# Patient Record
Sex: Male | Born: 1970 | Race: White | Hispanic: No | Marital: Single | Smoking: Current every day smoker
Health system: Southern US, Community
[De-identification: ages and names within clinical notes are randomized; demographics above are authoritative.]

## PROBLEM LIST (undated history)

## (undated) DIAGNOSIS — J45909 Unspecified asthma, uncomplicated: Secondary | ICD-10-CM

---

## 1998-03-03 ENCOUNTER — Emergency Department (HOSPITAL_COMMUNITY): Admission: EM | Admit: 1998-03-03 | Discharge: 1998-03-03 | Payer: Self-pay | Admitting: Emergency Medicine

## 2021-05-21 ENCOUNTER — Inpatient Hospital Stay (HOSPITAL_COMMUNITY)
Admission: EM | Admit: 2021-05-21 | Discharge: 2021-05-27 | DRG: 683 | Disposition: A | Discharge status: Home / Self Care | Payer: BLUE CROSS/BLUE SHIELD | Attending: Internal Medicine | Admitting: Internal Medicine

## 2021-05-21 ENCOUNTER — Encounter (HOSPITAL_COMMUNITY): Payer: Self-pay | Admitting: Emergency Medicine

## 2021-05-21 ENCOUNTER — Emergency Department (HOSPITAL_COMMUNITY): Payer: BLUE CROSS/BLUE SHIELD

## 2021-05-21 ENCOUNTER — Other Ambulatory Visit: Payer: Self-pay

## 2021-05-21 DIAGNOSIS — K5732 Diverticulitis of large intestine without perforation or abscess without bleeding: Secondary | ICD-10-CM | POA: Diagnosis present

## 2021-05-21 DIAGNOSIS — D696 Thrombocytopenia, unspecified: Secondary | ICD-10-CM | POA: Diagnosis not present

## 2021-05-21 DIAGNOSIS — Z720 Tobacco use: Secondary | ICD-10-CM | POA: Diagnosis present

## 2021-05-21 DIAGNOSIS — Z8619 Personal history of other infectious and parasitic diseases: Secondary | ICD-10-CM | POA: Diagnosis not present

## 2021-05-21 DIAGNOSIS — M899 Disorder of bone, unspecified: Secondary | ICD-10-CM | POA: Diagnosis present

## 2021-05-21 DIAGNOSIS — E860 Dehydration: Secondary | ICD-10-CM | POA: Diagnosis present

## 2021-05-21 DIAGNOSIS — R809 Proteinuria, unspecified: Secondary | ICD-10-CM | POA: Diagnosis present

## 2021-05-21 DIAGNOSIS — R1013 Epigastric pain: Secondary | ICD-10-CM

## 2021-05-21 DIAGNOSIS — K5900 Constipation, unspecified: Secondary | ICD-10-CM | POA: Diagnosis present

## 2021-05-21 DIAGNOSIS — D6959 Other secondary thrombocytopenia: Secondary | ICD-10-CM | POA: Diagnosis present

## 2021-05-21 DIAGNOSIS — D649 Anemia, unspecified: Secondary | ICD-10-CM | POA: Diagnosis present

## 2021-05-21 DIAGNOSIS — N179 Acute kidney failure, unspecified: Principal | ICD-10-CM | POA: Diagnosis present

## 2021-05-21 DIAGNOSIS — J45909 Unspecified asthma, uncomplicated: Secondary | ICD-10-CM | POA: Diagnosis present

## 2021-05-21 DIAGNOSIS — F17210 Nicotine dependence, cigarettes, uncomplicated: Secondary | ICD-10-CM | POA: Diagnosis present

## 2021-05-21 DIAGNOSIS — Z9181 History of falling: Secondary | ICD-10-CM

## 2021-05-21 DIAGNOSIS — N19 Unspecified kidney failure: Secondary | ICD-10-CM | POA: Diagnosis not present

## 2021-05-21 DIAGNOSIS — K5792 Diverticulitis of intestine, part unspecified, without perforation or abscess without bleeding: Secondary | ICD-10-CM | POA: Diagnosis present

## 2021-05-21 DIAGNOSIS — Z20822 Contact with and (suspected) exposure to covid-19: Secondary | ICD-10-CM | POA: Diagnosis present

## 2021-05-21 DIAGNOSIS — R823 Hemoglobinuria: Secondary | ICD-10-CM | POA: Diagnosis present

## 2021-05-21 DIAGNOSIS — E8809 Other disorders of plasma-protein metabolism, not elsewhere classified: Secondary | ICD-10-CM | POA: Diagnosis present

## 2021-05-21 DIAGNOSIS — D631 Anemia in chronic kidney disease: Secondary | ICD-10-CM | POA: Diagnosis not present

## 2021-05-21 DIAGNOSIS — C9000 Multiple myeloma not having achieved remission: Secondary | ICD-10-CM | POA: Diagnosis present

## 2021-05-21 DIAGNOSIS — M549 Dorsalgia, unspecified: Secondary | ICD-10-CM | POA: Diagnosis present

## 2021-05-21 DIAGNOSIS — Z7189 Other specified counseling: Secondary | ICD-10-CM

## 2021-05-21 DIAGNOSIS — M8950 Osteolysis, unspecified site: Secondary | ICD-10-CM | POA: Diagnosis not present

## 2021-05-21 HISTORY — DX: Unspecified asthma, uncomplicated: J45.909

## 2021-05-21 LAB — CBC
HCT: 36.1 % — ABNORMAL LOW (ref 39.0–52.0)
Hemoglobin: 12.6 g/dL — ABNORMAL LOW (ref 13.0–17.0)
MCH: 32.4 pg (ref 26.0–34.0)
MCHC: 34.9 g/dL (ref 30.0–36.0)
MCV: 92.8 fL (ref 80.0–100.0)
Platelets: UNDETERMINED 10*3/uL (ref 150–400)
RBC: 3.89 MIL/uL — ABNORMAL LOW (ref 4.22–5.81)
RDW: 15 % (ref 11.5–15.5)
WBC: 12.9 10*3/uL — ABNORMAL HIGH (ref 4.0–10.5)
nRBC: 0 % (ref 0.0–0.2)

## 2021-05-21 LAB — PHOSPHORUS: Phosphorus: 5.3 mg/dL — ABNORMAL HIGH (ref 2.5–4.6)

## 2021-05-21 LAB — CK: Total CK: 118 U/L (ref 49–397)

## 2021-05-21 LAB — DIFFERENTIAL
Abs Immature Granulocytes: 0.1 10*3/uL — ABNORMAL HIGH (ref 0.00–0.07)
Basophils Absolute: 0.1 10*3/uL (ref 0.0–0.1)
Basophils Relative: 1 %
Eosinophils Absolute: 0.6 10*3/uL — ABNORMAL HIGH (ref 0.0–0.5)
Eosinophils Relative: 5 %
Lymphocytes Relative: 38 %
Lymphs Abs: 4.9 10*3/uL — ABNORMAL HIGH (ref 0.7–4.0)
Monocytes Absolute: 0.4 10*3/uL (ref 0.1–1.0)
Monocytes Relative: 3 %
Neutro Abs: 6.7 10*3/uL (ref 1.7–7.7)
Neutrophils Relative %: 52 %
Promyelocytes Relative: 1 %
nRBC: 0 /100 WBC

## 2021-05-21 LAB — URINALYSIS, ROUTINE W REFLEX MICROSCOPIC
Bacteria, UA: NONE SEEN
Bilirubin Urine: NEGATIVE
Glucose, UA: NEGATIVE mg/dL
Ketones, ur: NEGATIVE mg/dL
Leukocytes,Ua: NEGATIVE
Nitrite: NEGATIVE
Protein, ur: 30 mg/dL — AB
Specific Gravity, Urine: 1.017 (ref 1.005–1.030)
pH: 6 (ref 5.0–8.0)

## 2021-05-21 LAB — HEPATIC FUNCTION PANEL
ALT: 20 U/L (ref 0–44)
AST: 16 U/L (ref 15–41)
Albumin: 2.9 g/dL — ABNORMAL LOW (ref 3.5–5.0)
Alkaline Phosphatase: 68 U/L (ref 38–126)
Bilirubin, Direct: <0.1 mg/dL (ref 0.0–0.2)
Total Bilirubin: 0.4 mg/dL (ref 0.3–1.2)
Total Protein: 5.8 g/dL — ABNORMAL LOW (ref 6.5–8.1)

## 2021-05-21 LAB — BASIC METABOLIC PANEL
Anion gap: 11 (ref 5–15)
BUN: 48 mg/dL — ABNORMAL HIGH (ref 6–20)
CO2: 24 mmol/L (ref 22–32)
Calcium: 11.9 mg/dL — ABNORMAL HIGH (ref 8.9–10.3)
Chloride: 100 mmol/L (ref 98–111)
Creatinine, Ser: 4.14 mg/dL — ABNORMAL HIGH (ref 0.61–1.24)
GFR, Estimated: 17 mL/min — ABNORMAL LOW (ref >60)
Glucose, Bld: 119 mg/dL — ABNORMAL HIGH (ref 70–99)
Potassium: 3.8 mmol/L (ref 3.5–5.1)
Sodium: 135 mmol/L (ref 135–145)

## 2021-05-21 LAB — SODIUM, URINE, RANDOM: Sodium, Ur: 65 mmol/L

## 2021-05-21 LAB — CREATININE, URINE, RANDOM: Creatinine, Urine: 71.91 mg/dL

## 2021-05-21 LAB — MAGNESIUM: Magnesium: 2.9 mg/dL — ABNORMAL HIGH (ref 1.7–2.4)

## 2021-05-21 LAB — TROPONIN I (HIGH SENSITIVITY)
Troponin I (High Sensitivity): 7 ng/L (ref <18)
Troponin I (High Sensitivity): 7 ng/L (ref <18)

## 2021-05-21 LAB — RESP PANEL BY RT-PCR (FLU A&B, COVID) ARPGX2
Influenza A by PCR: NEGATIVE
Influenza B by PCR: NEGATIVE
SARS Coronavirus 2 by RT PCR: NEGATIVE

## 2021-05-21 LAB — TSH: TSH: 1.642 u[IU]/mL (ref 0.350–4.500)

## 2021-05-21 LAB — OSMOLALITY, URINE: Osmolality, Ur: 361 mOsm/kg (ref 300–900)

## 2021-05-21 LAB — OSMOLALITY: Osmolality: 298 mOsm/kg — ABNORMAL HIGH (ref 275–295)

## 2021-05-21 LAB — LIPASE, BLOOD: Lipase: 24 U/L (ref 11–51)

## 2021-05-21 MED ORDER — SODIUM CHLORIDE 0.9 % IV BOLUS
1000.0000 mL | Freq: Once | INTRAVENOUS | Status: AC
  Administered 2021-05-21: 1000 mL via INTRAVENOUS

## 2021-05-21 MED ORDER — ALUM & MAG HYDROXIDE-SIMETH 200-200-20 MG/5ML PO SUSP
30.0000 mL | Freq: Once | ORAL | Status: AC
Start: 2021-05-21 — End: 2021-05-21
  Administered 2021-05-21: 30 mL via ORAL
  Filled 2021-05-21: qty 30

## 2021-05-21 MED ORDER — CALCITONIN (SALMON) 200 UNIT/ML IJ SOLN
4.0000 [IU]/kg | Freq: Two times a day (BID) | INTRAMUSCULAR | Status: DC
  Administered 2021-05-22 – 2021-05-23 (×3): 308 [IU] via SUBCUTANEOUS
  Filled 2021-05-21 (×3): qty 1.54

## 2021-05-21 MED ORDER — NICOTINE 14 MG/24HR TD PT24
14.0000 mg | MEDICATED_PATCH | Freq: Every day | TRANSDERMAL | Status: DC
  Administered 2021-05-22 – 2021-05-27 (×6): 14 mg via TRANSDERMAL
  Filled 2021-05-21 (×6): qty 1

## 2021-05-21 MED ORDER — SODIUM CHLORIDE 0.9 % IV SOLN
1.0000 g | Freq: Two times a day (BID) | INTRAVENOUS | Status: DC
  Administered 2021-05-22: 1 g via INTRAVENOUS
  Filled 2021-05-21: qty 10

## 2021-05-21 MED ORDER — MORPHINE SULFATE (PF) 4 MG/ML IV SOLN
4.0000 mg | Freq: Once | INTRAVENOUS | Status: AC
  Administered 2021-05-21: 4 mg via INTRAVENOUS
  Filled 2021-05-21: qty 1

## 2021-05-21 MED ORDER — LIDOCAINE VISCOUS HCL 2 % MT SOLN
15.0000 mL | Freq: Once | OROMUCOSAL | Status: AC
  Administered 2021-05-21: 15 mL via ORAL
  Filled 2021-05-21: qty 15

## 2021-05-21 MED ORDER — SODIUM CHLORIDE 0.9 % IV SOLN: INTRAVENOUS | Status: DC | PRN

## 2021-05-21 MED ORDER — METRONIDAZOLE 500 MG/100ML IV SOLN
500.0000 mg | Freq: Two times a day (BID) | INTRAVENOUS | Status: AC
Start: 2021-05-22 — End: 2021-05-26
  Administered 2021-05-22 – 2021-05-26 (×11): 500 mg via INTRAVENOUS
  Filled 2021-05-21 (×11): qty 100

## 2021-05-21 MED ORDER — FAMOTIDINE IN NACL 20-0.9 MG/50ML-% IV SOLN
20.0000 mg | Freq: Once | INTRAVENOUS | Status: AC
  Administered 2021-05-21: 20 mg via INTRAVENOUS
  Filled 2021-05-21: qty 50

## 2021-05-21 NOTE — Assessment & Plan Note (Addendum)
Rehydrate, start calcitonin for tonight ?Would benefit from further work up including for MM ?Ordered PTH PTHrp ?SPEP. UPEP ?

## 2021-05-21 NOTE — Assessment & Plan Note (Signed)
-   Spoke about importance of quitting spent 5 minutes discussing options for treatment, prior attempts at quitting, and dangers of smoking ?  ? - order nicotine patch  ? - nursing tobacco cessation protocol ? ?

## 2021-05-21 NOTE — Assessment & Plan Note (Signed)
Mild unclear if even symptomatic. ?Cover with antibiotics for tonight and continue to follow ?

## 2021-05-21 NOTE — Assessment & Plan Note (Signed)
Rehydrate with IV fluids follow electrolytes ?

## 2021-05-21 NOTE — ED Triage Notes (Signed)
Pt with multiple complaints.  Reports lower back pain, nausea, vomiting, heartburn, frequent urination with decreased amount of urine since he fell in kitchen on 2/9.  States he was looking under his truck 2 days later on 2/11 and felt something pop in R side of chest and hasn't felt right since.  States he has asthma and hasn't had to use inhalers as much since feeling the "pop".   ?

## 2021-05-21 NOTE — H&P (Signed)
Jordan King YIA:[id number] DOB: [birth year] DOA: 05/21/2021     PCP: Merryl Hacker, No   Outpatient Specialists:  NONE    Patient arrived to ER on 05/21/21 at 1818 Referred by Attending Drenda Freeze, MD   Patient coming from:    home Lives  With SO    Chief Complaint:   Chief Complaint  Patient presents with   Back Pain   Chest Pain   Urinary Frequency    HPI: Jordan King is a 51 y.o. male with medical history significant of  tobacco abuse, asthma    Presented with   multiple complaints 1 m ago he fell in a the kitchen he has been having back pain, foamy urine, blood in his urine  He does not take any regula meds No etoh  Continues to smoke Reports poor appetite and nausea Repeated heart burn has been using TUms on occasion Once tried to eat a up to 8 TUMS did not help with hear burn No blood in stool he is unsure if he lost any wight Has been constipated Has been confused    Initial COVID TEST  NEGATIVE   Lab Results  Component Value Date   [place] NEGATIVE 05/21/2021        While in ER:   Noted to have elevated Ca and AKI   Ordered  CT spine - No fracture Heterogeneous bone marrow with a 6 mm lucency involving the anterior aspect of L3 superior endplate. Findings are nonspecific and may be related to osteopenia. MRI could be considered for further evaluation.  CXR - Inferior endplate deformity in the lower thoracic spine which appears chronic in nature. Correlate to point tenderness.  CTabd/pelvis -  Equivocal faint stranding about a diverticulum involving the descending colon, which may represent mild diverticulitis  Suggestion of mild hepatic steatosis. questionable lucent lesion within the right posterior eleventh rib.  Following Medications were ordered in ER: Medications  morphine (PF) 4 MG/ML injection 4 mg (4 mg Intravenous Given 05/21/21 1952)  famotidine (PEPCID) IVPB 20 mg premix (0 mg Intravenous Stopped 05/21/21 2118)  sodium  chloride 0.9 % bolus 1,000 mL (1,000 mLs Intravenous New Bag/Given 05/21/21 1952)  alum & mag hydroxide-simeth (MAALOX/MYLANTA) 200-200-20 MG/5ML suspension 30 mL (30 mLs Oral Given 05/21/21 1953)    And  lidocaine (XYLOCAINE) 2 % viscous mouth solution 15 mL (15 mLs Oral Given 05/21/21 1953)    _______________________________________________________ ER Provider Called:  Nephrology   Dr.Sanford They Recommend admit to medicine   Will see in AM    ED Triage Vitals  Enc Vitals Group     BP 05/21/21 1827 117/69     Pulse Rate 05/21/21 1827 93     Resp 05/21/21 1827 18     Temp 05/21/21 1827 98.7 F (37.1 C)     Temp Source 05/21/21 1827 Oral     SpO2 05/21/21 1827 99 %     Weight 05/21/21 1855 170 lb (77.1 kg)     Height 05/21/21 1855 '5\' 8"'$  (1.727 m)     Head Circumference --      Peak Flow --      Pain Score 05/21/21 1834 9     Pain Loc --      Pain Edu? --      Excl. in [place]? --   TMAX(24)@     _________________________________________ Significant initial  Findings: Abnormal Labs Reviewed  BASIC METABOLIC PANEL - Abnormal; Notable for the following components:  Result Value   Glucose, Bld 119 (*)    BUN 48 (*)    Creatinine, Ser 4.14 (*)    Calcium 11.9 (*)    GFR, Estimated 17 (*)    All other components within normal limits  CBC - Abnormal; Notable for the following components:   WBC 12.9 (*)    RBC 3.89 (*)    Hemoglobin 12.6 (*)    HCT 36.1 (*)    All other components within normal limits  URINALYSIS, ROUTINE W REFLEX MICROSCOPIC - Abnormal; Notable for the following components:   Hgb urine dipstick MODERATE (*)    Protein, ur 30 (*)    All other components within normal limits  HEPATIC FUNCTION PANEL - Abnormal; Notable for the following components:   Total Protein 5.8 (*)    Albumin 2.9 (*)    All other components within normal limits     _________________________ Troponin 7 ECG: Ordered Personally reviewed by me showing: HR : 86 Rhythm:  NSR    no  evidence of ischemic changes QTC 425    The recent clinical data is shown below. Vitals:   05/21/21 1827 05/21/21 1855 05/21/21 1930 05/21/21 2000  BP: 117/69  115/69 116/70  Pulse: 93  81 81  Resp: '18  15 18  '$ Temp: 98.7 F (37.1 C)     TempSrc: Oral     SpO2: 99%  97% 99%  Weight:  77.1 kg    Height:  '5\' 8"'$  (1.727 m)      WBC     Component Value Date/Time   WBC 12.9 (H) 05/21/2021 1836       UA  no evidence of UTI   elevated HG   Urine analysis:    Component Value Date/Time   COLORURINE YELLOW 05/21/2021 1850   APPEARANCEUR CLEAR 05/21/2021 1850   LABSPEC 1.017 05/21/2021 1850   PHURINE 6.0 05/21/2021 1850   GLUCOSEU NEGATIVE 05/21/2021 1850   HGBUR MODERATE (A) 05/21/2021 1850   BILIRUBINUR NEGATIVE 05/21/2021 [place] NEGATIVE 05/21/2021 1850   PROTEINUR 30 (A) 05/21/2021 1850   NITRITE NEGATIVE 05/21/2021 1850   LEUKOCYTESUR NEGATIVE 05/21/2021 1850    No results found for this or any previous visit.   _______________________________________________ Hospitalist was called for admission for aki  The following Work up has been ordered so far:  Orders Placed This Encounter  Procedures   DG Chest 2 View   CT L-SPINE NO CHARGE   CT ABDOMEN PELVIS WO CONTRAST   Basic metabolic panel   CBC   Urinalysis, Routine w reflex microscopic   Hepatic function panel   CK   Lipase, blood   TSH   Document Height and Actual Weight   Consult for [facility] Admission   ED EKG   EKG 12-Lead     OTHER Significant initial  Findings:  labs showing:    Recent Labs  Lab 05/21/21 1836  NA 135  K 3.8  CO2 24  GLUCOSE 119*  BUN 48*  CREATININE 4.14*  CALCIUM 11.9*    Cr  Up from baseline see below Lab Results  Component Value Date   CREATININE 4.14 (H) 05/21/2021    Recent Labs  Lab 05/21/21 1920  AST 16  ALT 20  ALKPHOS 68  BILITOT 0.4  PROT 5.8*  ALBUMIN 2.9*   Lab Results  Component Value Date   CALCIUM 11.9 (H)  05/21/2021       Plt: Lab Results  Component Value Date  PLT PLATELET CLUMPS NOTED ON SMEAR, UNABLE TO ESTIMATE 05/21/2021        Recent Labs  Lab 05/21/21 1836  WBC 12.9*  HGB 12.6*  HCT 36.1*  MCV 92.8  PLT PLATELET CLUMPS NOTED ON SMEAR, UNABLE TO ESTIMATE    HG/HCT  stable,      Component Value Date/Time   HGB 12.6 (L) 05/21/2021 1836   HCT 36.1 (L) 05/21/2021 1836   MCV 92.8 05/21/2021 1836     Recent Labs  Lab 05/21/21 1920  LIPASE 24   No results for input(s): AMMONIA in the last 168 hours.    Cardiac Panel (last 3 results) Recent Labs    05/21/21 1920  CKTOTAL 118          Cultures: No results found for: SDES, SPECREQUEST, CULT, REPTSTATUS   Radiological Exams on Admission: CT ABDOMEN PELVIS WO CONTRAST  Result Date: 05/21/2021 CLINICAL DATA:  Flank and abdominal pain. Renal failure. Patient reports low back pain with nausea, vomiting, in frequent urination since fall in the kitchen on February 9th. EXAM: CT ABDOMEN AND PELVIS WITHOUT CONTRAST TECHNIQUE: Multidetector CT imaging of the abdomen and pelvis was performed following the standard protocol without IV contrast. RADIATION DOSE REDUCTION: This exam was performed according to the departmental dose-optimization program which includes automated exposure control, adjustment of the mA and/or kV according to patient size and/or use of iterative reconstruction technique. COMPARISON:  None. FINDINGS: Lower chest: Clear lung bases without focal airspace disease or pleural effusion. The heart is normal in size. Hepatobiliary: Suggestion of mild hepatic steatosis. There is no focal liver abnormality on this unenhanced exam. Decompressed gallbladder without calcified gallstone. No biliary dilatation. Pancreas: No ductal dilatation or inflammation. Spleen: Upper normal in size spanning 11.7 x 6.7 x 12.4 cm (volume = 510 cm^3). No focal splenic abnormality or perisplenic fluid. Adrenals/Urinary Tract: Normal adrenal  glands. No hydronephrosis or renal calculi. No perinephric edema. No evidence of focal renal lesion. Decompressed ureters. No ureteral stone. Unremarkable urinary bladder. Stomach/Bowel: Decompressed stomach. Occasional fluid-filled small bowel without obstruction, inflammation or abnormal distention. Normal appendix. Diverticulosis from the distal transverse colon distally. There is equivocal faint stranding about a diverticulum involving the descending colon, series 3, image 46. No other diverticulitis or acute colonic inflammation Vascular/Lymphatic: Moderate aortic atherosclerosis, advanced for age. No portal venous or mesenteric gas. No bulky abdominopelvic adenopathy. Reproductive: Prostate is unremarkable. Other: No free air or free fluid. There is a small fat containing umbilical hernia. Musculoskeletal: Lumbar spine assessed on lumbar spine reformats, reported separately. Heterogeneous marrow of the bony pelvis. Questionable lucent lesion within the right posterior lower eleventh rib, series 3, images 21 and 22 no fracture. Included musculature is unremarkable. IMPRESSION: 1. No renal stones or obstructive uropathy. 2. Equivocal faint stranding about a diverticulum involving the descending colon, which may represent mild diverticulitis. Multiple additional colonic diverticula from the splenic flexure distally. 3. Suggestion of mild hepatic steatosis. 4. Small fat containing umbilical hernia. 5. There is a questionable lucent lesion within the right posterior eleventh rib. This is nonspecific. Consider elective bone scan for further assessment Aortic Atherosclerosis (ICD10-I70.0). Electronically Signed   By: [name] Rake M.D.   On: 05/21/2021 20:34   DG Chest 2 View  Result Date: 05/21/2021 CLINICAL DATA:  Chest pain, history of prior rib fracture, initial encounter EXAM: CHEST - 2 VIEW COMPARISON:  None. FINDINGS: Cardiac shadow is within normal limits. Aortic calcifications are noted. The lungs are  well aerated without focal infiltrate, effusion  or pneumothorax. No discrete rib fracture is noted. Mild inferior endplate deformity is noted in the lower thoracic spine but appears chronic in nature. Correlate to point tenderness. No other focal abnormality is noted. IMPRESSION: No discrete rib fracture is noted.  No pneumothorax is seen. Inferior endplate deformity in the lower thoracic spine which appears chronic in nature. Correlate to point tenderness. No other focal abnormality is noted. Electronically Signed   By: Inez Catalina M.D.   On: 05/21/2021 19:27   CT L-SPINE NO CHARGE  Result Date: 05/21/2021 CLINICAL DATA:  Low back pain. EXAM: CT LUMBAR SPINE WITHOUT CONTRAST TECHNIQUE: Multidetector CT imaging of the lumbar spine was performed without intravenous contrast administration. Multiplanar CT image reconstructions were also generated. RADIATION DOSE REDUCTION: This exam was performed according to the departmental dose-optimization program which includes automated exposure control, adjustment of the mA and/or kV according to patient size and/or use of iterative reconstruction technique. COMPARISON:  None. FINDINGS: Segmentation: 5 lumbar type vertebrae. Alignment: Normal. Vertebrae: No fracture. There is 6 mm lucency involving the anterior aspect of L3 superior endplate. Background bone marrow is heterogeneous. Paraspinal and other soft tissues: Assessed on concurrent abdominopelvic CT, reported separately. Disc levels: Minor disc bulge at L4-L5 and L5-S1. No spinal canal or neural foraminal narrowing. IMPRESSION: 1. No fracture or subluxation of the lumbar spine. 2. Heterogeneous bone marrow with a 6 mm lucency involving the anterior aspect of L3 superior endplate. Findings are nonspecific and may be related to osteopenia. MRI could be considered for further evaluation. 3. Minor disc bulge at L4-L5 and L5-S1. No spinal canal or neural foraminal narrowing. Electronically Signed   By: [name] Rake  M.D.   On: 05/21/2021 20:39   _______________________________________________________________________________________________________ Latest   Blood pressure 116/70, pulse 81, temperature 98.7 F (37.1 C), temperature source Oral, resp. rate 18, height '5\' 8"'$  (1.727 m), weight 77.1 kg, SpO2 99 %.   Vitals  labs and radiology finding personally reviewed  Review of Systems:    Pertinent positives include:  fatigue, back pain.   Constitutional:  No weight loss, night sweats, Fevers, chills,  weight loss  HEENT:  No headaches, Difficulty swallowing,Tooth/dental problems,Sore throat,  No sneezing, itching, ear ache, nasal congestion, post nasal drip,  Cardio-vascular:  No chest pain, Orthopnea, PND, anasarca, dizziness, palpitations.no Bilateral lower extremity swelling  GI:  No heartburn, indigestion, abdominal pain, nausea, vomiting, diarrhea, change in bowel habits, loss of appetite, melena, blood in stool, hematemesis Resp:  no shortness of breath at rest. No dyspnea on exertion, No excess mucus, no productive cough, No non-productive cough, No coughing up of blood.No change in color of mucus.No wheezing. Skin:  no rash or lesions. No jaundice GU:  no dysuria, change in color of urine, no urgency or frequency. No straining to urinate.  No flank pain.  Musculoskeletal:  No joint pain or no joint swelling. No decreased range of motion. No Psych:  No change in mood or affect. No depression or anxiety. No memory loss.  Neuro: no localizing neurological complaints, no tingling, no weakness, no double vision, no gait abnormality, no slurred speech, no confusion  All systems reviewed and apart from [place] all are negative _______________________________________________________________________________________________ Past Medical History:   Past Medical History:  Diagnosis Date   Asthma      History reviewed. No pertinent surgical history.  Social History:  Ambulatory    independently       reports that he has been smoking cigarettes. He has never used smokeless tobacco.  He reports that he does not currently use alcohol. He reports that he does not currently use drugs.     Family History: no kidney disease,  prostate cancer bladder cancer, lung cancer, Parkinson disease ______________________________________________________________________________________________ Allergies: Not on File   Prior to Admission medications   Not on File    ___________________________________________________________________________________________________ Physical Exam: Vitals with BMI 05/21/2021 05/21/2021 05/21/2021  Height - - '5\' 8"'$   Weight - - 170 lbs  BMI - - 09.23  Systolic 300 762 -  Diastolic 70 69 -  Pulse 81 81 -     1. General:  in No  Acute distress   Chronically ill   -appearing 2. Psychological: Alert and  Oriented 3. Head/ENT:   Dry Mucous Membranes                          Head Non traumatic, neck supple                         Poor Dentition 4. SKIN:  decreased Skin turgor,  Skin clean Dry and intact no rash 5. Heart: Regular rate and rhythm no  Murmur, no Rub or gallop 6. Lungs:  Clear to auscultation bilaterally, no wheezes or crackles   7. Abdomen: Soft,  non-tender, Non distended   obese  bowel sounds present 8. Lower extremities: no clubbing, cyanosis, no  edema 9. Neurologically Grossly intact, moving all 4 extremities equally   10. MSK: Normal range of motion    Chart has been reviewed  ______________________________________________________________________________________________  Assessment/Plan 51 y.o. male with medical history significant of  tobacco abuse, asthma   Admitted for AKI  Present on Admission:  AKI (acute kidney injury) ([place])  Hypercalcemia  Lytic bone lesions on xray  Diverticulitis  Dehydration  Tobacco abuse     Hypercalcemia Rehydrate, start calcitonin for tonight Would benefit from further work up  including for MM Ordered PTH PTHrp SPEP. UPEP  AKI (acute kidney injury) ([place]) Order urine electrolytes rehydrate appreciate nephrology consult given lytic lesions and possibility order SPEP UPEP if positive would benefit from hematology consult and follow-up Given protein in urine and high pool albuminemia worrisome for nephrotic syndrome  Lytic bone lesions on xray Will need further work-up such as bone scan this can be done in the future and will need further follow-up Pain control May need further imaging  MRI unable to get with contrast at this time due to AKI No weakness of bowel incontinence  Diverticulitis Mild unclear if even symptomatic. Cover with antibiotics for tonight and continue to follow  Dehydration Rehydrate with IV fluids follow electrolytes  Tobacco abuse  - Spoke about importance of quitting spent 5 minutes discussing options for treatment, prior attempts at quitting, and dangers of smoking    - order nicotine patch   - nursing tobacco cessation protocol    Other plan as per orders.  DVT prophylaxis:  SCD     Code Status:    Code Status: Not on file FULL CODE  as per patient  I had personally discussed CODE STATUS with patient      Family Communication:   Family   at  Bedside  plan of care was discussed on the phone with  mother  Disposition Plan:       To home once workup is complete and patient is stable   Following barriers for discharge:  Electrolytes corrected                                                     Will need consultants to evaluate patient prior to discharge                        Would benefit from PT/OT eval prior to DC  Ordered                     Consults called: nephrology is aware, emailed oncology   Admission status:  ED Disposition     ED Disposition  [place]: [facility] [[id number]]  Level of Care: Telemetry Medical [[id number]]  May  admit patient to [facility] or [facility] if equivalent level of care is available:: No  Covid Evaluation: Asymptomatic Screening Protocol (No Symptoms)  Diagnosis: AKI (acute kidney injury) [facility]) [[id number]]  Admitting Physician: Toy Baker [[id number]]  Attending Physician: Toy Baker [[id number]]  Estimated length of stay: past midnight tomorrow  Certification:: I certify this patient will need inpatient services for at least 2 midnights          inpatient     I Expect 2 midnight stay secondary to severity of patient's current illness need for inpatient interventions justified by the following:    Severe lab/radiological/exam abnormalities including:    AKI, hypecalcemia    That are currently affecting medical management.   I expect  patient to be hospitalized for 2 midnights requiring inpatient medical care.  Patient is at high risk for adverse outcome (such as loss of life or disability) if not treated.  Indication for inpatient stay as follows:    Need for operative/procedural  intervention     Need for IV antibiotics, IV fluids, IV rate controling medications, IV antihypertensives, IV pain medications, IV anticoagulation, need for biPAP    Level of care     tele  For   24H     No results found for: [id number]   Precautions: admitted as   Covid Negative    Aldena Worm 05/21/2021, 10:41 PM    [facility]     after 2 AM please page floor coverage PA If 7AM-7PM, please contact the day team taking care of the patient using [email or web address]   Patient was evaluated in the context of the global COVID-19 pandemic, which necessitated consideration that the patient might be at risk for infection with the SARS-CoV-2 virus that causes COVID-19. Institutional protocols and algorithms that pertain to the evaluation of patients at risk for COVID-19 are in a state of rapid change based on information released by regulatory bodies including the CDC and federal and  state organizations. These policies and algorithms were followed during the patient's care.

## 2021-05-21 NOTE — Assessment & Plan Note (Addendum)
Order urine electrolytes rehydrate appreciate nephrology consult given lytic lesions and possibility order SPEP UPEP if positive would benefit from hematology consult and follow-up ?Given protein in urine and high pool albuminemia worrisome for nephrotic syndrome ?

## 2021-05-21 NOTE — Assessment & Plan Note (Addendum)
Will need further work-up such as bone scan this can be done in the future and will need further follow-up ?Pain control ?May need further imaging  ?MRI unable to get with contrast at this time due to AKI ?No weakness of bowel incontinence ?

## 2021-05-21 NOTE — Subjective & Objective (Signed)
1 m ago he fell in a the kitchen he has been having back pain, foamy urine, blood in his urine ?

## 2021-05-21 NOTE — ED Provider Notes (Signed)
?[facility] ?Provider Note ? ? ?CSN: [id number] ?Arrival date & time: 05/21/21  1818 ? ?  ? ?History ? ?Chief Complaint  ?Patient presents with  ? Back Pain  ? Chest Pain  ? Urinary Frequency  ? ? ?Jordan King is a 51 y.o. male here presenting with chest pain and back pain and urinary frequency multiple complaints.  Patient states that it started about a month ago.  He states that he fell in the kitchen on February 9.  He then had some right-sided chest pain.  Patient also had felt a pop in his back as well.  He states that his back has been persistently hurting him.  He also noticed blood in his urine and his urine is more foamy or than usual.  He states he that he also has poor appetite and every time he eats he feels nauseated and he has been throwing up a lot.  He also has severe heartburn and acid taste in his mouth.  Patient has been taking Tylenol and Motrin for pain and has not been feeling well. ? ?The history is provided by the patient.  ? ?  ? ?Home Medications ?Prior to Admission medications   ?Not on File  ?   ? ?Allergies    ?Patient has no allergy information on record.   ? ?Review of Systems   ?Review of Systems  ?Cardiovascular:  Positive for chest pain.  ?Gastrointestinal:  Positive for abdominal pain, nausea and vomiting.  ?Genitourinary:  Positive for frequency.  ?Musculoskeletal:  Positive for back pain.  ?All other systems reviewed and are negative. ? ?Physical Exam ?Updated Vital Signs ?BP 117/69 (BP Location: Right Arm)   Pulse 93   Temp 98.7 ?F (37.1 ?C) (Oral)   Resp 18   Ht 5' 8"  (1.727 m)   Wt 77.1 kg   SpO2 99%   BMI 25.85 kg/m?  ?Physical Exam ?Vitals and nursing note reviewed.  ?Constitutional:   ?   Comments: Chronically ill appearing  ?HENT:  ?   Head: Normocephalic.  ?Eyes:  ?   Pupils: Pupils are equal, round, and reactive to light.  ?Cardiovascular:  ?   Rate and Rhythm: Normal rate and regular rhythm.  ?   Heart sounds: Normal heart  sounds.  ?Pulmonary:  ?   Effort: Pulmonary effort is normal.  ?   Breath sounds: Normal breath sounds.  ?   Comments: Reproducible right chest wall tenderness ?Abdominal:  ?   General: Bowel sounds are normal.  ?   Palpations: Abdomen is soft.  ?   Comments: Mild epigastric tenderness and patient also has lower lumbar tenderness  ?Musculoskeletal:  ?   Cervical back: Normal range of motion and neck supple.  ?Skin: ?   General: Skin is warm.  ?   Capillary Refill: Capillary refill takes less than 2 seconds.  ?Neurological:  ?   General: No focal deficit present.  ?   Mental Status: He is oriented to person, place, and time.  ?Psychiatric:     ?   Mood and Affect: Mood normal.     ?   Behavior: Behavior normal.  ? ? ?ED Results / Procedures / Treatments   ?Labs ?(all labs ordered are listed, but only abnormal results are displayed) ?Labs Reviewed  ?BASIC METABOLIC PANEL - Abnormal; Notable for the following components:  ?    Result Value  ? Glucose, Bld 119 (*)   ? BUN 48 (*)   ?  Creatinine, Ser 4.14 (*)   ? Calcium 11.9 (*)   ? GFR, Estimated 17 (*)   ? All other components within normal limits  ?CBC - Abnormal; Notable for the following components:  ? WBC 12.9 (*)   ? RBC 3.89 (*)   ? Hemoglobin 12.6 (*)   ? HCT 36.1 (*)   ? All other components within normal limits  ?URINALYSIS, ROUTINE W REFLEX MICROSCOPIC - Abnormal; Notable for the following components:  ? Hgb urine dipstick MODERATE (*)   ? Protein, ur 30 (*)   ? All other components within normal limits  ?HEPATIC FUNCTION PANEL  ?CK  ?LIPASE, BLOOD  ?TSH  ?TROPONIN I (HIGH SENSITIVITY)  ? ? ?EKG ?EKG Interpretation ? ?Date/Time:  Saturday May 21 2021 18:53:15 EST ?Ventricular Rate:  86 ?PR Interval:  139 ?QRS Duration: 89 ?QT Interval:  355 ?QTC Calculation: 425 ?R Axis:   74 ?Text Interpretation: Sinus rhythm No previous ECGs available Confirmed by Wandra Arthurs [phone]) on 05/21/2021 7:08:15 PM ? ?Radiology ?DG Chest 2 View ? ?Result Date: 05/21/2021 ?CLINICAL  DATA:  Chest pain, history of prior rib fracture, initial encounter EXAM: CHEST - 2 VIEW COMPARISON:  None. FINDINGS: Cardiac shadow is within normal limits. Aortic calcifications are noted. The lungs are well aerated without focal infiltrate, effusion or pneumothorax. No discrete rib fracture is noted. Mild inferior endplate deformity is noted in the lower thoracic spine but appears chronic in nature. Correlate to point tenderness. No other focal abnormality is noted. IMPRESSION: No discrete rib fracture is noted.  No pneumothorax is seen. Inferior endplate deformity in the lower thoracic spine which appears chronic in nature. Correlate to point tenderness. No other focal abnormality is noted. Electronically Signed   By: Inez Catalina M.D.   On: 05/21/2021 19:27   ? ?Procedures ?Procedures  ? ? ?CRITICAL CARE ?Performed by: Wandra Arthurs ? ? ?Total critical care time: 30 minutes ? ?Critical care time was exclusive of separately billable procedures and treating other patients. ? ?Critical care was necessary to treat or prevent imminent or life-threatening deterioration. ? ?Critical care was time spent personally by me on the following activities: development of treatment plan with patient and/or surrogate as well as nursing, discussions with consultants, evaluation of patient's response to treatment, examination of patient, obtaining history from patient or surrogate, ordering and performing treatments and interventions, ordering and review of laboratory studies, ordering and review of radiographic studies, pulse oximetry and re-evaluation of patient's condition. ? ? ?Medications Ordered in ED ?Medications  ?morphine (PF) 4 MG/ML injection 4 mg (has no administration in time range)  ?famotidine (PEPCID) IVPB 20 mg premix (has no administration in time range)  ?sodium chloride 0.9 % bolus 1,000 mL (has no administration in time range)  ?alum & mag hydroxide-simeth (MAALOX/MYLANTA) 200-200-20 MG/5ML suspension 30 mL (has  no administration in time range)  ?  And  ?lidocaine (XYLOCAINE) 2 % viscous mouth solution 15 mL (has no administration in time range)  ? ? ?ED Course/ Medical Decision Making/ A&P ?  ?                        ?Medical Decision Making ?THINH CUCCARO is a 51 y.o. male here presenting with nausea vomiting and heartburn and decreased urination for the last month or so.  Patient did have a fall about a month ago.  Consider hypothyroidism versus electrolyte abnormalities versus intra-abdominal mass versus renal failure.  Plan to  get CBC and CMP and CT abdomen pelvis and CT lumbar spine. We will hydrate patient and reassess ? ?9:18 PM ?Labs showed renal failure. Calcium elevated at 11.9. CT showed possible lytic lesions. Concerned for possible multiple myeloma. Called Dr. Joelyn Oms from nephrology to see patient. Hospitalist to admit for renal failure, workup for possible myeloma.  ? ?Problems Addressed: ?Bone lesion: acute illness or injury ?Hypercalcemia: acute illness or injury ?Renal failure, unspecified chronicity: undiagnosed new problem with uncertain prognosis ? ?Amount and/or Complexity of Data Reviewed ?Labs: ordered. Decision-making details documented in ED Course. ?Radiology: ordered and independent interpretation performed. Decision-making details documented in ED Course. ?ECG/medicine tests: ordered and independent interpretation performed. Decision-making details documented in ED Course. ? ?Risk ?OTC drugs. ?Prescription drug management. ?Decision regarding hospitalization. ? ?Final Clinical Impression(s) / ED Diagnoses ?Final diagnoses:  ?None  ? ? ?Rx / DC Orders ?ED Discharge Orders   ? ? None  ? ?  ? ? ?  ?Drenda Freeze, MD ?05/21/21 2129 ? ?

## 2021-05-22 DIAGNOSIS — D696 Thrombocytopenia, unspecified: Secondary | ICD-10-CM

## 2021-05-22 DIAGNOSIS — D649 Anemia, unspecified: Secondary | ICD-10-CM

## 2021-05-22 DIAGNOSIS — N179 Acute kidney failure, unspecified: Secondary | ICD-10-CM | POA: Diagnosis not present

## 2021-05-22 DIAGNOSIS — M899 Disorder of bone, unspecified: Secondary | ICD-10-CM | POA: Diagnosis not present

## 2021-05-22 DIAGNOSIS — K5792 Diverticulitis of intestine, part unspecified, without perforation or abscess without bleeding: Secondary | ICD-10-CM | POA: Diagnosis not present

## 2021-05-22 LAB — COMPREHENSIVE METABOLIC PANEL
ALT: 18 U/L (ref 0–44)
AST: 14 U/L — ABNORMAL LOW (ref 15–41)
Albumin: 2.6 g/dL — ABNORMAL LOW (ref 3.5–5.0)
Alkaline Phosphatase: 59 U/L (ref 38–126)
Anion gap: 10 (ref 5–15)
BUN: 48 mg/dL — ABNORMAL HIGH (ref 6–20)
CO2: 21 mmol/L — ABNORMAL LOW (ref 22–32)
Calcium: 11.5 mg/dL — ABNORMAL HIGH (ref 8.9–10.3)
Chloride: 104 mmol/L (ref 98–111)
Creatinine, Ser: 3.92 mg/dL — ABNORMAL HIGH (ref 0.61–1.24)
GFR, Estimated: 18 mL/min — ABNORMAL LOW (ref >60)
Glucose, Bld: 100 mg/dL — ABNORMAL HIGH (ref 70–99)
Potassium: 4.1 mmol/L (ref 3.5–5.1)
Sodium: 135 mmol/L (ref 135–145)
Total Bilirubin: 0.4 mg/dL (ref 0.3–1.2)
Total Protein: 5.3 g/dL — ABNORMAL LOW (ref 6.5–8.1)

## 2021-05-22 LAB — PREALBUMIN: Prealbumin: 18.4 mg/dL (ref 18–38)

## 2021-05-22 LAB — CBC WITH DIFFERENTIAL/PLATELET
Abs Immature Granulocytes: 0.22 10*3/uL — ABNORMAL HIGH (ref 0.00–0.07)
Basophils Absolute: 0.1 10*3/uL (ref 0.0–0.1)
Basophils Relative: 1 %
Eosinophils Absolute: 0.3 10*3/uL (ref 0.0–0.5)
Eosinophils Relative: 3 %
HCT: 31.5 % — ABNORMAL LOW (ref 39.0–52.0)
Hemoglobin: 11.3 g/dL — ABNORMAL LOW (ref 13.0–17.0)
Immature Granulocytes: 2 %
Lymphocytes Relative: 38 %
Lymphs Abs: 4 10*3/uL (ref 0.7–4.0)
MCH: 33.2 pg (ref 26.0–34.0)
MCHC: 35.9 g/dL (ref 30.0–36.0)
MCV: 92.6 fL (ref 80.0–100.0)
Monocytes Absolute: 1.8 10*3/uL — ABNORMAL HIGH (ref 0.1–1.0)
Monocytes Relative: 17 %
Neutro Abs: 4 10*3/uL (ref 1.7–7.7)
Neutrophils Relative %: 39 %
Platelets: 29 10*3/uL — CL (ref 150–400)
RBC: 3.4 MIL/uL — ABNORMAL LOW (ref 4.22–5.81)
RDW: 15.4 % (ref 11.5–15.5)
WBC: 10.4 10*3/uL (ref 4.0–10.5)
nRBC: 0 % (ref 0.0–0.2)

## 2021-05-22 LAB — CALCIUM
Calcium: 11.5 mg/dL — ABNORMAL HIGH (ref 8.9–10.3)
Calcium: 11.2 mg/dL — ABNORMAL HIGH (ref 8.9–10.3)
Calcium: 10.2 mg/dL (ref 8.9–10.3)

## 2021-05-22 LAB — PROTIME-INR
INR: 1.1 (ref 0.8–1.2)
Prothrombin Time: 14.4 seconds (ref 11.4–15.2)

## 2021-05-22 LAB — HIV ANTIBODY (ROUTINE TESTING W REFLEX): HIV Screen 4th Generation wRfx: NONREACTIVE

## 2021-05-22 LAB — MAGNESIUM: Magnesium: 2.9 mg/dL — ABNORMAL HIGH (ref 1.7–2.4)

## 2021-05-22 LAB — PHOSPHORUS: Phosphorus: 5.7 mg/dL — ABNORMAL HIGH (ref 2.5–4.6)

## 2021-05-22 MED ORDER — SODIUM CHLORIDE 0.9 % IV SOLN
2.0000 g | INTRAVENOUS | Status: AC
  Administered 2021-05-22 – 2021-05-26 (×5): 2 g via INTRAVENOUS
  Filled 2021-05-22 (×5): qty 20

## 2021-05-22 MED ORDER — DEXAMETHASONE 6 MG PO TABS
40.0000 mg | ORAL_TABLET | Freq: Once | ORAL | Status: AC
  Administered 2021-05-22: 40 mg via ORAL
  Filled 2021-05-22: qty 1

## 2021-05-22 MED ORDER — SODIUM CHLORIDE 0.9 % IV SOLN: INTRAVENOUS | Status: AC

## 2021-05-22 MED ORDER — HYDROCODONE-ACETAMINOPHEN 5-325 MG PO TABS
1.0000 | ORAL_TABLET | ORAL | Status: DC | PRN
  Administered 2021-05-22 – 2021-05-24 (×3): 2 via ORAL
  Administered 2021-05-25: 1 via ORAL
  Administered 2021-05-25: 2 via ORAL
  Administered 2021-05-26: 1 via ORAL
  Administered 2021-05-26: 2 via ORAL
  Filled 2021-05-22 (×7): qty 2

## 2021-05-22 MED ORDER — ONDANSETRON HCL 4 MG/2ML IJ SOLN
4.0000 mg | Freq: Four times a day (QID) | INTRAMUSCULAR | Status: DC | PRN
  Administered 2021-05-22 (×3): 4 mg via INTRAVENOUS
  Filled 2021-05-22 (×3): qty 2

## 2021-05-22 MED ORDER — ACETAMINOPHEN 325 MG PO TABS: 650.0000 mg | ORAL_TABLET | Freq: Four times a day (QID) | ORAL | Status: DC | PRN

## 2021-05-22 MED ORDER — SODIUM CHLORIDE 0.9 % IV SOLN
60.0000 mg | Freq: Once | INTRAVENOUS | Status: AC
  Administered 2021-05-22: 60 mg via INTRAVENOUS
  Filled 2021-05-22: qty 20
  Filled 2021-05-22: qty 6.67

## 2021-05-22 MED ORDER — ACETAMINOPHEN 650 MG RE SUPP: 650.0000 mg | Freq: Four times a day (QID) | RECTAL | Status: DC | PRN

## 2021-05-22 MED ORDER — SODIUM CHLORIDE 0.9 % IV SOLN
12.5000 mg | Freq: Once | INTRAVENOUS | Status: AC
  Administered 2021-05-22: 12.5 mg via INTRAVENOUS
  Filled 2021-05-22: qty 12.5

## 2021-05-22 MED ORDER — FAMOTIDINE 20 MG PO TABS
20.0000 mg | ORAL_TABLET | Freq: Every day | ORAL | Status: DC
  Administered 2021-05-22 – 2021-05-24 (×3): 20 mg via ORAL
  Filled 2021-05-22 (×3): qty 1

## 2021-05-22 NOTE — Progress Notes (Signed)
Lab does not have anymore 24 hour urine container/kits. RN will inform day shift to see if they can obtain one.  ? ?Jordan King ? ?

## 2021-05-22 NOTE — Consult Note (Signed)
Jordan King ?Admit Date: 05/21/2021 ?05/22/2021 ?Rexene Agent ?Requesting Physician:  Darl Householder MD ? ?Reason for Consult:  Renal Failure, Hypercalcemia ?HPI:  ?57M presented to the ED yesterday with back pain and fatigue.  He had a fall 2 to 3 weeks ago onto his back with persistent pain thereafter.  He struggled with some dyspepsia additionally and for a few days, a few weeks ago, took numerous Tums pills but not really any since.  He also took some ibuprofen during that time once again not much since.  He presented with persistent drowsiness and malaise. ? ?In the ED he was found to have renal failure with a creatinine of 4.1, and hypercalcemia with a value of 11.9 and an albumin of 2.9; anemia and thrombocytopenia; and CT of the abdomen and pelvis with a questionable lucent lesion in the right posterior 11th rib. ? ?He was admitted, placed on normal saline, given calcitonin.  In addition to nephrology, oncology has been consulted.  They have already seen the patient and plan to add bisphosphonate, complete myeloma work-up, and arrange bone marrow biopsy. ? ?No previous renal function testing is available.  He is not aware of known kidney disease.  He had 1 day of cloudy urine but no tea colored urine or consistently foamy urine.  Urine analysis at presentation with trace proteinuria and moderate hemoglobinuria but microscopy does not indicate number of erythrocytes per high-powered field.  SPEP, serum free light chains, PTH, PTH RP are pending. ? ?No obvious / clear uremic Sx at this time ? ?PMH Incudes: ?Asthma ?Tobacco user ? ? ?Creatinine, Ser (mg/dL)  ?Date Value  ?05/22/2021 3.92 (H)  ?05/21/2021 4.14 (H)  ?] ?I/Os: ?I/O last 3 completed shifts: ?In: 200 [IV [id number]:606] ?Out: 120 [Urine:120]  ? ?ROS ?NSAIDS: No significant use ?IV Contrast no identified exposure ?TMP/SMX no exposure ?Hypotension not present ?Balance of 12 systems is negative w/ exceptions as above ? ?PMH  ?Past Medical History:  ?Diagnosis  Date  ? Asthma   ? ?[place] History reviewed. No pertinent surgical history. ?[place] History reviewed. No pertinent family history. ?[place]  reports that he has been smoking cigarettes. He has never used smokeless tobacco. He reports that he does not currently use alcohol. He reports that he does not currently use drugs. ?Allergies No Known Allergies ?Home medications ?Prior to Admission medications   ?Medication Sig Start Date End Date Taking? Authorizing Provider  ?acetaminophen (TYLENOL) 500 MG tablet Take 1,000 mg by mouth every 6 (six) hours as needed for moderate pain or headache.   Yes [provider]  ?albuterol (VENTOLIN HFA) 108 (90 Base) MCG/ACT inhaler Inhale 2 puffs into the lungs every 6 (six) hours as needed for wheezing or shortness of breath.   Yes [provider]  ?bismuth subsalicylate (PEPTO BISMOL) 262 MG/15ML suspension Take 30 mLs by mouth every 6 (six) hours as needed for diarrhea or loose stools or indigestion.   Yes [provider]  ?calcium carbonate (TUMS EX) 750 MG chewable tablet Chew 2 tablets by mouth every 4 (four) hours as needed for heartburn.   Yes [provider]  ?cyclobenzaprine (FLEXERIL) 10 MG tablet Take 10 mg by mouth 3 (three) times daily as needed for muscle spasms.   Yes [provider]  ?ibuprofen (ADVIL) 200 MG tablet Take 400 mg by mouth every 6 (six) hours as needed for moderate pain or headache.   Yes [provider]  ? ? ?Current Medications ?Scheduled Meds: ? calcitonin  4  Units/kg Subcutaneous BID  ? dexamethasone  40 mg Oral Once  ? famotidine  20 mg Oral Daily  ? nicotine  14 mg Transdermal Daily  ? ?Continuous Infusions: ? sodium chloride 100 mL/hr at 05/22/21 8887  ? cefTRIAXone (ROCEPHIN)  IV    ? metronidazole Stopped (05/22/21 0945)  ? ?PRN Meds:.acetaminophen **OR** acetaminophen, HYDROcodone-acetaminophen, ondansetron (ZOFRAN) IV ? ?CBC ?Recent Labs  ?Lab 05/21/21 ?1836 05/22/21 ?0215  ?WBC 12.9* 10.4  ?NEUTROABS  6.7 4.0  ?HGB 12.6* 11.3*  ?HCT 36.1* 31.5*  ?MCV 92.8 92.6  ?PLT PLATELET CLUMPS NOTED ON SMEAR, UNABLE TO ESTIMATE 29*  ? ?Basic Metabolic Panel ?Recent Labs  ?Lab 05/21/21 ?1836 05/21/21 ?1923 05/22/21 ?0215 05/22/21 ?1015  ?NA 135  --  135  --   ?K 3.8  --  4.1  --   ?CL 100  --  104  --   ?CO2 24  --  21*  --   ?GLUCOSE 119*  --  100*  --   ?BUN 48*  --  48*  --   ?CREATININE 4.14*  --  3.92*  --   ?CALCIUM 11.9*  --  11.5*  11.5* 11.2*  ?PHOS  --  5.3* 5.7*  --   ? ? ?Physical Exam   ?Blood pressure 124/72, pulse 89, temperature (!) 96.4 ?F (35.8 ?C), temperature source Axillary, resp. rate 16, height 5' 8"  (1.727 m), weight 77.1 kg, SpO2 98 %. ?GEN: NAD, conversant ?ENT: NCAT ?EYES: EOMI ?CV: Regular ?PULM: Faint bibasilar crackles, normal work of breathing ?ABD: Soft, nontender ?SKIN: No rashes or lesions ?EXT: No edema ? ?Assessment ?65M renal failure of unclear chronicity, moderate hypercalcemia, anemia/thrombocytopenia, and strong suspicion of myeloma. ? ?Renal failure, unclear chronicity ?CT Abd at admission w/o acute issues ?UA +Hb and protein ?Suspect related to myeloma ?Hypercalcemia, mild hyperphosphatemia, receiving hydration and calcitonin ?Anemia, thrombocytopenia; concern for myeloma, oncology following ?Hypoalbuminemia/PCM ? ?Plan ?I suspect renal involvement is connected to his monoclonal process, though this is yet to be proven ?F/u SPEP, SFLC, PTH, PTHrP ?I would not immediately pursue renal biopsy, would start with diagnostic tests arranged by oncology ?Agree with calcitonin, bisphosphonate ?Continue hydration ?No indication for dialysis ?Check UPC ?We will follow along closely ?Daily weights, Daily Renal Panel, Strict I/Os, Avoid nephrotoxins (NSAIDs, judicious IV Contrast)  ? ? ?Rexene Agent  ?05/22/2021, 11:27 AM ? ? ?  ?

## 2021-05-22 NOTE — Consult Note (Signed)
New Hematology/Oncology Consult   Requesting ZG:YFVCBSWH [place]        Reason for Consult: Hypercalcemia, renal failure, anemia/thrombocytopenia  HPI: Mr. Nelles reports a 3-week history of low back pain since falling in his kitchen at home.  He also reports heartburn, somnolence, intermittent night sweats, and malaise.  He reports he was evaluated by physicians in [facility] and no diagnosis was made. He presented to the emergency room yesterday.  Laboratories were remarkable for renal failure and hypercalcemia.  He reports he has been sleeping most of the day for several weeks.  He has fallen asleep at work.  He has urinary frequency. A CT of the abdomen and pelvis revealed equivocal stranding surrounding a diverticulum at the descending colon, questionable lucent lesion in the right posterior 11th rib.  A 6 mm lucency involve the anterior aspect of L3.       Past Medical History:  Diagnosis Date   Asthma   :  Past surgical history: Lymph node resection for "cat scratch fever "-remote past Inguinal hernia repairs as a child   Current Facility-Administered Medications:    0.9 %  sodium chloride infusion, , Intravenous, Continuous, Horris Latino, Adline Peals, MD, Last Rate: 100 mL/hr at 05/22/21 0903, New Bag at 05/22/21 0903   acetaminophen (TYLENOL) tablet 650 mg, 650 mg, Oral, Q6H PRN **OR** acetaminophen (TYLENOL) suppository 650 mg, 650 mg, Rectal, Q6H PRN, Doutova, Anastassia, MD   calcitonin (MIACALCIN) injection 308 Units, 4 Units/kg, Subcutaneous, BID, Doutova, Anastassia, MD, 308 Units at 05/22/21 0500   cefTRIAXone (ROCEPHIN) 2 g in sodium chloride 0.9 % 100 mL IVPB, 2 g, Intravenous, Q24H, Pham, Minh Q, RPH-CPP   famotidine (PEPCID) tablet 20 mg, 20 mg, Oral, Daily, Alma Friendly, MD, 20 mg at 05/22/21 6759   HYDROcodone-acetaminophen (NORCO/VICODIN) 5-325 MG per tablet 1-2 tablet, 1-2 tablet, Oral, Q4H PRN, Roel Cluck, Anastassia, MD, 2 tablet at 05/22/21  0100   metroNIDAZOLE (FLAGYL) IVPB 500 mg, 500 mg, Intravenous, Q12H, Doutova, Anastassia, MD, Stopped at 05/22/21 0945   nicotine (NICODERM CQ - dosed in mg/24 hours) patch 14 mg, 14 mg, Transdermal, Daily, Doutova, Anastassia, MD, 14 mg at 05/22/21 0839   ondansetron (ZOFRAN) injection 4 mg, 4 mg, Intravenous, Q6H PRN, Alma Friendly, MD, 4 mg at 05/22/21 0901:   calcitonin  4 Units/kg Subcutaneous BID   famotidine  20 mg Oral Daily   nicotine  14 mg Transdermal Daily  :  No Known Allergies:    SOCIAL HISTORY: He lives with his girlfriend and new to [place].  He works in an Actor.  He smokes cigarettes.  No alcohol use.    Review of Systems:  Positives include: Malaise, "heartburn", somnolence, low back pain, urinary frequency, night sweats  A complete ROS was otherwise negative.   Physical Exam:  Blood pressure 124/72, pulse 89, temperature (!) 96.4 F (35.8 C), temperature source Axillary, resp. rate 16, height 5' 8"  (1.727 m), weight 170 lb (77.1 kg), SpO2 98 %.  HEENT: No thrush or ulcers Lungs: Clear bilaterally Cardiac: Regular rate and rhythm Abdomen: No hepatosplenomegaly, nontender  Vascular: No leg edema Lymph nodes: No cervical, supraclavicular, axillary, or inguinal nodes Neurologic: Alert and oriented, the motor exam appears intact in the upper and lower extremities bilaterally Skin: No rash Musculoskeletal: Tender at the sacrum and iliac, no mass  LABS:   Recent Labs    05/21/21 1836 05/22/21 0215  WBC 12.9* 10.4  HGB 12.6* 11.3*  HCT 36.1* 31.5*  PLT PLATELET CLUMPS NOTED ON SMEAR, UNABLE TO ESTIMATE 29*   Blood smear: The platelets appear decreased in number, I saw no platelet clumps.  Few ovalocytes, teardrops, and burr cells.  The majority the white cells are mature neutrophils and lymphocytes.  No blasts or other young forms are seen.  Few plasmacytoid lymphocytes.  Recent Labs    05/21/21 1836 05/22/21 0215  NA  135 135  K 3.8 4.1  CL 100 104  CO2 24 21*  GLUCOSE 119* 100*  BUN 48* 48*  CREATININE 4.14* 3.92*  CALCIUM 11.9* 11.5*   11.5*      RADIOLOGY:  CT ABDOMEN PELVIS WO CONTRAST  Result Date: 05/21/2021 CLINICAL DATA:  Flank and abdominal pain. Renal failure. Patient reports low back pain with nausea, vomiting, in frequent urination since fall in the kitchen on February 9th. EXAM: CT ABDOMEN AND PELVIS WITHOUT CONTRAST TECHNIQUE: Multidetector CT imaging of the abdomen and pelvis was performed following the standard protocol without IV contrast. RADIATION DOSE REDUCTION: This exam was performed according to the departmental dose-optimization program which includes automated exposure control, adjustment of the mA and/or kV according to patient size and/or use of iterative reconstruction technique. COMPARISON:  None. FINDINGS: Lower chest: Clear lung bases without focal airspace disease or pleural effusion. The heart is normal in size. Hepatobiliary: Suggestion of mild hepatic steatosis. There is no focal liver abnormality on this unenhanced exam. Decompressed gallbladder without calcified gallstone. No biliary dilatation. Pancreas: No ductal dilatation or inflammation. Spleen: Upper normal in size spanning 11.7 x 6.7 x 12.4 cm (volume = 510 cm^3). No focal splenic abnormality or perisplenic fluid. Adrenals/Urinary Tract: Normal adrenal glands. No hydronephrosis or renal calculi. No perinephric edema. No evidence of focal renal lesion. Decompressed ureters. No ureteral stone. Unremarkable urinary bladder. Stomach/Bowel: Decompressed stomach. Occasional fluid-filled small bowel without obstruction, inflammation or abnormal distention. Normal appendix. Diverticulosis from the distal transverse colon distally. There is equivocal faint stranding about a diverticulum involving the descending colon, series 3, image 46. No other diverticulitis or acute colonic inflammation Vascular/Lymphatic: Moderate aortic  atherosclerosis, advanced for age. No portal venous or mesenteric gas. No bulky abdominopelvic adenopathy. Reproductive: Prostate is unremarkable. Other: No free air or free fluid. There is a small fat containing umbilical hernia. Musculoskeletal: Lumbar spine assessed on lumbar spine reformats, reported separately. Heterogeneous marrow of the bony pelvis. Questionable lucent lesion within the right posterior lower eleventh rib, series 3, images 21 and 22 no fracture. Included musculature is unremarkable. IMPRESSION: 1. No renal stones or obstructive uropathy. 2. Equivocal faint stranding about a diverticulum involving the descending colon, which may represent mild diverticulitis. Multiple additional colonic diverticula from the splenic flexure distally. 3. Suggestion of mild hepatic steatosis. 4. Small fat containing umbilical hernia. 5. There is a questionable lucent lesion within the right posterior eleventh rib. This is nonspecific. Consider elective bone scan for further assessment Aortic Atherosclerosis (ICD10-I70.0). Electronically Signed   By: [name] Rake M.D.   On: 05/21/2021 20:34   DG Chest 2 View  Result Date: 05/21/2021 CLINICAL DATA:  Chest pain, history of prior rib fracture, initial encounter EXAM: CHEST - 2 VIEW COMPARISON:  None. FINDINGS: Cardiac shadow is within normal limits. Aortic calcifications are noted. The lungs are well aerated without focal infiltrate, effusion or pneumothorax. No discrete rib fracture is noted. Mild inferior endplate deformity is noted in the lower thoracic spine but appears chronic in nature. Correlate to point tenderness. No other focal abnormality is noted. IMPRESSION: No discrete rib  fracture is noted.  No pneumothorax is seen. Inferior endplate deformity in the lower thoracic spine which appears chronic in nature. Correlate to point tenderness. No other focal abnormality is noted. Electronically Signed   By: Inez Catalina M.D.   On: 05/21/2021 19:27   CT  L-SPINE NO CHARGE  Result Date: 05/21/2021 CLINICAL DATA:  Low back pain. EXAM: CT LUMBAR SPINE WITHOUT CONTRAST TECHNIQUE: Multidetector CT imaging of the lumbar spine was performed without intravenous contrast administration. Multiplanar CT image reconstructions were also generated. RADIATION DOSE REDUCTION: This exam was performed according to the departmental dose-optimization program which includes automated exposure control, adjustment of the mA and/or kV according to patient size and/or use of iterative reconstruction technique. COMPARISON:  None. FINDINGS: Segmentation: 5 lumbar type vertebrae. Alignment: Normal. Vertebrae: No fracture. There is 6 mm lucency involving the anterior aspect of L3 superior endplate. Background bone marrow is heterogeneous. Paraspinal and other soft tissues: Assessed on concurrent abdominopelvic CT, reported separately. Disc levels: Minor disc bulge at L4-L5 and L5-S1. No spinal canal or neural foraminal narrowing. IMPRESSION: 1. No fracture or subluxation of the lumbar spine. 2. Heterogeneous bone marrow with a 6 mm lucency involving the anterior aspect of L3 superior endplate. Findings are nonspecific and may be related to osteopenia. MRI could be considered for further evaluation. 3. Minor disc bulge at L4-L5 and L5-S1. No spinal canal or neural foraminal narrowing. Electronically Signed   By: [name] Rake M.D.   On: 05/21/2021 20:39    Assessment and Plan:   Renal failure Hypercalcemia Probable lytic bone lesion Anemia Thrombocytopenia  Mr. Guyton presents with back pain, hypercalcemia, renal failure, and anemia/thrombocytopenia.  The clinical presentation is most consistent with a diagnosis of multiple myeloma, though the total serum protein level and urine protein on the dipstick are not significantly elevated.  The differential diagnosis includes another malignancy causing hypercalcemia.  I recommended proceeding with an expedited diagnostic evaluation to  include a bone marrow biopsy.  I will treat him empirically with Decadron today.  I recommend biphosphonate therapy for the hypercalcemia.  I discussed the differential diagnosis and evaluation plan with Mr. Whirley and his mother.  Recommendations: Diagnostic bone marrow biopsy Check myeloma panel to include a serum immunofixation and serum free light chains Nephrology consult for management of renal failure Biphosphonate therapy to treat the hypercalcemia Pulse dose of Decadron today Oncology will follow him in the hospital and outpatient follow-up will be scheduled at the Cancer center here or closer to home   Betsy Coder, MD 05/22/2021, 10:32 AM

## 2021-05-22 NOTE — Progress Notes (Signed)
New Admission Note: ? ?Arrival Method: By bed from ED around 2320 ?Mental Orientation: Alert and oriented ?Telemetry: Box 1, CCMD notified ?Assessment: Completed ?Skin: Completed, refer to flowsheets ?IV: L AC S.L. ?Pain: 6/10 lower back pain ?Tubes: None ?Safety Measures: Safety Fall Prevention Plan was given, discussed and signed. ?Admission: Completed ?[facility] Orientation: Patient has been orientated to the room, unit and the staff. ?Family: Mom at bedside ? ?Orders have been reviewed and implemented. Will continue to monitor the patient. Call light has been placed within reach and bed alarm has been activated.  ? ?Fara Olden, RN  ?Phone Number: [phone]   ?

## 2021-05-22 NOTE — Progress Notes (Signed)
Critical platelets of 29. RN messaged MD on call.  ? ?Jordan King [name]  ?

## 2021-05-22 NOTE — Consult Note (Signed)
Chief Complaint: Patient was seen in consultation today for thrombocytopenia, AKI  Referring Physician(s): Dr. Betsy Coder  Supervising Physician: Juliet Rude  Patient Status: [facility] - In-pt  History of Present Illness: Jordan King is a 51 y.o. male with no significant past medical history who presents with back pain, hypercalcemia, renal failure, and anemia/thrombocytopenia. He has been seen by Heme/Onc due to concern for multiple myeloma. A diagnostic bone marrow biopsy has been requested.   Patient assessed at bedside. He is aware of goals of bone marrow biopsy and is agreeable.   Past Medical History:  Diagnosis Date   Asthma     History reviewed. No pertinent surgical history.  Allergies: Patient has no known allergies.  Medications: Prior to Admission medications   Medication Sig Start Date End Date Taking? Authorizing Provider  acetaminophen (TYLENOL) 500 MG tablet Take 1,000 mg by mouth every 6 (six) hours as needed for moderate pain or headache.   Yes [provider]  albuterol (VENTOLIN HFA) 108 (90 Base) MCG/ACT inhaler Inhale 2 puffs into the lungs every 6 (six) hours as needed for wheezing or shortness of breath.   Yes [provider]  bismuth subsalicylate (PEPTO BISMOL) 262 MG/15ML suspension Take 30 mLs by mouth every 6 (six) hours as needed for diarrhea or loose stools or indigestion.   Yes [provider]  calcium carbonate (TUMS EX) 750 MG chewable tablet Chew 2 tablets by mouth every 4 (four) hours as needed for heartburn.   Yes [provider]  cyclobenzaprine (FLEXERIL) 10 MG tablet Take 10 mg by mouth 3 (three) times daily as needed for muscle spasms.   Yes [provider]  ibuprofen (ADVIL) 200 MG tablet Take 400 mg by mouth every 6 (six) hours as needed for moderate pain or headache.   Yes [provider]     History reviewed. No pertinent family history.  Social History    Socioeconomic History   Marital status: Single    Spouse name: Not on file   Number of children: Not on file   Years of education: Not on file   Highest education level: Not on file  Occupational History   Not on file  Tobacco Use   Smoking status: Every Day    Types: Cigarettes   Smokeless tobacco: Never  Substance and Sexual Activity   Alcohol use: Not Currently   Drug use: Not Currently   Sexual activity: Not on file  Other Topics Concern   Not on file  Social History Narrative   Not on file   Social Determinants of Health   Financial Resource Strain: Not on file  Food Insecurity: Not on file  Transportation Needs: Not on file  Physical Activity: Not on file  Stress: Not on file  Social Connections: Not on file     Review of Systems: A 12 point ROS discussed and pertinent positives are indicated in the HPI above.  All other systems are negative.  Review of Systems  Constitutional:  Positive for fatigue. Negative for fever.  Respiratory:  Negative for cough and shortness of breath.   Gastrointestinal:  Negative for abdominal pain.  Musculoskeletal:  Positive for back pain.  Psychiatric/Behavioral:  Negative for behavioral problems and confusion.    Vital Signs: BP 124/72    Pulse 89    Temp (!) 96.4 F (35.8 C) (Axillary)    Resp 16    Ht 5' 8"  (1.727 m)    Wt 170 lb (77.1  kg)    SpO2 98%    BMI 25.85 kg/m   Physical Exam Vitals and nursing note reviewed.  Constitutional:      General: He is not in acute distress.    Appearance: He is well-developed. He is not ill-appearing.  Cardiovascular:     Rate and Rhythm: Normal rate and regular rhythm.  Pulmonary:     Effort: Pulmonary effort is normal.  Skin:    General: Skin is warm and dry.  Neurological:     General: No focal deficit present.     Mental Status: He is alert and oriented to person, place, and time.  Psychiatric:        Mood and Affect: Mood normal.        Behavior: Behavior normal.      MD Evaluation Airway: WNL Heart: WNL Abdomen: WNL Chest/ Lungs: WNL ASA  Classification: 3 Mallampati/Airway Score: One   Imaging: CT ABDOMEN PELVIS WO CONTRAST  Result Date: 05/21/2021 CLINICAL DATA:  Flank and abdominal pain. Renal failure. Patient reports low back pain with nausea, vomiting, in frequent urination since fall in the kitchen on February 9th. EXAM: CT ABDOMEN AND PELVIS WITHOUT CONTRAST TECHNIQUE: Multidetector CT imaging of the abdomen and pelvis was performed following the standard protocol without IV contrast. RADIATION DOSE REDUCTION: This exam was performed according to the departmental dose-optimization program which includes automated exposure control, adjustment of the mA and/or kV according to patient size and/or use of iterative reconstruction technique. COMPARISON:  None. FINDINGS: Lower chest: Clear lung bases without focal airspace disease or pleural effusion. The heart is normal in size. Hepatobiliary: Suggestion of mild hepatic steatosis. There is no focal liver abnormality on this unenhanced exam. Decompressed gallbladder without calcified gallstone. No biliary dilatation. Pancreas: No ductal dilatation or inflammation. Spleen: Upper normal in size spanning 11.7 x 6.7 x 12.4 cm (volume = 510 cm^3). No focal splenic abnormality or perisplenic fluid. Adrenals/Urinary Tract: Normal adrenal glands. No hydronephrosis or renal calculi. No perinephric edema. No evidence of focal renal lesion. Decompressed ureters. No ureteral stone. Unremarkable urinary bladder. Stomach/Bowel: Decompressed stomach. Occasional fluid-filled small bowel without obstruction, inflammation or abnormal distention. Normal appendix. Diverticulosis from the distal transverse colon distally. There is equivocal faint stranding about a diverticulum involving the descending colon, series 3, image 46. No other diverticulitis or acute colonic inflammation Vascular/Lymphatic: Moderate aortic  atherosclerosis, advanced for age. No portal venous or mesenteric gas. No bulky abdominopelvic adenopathy. Reproductive: Prostate is unremarkable. Other: No free air or free fluid. There is a small fat containing umbilical hernia. Musculoskeletal: Lumbar spine assessed on lumbar spine reformats, reported separately. Heterogeneous marrow of the bony pelvis. Questionable lucent lesion within the right posterior lower eleventh rib, series 3, images 21 and 22 no fracture. Included musculature is unremarkable. IMPRESSION: 1. No renal stones or obstructive uropathy. 2. Equivocal faint stranding about a diverticulum involving the descending colon, which may represent mild diverticulitis. Multiple additional colonic diverticula from the splenic flexure distally. 3. Suggestion of mild hepatic steatosis. 4. Small fat containing umbilical hernia. 5. There is a questionable lucent lesion within the right posterior eleventh rib. This is nonspecific. Consider elective bone scan for further assessment Aortic Atherosclerosis (ICD10-I70.0). Electronically Signed   By: [name] Rake M.D.   On: 05/21/2021 20:34   DG Chest 2 View  Result Date: 05/21/2021 CLINICAL DATA:  Chest pain, history of prior rib fracture, initial encounter EXAM: CHEST - 2 VIEW COMPARISON:  None. FINDINGS: Cardiac shadow is within normal limits. Aortic  calcifications are noted. The lungs are well aerated without focal infiltrate, effusion or pneumothorax. No discrete rib fracture is noted. Mild inferior endplate deformity is noted in the lower thoracic spine but appears chronic in nature. Correlate to point tenderness. No other focal abnormality is noted. IMPRESSION: No discrete rib fracture is noted.  No pneumothorax is seen. Inferior endplate deformity in the lower thoracic spine which appears chronic in nature. Correlate to point tenderness. No other focal abnormality is noted. Electronically Signed   By: Inez Catalina M.D.   On: 05/21/2021 19:27   CT  L-SPINE NO CHARGE  Addendum Date: 05/22/2021   ADDENDUM REPORT: 05/22/2021 14:45 ADDENDUM: Upon further review, in addition to the L3 lucent lesion mentioned in the initial exam, bone marrow of the lumbar spine appears heterogeneous with suggestion of multiple small lucent lesions. Consulting provider has suspicion for multiple myeloma, and this may represent diffuse myelomatous involvement. Electronically Signed   By: [name] Rake M.D.   On: 05/22/2021 14:45   Result Date: 05/22/2021 CLINICAL DATA:  Low back pain. EXAM: CT LUMBAR SPINE WITHOUT CONTRAST TECHNIQUE: Multidetector CT imaging of the lumbar spine was performed without intravenous contrast administration. Multiplanar CT image reconstructions were also generated. RADIATION DOSE REDUCTION: This exam was performed according to the departmental dose-optimization program which includes automated exposure control, adjustment of the mA and/or kV according to patient size and/or use of iterative reconstruction technique. COMPARISON:  None. FINDINGS: Segmentation: 5 lumbar type vertebrae. Alignment: Normal. Vertebrae: No fracture. There is 6 mm lucency involving the anterior aspect of L3 superior endplate. Background bone marrow is heterogeneous. Paraspinal and other soft tissues: Assessed on concurrent abdominopelvic CT, reported separately. Disc levels: Minor disc bulge at L4-L5 and L5-S1. No spinal canal or neural foraminal narrowing. IMPRESSION: 1. No fracture or subluxation of the lumbar spine. 2. Heterogeneous bone marrow with a 6 mm lucency involving the anterior aspect of L3 superior endplate. Findings are nonspecific and may be related to osteopenia. MRI could be considered for further evaluation. 3. Minor disc bulge at L4-L5 and L5-S1. No spinal canal or neural foraminal narrowing. Electronically Signed: By: [name] Rake M.D. On: 05/21/2021 20:39    Labs:  CBC: Recent Labs    05/21/21 1836 05/22/21 0215  WBC 12.9* 10.4  HGB 12.6*  11.3*  HCT 36.1* 31.5*  PLT PLATELET CLUMPS NOTED ON SMEAR, UNABLE TO ESTIMATE 29*    COAGS: Recent Labs    05/22/21 0215  INR 1.1    BMP: Recent Labs    05/21/21 1836 05/22/21 0215 05/22/21 1015  NA 135 135  --   K 3.8 4.1  --   CL 100 104  --   CO2 24 21*  --   GLUCOSE 119* 100*  --   BUN 48* 48*  --   CALCIUM 11.9* 11.5*   11.5* 11.2*  CREATININE 4.14* 3.92*  --   GFRNONAA 17* 18*  --     LIVER FUNCTION TESTS: Recent Labs    05/21/21 1920 05/22/21 0215  BILITOT 0.4 0.4  AST 16 14*  ALT 20 18  ALKPHOS 68 59  PROT 5.8* 5.3*  ALBUMIN 2.9* 2.6*    TUMOR MARKERS: No results for input(s): AFPTM, CEA, CA199, CHROMGRNA in the last 8760 hours.  Assessment and Plan: Thrombocytopenia, renal failure Patient with new onset back pain, acute kidney injury. Work-up concerning for possible multiple myeloma.  Seen by Heme/Onc who recommends bone marrow biopsy.  Patient to be NPO p MN for possible  bone marrow biopsy tomorrow pending cytology and CT availability.  Patient aware that procedure is subject to scheduling limitations.   Risks and benefits of biopsy was discussed with the patient and/or patient's family including, but not limited to bleeding, infection, damage to adjacent structures or low yield requiring additional tests.  All of the questions were answered and there is agreement to proceed.  Consent signed and in chart.  Thank you for this interesting consult.  I greatly enjoyed meeting Damone L Davisjr and look forward to participating in their care.  A copy of this report was sent to the requesting provider on this date.  Electronically Signed: Docia Barrier, PA 05/22/2021, 2:50 PM   I spent a total of 40 Minutes    in face to face in clinical consultation, greater than 50% of which was counseling/coordinating care for thrombocytopenia.

## 2021-05-22 NOTE — Progress Notes (Signed)
PHARMACY NOTE:  ANTIMICROBIAL RENAL DOSAGE ADJUSTMENT ? ?Current antimicrobial regimen includes a mismatch between antimicrobial dosage and estimated renal function.  As per policy approved by the Pharmacy & Therapeutics and Medical Executive Committees, the antimicrobial dosage will be adjusted accordingly. ? ?Current antimicrobial dosage:  Ceftriaxone 1g IV q12 ? ?Indication: Diverticulitis ? ?Renal Function: ? ?   ?Antimicrobial dosage has been changed to:  Ceftriaxone 2g IV q24 ? ?Additional comments: ? ?Onnie Boer, PharmD, BCIDP, AAHIVP, CPP ?Infectious Disease Pharmacist ?05/22/2021 10:32 AM ? ? ? ? ?  ?

## 2021-05-22 NOTE — Progress Notes (Addendum)
PROGRESS NOTE  Jordan King EVO:[id number] DOB: May 12, 1970 DOA: 05/21/2021 PCP: Pcp, No  HPI/Recap of past 6 hours: 51 year old male with a past medical history of asthma, tobacco abuse, presents to the ED with multiple complaints including 3-week history of pain around right sided upper chest, back, since falling in his kitchen at home.  Patient also reports recently for the past few days, persistent nausea and vomiting unable to keep any food down, epigastric tenderness/heartburn of which she has been consuming a lot of Tums, intermittent night sweats, malaise, generalized weakness.  Patient reports taking some ibuprofen due to his pain.  In the ED, [facility], labs showed creatinine of 4.1, BUN 48, WBC 12.9, hemoglobin 12.6, thrombocytopenia, calcium 11.9.  CT spine showed no fracture, but 6 mm lucency involving the anterior aspect of the L3, may be related to osteopenia, CT abdomen/pelvis showed faint stranding about a diverticulum involving the descending colon which may represent mild diverticulitis with questionable lucent lesion within the right posterior 11th rib.  Due to the above finding, urology and oncology were both consulted. Patient admitted for further management.     Today, patient continues to complain of nausea and vomiting, with some epigastric tenderness, denies any chest pain, shortness of breath, abdominal pain, fever/chills.    Assessment/Plan: Principal Problem:   AKI (acute kidney injury) ([place]) Active Problems:   Hypercalcemia   Lytic bone lesions on xray   Diverticulitis   Dehydration   Tobacco abuse    Likely acute renal failure, but unclear chronicity Possibly related to ??myeloma given other findings Cr of 4.1, BUN 48 on admission UA with trace proteinuria, moderate hemoglobinuria Nephrology consulted, agree with possible myeloma, awaiting SPEP, serum free light chains, PTH which are pending, no further work-up for now from a renal standpoint Daily  BMP  Hypercalcemia Mild hyperphosphatemia 11.9 on admission Continue IV fluids, calcitonin and bisphosphonate Frequent calcium levels  Anemia, thrombocytopenia Concern for myeloma Oncology on board, strong suspicion of MM, but will work-up further, recommend bone marrow biopsy Myeloma panel pending Received pulse dose of Decadron once on 05/22/2021  Lytic bone lesions on x-ray Concern for myeloma Pain management  ??Possible diverticulitis Noted on CT abdomen/pelvis Continue with IV antibiotics, Flagyl, ceftriaxone for now pending further evaluation  Possible GERD Start famotidine  Tobacco abuse Advised to quit  History of asthma Stable Inhalers as needed     Estimated body mass index is 25.85 kg/m as calculated from the following:   Height as of this encounter: 5' 8"  (1.727 m).   Weight as of this encounter: 77.1 kg.     Code Status: Full  Family Communication: Discussed with mother at bedside  Disposition Plan: Status is: Inpatient Remains inpatient appropriate because: Level of care     Consultants: Oncology Nephrology  Procedures: None  Antimicrobials: Ceftriaxone  DVT prophylaxis: SCDs given significant thrombocytopenia   Objective: Vitals:   05/21/21 2230 05/21/21 2320 05/22/21 0524 05/22/21 0903  BP: 103/62 106/68 105/66 124/72  Pulse: 79 82 60 89  Resp: 19 18 18 16   Temp:  97.9 F (36.6 C) 97.7 F (36.5 C) (!) 96.4 F (35.8 C)  TempSrc:  Oral Oral Axillary  SpO2: 96% 98% 97% 98%  Weight:      Height:        Intake/Output Summary (Last 24 hours) at 05/22/2021 1329 Last data filed at 05/22/2021 1215 Gross per 24 hour  Intake 737.41 ml  Output 521 ml  Net 216.41 ml   [employer]  05/21/21 1855  Weight: 77.1 kg    Exam: General: NAD  Cardiovascular: S1, S2 present Respiratory: CTAB Abdomen: Soft, nontender, nondistended, bowel sounds present Musculoskeletal: No bilateral pedal edema noted Skin: Normal Psychiatry:  Normal mood     Data Reviewed: CBC: Recent Labs  Lab 05/21/21 1836 05/22/21 0215  WBC 12.9* 10.4  NEUTROABS 6.7 4.0  HGB 12.6* 11.3*  HCT 36.1* 31.5*  MCV 92.8 92.6  PLT PLATELET CLUMPS NOTED ON SMEAR, UNABLE TO ESTIMATE 29*   Basic Metabolic Panel: Recent Labs  Lab 05/21/21 1836 05/21/21 1923 05/22/21 0215 05/22/21 1015  NA 135  --  135  --   K 3.8  --  4.1  --   CL 100  --  104  --   CO2 24  --  21*  --   GLUCOSE 119*  --  100*  --   BUN 48*  --  48*  --   CREATININE 4.14*  --  3.92*  --   CALCIUM 11.9*  --  11.5*   11.5* 11.2*  MG  --  2.9* 2.9*  --   PHOS  --  5.3* 5.7*  --    GFR: Estimated Creatinine Clearance: 22.1 mL/min (A) (by C-G formula based on SCr of 3.92 mg/dL (H)). Liver Function Tests: Recent Labs  Lab 05/21/21 1920 05/22/21 0215  AST 16 14*  ALT 20 18  ALKPHOS 68 59  BILITOT 0.4 0.4  PROT 5.8* 5.3*  ALBUMIN 2.9* 2.6*   Recent Labs  Lab 05/21/21 1920  LIPASE 24   No results for input(s): AMMONIA in the last 168 hours. Coagulation Profile: Recent Labs  Lab 05/22/21 0215  INR 1.1   Cardiac Enzymes: Recent Labs  Lab 05/21/21 1920  CKTOTAL 118   BNP (last 3 results) No results for input(s): PROBNP in the last 8760 hours. HbA1C: No results for input(s): HGBA1C in the last 72 hours. CBG: No results for input(s): GLUCAP in the last 168 hours. Lipid Profile: No results for input(s): CHOL, HDL, LDLCALC, TRIG, CHOLHDL, LDLDIRECT in the last 72 hours. Thyroid Function Tests: Recent Labs    05/21/21 1923  TSH 1.642   Anemia Panel: No results for input(s): VITAMINB12, FOLATE, FERRITIN, TIBC, IRON, RETICCTPCT in the last 72 hours. Urine analysis:    Component Value Date/Time   COLORURINE YELLOW 05/21/2021 1850   APPEARANCEUR CLEAR 05/21/2021 1850   LABSPEC 1.017 05/21/2021 1850   PHURINE 6.0 05/21/2021 1850   GLUCOSEU NEGATIVE 05/21/2021 1850   HGBUR MODERATE (A) 05/21/2021 1850   BILIRUBINUR NEGATIVE 05/21/2021 1850    KETONESUR NEGATIVE 05/21/2021 1850   PROTEINUR 30 (A) 05/21/2021 1850   NITRITE NEGATIVE 05/21/2021 1850   LEUKOCYTESUR NEGATIVE 05/21/2021 1850   Sepsis Labs: @LABRCNTIP (procalcitonin:4,lacticidven:4)  ) Recent Results (from the past 240 hour(s))  Resp Panel by RT-PCR (Flu A&B, Covid) Nasopharyngeal Swab     Status: None   Collection Time: 05/21/21  9:23 PM   Specimen: Nasopharyngeal Swab; Nasopharyngeal(NP) swabs in vial transport medium  Result Value Ref Range Status   SARS Coronavirus 2 by RT PCR NEGATIVE NEGATIVE Final    Comment: (NOTE) SARS-CoV-2 target nucleic acids are NOT DETECTED.  The SARS-CoV-2 RNA is generally detectable in upper respiratory specimens during the acute phase of infection. The lowest concentration of SARS-CoV-2 viral copies this assay can detect is 138 copies/mL. A negative result does not preclude SARS-Cov-2 infection and should not be used as the sole basis for treatment or other patient management decisions.  A negative result may occur with  improper specimen collection/handling, submission of specimen other than nasopharyngeal swab, presence of viral mutation(s) within the areas targeted by this assay, and inadequate number of viral copies(<138 copies/mL). A negative result must be combined with clinical observations, patient history, and epidemiological information. The expected result is Negative.  Fact Sheet for Patients:  EntrepreneurPulse.com.au  Fact Sheet for Healthcare Providers:  IncredibleEmployment.be  This test is no t yet approved or cleared by the [place] FDA and  has been authorized for detection and/or diagnosis of SARS-CoV-2 by FDA under an Emergency Use Authorization (EUA). This EUA will remain  in effect (meaning this test can be used) for the duration of the COVID-19 declaration under Section 564(b)(1) of the Act, 21 U.S.C.section 360bbb-3(b)(1), unless the authorization is  terminated  or revoked sooner.       Influenza A by PCR NEGATIVE NEGATIVE Final   Influenza B by PCR NEGATIVE NEGATIVE Final    Comment: (NOTE) The Xpert Xpress SARS-CoV-2/FLU/RSV plus assay is intended as an aid in the diagnosis of influenza from Nasopharyngeal swab specimens and should not be used as a sole basis for treatment. Nasal washings and aspirates are unacceptable for Xpert Xpress SARS-CoV-2/FLU/RSV testing.  Fact Sheet for Patients: EntrepreneurPulse.com.au  Fact Sheet for Healthcare Providers: IncredibleEmployment.be  This test is not yet approved or cleared by the [place] FDA and has been authorized for detection and/or diagnosis of SARS-CoV-2 by FDA under an Emergency Use Authorization (EUA). This EUA will remain in effect (meaning this test can be used) for the duration of the COVID-19 declaration under Section 564(b)(1) of the Act, 21 U.S.C. section 360bbb-3(b)(1), unless the authorization is terminated or revoked.  Performed at [facility], [place] [address], [place], [place] [address]       Studies: CT ABDOMEN PELVIS WO CONTRAST  Result Date: 05/21/2021 CLINICAL DATA:  Flank and abdominal pain. Renal failure. Patient reports low back pain with nausea, vomiting, in frequent urination since fall in the kitchen on February 9th. EXAM: CT ABDOMEN AND PELVIS WITHOUT CONTRAST TECHNIQUE: Multidetector CT imaging of the abdomen and pelvis was performed following the standard protocol without IV contrast. RADIATION DOSE REDUCTION: This exam was performed according to the departmental dose-optimization program which includes automated exposure control, adjustment of the mA and/or kV according to patient size and/or use of iterative reconstruction technique. COMPARISON:  None. FINDINGS: Lower chest: Clear lung bases without focal airspace disease or pleural effusion. The heart is normal in size. Hepatobiliary: Suggestion of mild  hepatic steatosis. There is no focal liver abnormality on this unenhanced exam. Decompressed gallbladder without calcified gallstone. No biliary dilatation. Pancreas: No ductal dilatation or inflammation. Spleen: Upper normal in size spanning 11.7 x 6.7 x 12.4 cm (volume = 510 cm^3). No focal splenic abnormality or perisplenic fluid. Adrenals/Urinary Tract: Normal adrenal glands. No hydronephrosis or renal calculi. No perinephric edema. No evidence of focal renal lesion. Decompressed ureters. No ureteral stone. Unremarkable urinary bladder. Stomach/Bowel: Decompressed stomach. Occasional fluid-filled small bowel without obstruction, inflammation or abnormal distention. Normal appendix. Diverticulosis from the distal transverse colon distally. There is equivocal faint stranding about a diverticulum involving the descending colon, series 3, image 46. No other diverticulitis or acute colonic inflammation Vascular/Lymphatic: Moderate aortic atherosclerosis, advanced for age. No portal venous or mesenteric gas. No bulky abdominopelvic adenopathy. Reproductive: Prostate is unremarkable. Other: No free air or free fluid. There is a small fat containing umbilical hernia. Musculoskeletal: Lumbar spine assessed on lumbar spine  reformats, reported separately. Heterogeneous marrow of the bony pelvis. Questionable lucent lesion within the right posterior lower eleventh rib, series 3, images 21 and 22 no fracture. Included musculature is unremarkable. IMPRESSION: 1. No renal stones or obstructive uropathy. 2. Equivocal faint stranding about a diverticulum involving the descending colon, which may represent mild diverticulitis. Multiple additional colonic diverticula from the splenic flexure distally. 3. Suggestion of mild hepatic steatosis. 4. Small fat containing umbilical hernia. 5. There is a questionable lucent lesion within the right posterior eleventh rib. This is nonspecific. Consider elective bone scan for further  assessment Aortic Atherosclerosis (ICD10-I70.0). Electronically Signed   By: [name] Rake M.D.   On: 05/21/2021 20:34   DG Chest 2 View  Result Date: 05/21/2021 CLINICAL DATA:  Chest pain, history of prior rib fracture, initial encounter EXAM: CHEST - 2 VIEW COMPARISON:  None. FINDINGS: Cardiac shadow is within normal limits. Aortic calcifications are noted. The lungs are well aerated without focal infiltrate, effusion or pneumothorax. No discrete rib fracture is noted. Mild inferior endplate deformity is noted in the lower thoracic spine but appears chronic in nature. Correlate to point tenderness. No other focal abnormality is noted. IMPRESSION: No discrete rib fracture is noted.  No pneumothorax is seen. Inferior endplate deformity in the lower thoracic spine which appears chronic in nature. Correlate to point tenderness. No other focal abnormality is noted. Electronically Signed   By: Inez Catalina M.D.   On: 05/21/2021 19:27   CT L-SPINE NO CHARGE  Result Date: 05/21/2021 CLINICAL DATA:  Low back pain. EXAM: CT LUMBAR SPINE WITHOUT CONTRAST TECHNIQUE: Multidetector CT imaging of the lumbar spine was performed without intravenous contrast administration. Multiplanar CT image reconstructions were also generated. RADIATION DOSE REDUCTION: This exam was performed according to the departmental dose-optimization program which includes automated exposure control, adjustment of the mA and/or kV according to patient size and/or use of iterative reconstruction technique. COMPARISON:  None. FINDINGS: Segmentation: 5 lumbar type vertebrae. Alignment: Normal. Vertebrae: No fracture. There is 6 mm lucency involving the anterior aspect of L3 superior endplate. Background bone marrow is heterogeneous. Paraspinal and other soft tissues: Assessed on concurrent abdominopelvic CT, reported separately. Disc levels: Minor disc bulge at L4-L5 and L5-S1. No spinal canal or neural foraminal narrowing. IMPRESSION: 1. No  fracture or subluxation of the lumbar spine. 2. Heterogeneous bone marrow with a 6 mm lucency involving the anterior aspect of L3 superior endplate. Findings are nonspecific and may be related to osteopenia. MRI could be considered for further evaluation. 3. Minor disc bulge at L4-L5 and L5-S1. No spinal canal or neural foraminal narrowing. Electronically Signed   By: [name] Rake M.D.   On: 05/21/2021 20:39    Scheduled Meds:  calcitonin  4 Units/kg Subcutaneous BID   famotidine  20 mg Oral Daily   nicotine  14 mg Transdermal Daily    Continuous Infusions:  sodium chloride 100 mL/hr at 05/22/21 1146   cefTRIAXone (ROCEPHIN)  IV     metronidazole Stopped (05/22/21 0945)   pamidronate       LOS: 1 day     Jordan Friendly, MD [facility]  If 7PM-7AM, please contact night-coverage [email or web address] 05/22/2021, 1:29 PM

## 2021-05-22 NOTE — Progress Notes (Signed)
Pt states that he does not want to be a DNR and has never even considered it. Pt stated "I am only 51 years old, why would I say that I want to be a DNR?" RN messaged MD on call to have code status changed back to a full code per patient's request.  ? ?Jordan King ? ?

## 2021-05-22 NOTE — Progress Notes (Signed)
Lab found a 24 hour urine container.  ? ?Foster Simpson Khalis Hittle ? ?

## 2021-05-23 ENCOUNTER — Inpatient Hospital Stay (HOSPITAL_COMMUNITY): Payer: BLUE CROSS/BLUE SHIELD

## 2021-05-23 DIAGNOSIS — M8950 Osteolysis, unspecified site: Secondary | ICD-10-CM

## 2021-05-23 DIAGNOSIS — Z7189 Other specified counseling: Secondary | ICD-10-CM | POA: Insufficient documentation

## 2021-05-23 DIAGNOSIS — K5792 Diverticulitis of intestine, part unspecified, without perforation or abscess without bleeding: Secondary | ICD-10-CM | POA: Diagnosis not present

## 2021-05-23 DIAGNOSIS — N179 Acute kidney failure, unspecified: Secondary | ICD-10-CM | POA: Diagnosis not present

## 2021-05-23 DIAGNOSIS — M899 Disorder of bone, unspecified: Secondary | ICD-10-CM | POA: Diagnosis not present

## 2021-05-23 DIAGNOSIS — C9000 Multiple myeloma not having achieved remission: Secondary | ICD-10-CM | POA: Insufficient documentation

## 2021-05-23 LAB — CBC WITH DIFFERENTIAL/PLATELET
Abs Immature Granulocytes: 0.28 10*3/uL — ABNORMAL HIGH (ref 0.00–0.07)
Basophils Absolute: 0.1 10*3/uL (ref 0.0–0.1)
Basophils Relative: 1 %
Eosinophils Absolute: 0.1 10*3/uL (ref 0.0–0.5)
Eosinophils Relative: 1 %
HCT: 35.3 % — ABNORMAL LOW (ref 39.0–52.0)
Hemoglobin: 12.2 g/dL — ABNORMAL LOW (ref 13.0–17.0)
Immature Granulocytes: 3 %
Lymphocytes Relative: 22 %
Lymphs Abs: 2.3 10*3/uL (ref 0.7–4.0)
MCH: 32.3 pg (ref 26.0–34.0)
MCHC: 34.6 g/dL (ref 30.0–36.0)
MCV: 93.4 fL (ref 80.0–100.0)
Monocytes Absolute: 0.8 10*3/uL (ref 0.1–1.0)
Monocytes Relative: 7 %
Neutro Abs: 7.1 10*3/uL (ref 1.7–7.7)
Neutrophils Relative %: 66 %
Platelets: 31 10*3/uL — ABNORMAL LOW (ref 150–400)
RBC: 3.78 MIL/uL — ABNORMAL LOW (ref 4.22–5.81)
RDW: 15.1 % (ref 11.5–15.5)
Smear Review: NORMAL
WBC: 10.6 10*3/uL — ABNORMAL HIGH (ref 4.0–10.5)
nRBC: 0 % (ref 0.0–0.2)

## 2021-05-23 LAB — COMPREHENSIVE METABOLIC PANEL
ALT: 20 U/L (ref 0–44)
AST: 15 U/L (ref 15–41)
Albumin: 2.8 g/dL — ABNORMAL LOW (ref 3.5–5.0)
Alkaline Phosphatase: 60 U/L (ref 38–126)
Anion gap: 13 (ref 5–15)
BUN: 52 mg/dL — ABNORMAL HIGH (ref 6–20)
CO2: 17 mmol/L — ABNORMAL LOW (ref 22–32)
Calcium: 9.3 mg/dL (ref 8.9–10.3)
Chloride: 107 mmol/L (ref 98–111)
Creatinine, Ser: 3.94 mg/dL — ABNORMAL HIGH (ref 0.61–1.24)
GFR, Estimated: 18 mL/min — ABNORMAL LOW (ref >60)
Glucose, Bld: 155 mg/dL — ABNORMAL HIGH (ref 70–99)
Potassium: 4.1 mmol/L (ref 3.5–5.1)
Sodium: 137 mmol/L (ref 135–145)
Total Bilirubin: 0.5 mg/dL (ref 0.3–1.2)
Total Protein: 5.5 g/dL — ABNORMAL LOW (ref 6.5–8.1)

## 2021-05-23 LAB — PROTEIN / CREATININE RATIO, URINE
Creatinine, Urine: 63.24 mg/dL
Protein Creatinine Ratio: 9.39 mg/mg{Cre} — ABNORMAL HIGH (ref 0.00–0.15)
Total Protein, Urine: 594 mg/dL

## 2021-05-23 LAB — CALCIUM: Calcium: 8.8 mg/dL — ABNORMAL LOW (ref 8.9–10.3)

## 2021-05-23 LAB — KAPPA/LAMBDA LIGHT CHAINS
Kappa free light chain: 11.3 mg/L (ref 3.3–19.4)
Kappa, lambda light chain ratio: 0 — ABNORMAL LOW (ref 0.26–1.65)

## 2021-05-23 LAB — PARATHYROID HORMONE, INTACT (NO CA): PTH: 8 pg/mL — ABNORMAL LOW (ref 15–65)

## 2021-05-23 MED ORDER — MIDAZOLAM HCL 2 MG/2ML IJ SOLN
INTRAMUSCULAR | Status: DC | PRN
  Administered 2021-05-23: 1 mg via INTRAVENOUS
  Administered 2021-05-23: .5 mg via INTRAVENOUS

## 2021-05-23 MED ORDER — MIDAZOLAM HCL 2 MG/2ML IJ SOLN
INTRAMUSCULAR | Status: AC
  Filled 2021-05-23: qty 4

## 2021-05-23 MED ORDER — DEXAMETHASONE 6 MG PO TABS: 40.0000 mg | ORAL_TABLET | Freq: Once | ORAL | Status: DC

## 2021-05-23 MED ORDER — BORTEZOMIB CHEMO SQ INJECTION 3.5 MG (2.5MG/ML)
1.3000 mg/m2 | Freq: Once | INTRAMUSCULAR | Status: AC
  Administered 2021-05-23: 2.5 mg via SUBCUTANEOUS
  Filled 2021-05-23: qty 1

## 2021-05-23 MED ORDER — SODIUM CHLORIDE 0.9 % IV SOLN
12.5000 mg | Freq: Three times a day (TID) | INTRAVENOUS | Status: DC | PRN
  Filled 2021-05-23: qty 0.5

## 2021-05-23 MED ORDER — FENTANYL CITRATE (PF) 100 MCG/2ML IJ SOLN
INTRAMUSCULAR | Status: AC
  Filled 2021-05-23: qty 4

## 2021-05-23 MED ORDER — FENTANYL CITRATE (PF) 100 MCG/2ML IJ SOLN
INTRAMUSCULAR | Status: DC | PRN
  Administered 2021-05-23: 25 ug via INTRAVENOUS
  Administered 2021-05-23: 50 ug via INTRAVENOUS

## 2021-05-23 MED ORDER — LIDOCAINE HCL 1 % IJ SOLN
INTRAMUSCULAR | Status: AC
  Filled 2021-05-23: qty 10

## 2021-05-23 MED ORDER — PROCHLORPERAZINE MALEATE 10 MG PO TABS
10.0000 mg | ORAL_TABLET | Freq: Once | ORAL | Status: AC
  Administered 2021-05-23: 10 mg via ORAL
  Filled 2021-05-23: qty 1

## 2021-05-23 NOTE — Progress Notes (Signed)
Ok per MD to treat with velcade today although incr Scr out of parameters and low PLTC.  MD will start acyclovir later this week.   ?

## 2021-05-23 NOTE — Progress Notes (Signed)
?   05/23/21 0423  ?Provider Notification  ?Provider Name/Title Dr. Myna Hidalgo  ?Date Provider Notified 05/23/21  ?Time Provider Notified 0422  ?Notification Type Page  ?Notification Reason Change in status  ?Provider response No new orders ?(Continue to monitor patient)  ?Date of Provider Response 05/23/21  ?Time of Provider Response 0425  ? ?After administrating [place] Calcitonin, it was noted that patient's palms and fingertips of both hands were deep red and warm to touch.  Also, noted that patient's feet were red on the bottom of both feet and warm to the touch.  Per patient, this was new.  Patient stated that he had no pain or numbness in his extremities.  I consulted with pharmacist for possible reactions to medications.  Per pharmacist, calcitonin can cause flushing of the hands and feet.  VS stable and patient is in no distress.  Dr. Myna Hidalgo made aware and no new orders at this time.  Will continue to monitor patient.  Earleen Reaper RN ?

## 2021-05-23 NOTE — Procedures (Signed)
?  Procedure: CT bone marrow biopsy L iliac ?EBL:   minimal ?Complications:  none immediate ? ?See full dictation in [facility]. ? ?D. Arne Cleveland MD ?Main # [phone] ?Pager  [phone] ?Mobile [phone] ?  ? ?

## 2021-05-23 NOTE — Progress Notes (Signed)
PROGRESS NOTE  Jordan King WYO:[id number] DOB: 1970/10/08 DOA: 05/21/2021 PCP: Ayesha Mohair, MD  HPI/Recap of past 33 hours: 51 year old male with a past medical history of asthma, tobacco abuse, presents to the ED with multiple complaints including 3-week history of pain around right sided upper chest, back, since falling in his kitchen at home.  Patient also reports recently for the past few days, persistent nausea and vomiting unable to keep any food down, epigastric tenderness/heartburn of which she has been consuming a lot of Tums, intermittent night sweats, malaise, generalized weakness.  Patient reports taking some ibuprofen due to his pain.  In the ED, [facility], labs showed creatinine of 4.1, BUN 48, WBC 12.9, hemoglobin 12.6, thrombocytopenia, calcium 11.9.  CT spine showed no fracture, but 6 mm lucency involving the anterior aspect of the L3, may be related to osteopenia, CT abdomen/pelvis showed faint stranding about a diverticulum involving the descending colon which may represent mild diverticulitis with questionable lucent lesion within the right posterior 11th rib.  Due to the above finding, urology and oncology were both consulted. Patient admitted for further management.     Patient continues to complain of nausea, and abdominal pain, noted erythema on palms likely 2/2 calcitonin as per oncology    Assessment/Plan: Principal Problem:   AKI (acute kidney injury) ([facility]) Active Problems:   Hypercalcemia   Lytic bone lesions on xray   Diverticulitis   Dehydration   Tobacco abuse   Multiple myeloma (HCC)   Goals of care, counseling/discussion    Likely acute renal failure, but unclear chronicity Related to Multiple Myeloma Cr of 4.1, BUN 48 on admission UA with trace proteinuria, moderate hemoglobinuria Nephrology consulted, awaiting SPEP, serum free light chains, PTH which are pending, no further work-up for now from a renal standpoint Daily  CMP  Hypercalcemia Mild hyperphosphatemia Improving 11.9 on admission Continue IV fluids, s/p 1 dose of bisphosphonate and calcitonin on 05/22/20, currently stopped as Ca has improved Daily CMP  Anemia, thrombocytopenia Bone biopsy noted for Multiple myeloma Oncology on board, plan to start Velcade/decadron for treatment of MM Myeloma panel pending Daily CBC  Lytic bone lesions on x-ray Concern for myeloma Pain management  Leukocytosis Likely reactive from steroids  ??Possible diverticulitis Noted on CT abdomen/pelvis Continue with IV antibiotics, Flagyl, ceftriaxone for now  Possible GERD Continue famotidine  Tobacco abuse Advised to quit  History of asthma Stable Inhalers as needed     Estimated body mass index is 25.85 kg/m as calculated from the following:   Height as of this encounter: _0  (1.727 m).   Weight as of this encounter: 77.1 kg.     Code Status: Full  Family Communication: Discussed with mother at bedside  Disposition Plan: Status is: Inpatient Remains inpatient appropriate because: Level of care     Consultants: Oncology Nephrology  Procedures: Bone marrow biopsy on 05/23/2021  Antimicrobials: Ceftriaxone Flagyl  DVT prophylaxis: SCDs given significant thrombocytopenia   Objective: Vitals:   05/23/21 0945 05/23/21 0951 05/23/21 1108 05/23/21 1218  BP: 124/80 99/64 132/63 120/64  Pulse: 66 65 (!) 57 61  Resp: _1 Temp:   97.7 F (36.5 C) 97.7 F (36.5 C)  TempSrc:   Oral Oral  SpO2: 96% 95% 94% 93%  Weight:      Height:        Intake/Output Summary (Last 24 hours) at 05/23/2021 1524 Last data filed at 05/23/2021 1330 Gross per 24 hour  Intake 2885.96 ml  Output 2550 ml  Net 335.96 ml   Filed Weights   05/21/21 1855  Weight: 77.1 kg    Exam: General: NAD, noted erythematous palms Cardiovascular: S1, S2 present Respiratory: CTAB Abdomen: Soft, minimally tender, nondistended, bowel sounds  present Musculoskeletal: No bilateral pedal edema noted Skin: As noted above Psychiatry: Normal mood     Data Reviewed: CBC: Recent Labs  Lab 05/21/21 1836 05/22/21 0215 05/23/21 0206  WBC 12.9* 10.4 10.6*  NEUTROABS 6.7 4.0 7.1  HGB 12.6* 11.3* 12.2*  HCT 36.1* 31.5* 35.3*  MCV 92.8 92.6 93.4  PLT PLATELET CLUMPS NOTED ON SMEAR, UNABLE TO ESTIMATE 29* 31*   Basic Metabolic Panel: Recent Labs  Lab 05/21/21 1836 05/21/21 1923 05/22/21 0215 05/22/21 1015 05/22/21 1741 05/23/21 0206 05/23/21 1012  NA 135  --  135  --   --  137  --   K 3.8  --  4.1  --   --  4.1  --   CL 100  --  104  --   --  107  --   CO2 24  --  21*  --   --  17*  --   GLUCOSE 119*  --  100*  --   --  155*  --   BUN 48*  --  48*  --   --  52*  --   CREATININE 4.14*  --  3.92*  --   --  3.94*  --   CALCIUM 11.9*  --  11.5*   11.5* 11.2* 10.2 9.3 8.8*  MG  --  2.9* 2.9*  --   --   --   --   PHOS  --  5.3* 5.7*  --   --   --   --    GFR: Estimated Creatinine Clearance: 21.9 mL/min (A) (by C-G formula based on SCr of 3.94 mg/dL (H)). Liver Function Tests: Recent Labs  Lab 05/21/21 1920 05/22/21 0215 05/23/21 0206  AST 16 14* 15  ALT _0 ALKPHOS 68 59 60  BILITOT 0.4 0.4 0.5  PROT 5.8* 5.3* 5.5*  ALBUMIN 2.9* 2.6* 2.8*   Recent Labs  Lab 05/21/21 1920  LIPASE 24   No results for input(s): AMMONIA in the last 168 hours. Coagulation Profile: Recent Labs  Lab 05/22/21 0215  INR 1.1   Cardiac Enzymes: Recent Labs  Lab 05/21/21 1920  CKTOTAL 118   BNP (last 3 results) No results for input(s): PROBNP in the last 8760 hours. HbA1C: No results for input(s): HGBA1C in the last 72 hours. CBG: No results for input(s): GLUCAP in the last 168 hours. Lipid Profile: No results for input(s): CHOL, HDL, LDLCALC, TRIG, CHOLHDL, LDLDIRECT in the last 72 hours. Thyroid Function Tests: Recent Labs    05/21/21 1923  TSH 1.642   Anemia Panel: No results for input(s): VITAMINB12,  FOLATE, FERRITIN, TIBC, IRON, RETICCTPCT in the last 72 hours. Urine analysis:    Component Value Date/Time   COLORURINE YELLOW 05/21/2021 1850   APPEARANCEUR CLEAR 05/21/2021 1850   LABSPEC 1.017 05/21/2021 1850   PHURINE 6.0 05/21/2021 1850   GLUCOSEU NEGATIVE 05/21/2021 1850   HGBUR MODERATE (A) 05/21/2021 1850   BILIRUBINUR NEGATIVE 05/21/2021 1850   KETONESUR NEGATIVE 05/21/2021 1850   PROTEINUR 30 (A) 05/21/2021 1850   NITRITE NEGATIVE 05/21/2021 1850   LEUKOCYTESUR NEGATIVE 05/21/2021 1850   Sepsis Labs: _1 (procalcitonin:4,lacticidven:4)  ) Recent Results (from the past 240 hour(s))  Resp Panel by RT-PCR (Flu A&B, Covid) Nasopharyngeal  Swab     Status: None   Collection Time: 05/21/21  9:23 PM   Specimen: Nasopharyngeal Swab; Nasopharyngeal(NP) swabs in vial transport medium  Result Value Ref Range Status   SARS Coronavirus 2 by RT PCR NEGATIVE NEGATIVE Final    Comment: (NOTE) SARS-CoV-2 target nucleic acids are NOT DETECTED.  The SARS-CoV-2 RNA is generally detectable in upper respiratory specimens during the acute phase of infection. The lowest concentration of SARS-CoV-2 viral copies this assay can detect is 138 copies/mL. A negative result does not preclude SARS-Cov-2 infection and should not be used as the sole basis for treatment or other patient management decisions. A negative result may occur with  improper specimen collection/handling, submission of specimen other than nasopharyngeal swab, presence of viral mutation(s) within the areas targeted by this assay, and inadequate number of viral copies(<138 copies/mL). A negative result must be combined with clinical observations, patient history, and epidemiological information. The expected result is Negative.  Fact Sheet for Patients:  EntrepreneurPulse.com.au  Fact Sheet for Healthcare Providers:  IncredibleEmployment.be  This test is no t yet approved or  cleared by the [place] FDA and  has been authorized for detection and/or diagnosis of SARS-CoV-2 by FDA under an Emergency Use Authorization (EUA). This EUA will remain  in effect (meaning this test can be used) for the duration of the COVID-19 declaration under Section 564(b)(1) of the Act, 21 U.S.C.section 360bbb-3(b)(1), unless the authorization is terminated  or revoked sooner.       Influenza A by PCR NEGATIVE NEGATIVE Final   Influenza B by PCR NEGATIVE NEGATIVE Final    Comment: (NOTE) The Xpert Xpress SARS-CoV-2/FLU/RSV plus assay is intended as an aid in the diagnosis of influenza from Nasopharyngeal swab specimens and should not be used as a sole basis for treatment. Nasal washings and aspirates are unacceptable for Xpert Xpress SARS-CoV-2/FLU/RSV testing.  Fact Sheet for Patients: EntrepreneurPulse.com.au  Fact Sheet for Healthcare Providers: IncredibleEmployment.be  This test is not yet approved or cleared by the [place] FDA and has been authorized for detection and/or diagnosis of SARS-CoV-2 by FDA under an Emergency Use Authorization (EUA). This EUA will remain in effect (meaning this test can be used) for the duration of the COVID-19 declaration under Section 564(b)(1) of the Act, 21 U.S.C. section 360bbb-3(b)(1), unless the authorization is terminated or revoked.  Performed at [facility], [place] [address], [place], [place] [address]       Studies: No results found.  Scheduled Meds:  bortezomib SQ  1.3 mg/m2 (Treatment Plan Recorded) Subcutaneous Once   famotidine  20 mg Oral Daily   lidocaine       nicotine  14 mg Transdermal Daily    Continuous Infusions:  sodium chloride 100 mL/hr at 05/23/21 0347   cefTRIAXone (ROCEPHIN)  IV 2 g (05/23/21 1329)   metronidazole 500 mg (05/23/21 1027)   promethazine (PHENERGAN) injection (IM or IVPB)       LOS: 2 days     Alma Friendly, MD [facility]  If 7PM-7AM, please contact night-coverage [email or web address] 05/23/2021, 3:24 PM

## 2021-05-23 NOTE — Progress Notes (Signed)
[facility] ROUNDING NOTE   Subjective:   Interval History: 51 year old gentleman admitted with hypercalcemia and renal insufficiency.  Was admitted for IV saline and work-up for multiple myeloma.  Proceeding with bone marrow biopsy.  Blood pressure 116/62 pulse 68 temperature 97.5 O2 sats 93% room air.  Urine output 1.8 L 05/22/2021.  Sodium 137 potassium 4.1 chloride 107 CO2 17 BUN 52 creatinine 3.94 glucose 155 albumin 2.8 AST 15 ALT 20 hemoglobin 12.2.  Calcium improved to 9.3    Objective:  Vital signs in last 24 hours:  Temp:  [96.4 F (35.8 C)-98 F (36.7 C)] 97.5 F (36.4 C) (03/06 0411) Pulse Rate:  [63-89] 68 (03/06 0411) Resp:  [16-18] 17 (03/06 0411) BP: (116-124)/(62-72) 116/62 (03/06 0411) SpO2:  [93 %-98 %] 93 % (03/06 0411)  Weight change:  Filed Weights   05/21/21 1855  Weight: 77.1 kg    Intake/Output: I/O last 3 completed shifts: In: 2031.6 [P.O.:834; I.V.:638.5; IV Piggyback:559.2] Out: 4098 [Urine:1070; Emesis/NG output:200; Stool:1]   Intake/Output this shift:  Total I/O In: 1396.9 [P.O.:840; I.V.:456.9; IV Piggyback:100] Out: 1100 [Urine:1100]  CVS- RRR no murmurs rubs or gallops RS- CTA fine basal crackles ABD- BS present soft non-distended EXT- no edema   Basic Metabolic Panel: Recent Labs  Lab 05/21/21 1836 05/21/21 1923 05/22/21 0215 05/22/21 1015 05/22/21 1741 05/23/21 0206  NA 135  --  135  --   --  137  K 3.8  --  4.1  --   --  4.1  CL 100  --  104  --   --  107  CO2 24  --  21*  --   --  17*  GLUCOSE 119*  --  100*  --   --  155*  BUN 48*  --  48*  --   --  52*  CREATININE 4.14*  --  3.92*  --   --  3.94*  CALCIUM 11.9*  --  11.5*   11.5* 11.2* 10.2 9.3  MG  --  2.9* 2.9*  --   --   --   PHOS  --  5.3* 5.7*  --   --   --     Liver Function Tests: Recent Labs  Lab 05/21/21 1920 05/22/21 0215 05/23/21 0206  AST 16 14* 15  ALT _0 ALKPHOS 68 59 60  BILITOT 0.4 0.4 0.5  PROT 5.8* 5.3* 5.5*   ALBUMIN 2.9* 2.6* 2.8*   Recent Labs  Lab 05/21/21 1920  LIPASE 24   No results for input(s): AMMONIA in the last 168 hours.  CBC: Recent Labs  Lab 05/21/21 1836 05/22/21 0215 05/23/21 0206  WBC 12.9* 10.4 10.6*  NEUTROABS 6.7 4.0 7.1  HGB 12.6* 11.3* 12.2*  HCT 36.1* 31.5* 35.3*  MCV 92.8 92.6 93.4  PLT PLATELET CLUMPS NOTED ON SMEAR, UNABLE TO ESTIMATE 29* 31*    Cardiac Enzymes: Recent Labs  Lab 05/21/21 1920  CKTOTAL 118    BNP: Invalid input(s): POCBNP  CBG: No results for input(s): GLUCAP in the last 168 hours.  Microbiology: Results for orders placed or performed during the hospital encounter of 05/21/21  Resp Panel by RT-PCR (Flu A&B, Covid) Nasopharyngeal Swab     Status: None   Collection Time: 05/21/21  9:23 PM   Specimen: Nasopharyngeal Swab; Nasopharyngeal(NP) swabs in vial transport medium  Result Value Ref Range Status   SARS Coronavirus 2 by RT PCR NEGATIVE NEGATIVE Final    Comment: (NOTE) SARS-CoV-2 target  nucleic acids are NOT DETECTED.  The SARS-CoV-2 RNA is generally detectable in upper respiratory specimens during the acute phase of infection. The lowest concentration of SARS-CoV-2 viral copies this assay can detect is 138 copies/mL. A negative result does not preclude SARS-Cov-2 infection and should not be used as the sole basis for treatment or other patient management decisions. A negative result may occur with  improper specimen collection/handling, submission of specimen other than nasopharyngeal swab, presence of viral mutation(s) within the areas targeted by this assay, and inadequate number of viral copies(<138 copies/mL). A negative result must be combined with clinical observations, patient history, and epidemiological information. The expected result is Negative.  Fact Sheet for Patients:  EntrepreneurPulse.com.au  Fact Sheet for Healthcare Providers:  IncredibleEmployment.be  This  test is no t yet approved or cleared by the [place] FDA and  has been authorized for detection and/or diagnosis of SARS-CoV-2 by FDA under an Emergency Use Authorization (EUA). This EUA will remain  in effect (meaning this test can be used) for the duration of the COVID-19 declaration under Section 564(b)(1) of the Act, 21 U.S.C.section 360bbb-3(b)(1), unless the authorization is terminated  or revoked sooner.       Influenza A by PCR NEGATIVE NEGATIVE Final   Influenza B by PCR NEGATIVE NEGATIVE Final    Comment: (NOTE) The Xpert Xpress SARS-CoV-2/FLU/RSV plus assay is intended as an aid in the diagnosis of influenza from Nasopharyngeal swab specimens and should not be used as a sole basis for treatment. Nasal washings and aspirates are unacceptable for Xpert Xpress SARS-CoV-2/FLU/RSV testing.  Fact Sheet for Patients: EntrepreneurPulse.com.au  Fact Sheet for Healthcare Providers: IncredibleEmployment.be  This test is not yet approved or cleared by the [place] FDA and has been authorized for detection and/or diagnosis of SARS-CoV-2 by FDA under an Emergency Use Authorization (EUA). This EUA will remain in effect (meaning this test can be used) for the duration of the COVID-19 declaration under Section 564(b)(1) of the Act, 21 U.S.C. section 360bbb-3(b)(1), unless the authorization is terminated or revoked.  Performed at [facility], [place] [address], [place], [place] [address]     Coagulation Studies: Recent Labs    05/22/21 0215  LABPROT 14.4  INR 1.1    Urinalysis: Recent Labs    05/21/21 1850  COLORURINE YELLOW  LABSPEC 1.017  PHURINE 6.0  GLUCOSEU NEGATIVE  HGBUR MODERATE*  BILIRUBINUR NEGATIVE  KETONESUR NEGATIVE  PROTEINUR 30*  NITRITE NEGATIVE  LEUKOCYTESUR NEGATIVE      Imaging: CT ABDOMEN PELVIS WO CONTRAST  Result Date: 05/21/2021 CLINICAL DATA:  Flank and abdominal pain. Renal failure.  Patient reports low back pain with nausea, vomiting, in frequent urination since fall in the kitchen on February 9th. EXAM: CT ABDOMEN AND PELVIS WITHOUT CONTRAST TECHNIQUE: Multidetector CT imaging of the abdomen and pelvis was performed following the standard protocol without IV contrast. RADIATION DOSE REDUCTION: This exam was performed according to the departmental dose-optimization program which includes automated exposure control, adjustment of the mA and/or kV according to patient size and/or use of iterative reconstruction technique. COMPARISON:  None. FINDINGS: Lower chest: Clear lung bases without focal airspace disease or pleural effusion. The heart is normal in size. Hepatobiliary: Suggestion of mild hepatic steatosis. There is no focal liver abnormality on this unenhanced exam. Decompressed gallbladder without calcified gallstone. No biliary dilatation. Pancreas: No ductal dilatation or inflammation. Spleen: Upper normal in size spanning 11.7 x 6.7 x 12.4 cm (volume = 510 cm^3). No focal splenic  abnormality or perisplenic fluid. Adrenals/Urinary Tract: Normal adrenal glands. No hydronephrosis or renal calculi. No perinephric edema. No evidence of focal renal lesion. Decompressed ureters. No ureteral stone. Unremarkable urinary bladder. Stomach/Bowel: Decompressed stomach. Occasional fluid-filled small bowel without obstruction, inflammation or abnormal distention. Normal appendix. Diverticulosis from the distal transverse colon distally. There is equivocal faint stranding about a diverticulum involving the descending colon, series 3, image 46. No other diverticulitis or acute colonic inflammation Vascular/Lymphatic: Moderate aortic atherosclerosis, advanced for age. No portal venous or mesenteric gas. No bulky abdominopelvic adenopathy. Reproductive: Prostate is unremarkable. Other: No free air or free fluid. There is a small fat containing umbilical hernia. Musculoskeletal: Lumbar spine assessed on  lumbar spine reformats, reported separately. Heterogeneous marrow of the bony pelvis. Questionable lucent lesion within the right posterior lower eleventh rib, series 3, images 21 and 22 no fracture. Included musculature is unremarkable. IMPRESSION: 1. No renal stones or obstructive uropathy. 2. Equivocal faint stranding about a diverticulum involving the descending colon, which may represent mild diverticulitis. Multiple additional colonic diverticula from the splenic flexure distally. 3. Suggestion of mild hepatic steatosis. 4. Small fat containing umbilical hernia. 5. There is a questionable lucent lesion within the right posterior eleventh rib. This is nonspecific. Consider elective bone scan for further assessment Aortic Atherosclerosis (ICD10-I70.0). Electronically Signed   By: [name] Rake M.D.   On: 05/21/2021 20:34   DG Chest 2 View  Result Date: 05/21/2021 CLINICAL DATA:  Chest pain, history of prior rib fracture, initial encounter EXAM: CHEST - 2 VIEW COMPARISON:  None. FINDINGS: Cardiac shadow is within normal limits. Aortic calcifications are noted. The lungs are well aerated without focal infiltrate, effusion or pneumothorax. No discrete rib fracture is noted. Mild inferior endplate deformity is noted in the lower thoracic spine but appears chronic in nature. Correlate to point tenderness. No other focal abnormality is noted. IMPRESSION: No discrete rib fracture is noted.  No pneumothorax is seen. Inferior endplate deformity in the lower thoracic spine which appears chronic in nature. Correlate to point tenderness. No other focal abnormality is noted. Electronically Signed   By: Inez Catalina M.D.   On: 05/21/2021 19:27   CT L-SPINE NO CHARGE  Addendum Date: 05/22/2021   ADDENDUM REPORT: 05/22/2021 14:45 ADDENDUM: Upon further review, in addition to the L3 lucent lesion mentioned in the initial exam, bone marrow of the lumbar spine appears heterogeneous with suggestion of multiple small lucent  lesions. Consulting provider has suspicion for multiple myeloma, and this may represent diffuse myelomatous involvement. Electronically Signed   By: [name] Rake M.D.   On: 05/22/2021 14:45   Result Date: 05/22/2021 CLINICAL DATA:  Low back pain. EXAM: CT LUMBAR SPINE WITHOUT CONTRAST TECHNIQUE: Multidetector CT imaging of the lumbar spine was performed without intravenous contrast administration. Multiplanar CT image reconstructions were also generated. RADIATION DOSE REDUCTION: This exam was performed according to the departmental dose-optimization program which includes automated exposure control, adjustment of the mA and/or kV according to patient size and/or use of iterative reconstruction technique. COMPARISON:  None. FINDINGS: Segmentation: 5 lumbar type vertebrae. Alignment: Normal. Vertebrae: No fracture. There is 6 mm lucency involving the anterior aspect of L3 superior endplate. Background bone marrow is heterogeneous. Paraspinal and other soft tissues: Assessed on concurrent abdominopelvic CT, reported separately. Disc levels: Minor disc bulge at L4-L5 and L5-S1. No spinal canal or neural foraminal narrowing. IMPRESSION: 1. No fracture or subluxation of the lumbar spine. 2. Heterogeneous bone marrow with a 6 mm lucency involving the anterior aspect  of L3 superior endplate. Findings are nonspecific and may be related to osteopenia. MRI could be considered for further evaluation. 3. Minor disc bulge at L4-L5 and L5-S1. No spinal canal or neural foraminal narrowing. Electronically Signed: By: [name] Rake M.D. On: 05/21/2021 20:39     Medications:    sodium chloride 100 mL/hr at 05/23/21 0347   cefTRIAXone (ROCEPHIN)  IV Stopped (05/22/21 1507)   metronidazole 500 mg (05/22/21 2236)    calcitonin  4 Units/kg Subcutaneous BID   famotidine  20 mg Oral Daily   nicotine  14 mg Transdermal Daily   acetaminophen **OR** acetaminophen, HYDROcodone-acetaminophen, ondansetron (ZOFRAN)  IV  Assessment/ Plan:  Renal insufficiency with unclear chronicity.  Suspected to be secondary to multiple myeloma.  Bone marrow biopsy per oncology.  Appreciate assistance from Dr. Benay Spice. Hypercalcemia.  Calcitonin and IV hydration.  Improvement noted. Continue to avoid nephrotoxins.  No indication acute dialysis at this point.    LOS: [place] _0 _1 :51 AM

## 2021-05-23 NOTE — Progress Notes (Signed)
Patient received chemo treatment today for the first time. He is being seen by Dr Benay Spice. Patient received velcade. Patient tolerated treatment well with no issues. ? ?

## 2021-05-23 NOTE — Progress Notes (Signed)
IP PROGRESS NOTE  Subjective:   [name] continues to have nausea.  He had erythema of the hands last night.  He was treated with Decadron and medronate yesterday.  Objective: Vital signs in last 24 hours: Blood pressure 132/63, pulse (!) 57, temperature 97.7 F (36.5 C), temperature source Oral, resp. rate 16, height 5' 8"  (1.727 m), weight 170 lb (77.1 kg), SpO2 94 %.  Intake/Output from previous day: 03/05 0701 - 03/06 0700 In: 3621.3 [P.O.:1674; I.V.:1488.1; IV Piggyback:459.2] Out: 2451 [Urine:2250; Emesis/NG output:200; Stool:1]  Physical Exam:  HEENT: No thrush Cardiac: Regular rate and rhythm Abdomen: Soft, no hepatosplenomegaly, nontender Extremities: No leg edema Skin: Mild erythema of the palms  Lab Results: Recent Labs    05/22/21 0215 05/23/21 0206  WBC 10.4 10.6*  HGB 11.3* 12.2*  HCT 31.5* 35.3*  PLT 29* 31*    BMET Recent Labs    05/22/21 0215 05/22/21 1015 05/23/21 0206 05/23/21 1012  NA 135  --  137  --   K 4.1  --  4.1  --   CL 104  --  107  --   CO2 21*  --  17*  --   GLUCOSE 100*  --  155*  --   BUN 48*  --  52*  --   CREATININE 3.92*  --  3.94*  --   CALCIUM 11.5*   11.5*   < > 9.3 8.8*   < > = values in this interval not displayed.    No results found for: CEA1, CEA, [id number], CA125  Studies/Results: CT ABDOMEN PELVIS WO CONTRAST  Result Date: 05/21/2021 CLINICAL DATA:  Flank and abdominal pain. Renal failure. Patient reports low back pain with nausea, vomiting, in frequent urination since fall in the kitchen on February 9th. EXAM: CT ABDOMEN AND PELVIS WITHOUT CONTRAST TECHNIQUE: Multidetector CT imaging of the abdomen and pelvis was performed following the standard protocol without IV contrast. RADIATION DOSE REDUCTION: This exam was performed according to the departmental dose-optimization program which includes automated exposure control, adjustment of the mA and/or kV according to patient size and/or use of iterative reconstruction  technique. COMPARISON:  None. FINDINGS: Lower chest: Clear lung bases without focal airspace disease or pleural effusion. The heart is normal in size. Hepatobiliary: Suggestion of mild hepatic steatosis. There is no focal liver abnormality on this unenhanced exam. Decompressed gallbladder without calcified gallstone. No biliary dilatation. Pancreas: No ductal dilatation or inflammation. Spleen: Upper normal in size spanning 11.7 x 6.7 x 12.4 cm (volume = 510 cm^3). No focal splenic abnormality or perisplenic fluid. Adrenals/Urinary Tract: Normal adrenal glands. No hydronephrosis or renal calculi. No perinephric edema. No evidence of focal renal lesion. Decompressed ureters. No ureteral stone. Unremarkable urinary bladder. Stomach/Bowel: Decompressed stomach. Occasional fluid-filled small bowel without obstruction, inflammation or abnormal distention. Normal appendix. Diverticulosis from the distal transverse colon distally. There is equivocal faint stranding about a diverticulum involving the descending colon, series 3, image 46. No other diverticulitis or acute colonic inflammation Vascular/Lymphatic: Moderate aortic atherosclerosis, advanced for age. No portal venous or mesenteric gas. No bulky abdominopelvic adenopathy. Reproductive: Prostate is unremarkable. Other: No free air or free fluid. There is a small fat containing umbilical hernia. Musculoskeletal: Lumbar spine assessed on lumbar spine reformats, reported separately. Heterogeneous marrow of the bony pelvis. Questionable lucent lesion within the right posterior lower eleventh rib, series 3, images 21 and 22 no fracture. Included musculature is unremarkable. IMPRESSION: 1. No renal stones or obstructive uropathy. 2. Equivocal faint stranding about  a diverticulum involving the descending colon, which may represent mild diverticulitis. Multiple additional colonic diverticula from the splenic flexure distally. 3. Suggestion of mild hepatic steatosis. 4.  Small fat containing umbilical hernia. 5. There is a questionable lucent lesion within the right posterior eleventh rib. This is nonspecific. Consider elective bone scan for further assessment Aortic Atherosclerosis (ICD10-I70.0). Electronically Signed   By: [name] Rake M.D.   On: 05/21/2021 20:34   DG Chest 2 View  Result Date: 05/21/2021 CLINICAL DATA:  Chest pain, history of prior rib fracture, initial encounter EXAM: CHEST - 2 VIEW COMPARISON:  None. FINDINGS: Cardiac shadow is within normal limits. Aortic calcifications are noted. The lungs are well aerated without focal infiltrate, effusion or pneumothorax. No discrete rib fracture is noted. Mild inferior endplate deformity is noted in the lower thoracic spine but appears chronic in nature. Correlate to point tenderness. No other focal abnormality is noted. IMPRESSION: No discrete rib fracture is noted.  No pneumothorax is seen. Inferior endplate deformity in the lower thoracic spine which appears chronic in nature. Correlate to point tenderness. No other focal abnormality is noted. Electronically Signed   By: Inez Catalina M.D.   On: 05/21/2021 19:27   CT L-SPINE NO CHARGE  Addendum Date: 05/22/2021   ADDENDUM REPORT: 05/22/2021 14:45 ADDENDUM: Upon further review, in addition to the L3 lucent lesion mentioned in the initial exam, bone marrow of the lumbar spine appears heterogeneous with suggestion of multiple small lucent lesions. Consulting provider has suspicion for multiple myeloma, and this may represent diffuse myelomatous involvement. Electronically Signed   By: [name] Rake M.D.   On: 05/22/2021 14:45   Result Date: 05/22/2021 CLINICAL DATA:  Low back pain. EXAM: CT LUMBAR SPINE WITHOUT CONTRAST TECHNIQUE: Multidetector CT imaging of the lumbar spine was performed without intravenous contrast administration. Multiplanar CT image reconstructions were also generated. RADIATION DOSE REDUCTION: This exam was performed according to the  departmental dose-optimization program which includes automated exposure control, adjustment of the mA and/or kV according to patient size and/or use of iterative reconstruction technique. COMPARISON:  None. FINDINGS: Segmentation: 5 lumbar type vertebrae. Alignment: Normal. Vertebrae: No fracture. There is 6 mm lucency involving the anterior aspect of L3 superior endplate. Background bone marrow is heterogeneous. Paraspinal and other soft tissues: Assessed on concurrent abdominopelvic CT, reported separately. Disc levels: Minor disc bulge at L4-L5 and L5-S1. No spinal canal or neural foraminal narrowing. IMPRESSION: 1. No fracture or subluxation of the lumbar spine. 2. Heterogeneous bone marrow with a 6 mm lucency involving the anterior aspect of L3 superior endplate. Findings are nonspecific and may be related to osteopenia. MRI could be considered for further evaluation. 3. Minor disc bulge at L4-L5 and L5-S1. No spinal canal or neural foraminal narrowing. Electronically Signed: By: [name] Rake M.D. On: 05/21/2021 20:39    Medications: I have reviewed the patient's current medications.  Assessment/Plan: Renal failure Hypercalcemia-status post treat with calcitonin, pamidronate 05/22/2021 Lytic bone lesions Anemia Thrombocytopenia  [name] appears unchanged.  The calcium is better.  We will discontinue calcitonin.  He is scheduled for a diagnostic bone marrow biopsy today.  The plan is to initiate Velcade/Decadron therapy if the bone marrow is consistent with multiple myeloma.  The serum protein electrophoresis, serum immunofixation, and serum light chain results are pending.  He completed 1 dose of Decadron yesterday.  I reviewed potential toxicities associated with Velcade including the chance of nausea/vomiting, diarrhea, hematologic toxicity, and neuropathy.  He agrees to proceed.  The hand  erythema is likely related to flushing from calcitonin.  Recommendations: Bone marrow biopsy  today Discontinue calcitonin Follow-up bone marrow aspirate today, initiate Velcade/Decadron if aspirate is consistent with myeloma Follow-up results of myeloma panel Continue intravenous hydration  LOS: 2 days   Betsy Coder, MD   05/23/2021, 11:42 AM

## 2021-05-23 NOTE — TOC Initial Note (Signed)
Transition of Care (TOC) - Initial/Assessment Note  ? ? ?Patient Details  ?Name: Jordan King ?MRN: [id number] ?Date of Birth: 02-Sep-1970 ? ?Transition of Care (TOC) CM/SW Contact:    ?Tom-Johnson, Renea Ee, RN ?Phone Number: ?05/23/2021, 11:29 AM ? ?Clinical Narrative:                 ? ?CM spoke with patient and mother at bedside about needs for post hospital transition. Admitted for AKI. From home with a a room mate. Does not have any children. Currently employed at [employer]. Independent with care and drive self prior to admission. Does not have DME's at home. PCP is [place], Anette Guarneri, MD and uses [facility] in [place]. No recommendations noted at this time and denies ay needs. CM advised patient to call if any needs arises. Mother to transport at discharge. CM will continue to follow with needs. ?  ?Barriers to Discharge: Continued Medical Work up ? ? ?Patient Goals and CMS Choice ?Patient states their goals for this hospitalization and ongoing recovery are:: To return home ?CMS Medicare.gov Compare Post Acute Care list provided to:: Patient ?Choice offered to / list presented to : NA ? ?Expected Discharge Plan and Services ?  ?  ?Discharge Planning Services: CM Consult ?Post Acute Care Choice: NA ?Living arrangements for the past 2 months: [place] ?                ?  ?  ?  ?  ?  ?  ?  ?  ?  ?  ? ?Prior Living Arrangements/Services ?Living arrangements for the past 2 months: [place] ?Lives with:: Roommate ?Patient language and need for interpreter reviewed:: Yes ?Do you feel safe going back to the place where you live?: Yes      ?Need for Family Participation in Patient Care: Yes (Comment) ?Care giver support system in place?: Yes (comment) ?  ?Criminal Activity/Legal Involvement Pertinent to Current Situation/Hospitalization: No - Comment as needed ? ?Activities of Daily Living ?Home Assistive Devices/Equipment: None ?ADL Screening (condition at time of  admission) ?Patient's cognitive ability adequate to safely complete daily activities?: Yes ?Is the patient deaf or have difficulty hearing?: No ?Does the patient have difficulty seeing, even when wearing glasses/contacts?: No ?Does the patient have difficulty concentrating, remembering, or making decisions?: No ?Patient able to express need for assistance with ADLs?: Yes ?Does the patient have difficulty dressing or bathing?: No ?Independently performs ADLs?: Yes (appropriate for developmental age) ?Does the patient have difficulty walking or climbing stairs?: No ?Weakness of Legs: None ?Weakness of Arms/Hands: None ? ?Permission Sought/Granted ?Permission sought to share information with : Case Manager, Family Supports ?Permission granted to share information with : Yes, Verbal Permission Granted ?   ?   ?   ?   ? ?Emotional Assessment ?Appearance:: Appears stated age ?Attitude/Demeanor/Rapport: Engaged, Gracious ?Affect (typically observed): Accepting, Appropriate, Calm, Hopeful ?Orientation: : Oriented to Self, Oriented to Place, Oriented to  Time, Oriented to Situation ?Alcohol / Substance Use: Not Applicable ?Psych Involvement: No (comment) ? ?Admission diagnosis:  Hypercalcemia [E83.52] ?Bone lesion [M89.9] ?AKI (acute kidney injury) ([place]) [N17.9] ?Renal failure, unspecified chronicity [N19] ?Patient Active Problem List  ? Diagnosis Date Noted  ? AKI (acute kidney injury) ([place]) 05/21/2021  ? Hypercalcemia 05/21/2021  ? Lytic bone lesions on xray 05/21/2021  ? Diverticulitis 05/21/2021  ? Dehydration 05/21/2021  ? Tobacco abuse 05/21/2021  ? ?PCP:  Pcp, No ?Pharmacy:  No Pharmacies Listed ? ? ? ?  Social Determinants of Health (SDOH) Interventions ?  ? ?Readmission Risk Interventions ?No flowsheet data found. ? ? ?

## 2021-05-24 ENCOUNTER — Encounter: Payer: Self-pay | Admitting: Oncology

## 2021-05-24 ENCOUNTER — Other Ambulatory Visit: Payer: Self-pay | Admitting: *Deleted

## 2021-05-24 ENCOUNTER — Inpatient Hospital Stay (HOSPITAL_COMMUNITY): Payer: BLUE CROSS/BLUE SHIELD

## 2021-05-24 DIAGNOSIS — M899 Disorder of bone, unspecified: Secondary | ICD-10-CM | POA: Diagnosis not present

## 2021-05-24 DIAGNOSIS — K5792 Diverticulitis of intestine, part unspecified, without perforation or abscess without bleeding: Secondary | ICD-10-CM | POA: Diagnosis not present

## 2021-05-24 DIAGNOSIS — N179 Acute kidney failure, unspecified: Secondary | ICD-10-CM | POA: Diagnosis not present

## 2021-05-24 DIAGNOSIS — C9000 Multiple myeloma not having achieved remission: Secondary | ICD-10-CM

## 2021-05-24 LAB — CBC WITH DIFFERENTIAL/PLATELET
Abs Immature Granulocytes: 0.04 10*3/uL (ref 0.00–0.07)
Basophils Absolute: 0 10*3/uL (ref 0.0–0.1)
Basophils Relative: 0 %
Eosinophils Absolute: 0 10*3/uL (ref 0.0–0.5)
Eosinophils Relative: 0 %
HCT: 32.1 % — ABNORMAL LOW (ref 39.0–52.0)
Hemoglobin: 11.2 g/dL — ABNORMAL LOW (ref 13.0–17.0)
Immature Granulocytes: 1 %
Lymphocytes Relative: 5 %
Lymphs Abs: 0.3 10*3/uL — ABNORMAL LOW (ref 0.7–4.0)
MCH: 32.1 pg (ref 26.0–34.0)
MCHC: 34.9 g/dL (ref 30.0–36.0)
MCV: 92 fL (ref 80.0–100.0)
Monocytes Absolute: 0.2 10*3/uL (ref 0.1–1.0)
Monocytes Relative: 3 %
Neutro Abs: 4.3 10*3/uL (ref 1.7–7.7)
Neutrophils Relative %: 91 %
Platelets: 22 10*3/uL — CL (ref 150–400)
RBC: 3.49 MIL/uL — ABNORMAL LOW (ref 4.22–5.81)
RDW: 15 % (ref 11.5–15.5)
WBC: 4.8 10*3/uL (ref 4.0–10.5)
nRBC: 0 % (ref 0.0–0.2)

## 2021-05-24 LAB — COMPREHENSIVE METABOLIC PANEL
ALT: 18 U/L (ref 0–44)
AST: 18 U/L (ref 15–41)
Albumin: 2.6 g/dL — ABNORMAL LOW (ref 3.5–5.0)
Alkaline Phosphatase: 48 U/L (ref 38–126)
Anion gap: 11 (ref 5–15)
BUN: 52 mg/dL — ABNORMAL HIGH (ref 6–20)
CO2: 17 mmol/L — ABNORMAL LOW (ref 22–32)
Calcium: 7.6 mg/dL — ABNORMAL LOW (ref 8.9–10.3)
Chloride: 108 mmol/L (ref 98–111)
Creatinine, Ser: 3 mg/dL — ABNORMAL HIGH (ref 0.61–1.24)
GFR, Estimated: 25 mL/min — ABNORMAL LOW (ref >60)
Glucose, Bld: 96 mg/dL (ref 70–99)
Potassium: 3.5 mmol/L (ref 3.5–5.1)
Sodium: 136 mmol/L (ref 135–145)
Total Bilirubin: 0.4 mg/dL (ref 0.3–1.2)
Total Protein: 4.8 g/dL — ABNORMAL LOW (ref 6.5–8.1)

## 2021-05-24 LAB — FERRITIN: Ferritin: 583 ng/mL — ABNORMAL HIGH (ref 24–336)

## 2021-05-24 LAB — UPEP/UIFE/LIGHT CHAINS/TP, 24-HR UR
% BETA, Urine: 8.5 %
ALPHA 1 URINE: 3 %
Albumin, U: 3.7 %
Alpha 2, Urine: 3.3 %
Free Kappa Lt Chains,Ur: 42.76 mg/L (ref 1.17–86.46)
Free Kappa/Lambda Ratio: 0 — ABNORMAL LOW (ref 1.83–14.26)
GAMMA GLOBULIN URINE: 81.5 %
M-SPIKE %, Urine: 79.3 % — ABNORMAL HIGH
M-Spike, Mg/24 Hr: 19908 mg/24 hr — ABNORMAL HIGH
Total Protein, Urine-Ur/day: 25105 mg/24 hr — ABNORMAL HIGH (ref 30–150)
Total Protein, Urine: 1091.5 mg/dL
Total Volume: 2300

## 2021-05-24 MED ORDER — NICOTINE 14 MG/24HR TD PT24
14.0000 mg | MEDICATED_PATCH | Freq: Once | TRANSDERMAL | Status: DC
  Administered 2021-05-24: 14 mg via TRANSDERMAL
  Filled 2021-05-24: qty 1

## 2021-05-24 MED ORDER — ALUM & MAG HYDROXIDE-SIMETH 200-200-20 MG/5ML PO SUSP
30.0000 mL | Freq: Four times a day (QID) | ORAL | Status: DC | PRN
  Administered 2021-05-24 – 2021-05-26 (×3): 30 mL via ORAL
  Filled 2021-05-24 (×3): qty 30

## 2021-05-24 MED ORDER — POLYETHYLENE GLYCOL 3350 17 G PO PACK: 17.0000 g | PACK | Freq: Every day | ORAL | Status: DC | PRN

## 2021-05-24 MED ORDER — POLYETHYLENE GLYCOL 3350 17 G PO PACK
17.0000 g | PACK | Freq: Every day | ORAL | Status: DC
  Filled 2021-05-24: qty 1

## 2021-05-24 MED ORDER — PANTOPRAZOLE SODIUM 40 MG PO TBEC
40.0000 mg | DELAYED_RELEASE_TABLET | Freq: Every day | ORAL | Status: DC
  Administered 2021-05-24 – 2021-05-27 (×4): 40 mg via ORAL
  Filled 2021-05-24 (×4): qty 1

## 2021-05-24 NOTE — Progress Notes (Addendum)
IP PROGRESS NOTE ? ?Subjective:  ? ?[name] reported some subxiphoid discomfort earlier today.  He has been started on Protonix.  He also has as needed Maalox available to him.  He had a bone survey performed earlier today-results pending.  He reported that he was uncomfortable sitting on the table for the x-rays.  He reported a bowel movement while in x-ray earlier today. ? ?Objective: ?Vital signs in last 24 hours: ?Blood pressure 115/66, pulse 86, temperature 98.5 ?F (36.9 ?C), temperature source Oral, resp. rate 18, height 5' 8"  (1.727 m), weight 77.1 kg, SpO2 97 %. ? ?Intake/Output from previous day: ?03/06 0701 - 03/07 0700 ?In: 2635.6 [P.O.:700; I.V.:1635.6; IV Piggyback:300] ?Out: 2230 [Urine:2230] ? ?Physical Exam: ? ?HEENT: No thrush ?Cardiac: Regular rate and rhythm ?Abdomen: Soft, no hepatosplenomegaly, nontender ?Extremities: No leg edema ?Skin: Mild erythema of the palms ? ?Lab Results: ?Recent Labs  ?  05/23/21 ?0206 05/24/21 ?3428  ?WBC 10.6* 4.8  ?HGB 12.2* 11.2*  ?HCT 35.3* 32.1*  ?PLT 31* 22*  ? ? ?BMET ?Recent Labs  ?  05/23/21 ?0206 05/23/21 ?1012 05/24/21 ?0434  ?NA 137  --  136  ?K 4.1  --  3.5  ?CL 107  --  108  ?CO2 17*  --  17*  ?GLUCOSE 155*  --  96  ?BUN 52*  --  52*  ?CREATININE 3.94*  --  3.00*  ?CALCIUM 9.3 8.8* 7.6*  ? ? ?No results found for: CEA1, CEA, [id number], CA125 ? ?Studies/Results: ?CT BONE MARROW BIOPSY & ASPIRATION ? ?Result Date: 05/23/2021 ?CLINICAL DATA:  Multiple myeloma evaluation EXAM: CT GUIDED DEEP ILIAC BONE ASPIRATION AND CORE BIOPSY TECHNIQUE: Patient was placed prone on the CT gantry and limited axial scans through the pelvis were obtained. This exam was performed according to the departmental dose-optimization program which includes automated exposure control, adjustment of the mA and/or kV according to patient size and/or use of iterative reconstruction technique. Appropriate skin entry site was identified. Skin site was marked, prepped with chlorhexidine, draped  in usual sterile fashion, and infiltrated locally with 1% lidocaine. Intravenous Fentanyl 60mg and Versed 1.528mwere administered as conscious sedation during continuous monitoring of the patient's level of consciousness and physiological / cardiorespiratory status by the radiology RN, with a total moderate sedation time of 10 minutes. Under CT fluoroscopic guidance an 11-gauge Cook trocar bone needle was advanced into the left iliac bone just lateral to the sacroiliac joint. Once needle tip position was confirmed, core and aspiration samples were obtained, submitted to pathology for approval. Patient tolerated procedure well. COMPLICATIONS: COMPLICATIONS none IMPRESSION: 1. Technically successful CT guided left iliac bone core and aspiration biopsy. Electronically Signed   By: D Lucrezia Europe.D.   On: 05/23/2021 16:13   ? ?Medications: I have reviewed the patient's current medications. ? ?Assessment/Plan: ?Multiple myeloma ?-05/22/2021-kappa free light chain 11.3, lambda free light chain 17,028 ?Renal failure ?Hypercalcemia-status post treat with calcitonin, pamidronate 05/22/2021 ?Lytic bone lesions ?Anemia ?Thrombocytopenia ? ?[name] unchanged.  Preliminary bone marrow biopsy result consistent with multiple myeloma.  His lambda free light chain was elevated.  UPEP, SPEP, immunofixation pending.  Today is day 2 cycle 1 of Velcade/dexamethasone.  He tolerated his treatment well overall.  Renal function improving.  Calcium has normalized.  We will plan for a day 4 cycle 1 of Velcade/dexamethasone on Thursday.  Bone survey has been ordered. ? ?He is having some reflux symptoms.  We will begin Protonix 40 mg daily.  He already has  Maalox available as needed.  He reported some constipation earlier today and MiraLAX was initially ordered.  However, he asked to cut back on this.  I have changed the MiraLAX to daily as needed. ? ?Recommendations: ?1.  Follow-up on SPEP and UPEP. ?2.  Follow-up on bone survey  results. ?3.  Plan for day 4 cycle 1 Velcade/dexamethasone on Thursday. ?4.  Give Protonix 40 mg daily. ?5.  MiraLAX daily as needed. ?6.  Decision on discontinuing antibiotics for "diverticulitis "per the medical service ? ?Jordan Bussing, NP  ? ?Jordan King was interviewed and examined.  I discussed the bone marrow aspirate with Dr. Melina Copa yesterday.  She confirmed the bone marrow appears to be involved with multiple myeloma.  He completed a first treatment with Velcade yesterday.  He tolerated the Velcade well. ?He complains of subxiphoid sharp discomfort.  This predated hospital admission.  The subxiphoid discomfort is likely related to reflux or gastroesophagitis.  He will begin pantoprazole. ? ?Jordan King says he plans to stay in [place] with his mother for now.  We will plan to continue outpatient [facility] at the [facility].  Hopefully he will be stable for discharge later this week. ? ?He has persistent severe thrombocytopenia, likely related to multiple myeloma involving the bone marrow.  We will follow the platelet count closely and transfuse platelets as needed. ? ?I was present for greater than 50% of today's visit.  I performed medical decision making. ? ?05/24/2021, 8:36 AM ?  ?

## 2021-05-24 NOTE — Progress Notes (Addendum)
PROGRESS NOTE  Jordan King KPQ:[id number] DOB: 09/15/1970 DOA: 05/21/2021 PCP: Ayesha Mohair, MD  HPI/Recap of past 95 hours: 51 year old male with a past medical history of asthma, tobacco abuse, presents to the ED with multiple complaints including 3-week history of pain around right sided upper chest, back, since falling in his kitchen at home.  Patient also reports recently for the past few days, persistent nausea and vomiting unable to keep any food down, epigastric tenderness/heartburn of which she has been consuming a lot of Tums, intermittent night sweats, malaise, generalized weakness.  Patient reports taking some ibuprofen due to his pain.  In the ED, [facility], labs showed creatinine of 4.1, BUN 48, WBC 12.9, hemoglobin 12.6, thrombocytopenia, calcium 11.9.  CT spine showed no fracture, but 6 mm lucency involving the anterior aspect of the L3, may be related to osteopenia, CT abdomen/pelvis showed faint stranding about a diverticulum involving the descending colon which may represent mild diverticulitis with questionable lucent lesion within the right posterior 11th rib.  Due to the above finding, urology and oncology were both consulted. Patient admitted for further management.     Today, denies any new complaints, continues to c/o epigastric pain. Will order RUQ USS.     Assessment/Plan: Principal Problem:   AKI (acute kidney injury) ([place]) Active Problems:   Hypercalcemia   Lytic bone lesions on xray   Diverticulitis   Dehydration   Tobacco abuse   Multiple myeloma (HCC)   Goals of care, counseling/discussion    Likely acute renal failure, but unclear chronicity Related to Multiple Myeloma Cr of 4.1, BUN 48 on admission UA with trace proteinuria, moderate hemoglobinuria Nephrology consulted, no further work-up for now from a renal standpoint Daily CMP  Hypercalcemia Mild hyperphosphatemia Resolved 11.9 on admission Continue IV fluids, s/p 1 dose of  bisphosphonate and calcitonin on 05/22/20, currently stopped as Ca has improved Daily CMP  Anemia, thrombocytopenia Bone biopsy noted for Multiple myeloma Oncology on board, started Velcade/decadron for treatment of MM on 05/23/21 Myeloma panel pending Daily CBC  Lytic bone lesions on x-ray Concern for myeloma Bone survey pending Pain management  ??Possible diverticulitis Noted on CT abdomen/pelvis Continue with IV antibiotics, Flagyl, ceftriaxone for a total of 5 days  Possible GERD/epigastric pain Continue PPI  Tobacco abuse Advised to quit  History of asthma Stable Inhalers as needed     Estimated body mass index is 25.85 kg/m as calculated from the following:   Height as of this encounter: 5' 8"  (1.727 m).   Weight as of this encounter: 77.1 kg.     Code Status: Full  Family Communication: Discussed with mother and girlfriend at bedside  Disposition Plan: Status is: Inpatient Remains inpatient appropriate because: Level of care     Consultants: Oncology Nephrology  Procedures: Bone marrow biopsy on 05/23/2021  Antimicrobials: Ceftriaxone Flagyl  DVT prophylaxis: SCDs given significant thrombocytopenia   Objective: Vitals:   05/24/21 0505 05/24/21 0952 05/24/21 1205 05/24/21 1636  BP: 115/66 138/66 122/74 116/73  Pulse: 86 94 93 78  Resp: 18 17 17 18   Temp: 98.5 F (36.9 C) 98.2 F (36.8 C)  98.4 F (36.9 C)  TempSrc: Oral Oral    SpO2: 97% 99% 97% 95%  Weight:      Height:        Intake/Output Summary (Last 24 hours) at 05/24/2021 1640 Last data filed at 05/24/2021 1400 Gross per 24 hour  Intake 2665.93 ml  Output 1730 ml  Net 935.93 ml  Filed Weights   05/21/21 1855  Weight: 77.1 kg    Exam: General: NAD Cardiovascular: S1, S2 present Respiratory: CTAB Abdomen: Soft, minimally tender, nondistended, bowel sounds present Musculoskeletal: No bilateral pedal edema noted Skin: As noted above Psychiatry: Normal mood     Data  Reviewed: CBC: Recent Labs  Lab 05/21/21 1836 05/22/21 0215 05/23/21 0206 05/24/21 0434  WBC 12.9* 10.4 10.6* 4.8  NEUTROABS 6.7 4.0 7.1 4.3  HGB 12.6* 11.3* 12.2* 11.2*  HCT 36.1* 31.5* 35.3* 32.1*  MCV 92.8 92.6 93.4 92.0  PLT PLATELET CLUMPS NOTED ON SMEAR, UNABLE TO ESTIMATE 29* 31* 22*   Basic Metabolic Panel: Recent Labs  Lab 05/21/21 1836 05/21/21 1923 05/22/21 0215 05/22/21 1015 05/22/21 1741 05/23/21 0206 05/23/21 1012 05/24/21 0434  NA 135  --  135  --   --  137  --  136  K 3.8  --  4.1  --   --  4.1  --  3.5  CL 100  --  104  --   --  107  --  108  CO2 24  --  21*  --   --  17*  --  17*  GLUCOSE 119*  --  100*  --   --  155*  --  96  BUN 48*  --  48*  --   --  52*  --  52*  CREATININE 4.14*  --  3.92*  --   --  3.94*  --  3.00*  CALCIUM 11.9*  --  11.5*   11.5* 11.2* 10.2 9.3 8.8* 7.6*  MG  --  2.9* 2.9*  --   --   --   --   --   PHOS  --  5.3* 5.7*  --   --   --   --   --    GFR: Estimated Creatinine Clearance: 28.8 mL/min (A) (by C-G formula based on SCr of 3 mg/dL (H)). Liver Function Tests: Recent Labs  Lab 05/21/21 1920 05/22/21 0215 05/23/21 0206 05/24/21 0434  AST 16 14* 15 18  ALT 20 18 20 18   ALKPHOS 68 59 60 48  BILITOT 0.4 0.4 0.5 0.4  PROT 5.8* 5.3* 5.5* 4.8*  ALBUMIN 2.9* 2.6* 2.8* 2.6*   Recent Labs  Lab 05/21/21 1920  LIPASE 24   No results for input(s): AMMONIA in the last 168 hours. Coagulation Profile: Recent Labs  Lab 05/22/21 0215  INR 1.1   Cardiac Enzymes: Recent Labs  Lab 05/21/21 1920  CKTOTAL 118   BNP (last 3 results) No results for input(s): PROBNP in the last 8760 hours. HbA1C: No results for input(s): HGBA1C in the last 72 hours. CBG: No results for input(s): GLUCAP in the last 168 hours. Lipid Profile: No results for input(s): CHOL, HDL, LDLCALC, TRIG, CHOLHDL, LDLDIRECT in the last 72 hours. Thyroid Function Tests: Recent Labs    05/21/21 1923  TSH 1.642   Anemia Panel: Recent Labs     05/24/21 1412  FERRITIN 583*   Urine analysis:    Component Value Date/Time   COLORURINE YELLOW 05/21/2021 1850   APPEARANCEUR CLEAR 05/21/2021 1850   LABSPEC 1.017 05/21/2021 1850   PHURINE 6.0 05/21/2021 1850   GLUCOSEU NEGATIVE 05/21/2021 1850   HGBUR MODERATE (A) 05/21/2021 1850   BILIRUBINUR NEGATIVE 05/21/2021 1850   KETONESUR NEGATIVE 05/21/2021 1850   PROTEINUR 30 (A) 05/21/2021 1850   NITRITE NEGATIVE 05/21/2021 1850   LEUKOCYTESUR NEGATIVE 05/21/2021 1850   Sepsis Labs: @LABRCNTIP (procalcitonin:4,lacticidven:4)  )  Recent Results (from the past 240 hour(s))  Resp Panel by RT-PCR (Flu A&B, Covid) Nasopharyngeal Swab     Status: None   Collection Time: 05/21/21  9:23 PM   Specimen: Nasopharyngeal Swab; Nasopharyngeal(NP) swabs in vial transport medium  Result Value Ref Range Status   SARS Coronavirus 2 by RT PCR NEGATIVE NEGATIVE Final    Comment: (NOTE) SARS-CoV-2 target nucleic acids are NOT DETECTED.  The SARS-CoV-2 RNA is generally detectable in upper respiratory specimens during the acute phase of infection. The lowest concentration of SARS-CoV-2 viral copies this assay can detect is 138 copies/mL. A negative result does not preclude SARS-Cov-2 infection and should not be used as the sole basis for treatment or other patient management decisions. A negative result may occur with  improper specimen collection/handling, submission of specimen other than nasopharyngeal swab, presence of viral mutation(s) within the areas targeted by this assay, and inadequate number of viral copies(<138 copies/mL). A negative result must be combined with clinical observations, patient history, and epidemiological information. The expected result is Negative.  Fact Sheet for Patients:  EntrepreneurPulse.com.au  Fact Sheet for Healthcare Providers:  IncredibleEmployment.be  This test is no t yet approved or cleared by the [place] FDA  and  has been authorized for detection and/or diagnosis of SARS-CoV-2 by FDA under an Emergency Use Authorization (EUA). This EUA will remain  in effect (meaning this test can be used) for the duration of the COVID-19 declaration under Section 564(b)(1) of the Act, 21 U.S.C.section 360bbb-3(b)(1), unless the authorization is terminated  or revoked sooner.       Influenza A by PCR NEGATIVE NEGATIVE Final   Influenza B by PCR NEGATIVE NEGATIVE Final    Comment: (NOTE) The Xpert Xpress SARS-CoV-2/FLU/RSV plus assay is intended as an aid in the diagnosis of influenza from Nasopharyngeal swab specimens and should not be used as a sole basis for treatment. Nasal washings and aspirates are unacceptable for Xpert Xpress SARS-CoV-2/FLU/RSV testing.  Fact Sheet for Patients: EntrepreneurPulse.com.au  Fact Sheet for Healthcare Providers: IncredibleEmployment.be  This test is not yet approved or cleared by the [place] FDA and has been authorized for detection and/or diagnosis of SARS-CoV-2 by FDA under an Emergency Use Authorization (EUA). This EUA will remain in effect (meaning this test can be used) for the duration of the COVID-19 declaration under Section 564(b)(1) of the Act, 21 U.S.C. section 360bbb-3(b)(1), unless the authorization is terminated or revoked.  Performed at [facility], [place] [address], [place], [place] [address]       Studies: DG Bone Survey Met  Result Date: 05/24/2021 CLINICAL DATA:  Multiple myeloma EXAM: METASTATIC BONE SURVEY COMPARISON:  CTs from May 21, 2021 FINDINGS: No discrete lucent lesions in the calvarium. Scattered tiny lucencies throughout the spine, pelvis and proximal femurs, better seen on prior CTs likely reflect disease involvement. Suspected lucent lesion in the midshaft of the right humerus, may also reflect involvement. No focal airspace consolidation. No pleural effusion or pneumothorax.  Nonobstructive bowel gas pattern. Aortic atherosclerosis. IMPRESSION: Scattered tiny lucencies throughout the spine, pelvis and proximal femurs, better seen on prior CTs, most likely reflecting disease involvement. Suspected lucent lesion in the midshaft of the right humerus, also may reflect myelomatous disease involvement. Electronically Signed   By: Dahlia Bailiff M.D.   On: 05/24/2021 12:29    Scheduled Meds:  famotidine  20 mg Oral Daily   nicotine  14 mg Transdermal Daily   nicotine  14 mg Transdermal Once  pantoprazole  40 mg Oral Daily    Continuous Infusions:  sodium chloride 100 mL/hr at 05/23/21 1829   cefTRIAXone (ROCEPHIN)  IV 2 g (05/24/21 1352)   metronidazole 500 mg (05/24/21 0936)   promethazine (PHENERGAN) injection (IM or IVPB)       LOS: 3 days     Alma Friendly, MD [facility]  If 7PM-7AM, please contact night-coverage [email or web address] 05/24/2021, 4:40 PM

## 2021-05-24 NOTE — Progress Notes (Signed)
[facility] ROUNDING NOTE   Subjective:   Interval History: 51 year old gentleman admitted with hypercalcemia and renal insufficiency.  Was admitted for IV saline and work-up for multiple myeloma.  Underwent r bone marrow biopsy 05/23/2020.  Appreciate assistance of Dr. Benay Spice.  Patient has received Decadron and medronate.  Blood pressure 115/76 pulse 86 temperature 98.5 O2 sats 97% urine output 2 L 05/23/2021  Sodium 136 potassium 3.5 chloride 108 CO2 17 BUN 52 creatinine 3.0 glucose 96 calcium 7.6 albumin 2.6 hemoglobin 11.2    Objective:  Vital signs in last 24 hours:  Temp:  [97.7 F (36.5 C)-98.5 F (36.9 C)] 98.5 F (36.9 C) (03/07 0505) Pulse Rate:  [57-86] 86 (03/07 0505) Resp:  [10-18] 18 (03/07 0505) BP: (99-134)/(57-80) 115/66 (03/07 0505) SpO2:  [93 %-98 %] 97 % (03/07 0505)  Weight change:  Filed Weights   05/21/21 1855  Weight: 77.1 kg    Intake/Output: I/O last 3 completed shifts: In: 4425.2 [P.O.:1540; I.V.:2485.2; IV Piggyback:400] Out: 4401 [Urine:3530]   Intake/Output this shift:  No intake/output data recorded.  CVS- RRR no murmurs rubs or gallops RS- CTA fine basal crackles ABD- BS present soft non-distended EXT- no edema   Basic Metabolic Panel: Recent Labs  Lab 05/21/21 1836 05/21/21 1923 05/22/21 0215 05/22/21 1015 05/23/21 0206 05/23/21 1012 05/24/21 0434  NA 135  --  135  --  137  --  136  K 3.8  --  4.1  --  4.1  --  3.5  CL 100  --  104  --  107  --  108  CO2 24  --  21*  --  17*  --  17*  GLUCOSE 119*  --  100*  --  155*  --  96  BUN 48*  --  48*  --  52*  --  52*  CREATININE 4.14*  --  3.92*  --  3.94*  --  3.00*  CALCIUM 11.9*  --  11.5*   11.5*   < > 9.3 8.8* 7.6*  MG  --  2.9* 2.9*  --   --   --   --   PHOS  --  5.3* 5.7*  --   --   --   --    < > = values in this interval not displayed.     Liver Function Tests: Recent Labs  Lab 05/21/21 1920 05/22/21 0215 05/23/21 0206 05/24/21 0434  AST 16 14*  15 18  ALT 20 18 20 18   ALKPHOS 68 59 60 48  BILITOT 0.4 0.4 0.5 0.4  PROT 5.8* 5.3* 5.5* 4.8*  ALBUMIN 2.9* 2.6* 2.8* 2.6*    Recent Labs  Lab 05/21/21 1920  LIPASE 24    No results for input(s): AMMONIA in the last 168 hours.  CBC: Recent Labs  Lab 05/21/21 1836 05/22/21 0215 05/23/21 0206 05/24/21 0434  WBC 12.9* 10.4 10.6* 4.8  NEUTROABS 6.7 4.0 7.1 4.3  HGB 12.6* 11.3* 12.2* 11.2*  HCT 36.1* 31.5* 35.3* 32.1*  MCV 92.8 92.6 93.4 92.0  PLT PLATELET CLUMPS NOTED ON SMEAR, UNABLE TO ESTIMATE 29* 31* 22*     Cardiac Enzymes: Recent Labs  Lab 05/21/21 1920  CKTOTAL 118     BNP: Invalid input(s): POCBNP  CBG: No results for input(s): GLUCAP in the last 168 hours.  Microbiology: Results for orders placed or performed during the hospital encounter of 05/21/21  Resp Panel by RT-PCR (Flu A&B, Covid) Nasopharyngeal Swab  Status: None   Collection Time: 05/21/21  9:23 PM   Specimen: Nasopharyngeal Swab; Nasopharyngeal(NP) swabs in vial transport medium  Result Value Ref Range Status   SARS Coronavirus 2 by RT PCR NEGATIVE NEGATIVE Final    Comment: (NOTE) SARS-CoV-2 target nucleic acids are NOT DETECTED.  The SARS-CoV-2 RNA is generally detectable in upper respiratory specimens during the acute phase of infection. The lowest concentration of SARS-CoV-2 viral copies this assay can detect is 138 copies/mL. A negative result does not preclude SARS-Cov-2 infection and should not be used as the sole basis for treatment or other patient management decisions. A negative result may occur with  improper specimen collection/handling, submission of specimen other than nasopharyngeal swab, presence of viral mutation(s) within the areas targeted by this assay, and inadequate number of viral copies(<138 copies/mL). A negative result must be combined with clinical observations, patient history, and epidemiological information. The expected result is Negative.  Fact  Sheet for Patients:  EntrepreneurPulse.com.au  Fact Sheet for Healthcare Providers:  IncredibleEmployment.be  This test is no t yet approved or cleared by the [place] FDA and  has been authorized for detection and/or diagnosis of SARS-CoV-2 by FDA under an Emergency Use Authorization (EUA). This EUA will remain  in effect (meaning this test can be used) for the duration of the COVID-19 declaration under Section 564(b)(1) of the Act, 21 U.S.C.section 360bbb-3(b)(1), unless the authorization is terminated  or revoked sooner.       Influenza A by PCR NEGATIVE NEGATIVE Final   Influenza B by PCR NEGATIVE NEGATIVE Final    Comment: (NOTE) The Xpert Xpress SARS-CoV-2/FLU/RSV plus assay is intended as an aid in the diagnosis of influenza from Nasopharyngeal swab specimens and should not be used as a sole basis for treatment. Nasal washings and aspirates are unacceptable for Xpert Xpress SARS-CoV-2/FLU/RSV testing.  Fact Sheet for Patients: EntrepreneurPulse.com.au  Fact Sheet for Healthcare Providers: IncredibleEmployment.be  This test is not yet approved or cleared by the [place] FDA and has been authorized for detection and/or diagnosis of SARS-CoV-2 by FDA under an Emergency Use Authorization (EUA). This EUA will remain in effect (meaning this test can be used) for the duration of the COVID-19 declaration under Section 564(b)(1) of the Act, 21 U.S.C. section 360bbb-3(b)(1), unless the authorization is terminated or revoked.  Performed at [facility], [place] [address] [place], [place] [address]     Coagulation Studies: Recent Labs    05/22/21 0215  LABPROT 14.4  INR 1.1     Urinalysis: Recent Labs    05/21/21 1850  COLORURINE YELLOW  LABSPEC 1.017  PHURINE 6.0  GLUCOSEU NEGATIVE  HGBUR MODERATE*  BILIRUBINUR NEGATIVE  KETONESUR NEGATIVE  PROTEINUR 30*  NITRITE NEGATIVE   LEUKOCYTESUR NEGATIVE       Imaging: CT BONE MARROW BIOPSY & ASPIRATION  Result Date: 05/23/2021 CLINICAL DATA:  Multiple myeloma evaluation EXAM: CT GUIDED DEEP ILIAC BONE ASPIRATION AND CORE BIOPSY TECHNIQUE: Patient was placed prone on the CT gantry and limited axial scans through the pelvis were obtained. This exam was performed according to the departmental dose-optimization program which includes automated exposure control, adjustment of the mA and/or kV according to patient size and/or use of iterative reconstruction technique. Appropriate skin entry site was identified. Skin site was marked, prepped with chlorhexidine, draped in usual sterile fashion, and infiltrated locally with 1% lidocaine. Intravenous Fentanyl 75mg and Versed 1.542mwere administered as conscious sedation during continuous monitoring of the patient's level of consciousness and  physiological / cardiorespiratory status by the radiology RN, with a total moderate sedation time of 10 minutes. Under CT fluoroscopic guidance an 11-gauge Cook trocar bone needle was advanced into the left iliac bone just lateral to the sacroiliac joint. Once needle tip position was confirmed, core and aspiration samples were obtained, submitted to pathology for approval. Patient tolerated procedure well. COMPLICATIONS: COMPLICATIONS none IMPRESSION: 1. Technically successful CT guided left iliac bone core and aspiration biopsy. Electronically Signed   By: Lucrezia Europe M.D.   On: 05/23/2021 16:13     Medications:    sodium chloride 100 mL/hr at 05/23/21 1829   cefTRIAXone (ROCEPHIN)  IV 2 g (05/23/21 1329)   metronidazole 500 mg (05/23/21 2152)   promethazine (PHENERGAN) injection (IM or IVPB)      famotidine  20 mg Oral Daily   nicotine  14 mg Transdermal Daily   acetaminophen **OR** acetaminophen, alum & mag hydroxide-simeth, fentaNYL, HYDROcodone-acetaminophen, midazolam, ondansetron (ZOFRAN) IV, promethazine (PHENERGAN) injection (IM or  IVPB)  Assessment/ Plan:  Renal insufficiency with unclear chronicity.  Suspected to be secondary to multiple myeloma.  Bone marrow biopsy per oncology.  Appreciate assistance from Dr. Benay Spice.  Creatinine continues to improve. Hypercalcemia.  Calcitonin and IV hydration.  Improvement noted.  We will continue to follow  Continue to avoid nephrotoxins.  No indication acute dialysis at this point.    LOS: [place] @TODAY @7 :11 AM

## 2021-05-24 NOTE — Progress Notes (Signed)
Per Dr. Benay Spice: Needs lab/OV/Velcade on 3/13 and lab/Velcade on 3/16. Scheduling message sent and orders for CBC/BMP placed per MD order. ?

## 2021-05-25 ENCOUNTER — Inpatient Hospital Stay (HOSPITAL_COMMUNITY): Payer: BLUE CROSS/BLUE SHIELD

## 2021-05-25 DIAGNOSIS — R1013 Epigastric pain: Secondary | ICD-10-CM

## 2021-05-25 DIAGNOSIS — N179 Acute kidney failure, unspecified: Secondary | ICD-10-CM | POA: Diagnosis not present

## 2021-05-25 DIAGNOSIS — D631 Anemia in chronic kidney disease: Secondary | ICD-10-CM | POA: Diagnosis not present

## 2021-05-25 DIAGNOSIS — C9000 Multiple myeloma not having achieved remission: Secondary | ICD-10-CM

## 2021-05-25 DIAGNOSIS — D696 Thrombocytopenia, unspecified: Secondary | ICD-10-CM | POA: Diagnosis not present

## 2021-05-25 LAB — CBC WITH DIFFERENTIAL/PLATELET
Abs Immature Granulocytes: 0.03 10*3/uL (ref 0.00–0.07)
Basophils Absolute: 0 10*3/uL (ref 0.0–0.1)
Basophils Relative: 0 %
Eosinophils Absolute: 0 10*3/uL (ref 0.0–0.5)
Eosinophils Relative: 0 %
HCT: 25.8 % — ABNORMAL LOW (ref 39.0–52.0)
Hemoglobin: 9.1 g/dL — ABNORMAL LOW (ref 13.0–17.0)
Immature Granulocytes: 1 %
Lymphocytes Relative: 22 %
Lymphs Abs: 0.7 10*3/uL (ref 0.7–4.0)
MCH: 32.4 pg (ref 26.0–34.0)
MCHC: 35.3 g/dL (ref 30.0–36.0)
MCV: 91.8 fL (ref 80.0–100.0)
Monocytes Absolute: 0.3 10*3/uL (ref 0.1–1.0)
Monocytes Relative: 8 %
Neutro Abs: 2.2 10*3/uL (ref 1.7–7.7)
Neutrophils Relative %: 69 %
Platelets: 12 10*3/uL — CL (ref 150–400)
RBC: 2.81 MIL/uL — ABNORMAL LOW (ref 4.22–5.81)
RDW: 15.4 % (ref 11.5–15.5)
WBC: 3.2 10*3/uL — ABNORMAL LOW (ref 4.0–10.5)
nRBC: 0 % (ref 0.0–0.2)

## 2021-05-25 LAB — COMPREHENSIVE METABOLIC PANEL
ALT: 16 U/L (ref 0–44)
AST: 16 U/L (ref 15–41)
Albumin: 2.2 g/dL — ABNORMAL LOW (ref 3.5–5.0)
Alkaline Phosphatase: 38 U/L (ref 38–126)
Anion gap: 9 (ref 5–15)
BUN: 29 mg/dL — ABNORMAL HIGH (ref 6–20)
CO2: 17 mmol/L — ABNORMAL LOW (ref 22–32)
Calcium: 6.6 mg/dL — ABNORMAL LOW (ref 8.9–10.3)
Chloride: 109 mmol/L (ref 98–111)
Creatinine, Ser: 2.56 mg/dL — ABNORMAL HIGH (ref 0.61–1.24)
GFR, Estimated: 30 mL/min — ABNORMAL LOW (ref >60)
Glucose, Bld: 97 mg/dL (ref 70–99)
Potassium: 3.3 mmol/L — ABNORMAL LOW (ref 3.5–5.1)
Sodium: 135 mmol/L (ref 135–145)
Total Bilirubin: 0.4 mg/dL (ref 0.3–1.2)
Total Protein: 4.3 g/dL — ABNORMAL LOW (ref 6.5–8.1)

## 2021-05-25 LAB — SURGICAL PATHOLOGY

## 2021-05-25 MED ORDER — POTASSIUM CHLORIDE CRYS ER 20 MEQ PO TBCR
40.0000 meq | EXTENDED_RELEASE_TABLET | Freq: Once | ORAL | Status: AC
  Administered 2021-05-25: 40 meq via ORAL
  Filled 2021-05-25: qty 2

## 2021-05-25 MED ORDER — CALCIUM CARBONATE 1250 (500 CA) MG PO TABS
1250.0000 mg | ORAL_TABLET | Freq: Three times a day (TID) | ORAL | Status: AC
Start: 2021-05-25 — End: 2021-05-25
  Administered 2021-05-25 (×3): 1250 mg via ORAL
  Filled 2021-05-25 (×3): qty 1

## 2021-05-25 NOTE — Assessment & Plan Note (Addendum)
-  protonix added with improvement ?-resolved ?

## 2021-05-25 NOTE — Assessment & Plan Note (Addendum)
Likely acute renal failure, but unclear chronicity ?Related to Multiple Myeloma ?Cr of 4.1, BUN 48 on admission ?-improving daily ?UA with trace proteinuria, moderate hemoglobinuria ?Nephrology consulted, no further work-up for now from a renal standpoint ?

## 2021-05-25 NOTE — Progress Notes (Signed)
?  Progress Note ? ? ?PatientJUWAUN King AXK:[id number] DOB: Aug 05, 1970 DOA: 05/21/2021     4 ?DOS: the patient was seen and examined on 05/25/2021 ?  ?Brief hospital course: ?51 year old male with a past medical history of asthma, tobacco abuse, presents to the ED with multiple complaints including 3-week history of pain around right sided upper chest, back, since falling in his kitchen at home.  Found to have AKI and new diagnosis of multiple myeloma.   ? ?Assessment and Plan: ?* AKI (acute kidney injury) ([place]) ?Likely acute renal failure, but unclear chronicity ?Related to Multiple Myeloma ?Cr of 4.1, BUN 48 on admission ?UA with trace proteinuria, moderate hemoglobinuria ?Nephrology consulted, no further work-up for now from a renal standpoint ? ?Multiple myeloma ([place]) ?Oncology on board, started Velcade/decadron for treatment of MM on 05/23/21 ?-plan for another treatment on 3/9 ? ?Thrombocytopenia ([place]) ? persistent severe thrombocytopenia, likely related to multiple myeloma involving the bone marrow.   ?-follow the platelet count closely and transfuse platelets as needed. ? ?Diverticulitis ?Noted on CT abdomen/pelvis ?Continue with IV antibiotics, Flagyl, ceftriaxone for a total of 5 days ? ?Hypercalcemia ?Per nephrology: Calcitonin and IV hydration.  Improvement noted.  We will continue to follow.  Noted some hypocalcemia slight overcorrection.  Will add calcium carbonate 2 tablets between meals x24 hours ? ?Epigastric pain ?-protonix added with improvement ?-RUQ done with no acute issues ? ?Tobacco abuse ?-encourage cessation ? ? ? ? ?Ambulate patient ? ? ?Subjective: no sign of bleeding ? ? ?Physical Exam: ?Vitals:  ? 05/24/21 1636 05/24/21 2118 05/25/21 0503 05/25/21 7867  ?BP: 116/73 110/63 101/66 96/64  ?Pulse: 78 74 72 77  ?Resp: $Remov'18 18 19 17  'QRKzwR$ ?Temp: 98.4 ?F (36.9 ?C) 98.6 ?F (37 ?C) 97.9 ?F (36.6 ?C) 98.7 ?F (37.1 ?C)  ?TempSrc:  Oral Oral Oral  ?SpO2: 95% 99% 97% 96%  ?Weight:      ?Height:       ? ? ?General: Appearance:     ?Overweight male in no acute distress  ?   ?Lungs:      respirations unlabored  ?Heart:    Normal heart rate. ?  ?MS:   All extremities are intact.  ?  ?Neurologic:   Awake, alert, oriented x 3. No apparent focal neurological           defect.   ?  ?Data Reviewed: ?Plts: 12-- trending down along with Hgb ? ? ?Family Communication: at bedside ? ?Disposition: ?Status is: Inpatient ?Remains inpatient appropriate because: needs improvement in labs ? Planned Discharge Destination: Home- Friday? ? ? ? ?Time spent: 45 minutes ? ?Author: ?Geradine Girt, DO ?05/25/2021 9:47 AM ? ?For on call review [email or web address].  ?

## 2021-05-25 NOTE — Hospital Course (Signed)
51 year old male with a past medical history of asthma, tobacco abuse, presents to the ED with multiple complaints including 3-week history of pain around right sided upper chest, back, since falling in his kitchen at home.  Found to have AKI and new diagnosis of multiple myeloma.   ?

## 2021-05-25 NOTE — Progress Notes (Signed)
?   05/25/21 0700  ?Notify: Provider  ?Provider Name/Title Chen,MD  ?Date Provider Notified 05/25/21  ?Time Provider Notified 0630  ?Notification Type Page ?(secure chat)  ?Notification Reason Critical result ?(Platelet=12)  ?Provider response No new orders  ?Date of Provider Response 05/25/21  ?Time of Provider Response [phone]  ? ? ?

## 2021-05-25 NOTE — Progress Notes (Signed)
IV saline locked for patient to ambulate on unit with family member. OK for saline lock while ambulating per Dr. Eliseo Squires. Patient instructed to remain on unit while ambulating, verbalized understanding. ?

## 2021-05-25 NOTE — Assessment & Plan Note (Signed)
encourage cessation 

## 2021-05-25 NOTE — Progress Notes (Signed)
IP PROGRESS NOTE ? ?Subjective:  ? ?Jordan King reports improvement in nausea and the subxiphoid discomfort.  He had a bowel movement.  No bleeding. ?Objective: ?Vital signs in last 24 hours: ?Blood pressure 96/64, pulse 77, temperature 98.7 ?F (37.1 ?C), temperature source Oral, resp. rate 17, height 5' 8"  (1.727 m), weight 170 lb (77.1 kg), SpO2 96 %. ? ?Intake/Output from previous day: ?03/07 0701 - 03/08 0700 ?In: 2634.5 [P.O.:700; I.V.:1634.7; IV Piggyback:299.8] ?Out: 780 [Urine:780] ? ?Physical Exam: ? ?HEENT: No thrush or bleeding ?Cardiac: Regular rate and rhythm ?Abdomen: Soft, no hepatosplenomegaly, nontender ?Extremities: No leg edema ?Skin: No ecchymoses or petechiae, left iliac bone marrow site without bleeding ? ?Lab Results: ?Recent Labs  ?  05/24/21 ?0102 05/25/21 ?7253  ?WBC 4.8 3.2*  ?HGB 11.2* 9.1*  ?HCT 32.1* 25.8*  ?PLT 22* 12*  ? ? ?BMET ?Recent Labs  ?  05/24/21 ?0434 05/25/21 ?6644  ?NA 136 135  ?K 3.5 3.3*  ?CL 108 109  ?CO2 17* 17*  ?GLUCOSE 96 97  ?BUN 52* 29*  ?CREATININE 3.00* 2.56*  ?CALCIUM 7.6* 6.6*  ? ? ?No results found for: CEA1, CEA, [id number], CA125 ? ?Studies/Results: ?DG Bone Survey Met ? ?Result Date: 05/24/2021 ?CLINICAL DATA:  Multiple myeloma EXAM: METASTATIC BONE SURVEY COMPARISON:  CTs from May 21, 2021 FINDINGS: No discrete lucent lesions in the calvarium. Scattered tiny lucencies throughout the spine, pelvis and proximal femurs, better seen on prior CTs likely reflect disease involvement. Suspected lucent lesion in the midshaft of the right humerus, may also reflect involvement. No focal airspace consolidation. No pleural effusion or pneumothorax. Nonobstructive bowel gas pattern. Aortic atherosclerosis. IMPRESSION: Scattered tiny lucencies throughout the spine, pelvis and proximal femurs, better seen on prior CTs, most likely reflecting disease involvement. Suspected lucent lesion in the midshaft of the right humerus, also may reflect myelomatous disease involvement.  Electronically Signed   By: Dahlia Bailiff M.D.   On: 05/24/2021 12:29  ? ?US Abdomen Limited RUQ (LIVER/GB) ? ?Result Date: 05/25/2021 ?CLINICAL DATA:  Epigastric pain. EXAM: ULTRASOUND ABDOMEN LIMITED RIGHT UPPER QUADRANT COMPARISON:  None. FINDINGS: Gallbladder: A mild amount of heterogeneous, echogenic sludge is seen within the gallbladder lumen. No gallstones are identified. The gallbladder wall is diffusely echogenic and measures 3.2 mm in thickness. No sonographic Murphy sign noted by sonographer. Common bile duct: Diameter: 3.6 mm Liver: No focal lesion identified. Mild, diffusely increased echogenicity of the liver parenchyma is noted. Portal vein is patent on color Doppler imaging with normal direction of blood flow towards the liver. Other: None. IMPRESSION: 1. Mild amount of gallbladder sludge with findings suggestive of calcification within the gallbladder wall. 2. Mild hepatic steatosis without focal liver lesions. Electronically Signed   By: Virgina Norfolk M.D.   On: 05/25/2021 01:50   ? ?Medications: I have reviewed the patient's current medications. ? ?Assessment/Plan: ?Multiple myeloma ?-05/22/2021-kappa free light chain 11.3, lambda free light chain 17,028 ?-25 g proteinuria, 20 g lambda light chains ?-Bone marrow biopsy 05/23/2021-hypercellular marrow involved by plasma cell myeloma, 80-90% plasma cells on the bone marrow biopsy, diminished iron stains ?-Cycle 1 Velcade/Decadron 05/23/2021 (Decadron given 05/22/2021) ?Renal failure secondary to #1-improved ?Hypercalcemia-status post treat with calcitonin, pamidronate 05/22/2021-resolved ?Lytic bone lesions ?Anemia ?Thrombocytopenia ? ?Jordan King appears unchanged.  The bone marrow biopsy confirmed a diagnosis of multiple myeloma.  I discussed the diagnosis and treatment plan with Jordan King again today.  He will complete another treatment with Velcade/Decadron tomorrow. ?He has severe thrombocytopenia secondary to multiple myeloma  involving the bone marrow.   We will transfuse platelets for bleeding or a count of less than 10,000.  Hopefully the platelet count will improve over the next few weeks. ? ? ?Recommendations: ?1.  Day4 Velcade/Decadron 05/26/2021 ?2.  Daily CBC, transfuse platelets for a count of less than 10,000 or bleeding ?3.  Outpatient follow-up at the Cancer center ? ?Betsy Coder, MD  ?05/25/2021, 11:18 AM ?  ?

## 2021-05-25 NOTE — Assessment & Plan Note (Addendum)
persistent severe thrombocytopenia, likely related to multiple myeloma involving the bone marrow.   ?-trending up ?

## 2021-05-25 NOTE — Assessment & Plan Note (Addendum)
Noted on CT abdomen/pelvis ?Treated with:  Flagyl, ceftriaxone for a total of 5 days ?

## 2021-05-25 NOTE — Plan of Care (Signed)
  Problem: Activity: Goal: Risk for activity intolerance will decrease Outcome: Progressing   

## 2021-05-25 NOTE — Assessment & Plan Note (Addendum)
Oncology on board, started Velcade/decadron for treatment of MM on 05/23/21 ?- another treatment on 3/9 ? ?

## 2021-05-25 NOTE — Assessment & Plan Note (Addendum)
Now hypocalcemia ? ?Per nephrology: Calcitonin and IV hydration.  Noted some hypocalcemia slight overcorrection.   ?-repleted IV and PO ?- to be seen Monday for labs ?

## 2021-05-25 NOTE — Progress Notes (Signed)
[facility] ROUNDING NOTE   Subjective:   Interval History: 51 year old gentleman admitted with hypercalcemia and renal insufficiency.  Was admitted for IV saline and work-up for multiple myeloma.  Underwent r bone marrow biopsy 05/23/2020.  Appreciate assistance of Dr. Benay Spice.  Patient has received Decadron and medronate.  Blood pressure 101/66 pulse 72 temperature 97.9  Sodium 135 potassium 3.3 chloride 109 CO2 17 BUN 29 creatinine 2.5 glucose 97 calcium 6.6 albumin 2.2.    Objective:  Vital signs in last 24 hours:  Temp:  [97.9 F (36.6 C)-98.6 F (37 C)] 97.9 F (36.6 C) (03/08 0503) Pulse Rate:  [72-94] 72 (03/08 0503) Resp:  [17-19] 19 (03/08 0503) BP: (101-138)/(63-74) 101/66 (03/08 0503) SpO2:  [95 %-99 %] 97 % (03/08 0503)  Weight change:  Filed Weights   05/21/21 1855  Weight: 77.1 kg    Intake/Output: I/O last 3 completed shifts: In: 5681 [P.O.:1060; I.V.:2676.2; IV Piggyback:399.8] Out: 1710 [Urine:1710]   Intake/Output this shift:  No intake/output data recorded.  CVS- RRR no murmurs rubs or gallops RS- CTA fine basal crackles ABD- BS present soft non-distended EXT- no edema   Basic Metabolic Panel: Recent Labs  Lab 05/21/21 1836 05/21/21 1923 05/22/21 0215 05/22/21 1015 05/23/21 0206 05/23/21 1012 05/24/21 0434 05/25/21 0344  NA 135  --  135  --  137  --  136 135  K 3.8  --  4.1  --  4.1  --  3.5 3.3*  CL 100  --  104  --  107  --  108 109  CO2 24  --  21*  --  17*  --  17* 17*  GLUCOSE 119*  --  100*  --  155*  --  96 97  BUN 48*  --  48*  --  52*  --  52* 29*  CREATININE 4.14*  --  3.92*  --  3.94*  --  3.00* 2.56*  CALCIUM 11.9*  --  11.5*   11.5*   < > 9.3 8.8* 7.6* 6.6*  MG  --  2.9* 2.9*  --   --   --   --   --   PHOS  --  5.3* 5.7*  --   --   --   --   --    < > = values in this interval not displayed.     Liver Function Tests: Recent Labs  Lab 05/21/21 1920 05/22/21 0215 05/23/21 0206 05/24/21 0434  05/25/21 0344  AST 16 14* 15 18 16   ALT 20 18 20 18 16   ALKPHOS 68 59 60 48 38  BILITOT 0.4 0.4 0.5 0.4 0.4  PROT 5.8* 5.3* 5.5* 4.8* 4.3*  ALBUMIN 2.9* 2.6* 2.8* 2.6* 2.2*    Recent Labs  Lab 05/21/21 1920  LIPASE 24    No results for input(s): AMMONIA in the last 168 hours.  CBC: Recent Labs  Lab 05/21/21 1836 05/22/21 0215 05/23/21 0206 05/24/21 0434 05/25/21 0344  WBC 12.9* 10.4 10.6* 4.8 3.2*  NEUTROABS 6.7 4.0 7.1 4.3 2.2  HGB 12.6* 11.3* 12.2* 11.2* 9.1*  HCT 36.1* 31.5* 35.3* 32.1* 25.8*  MCV 92.8 92.6 93.4 92.0 91.8  PLT PLATELET CLUMPS NOTED ON SMEAR, UNABLE TO ESTIMATE 29* 31* 22* 12*     Cardiac Enzymes: Recent Labs  Lab 05/21/21 1920  CKTOTAL 118     BNP: Invalid input(s): POCBNP  CBG: No results for input(s): GLUCAP in the last 168 hours.  Microbiology: Results for orders placed  or performed during the hospital encounter of 05/21/21  Resp Panel by RT-PCR (Flu A&B, Covid) Nasopharyngeal Swab     Status: None   Collection Time: 05/21/21  9:23 PM   Specimen: Nasopharyngeal Swab; Nasopharyngeal(NP) swabs in vial transport medium  Result Value Ref Range Status   SARS Coronavirus 2 by RT PCR NEGATIVE NEGATIVE Final    Comment: (NOTE) SARS-CoV-2 target nucleic acids are NOT DETECTED.  The SARS-CoV-2 RNA is generally detectable in upper respiratory specimens during the acute phase of infection. The lowest concentration of SARS-CoV-2 viral copies this assay can detect is 138 copies/mL. A negative result does not preclude SARS-Cov-2 infection and should not be used as the sole basis for treatment or other patient management decisions. A negative result may occur with  improper specimen collection/handling, submission of specimen other than nasopharyngeal swab, presence of viral mutation(s) within the areas targeted by this assay, and inadequate number of viral copies(<138 copies/mL). A negative result must be combined with clinical  observations, patient history, and epidemiological information. The expected result is Negative.  Fact Sheet for Patients:  EntrepreneurPulse.com.au  Fact Sheet for Healthcare Providers:  IncredibleEmployment.be  This test is no t yet approved or cleared by the [place] FDA and  has been authorized for detection and/or diagnosis of SARS-CoV-2 by FDA under an Emergency Use Authorization (EUA). This EUA will remain  in effect (meaning this test can be used) for the duration of the COVID-19 declaration under Section 564(b)(1) of the Act, 21 U.S.C.section 360bbb-3(b)(1), unless the authorization is terminated  or revoked sooner.       Influenza A by PCR NEGATIVE NEGATIVE Final   Influenza B by PCR NEGATIVE NEGATIVE Final    Comment: (NOTE) The Xpert Xpress SARS-CoV-2/FLU/RSV plus assay is intended as an aid in the diagnosis of influenza from Nasopharyngeal swab specimens and should not be used as a sole basis for treatment. Nasal washings and aspirates are unacceptable for Xpert Xpress SARS-CoV-2/FLU/RSV testing.  Fact Sheet for Patients: EntrepreneurPulse.com.au  Fact Sheet for Healthcare Providers: IncredibleEmployment.be  This test is not yet approved or cleared by the [place] FDA and has been authorized for detection and/or diagnosis of SARS-CoV-2 by FDA under an Emergency Use Authorization (EUA). This EUA will remain in effect (meaning this test can be used) for the duration of the COVID-19 declaration under Section 564(b)(1) of the Act, 21 U.S.C. section 360bbb-3(b)(1), unless the authorization is terminated or revoked.  Performed at [facility], [place] [address] [place], [place] [address]     Coagulation Studies: No results for input(s): LABPROT, INR in the last 72 hours.   Urinalysis: No results for input(s): COLORURINE, LABSPEC, PHURINE, GLUCOSEU, HGBUR, BILIRUBINUR,  KETONESUR, PROTEINUR, UROBILINOGEN, NITRITE, LEUKOCYTESUR in the last 72 hours.  Invalid input(s): APPERANCEUR     Imaging: DG Bone Survey Met  Result Date: 05/24/2021 CLINICAL DATA:  Multiple myeloma EXAM: METASTATIC BONE SURVEY COMPARISON:  CTs from May 21, 2021 FINDINGS: No discrete lucent lesions in the calvarium. Scattered tiny lucencies throughout the spine, pelvis and proximal femurs, better seen on prior CTs likely reflect disease involvement. Suspected lucent lesion in the midshaft of the right humerus, may also reflect involvement. No focal airspace consolidation. No pleural effusion or pneumothorax. Nonobstructive bowel gas pattern. Aortic atherosclerosis. IMPRESSION: Scattered tiny lucencies throughout the spine, pelvis and proximal femurs, better seen on prior CTs, most likely reflecting disease involvement. Suspected lucent lesion in the midshaft of the right humerus, also may reflect myelomatous disease involvement.  Electronically Signed   By: Dahlia Bailiff M.D.   On: 05/24/2021 12:29   CT BONE MARROW BIOPSY & ASPIRATION  Result Date: 05/23/2021 CLINICAL DATA:  Multiple myeloma evaluation EXAM: CT GUIDED DEEP ILIAC BONE ASPIRATION AND CORE BIOPSY TECHNIQUE: Patient was placed prone on the CT gantry and limited axial scans through the pelvis were obtained. This exam was performed according to the departmental dose-optimization program which includes automated exposure control, adjustment of the mA and/or kV according to patient size and/or use of iterative reconstruction technique. Appropriate skin entry site was identified. Skin site was marked, prepped with chlorhexidine, draped in usual sterile fashion, and infiltrated locally with 1% lidocaine. Intravenous Fentanyl 89mg and Versed 1.515mwere administered as conscious sedation during continuous monitoring of the patient's level of consciousness and physiological / cardiorespiratory status by the radiology RN, with a total moderate  sedation time of 10 minutes. Under CT fluoroscopic guidance an 11-gauge Cook trocar bone needle was advanced into the left iliac bone just lateral to the sacroiliac joint. Once needle tip position was confirmed, core and aspiration samples were obtained, submitted to pathology for approval. Patient tolerated procedure well. COMPLICATIONS: COMPLICATIONS none IMPRESSION: 1. Technically successful CT guided left iliac bone core and aspiration biopsy. Electronically Signed   By: D Lucrezia Europe.D.   On: 05/23/2021 16:13   USKoreabdomen Limited RUQ (LIVER/GB)  Result Date: 05/25/2021 CLINICAL DATA:  Epigastric pain. EXAM: ULTRASOUND ABDOMEN LIMITED RIGHT UPPER QUADRANT COMPARISON:  None. FINDINGS: Gallbladder: A mild amount of heterogeneous, echogenic sludge is seen within the gallbladder lumen. No gallstones are identified. The gallbladder wall is diffusely echogenic and measures 3.2 mm in thickness. No sonographic Murphy sign noted by sonographer. Common bile duct: Diameter: 3.6 mm Liver: No focal lesion identified. Mild, diffusely increased echogenicity of the liver parenchyma is noted. Portal vein is patent on color Doppler imaging with normal direction of blood flow towards the liver. Other: None. IMPRESSION: 1. Mild amount of gallbladder sludge with findings suggestive of calcification within the gallbladder wall. 2. Mild hepatic steatosis without focal liver lesions. Electronically Signed   By: ThVirgina Norfolk.D.   On: 05/25/2021 01:50     Medications:    sodium chloride 100 mL/hr at 05/25/21 0411   cefTRIAXone (ROCEPHIN)  IV Stopped (05/24/21 2153)   metronidazole Stopped (05/24/21 2330)   promethazine (PHENERGAN) injection (IM or IVPB)      nicotine  14 mg Transdermal Daily   nicotine  14 mg Transdermal Once   pantoprazole  40 mg Oral Daily   acetaminophen **OR** acetaminophen, alum & mag hydroxide-simeth, fentaNYL, HYDROcodone-acetaminophen, midazolam, ondansetron (ZOFRAN) IV, polyethylene  glycol, promethazine (PHENERGAN) injection (IM or IVPB)  Assessment/ Plan:  Renal insufficiency with unclear chronicity.  Suspected to be secondary to multiple myeloma.  Bone marrow biopsy per oncology.  Appreciate assistance from Dr. ShBenay Spice Creatinine continues to improve. Hypercalcemia.  Calcitonin and IV hydration.  Improvement noted.  We will continue to follow.  Noted some hypocalcemia slight overcorrection.  Will add calcium carbonate 2 tablets between meals x24 hours  Continue to avoid nephrotoxins.  No indication acute dialysis at this point.    LOS: 4 Parchmen@TODAY @7 :36 AM

## 2021-05-26 DIAGNOSIS — D696 Thrombocytopenia, unspecified: Secondary | ICD-10-CM | POA: Diagnosis not present

## 2021-05-26 DIAGNOSIS — C9000 Multiple myeloma not having achieved remission: Secondary | ICD-10-CM | POA: Diagnosis not present

## 2021-05-26 DIAGNOSIS — D631 Anemia in chronic kidney disease: Secondary | ICD-10-CM | POA: Diagnosis not present

## 2021-05-26 DIAGNOSIS — N179 Acute kidney failure, unspecified: Secondary | ICD-10-CM | POA: Diagnosis not present

## 2021-05-26 LAB — COMPREHENSIVE METABOLIC PANEL
ALT: 15 U/L (ref 0–44)
AST: 14 U/L — ABNORMAL LOW (ref 15–41)
Albumin: 2.1 g/dL — ABNORMAL LOW (ref 3.5–5.0)
Alkaline Phosphatase: 41 U/L (ref 38–126)
Anion gap: 9 (ref 5–15)
BUN: 18 mg/dL (ref 6–20)
CO2: 18 mmol/L — ABNORMAL LOW (ref 22–32)
Calcium: 6.2 mg/dL — CL (ref 8.9–10.3)
Chloride: 109 mmol/L (ref 98–111)
Creatinine, Ser: 2.36 mg/dL — ABNORMAL HIGH (ref 0.61–1.24)
GFR, Estimated: 33 mL/min — ABNORMAL LOW (ref >60)
Glucose, Bld: 87 mg/dL (ref 70–99)
Potassium: 3.4 mmol/L — ABNORMAL LOW (ref 3.5–5.1)
Sodium: 136 mmol/L (ref 135–145)
Total Bilirubin: 0.4 mg/dL (ref 0.3–1.2)
Total Protein: 4.3 g/dL — ABNORMAL LOW (ref 6.5–8.1)

## 2021-05-26 LAB — CBC WITH DIFFERENTIAL/PLATELET
Abs Immature Granulocytes: 0.05 10*3/uL (ref 0.00–0.07)
Basophils Absolute: 0 10*3/uL (ref 0.0–0.1)
Basophils Relative: 0 %
Eosinophils Absolute: 0.1 10*3/uL (ref 0.0–0.5)
Eosinophils Relative: 1 %
HCT: 25.2 % — ABNORMAL LOW (ref 39.0–52.0)
Hemoglobin: 9 g/dL — ABNORMAL LOW (ref 13.0–17.0)
Immature Granulocytes: 1 %
Lymphocytes Relative: 16 %
Lymphs Abs: 0.9 10*3/uL (ref 0.7–4.0)
MCH: 32.5 pg (ref 26.0–34.0)
MCHC: 35.7 g/dL (ref 30.0–36.0)
MCV: 91 fL (ref 80.0–100.0)
Monocytes Absolute: 0.6 10*3/uL (ref 0.1–1.0)
Monocytes Relative: 11 %
Neutro Abs: 3.9 10*3/uL (ref 1.7–7.7)
Neutrophils Relative %: 71 %
Platelets: 11 10*3/uL — CL (ref 150–400)
RBC: 2.77 MIL/uL — ABNORMAL LOW (ref 4.22–5.81)
RDW: 15.6 % — ABNORMAL HIGH (ref 11.5–15.5)
WBC: 5.6 10*3/uL (ref 4.0–10.5)
nRBC: 0 % (ref 0.0–0.2)

## 2021-05-26 LAB — IMMUNOFIXATION ELECTROPHORESIS
IgA: 35 mg/dL — ABNORMAL LOW (ref 90–386)
IgG (Immunoglobin G), Serum: 309 mg/dL — ABNORMAL LOW (ref 603–1613)
IgM (Immunoglobulin M), Srm: 9 mg/dL — ABNORMAL LOW (ref 20–172)
Total Protein ELP: 5.2 g/dL — ABNORMAL LOW (ref 6.0–8.5)

## 2021-05-26 LAB — RENAL FUNCTION PANEL
Albumin: 2.1 g/dL — ABNORMAL LOW (ref 3.5–5.0)
Anion gap: 8 (ref 5–15)
BUN: 18 mg/dL (ref 6–20)
CO2: 18 mmol/L — ABNORMAL LOW (ref 22–32)
Calcium: 6.2 mg/dL — CL (ref 8.9–10.3)
Chloride: 110 mmol/L (ref 98–111)
Creatinine, Ser: 2.31 mg/dL — ABNORMAL HIGH (ref 0.61–1.24)
GFR, Estimated: 34 mL/min — ABNORMAL LOW (ref >60)
Glucose, Bld: 86 mg/dL (ref 70–99)
Phosphorus: 1.3 mg/dL — ABNORMAL LOW (ref 2.5–4.6)
Potassium: 3.5 mmol/L (ref 3.5–5.1)
Sodium: 136 mmol/L (ref 135–145)

## 2021-05-26 LAB — SAVE SMEAR(SSMR), FOR PROVIDER SLIDE REVIEW

## 2021-05-26 MED ORDER — FLUTICASONE PROPIONATE 50 MCG/ACT NA SUSP: 2.0000 | Freq: Every day | NASAL | Status: DC

## 2021-05-26 MED ORDER — CALCIUM GLUCONATE-NACL 1-0.675 GM/50ML-% IV SOLN
1.0000 g | Freq: Once | INTRAVENOUS | Status: AC
Start: 2021-05-26 — End: 2021-05-26
  Administered 2021-05-26: 1000 mg via INTRAVENOUS
  Filled 2021-05-26: qty 50

## 2021-05-26 MED ORDER — PROCHLORPERAZINE MALEATE 10 MG PO TABS
10.0000 mg | ORAL_TABLET | Freq: Once | ORAL | Status: AC
  Administered 2021-05-26: 10 mg via ORAL
  Filled 2021-05-26: qty 1

## 2021-05-26 MED ORDER — BORTEZOMIB CHEMO SQ INJECTION 3.5 MG (2.5MG/ML)
1.0000 mg/m2 | Freq: Once | INTRAMUSCULAR | Status: AC
  Administered 2021-05-26: 2 mg via SUBCUTANEOUS
  Filled 2021-05-26: qty 0.8

## 2021-05-26 MED ORDER — SODIUM PHOSPHATES 45 MMOLE/15ML IV SOLN
30.0000 mmol | Freq: Once | INTRAVENOUS | Status: AC
  Administered 2021-05-26: 30 mmol via INTRAVENOUS
  Filled 2021-05-26: qty 10

## 2021-05-26 MED ORDER — CALCIUM CARBONATE 1250 (500 CA) MG PO TABS
1250.0000 mg | ORAL_TABLET | Freq: Four times a day (QID) | ORAL | Status: AC
  Administered 2021-05-26 (×4): 1250 mg via ORAL
  Filled 2021-05-26 (×4): qty 1

## 2021-05-26 MED ORDER — DEXAMETHASONE 6 MG PO TABS
40.0000 mg | ORAL_TABLET | Freq: Once | ORAL | Status: AC
  Administered 2021-05-26: 40 mg via ORAL
  Filled 2021-05-26: qty 1

## 2021-05-26 MED ORDER — RISAQUAD PO CAPS
1.0000 | ORAL_CAPSULE | Freq: Every day | ORAL | Status: DC
  Administered 2021-05-26 – 2021-05-27 (×2): 1 via ORAL
  Filled 2021-05-26 (×2): qty 1

## 2021-05-26 MED ORDER — LORATADINE 10 MG PO TABS
10.0000 mg | ORAL_TABLET | Freq: Every day | ORAL | Status: DC
  Administered 2021-05-27: 10 mg via ORAL
  Filled 2021-05-26: qty 1

## 2021-05-26 NOTE — Progress Notes (Signed)
Patient received velcade injection today. He tolerated it well with no issues.  ?

## 2021-05-26 NOTE — Progress Notes (Signed)
Ok to treat with Velcade today with plts = 11,000 per Dr Benay Spice ?

## 2021-05-26 NOTE — Progress Notes (Signed)
[facility] ROUNDING NOTE   Subjective:   Interval History: 51 year old gentleman admitted with hypercalcemia and renal insufficiency.  Was admitted for IV saline and work-up for multiple myeloma.  Underwent r bone marrow biopsy 05/23/2020.  Appreciate assistance of Dr. Benay Spice.  Patient has received Decadron and medronate.  Blood pressure 106/65 pulse 78 temperature 98.9 O2 sats 97% room air  Sodium 136 potassium 3.5 chloride 110 CO2 18 BUN 18 creatinine 2.36 glucose 18 calcium 6.2 albumin 2.1 phosphorus 1.3.  Hemoglobin 9.1    Objective:  Vital signs in last 24 hours:  Temp:  [98.7 F (37.1 C)-100.1 F (37.8 C)] 98.9 F (37.2 C) (03/09 0539) Pulse Rate:  [77-87] 78 (03/09 0539) Resp:  [17-18] 17 (03/09 0539) BP: (96-157)/(64-141) 106/65 (03/09 0539) SpO2:  [96 %-100 %] 97 % (03/09 0539)  Weight change:  Filed Weights   05/21/21 1855  Weight: 77.1 kg    Intake/Output: I/O last 3 completed shifts: In: 2872.8 [P.O.:580; I.V.:1742.2; Other:75; IV Piggyback:475.6] Out: 380 [Urine:380]   Intake/Output this shift:  No intake/output data recorded.  CVS- RRR no murmurs rubs or gallops RS- CTA fine basal crackles ABD- BS present soft non-distended EXT- no edema   Basic Metabolic Panel: Recent Labs  Lab 05/21/21 1923 05/22/21 0215 05/22/21 1015 05/23/21 0206 05/23/21 1012 05/24/21 0434 05/25/21 0344 05/26/21 0608  NA  --  135  --  137  --  136 135 136   136  K  --  4.1  --  4.1  --  3.5 3.3* 3.4*   3.5  CL  --  104  --  107  --  108 109 109   110  CO2  --  21*  --  17*  --  17* 17* 18*   18*  GLUCOSE  --  100*  --  155*  --  96 97 87   86  BUN  --  48*  --  52*  --  52* 29* 18   18  CREATININE  --  3.92*  --  3.94*  --  3.00* 2.56* 2.36*   2.31*  CALCIUM  --  11.5*   11.5*   < > 9.3   < > 7.6* 6.6* 6.2*   6.2*  MG 2.9* 2.9*  --   --   --   --   --   --   PHOS 5.3* 5.7*  --   --   --   --   --  1.3*   < > = values in this interval not displayed.      Liver Function Tests: Recent Labs  Lab 05/22/21 0215 05/23/21 0206 05/24/21 0434 05/25/21 0344 05/26/21 0608  AST 14* 15 18 16  14*  ALT 18 20 18 16 15   ALKPHOS 59 60 48 38 41  BILITOT 0.4 0.5 0.4 0.4 0.4  PROT 5.3* 5.5* 4.8* 4.3* 4.3*  ALBUMIN 2.6* 2.8* 2.6* 2.2* 2.1*   2.1*    Recent Labs  Lab 05/21/21 1920  LIPASE 24    No results for input(s): AMMONIA in the last 168 hours.  CBC: Recent Labs  Lab 05/21/21 1836 05/22/21 0215 05/23/21 0206 05/24/21 0434 05/25/21 0344  WBC 12.9* 10.4 10.6* 4.8 3.2*  NEUTROABS 6.7 4.0 7.1 4.3 2.2  HGB 12.6* 11.3* 12.2* 11.2* 9.1*  HCT 36.1* 31.5* 35.3* 32.1* 25.8*  MCV 92.8 92.6 93.4 92.0 91.8  PLT PLATELET CLUMPS NOTED ON SMEAR, UNABLE TO ESTIMATE 29* 31* 22* 12*  Cardiac Enzymes: Recent Labs  Lab 05/21/21 1920  CKTOTAL 118     BNP: Invalid input(s): POCBNP  CBG: No results for input(s): GLUCAP in the last 168 hours.  Microbiology: Results for orders placed or performed during the hospital encounter of 05/21/21  Resp Panel by RT-PCR (Flu A&B, Covid) Nasopharyngeal Swab     Status: None   Collection Time: 05/21/21  9:23 PM   Specimen: Nasopharyngeal Swab; Nasopharyngeal(NP) swabs in vial transport medium  Result Value Ref Range Status   SARS Coronavirus 2 by RT PCR NEGATIVE NEGATIVE Final    Comment: (NOTE) SARS-CoV-2 target nucleic acids are NOT DETECTED.  The SARS-CoV-2 RNA is generally detectable in upper respiratory specimens during the acute phase of infection. The lowest concentration of SARS-CoV-2 viral copies this assay can detect is 138 copies/mL. A negative result does not preclude SARS-Cov-2 infection and should not be used as the sole basis for treatment or other patient management decisions. A negative result may occur with  improper specimen collection/handling, submission of specimen other than nasopharyngeal swab, presence of viral mutation(s) within the areas targeted by this assay,  and inadequate number of viral copies(<138 copies/mL). A negative result must be combined with clinical observations, patient history, and epidemiological information. The expected result is Negative.  Fact Sheet for Patients:  EntrepreneurPulse.com.au  Fact Sheet for Healthcare Providers:  IncredibleEmployment.be  This test is no t yet approved or cleared by the [place] FDA and  has been authorized for detection and/or diagnosis of SARS-CoV-2 by FDA under an Emergency Use Authorization (EUA). This EUA will remain  in effect (meaning this test can be used) for the duration of the COVID-19 declaration under Section 564(b)(1) of the Act, 21 U.S.C.section 360bbb-3(b)(1), unless the authorization is terminated  or revoked sooner.       Influenza A by PCR NEGATIVE NEGATIVE Final   Influenza B by PCR NEGATIVE NEGATIVE Final    Comment: (NOTE) The Xpert Xpress SARS-CoV-2/FLU/RSV plus assay is intended as an aid in the diagnosis of influenza from Nasopharyngeal swab specimens and should not be used as a sole basis for treatment. Nasal washings and aspirates are unacceptable for Xpert Xpress SARS-CoV-2/FLU/RSV testing.  Fact Sheet for Patients: EntrepreneurPulse.com.au  Fact Sheet for Healthcare Providers: IncredibleEmployment.be  This test is not yet approved or cleared by the [place] FDA and has been authorized for detection and/or diagnosis of SARS-CoV-2 by FDA under an Emergency Use Authorization (EUA). This EUA will remain in effect (meaning this test can be used) for the duration of the COVID-19 declaration under Section 564(b)(1) of the Act, 21 U.S.C. section 360bbb-3(b)(1), unless the authorization is terminated or revoked.  Performed at [facility], [place] [address], [place], [place] [address]     Coagulation Studies: No results for input(s): LABPROT, INR in the last 72  hours.   Urinalysis: No results for input(s): COLORURINE, LABSPEC, PHURINE, GLUCOSEU, HGBUR, BILIRUBINUR, KETONESUR, PROTEINUR, UROBILINOGEN, NITRITE, LEUKOCYTESUR in the last 72 hours.  Invalid input(s): APPERANCEUR     Imaging: DG Bone Survey Met  Result Date: 05/24/2021 CLINICAL DATA:  Multiple myeloma EXAM: METASTATIC BONE SURVEY COMPARISON:  CTs from May 21, 2021 FINDINGS: No discrete lucent lesions in the calvarium. Scattered tiny lucencies throughout the spine, pelvis and proximal femurs, better seen on prior CTs likely reflect disease involvement. Suspected lucent lesion in the midshaft of the right humerus, may also reflect involvement. No focal airspace consolidation. No pleural effusion or pneumothorax. Nonobstructive bowel gas pattern. Aortic atherosclerosis.  IMPRESSION: Scattered tiny lucencies throughout the spine, pelvis and proximal femurs, better seen on prior CTs, most likely reflecting disease involvement. Suspected lucent lesion in the midshaft of the right humerus, also may reflect myelomatous disease involvement. Electronically Signed   By: Dahlia Bailiff M.D.   On: 05/24/2021 12:29   US Abdomen Limited RUQ (LIVER/GB)  Result Date: 05/25/2021 CLINICAL DATA:  Epigastric pain. EXAM: ULTRASOUND ABDOMEN LIMITED RIGHT UPPER QUADRANT COMPARISON:  None. FINDINGS: Gallbladder: A mild amount of heterogeneous, echogenic sludge is seen within the gallbladder lumen. No gallstones are identified. The gallbladder wall is diffusely echogenic and measures 3.2 mm in thickness. No sonographic Murphy sign noted by sonographer. Common bile duct: Diameter: 3.6 mm Liver: No focal lesion identified. Mild, diffusely increased echogenicity of the liver parenchyma is noted. Portal vein is patent on color Doppler imaging with normal direction of blood flow towards the liver. Other: None. IMPRESSION: 1. Mild amount of gallbladder sludge with findings suggestive of calcification within the gallbladder wall.  2. Mild hepatic steatosis without focal liver lesions. Electronically Signed   By: Virgina Norfolk M.D.   On: 05/25/2021 01:50     Medications:    cefTRIAXone (ROCEPHIN)  IV Stopped (05/25/21 1455)   metronidazole 500 mg (05/25/21 2104)   promethazine (PHENERGAN) injection (IM or IVPB)      nicotine  14 mg Transdermal Daily   pantoprazole  40 mg Oral Daily   acetaminophen **OR** acetaminophen, alum & mag hydroxide-simeth, HYDROcodone-acetaminophen, ondansetron (ZOFRAN) IV, polyethylene glycol, promethazine (PHENERGAN) injection (IM or IVPB)  Assessment/ Plan:  Renal insufficiency with unclear chronicity.  Suspected to be secondary to multiple myeloma.  Bone marrow biopsy per oncology.  Appreciate assistance from Dr. Benay Spice.  Creatinine continues to improve. Hypercalcemia.  Calcitonin and IV hydration.  Improvement noted.  We will continue to follow.  Noted some hypocalcemia slight overcorrection.  We will continue calcium carbonate increased to 4 times daily Hypophosphatemia will replete with sodium phosphate  Continue to avoid nephrotoxins.  No indication acute dialysis at this point.    LOS: [place] @TODAY @7 :40 AM

## 2021-05-26 NOTE — Progress Notes (Signed)
Ok to treat with Velcade today with elevated Scr. ? ?Acquanetta Belling, [place], BCPS, BCOP ?05/26/2021 ?10:56 AM ? ?

## 2021-05-26 NOTE — Progress Notes (Addendum)
IP PROGRESS NOTE ? ?Subjective:  ? ?[name] reports that he is having some abdominal cramping and loose stools intermittently.  He had some difficulty with this last night.  He took Maalox.  He thinks his symptoms are related to not eating yogurt on a regular basis.  No bleeding reported.  For day 4 cycle 1 Velcade dexamethasone ? ?Objective: ?Vital signs in last 24 hours: ?Blood pressure 106/65, pulse 78, temperature 98.9 ?F (37.2 ?C), temperature source Oral, resp. rate 17, height 5' 8"  (1.727 m), weight 77.1 kg, SpO2 97 %. ? ?Intake/Output from previous day: ?03/08 0701 - 03/09 0700 ?In: 1182.7 [P.O.:340; I.V.:467.8; IV Piggyback:299.9] ?Out: 0  ? ?Physical Exam: ? ?HEENT: No thrush or bleeding ?Cardiac: Regular rate and rhythm ?Abdomen: Soft, no hepatosplenomegaly, nontender ?Extremities: No leg edema ?Skin: No ecchymoses or petechiae, left iliac bone marrow site without bleeding ? ?Lab Results: ?Recent Labs  ?  05/25/21 ?0109 05/26/21 ?3235  ?WBC 3.2* 5.6  ?HGB 9.1* 9.0*  ?HCT 25.8* 25.2*  ?PLT 12* 11*  ? ? ?BMET ?Recent Labs  ?  05/25/21 ?5732 05/26/21 ?2025  ?NA 135 136  136  ?K 3.3* 3.4*  3.5  ?CL 109 109  110  ?CO2 17* 18*  18*  ?GLUCOSE 97 87  86  ?BUN 29* 18  18  ?CREATININE 2.56* 2.36*  2.31*  ?CALCIUM 6.6* 6.2*  6.2*  ? ? ?No results found for: CEA1, CEA, [id number], CA125 ? ?Studies/Results: ?DG Bone Survey Met ? ?Result Date: 05/24/2021 ?CLINICAL DATA:  Multiple myeloma EXAM: METASTATIC BONE SURVEY COMPARISON:  CTs from May 21, 2021 FINDINGS: No discrete lucent lesions in the calvarium. Scattered tiny lucencies throughout the spine, pelvis and proximal femurs, better seen on prior CTs likely reflect disease involvement. Suspected lucent lesion in the midshaft of the right humerus, may also reflect involvement. No focal airspace consolidation. No pleural effusion or pneumothorax. Nonobstructive bowel gas pattern. Aortic atherosclerosis. IMPRESSION: Scattered tiny lucencies throughout the spine,  pelvis and proximal femurs, better seen on prior CTs, most likely reflecting disease involvement. Suspected lucent lesion in the midshaft of the right humerus, also may reflect myelomatous disease involvement. Electronically Signed   By: Dahlia Bailiff M.D.   On: 05/24/2021 12:29  ? ?US Abdomen Limited RUQ (LIVER/GB) ? ?Result Date: 05/25/2021 ?CLINICAL DATA:  Epigastric pain. EXAM: ULTRASOUND ABDOMEN LIMITED RIGHT UPPER QUADRANT COMPARISON:  None. FINDINGS: Gallbladder: A mild amount of heterogeneous, echogenic sludge is seen within the gallbladder lumen. No gallstones are identified. The gallbladder wall is diffusely echogenic and measures 3.2 mm in thickness. No sonographic Murphy sign noted by sonographer. Common bile duct: Diameter: 3.6 mm Liver: No focal lesion identified. Mild, diffusely increased echogenicity of the liver parenchyma is noted. Portal vein is patent on color Doppler imaging with normal direction of blood flow towards the liver. Other: None. IMPRESSION: 1. Mild amount of gallbladder sludge with findings suggestive of calcification within the gallbladder wall. 2. Mild hepatic steatosis without focal liver lesions. Electronically Signed   By: Virgina Norfolk M.D.   On: 05/25/2021 01:50   ? ?Medications: I have reviewed the patient's current medications. ? ?Assessment/Plan: ?Multiple myeloma ?-05/22/2021-kappa free light chain 11.3, lambda free light chain 17,028 ?-25 g proteinuria, 20 g lambda light chains ?-Bone marrow biopsy 05/23/2021-hypercellular marrow involved by plasma cell myeloma, 80-90% plasma cells on the bone marrow biopsy, diminished iron stains ?-Cycle 1 Velcade/Decadron 05/23/2021 (Decadron given 05/22/2021) ?Renal failure secondary to #1-improved ?Hypercalcemia-status post treat with calcitonin, pamidronate 05/22/2021-resolved ?Lytic  bone lesions ?Anemia ?Thrombocytopenia ? ?Mr. Cork appears unchanged.  The bone marrow biopsy confirmed a diagnosis of multiple myeloma.  Renal function  slowly improving.  He will proceed with day for cycle #1 Velcade/dexamethasone today. ? ?He has severe thrombocytopenia secondary to multiple myeloma involving the bone marrow.  Transfuse platelets for platelet count less than 10,000.  Will adjust Velcade dose today based on platelets.  Hopefully, his platelet count will improve in the next few weeks. ? ?Recommendations: ?1.  Day 4 cycle 1 Velcade/Decadron today-okay to treat despite creatinine and platelet count.  Velcade dose has been adjusted. ?2.  Daily CBC, transfuse platelets for a count of less than 10,000 or bleeding ?3.  Outpatient follow-up at the Cancer center ? ?[name], NP  ?05/26/2021, 8:02 AM ?Mr. Agustin was interviewed and examined.  He reports "gas "pain at the upper abdomen last night.  He reports discomfort in the left ear.  No bleeding.  He is ambulating.  The renal function is further improved. ?He has persistent severe thrombocytopenia.  The thrombocytopenia predated Velcade and is likely secondary to extensive involvement of the bone marrow with multiple myeloma.  He understands the risk of bleeding.  We will provide platelet transfusion support as needed.  He agrees to proceed with Velcade today.  I dose reduced the Velcade. ? ?He will ask Dr. Eliseo Squires to evaluate the left ear discomfort. ? ?Outpatient follow-up at the Cancer center will be scheduled for 05/30/2021. ? ?I was present for greater than 50% of today's visit.  I performed a medical stage making. ? ? ?Future Appointments  ?Date Time Provider [place]  ?05/30/2021  9:30 AM [facility] PHLEBOTOMIST [facility] None  ?05/30/2021 10:00 AM [name], MD [facility] None  ?05/30/2021 11:00 AM [facility] CHAIR 2 [facility] None  ?06/02/2021  8:45 AM [facility] PHLEBOTOMIST [facility] None  ?06/02/2021  9:45 AM [facility] CHAIR 3 [facility] None  ?  ?

## 2021-05-26 NOTE — Progress Notes (Signed)
PROGRESS NOTE    Jordan King  TML:[id number] DOB: 1970/05/25 DOA: 05/21/2021 PCP: Ayesha Mohair, MD    Brief Narrative:  51 year old male with a past medical history of asthma, tobacco abuse, presents to the ED with multiple complaints including 3-week history of pain around right sided upper chest, back, since falling in his kitchen at home.  Found to have AKI and new diagnosis of multiple myeloma.      Assessment and Plan: * AKI (acute kidney injury) ([place]) Likely acute renal failure, but unclear chronicity Related to Multiple Myeloma Cr of 4.1, BUN 48 on admission -improving daily UA with trace proteinuria, moderate hemoglobinuria Nephrology consulted, no further work-up for now from a renal standpoint  Multiple myeloma [facility]) Oncology on board, started Velcade/decadron for treatment of MM on 05/23/21 -plan for another treatment on 3/9 -home in the AM if stable  Thrombocytopenia (H[facility])  persistent severe thrombocytopenia, likely related to multiple myeloma involving the bone marrow.   -follow the platelet count closely and transfuse platelets as needed.  Diverticulitis Noted on CT abdomen/pelvis Continue with IV antibiotics, Flagyl, ceftriaxone for a total of 5 days  Hypercalcemia Per nephrology: Calcitonin and IV hydration.  Noted some hypocalcemia slight overcorrection.  We will continue calcium carbonate increased to 4 times daily  Epigastric pain -protonix added with improvement -RUQ done with no acute issues  Tobacco abuse -encourage cessation          DVT prophylaxis: SCDs Start: 05/22/21 0016    Code Status: Full Code Family Communication: atr bedside  Disposition Plan:  Level of care: Telemetry Medical Status is: Inpatient Remains inpatient appropriate because: home in AM?    Consultants:  Renal Heme onc   Subjective: Occasional pains of "gas"  Objective: Vitals:   05/25/21 2000 05/25/21 2042 05/26/21 0539 05/26/21 0824  BP:  (!) 157/141 110/71 106/65 111/76  Pulse: 84 87 78 86  Resp: 18 18 17 18   Temp: 99.5 F (37.5 C) 98.7 F (37.1 C) 98.9 F (37.2 C) 98.4 F (36.9 C)  TempSrc: Oral Oral Oral Oral  SpO2: 100% 100% 97% 98%  Weight:      Height:        Intake/Output Summary (Last 24 hours) at 05/26/2021 1354 Last data filed at 05/26/2021 0600 Gross per 24 hour  Intake 748.6 ml  Output 0 ml  Net 748.6 ml   Filed Weights   05/21/21 1855  Weight: 77.1 kg    Examination:   General: Appearance:     Overweight male in no acute distress     Lungs:     Clear to auscultation bilaterally, respirations unlabored  Heart:    Normal heart rate. Normal rhythm. No murmurs, rubs, or gallops.    MS:   All extremities are intact.    Neurologic:   Awake, alert, oriented x 3. No apparent focal neurological           defect.        Data Reviewed: I have personally reviewed following labs and imaging studies  CBC: Recent Labs  Lab 05/22/21 0215 05/23/21 0206 05/24/21 0434 05/25/21 0344 05/26/21 0608  WBC 10.4 10.6* 4.8 3.2* 5.6  NEUTROABS 4.0 7.1 4.3 2.2 3.9  HGB 11.3* 12.2* 11.2* 9.1* 9.0*  HCT 31.5* 35.3* 32.1* 25.8* 25.2*  MCV 92.6 93.4 92.0 91.8 91.0  PLT 29* 31* 22* 12* 11*   Basic Metabolic Panel: Recent Labs  Lab 05/21/21 1923 05/22/21 0215 05/22/21 1015 05/23/21 0206 05/23/21 1012 05/24/21 0434  05/25/21 0344 05/26/21 0608  NA  --  135  --  137  --  136 135 136   136  K  --  4.1  --  4.1  --  3.5 3.3* 3.4*   3.5  CL  --  104  --  107  --  108 109 109   110  CO2  --  21*  --  17*  --  17* 17* 18*   18*  GLUCOSE  --  100*  --  155*  --  96 97 87   86  BUN  --  48*  --  52*  --  52* 29* 18   18  CREATININE  --  3.92*  --  3.94*  --  3.00* 2.56* 2.36*   2.31*  CALCIUM  --  11.5*   11.5*   < > 9.3 8.8* 7.6* 6.6* 6.2*   6.2*  MG 2.9* 2.9*  --   --   --   --   --   --   PHOS 5.3* 5.7*  --   --   --   --   --  1.3*   < > = values in this interval not displayed.   GFR: Estimated  Creatinine Clearance: 36.6 mL/min (A) (by C-G formula based on SCr of 2.36 mg/dL (H)). Liver Function Tests: Recent Labs  Lab 05/22/21 0215 05/23/21 0206 05/24/21 0434 05/25/21 0344 05/26/21 0608  AST 14* 15 18 16  14*  ALT 18 20 18 16 15   ALKPHOS 59 60 48 38 41  BILITOT 0.4 0.5 0.4 0.4 0.4  PROT 5.3* 5.5* 4.8* 4.3* 4.3*  ALBUMIN 2.6* 2.8* 2.6* 2.2* 2.1*   2.1*   Recent Labs  Lab 05/21/21 1920  LIPASE 24   No results for input(s): AMMONIA in the last 168 hours. Coagulation Profile: Recent Labs  Lab 05/22/21 0215  INR 1.1   Cardiac Enzymes: Recent Labs  Lab 05/21/21 1920  CKTOTAL 118   BNP (last 3 results) No results for input(s): PROBNP in the last 8760 hours. HbA1C: No results for input(s): HGBA1C in the last 72 hours. CBG: No results for input(s): GLUCAP in the last 168 hours. Lipid Profile: No results for input(s): CHOL, HDL, LDLCALC, TRIG, CHOLHDL, LDLDIRECT in the last 72 hours. Thyroid Function Tests: No results for input(s): TSH, T4TOTAL, FREET4, T3FREE, THYROIDAB in the last 72 hours. Anemia Panel: Recent Labs    05/24/21 1412  FERRITIN 583*   Sepsis Labs: No results for input(s): PROCALCITON, LATICACIDVEN in the last 168 hours.  Recent Results (from the past 240 hour(s))  Resp Panel by RT-PCR (Flu A&B, Covid) Nasopharyngeal Swab     Status: None   Collection Time: 05/21/21  9:23 PM   Specimen: Nasopharyngeal Swab; Nasopharyngeal(NP) swabs in vial transport medium  Result Value Ref Range Status   SARS Coronavirus 2 by RT PCR NEGATIVE NEGATIVE Final    Comment: (NOTE) SARS-CoV-2 target nucleic acids are NOT DETECTED.  The SARS-CoV-2 RNA is generally detectable in upper respiratory specimens during the acute phase of infection. The lowest concentration of SARS-CoV-2 viral copies this assay can detect is 138 copies/mL. A negative result does not preclude SARS-Cov-2 infection and should not be used as the sole basis for treatment or other patient  management decisions. A negative result may occur with  improper specimen collection/handling, submission of specimen other than nasopharyngeal swab, presence of viral mutation(s) within the areas targeted by this assay, and inadequate number of viral  copies(<138 copies/mL). A negative result must be combined with clinical observations, patient history, and epidemiological information. The expected result is Negative.  Fact Sheet for Patients:  EntrepreneurPulse.com.au  Fact Sheet for Healthcare Providers:  IncredibleEmployment.be  This test is no t yet approved or cleared by the [place] FDA and  has been authorized for detection and/or diagnosis of SARS-CoV-2 by FDA under an Emergency Use Authorization (EUA). This EUA will remain  in effect (meaning this test can be used) for the duration of the COVID-19 declaration under Section 564(b)(1) of the Act, 21 U.S.C.section 360bbb-3(b)(1), unless the authorization is terminated  or revoked sooner.       Influenza A by PCR NEGATIVE NEGATIVE Final   Influenza B by PCR NEGATIVE NEGATIVE Final    Comment: (NOTE) The Xpert Xpress SARS-CoV-2/FLU/RSV plus assay is intended as an aid in the diagnosis of influenza from Nasopharyngeal swab specimens and should not be used as a sole basis for treatment. Nasal washings and aspirates are unacceptable for Xpert Xpress SARS-CoV-2/FLU/RSV testing.  Fact Sheet for Patients: EntrepreneurPulse.com.au  Fact Sheet for Healthcare Providers: IncredibleEmployment.be  This test is not yet approved or cleared by the [place] FDA and has been authorized for detection and/or diagnosis of SARS-CoV-2 by FDA under an Emergency Use Authorization (EUA). This EUA will remain in effect (meaning this test can be used) for the duration of the COVID-19 declaration under Section 564(b)(1) of the Act, 21 U.S.C. section 360bbb-3(b)(1),  unless the authorization is terminated or revoked.  Performed at [facility], [place] [address] [place], [place] [address]          Radiology Studies: US Abdomen Limited RUQ (LIVER/GB)  Result Date: 05/25/2021 CLINICAL DATA:  Epigastric pain. EXAM: ULTRASOUND ABDOMEN LIMITED RIGHT UPPER QUADRANT COMPARISON:  None. FINDINGS: Gallbladder: A mild amount of heterogeneous, echogenic sludge is seen within the gallbladder lumen. No gallstones are identified. The gallbladder wall is diffusely echogenic and measures 3.2 mm in thickness. No sonographic Murphy sign noted by sonographer. Common bile duct: Diameter: 3.6 mm Liver: No focal lesion identified. Mild, diffusely increased echogenicity of the liver parenchyma is noted. Portal vein is patent on color Doppler imaging with normal direction of blood flow towards the liver. Other: None. IMPRESSION: 1. Mild amount of gallbladder sludge with findings suggestive of calcification within the gallbladder wall. 2. Mild hepatic steatosis without focal liver lesions. Electronically Signed   By: Virgina Norfolk M.D.   On: 05/25/2021 01:50        Scheduled Meds:  acidophilus  1 capsule Oral Daily   bortezomib SQ  1 mg/m2 (Treatment Plan Recorded) Subcutaneous Once   calcium carbonate  1,250 mg Oral QID   dexamethasone  40 mg Oral Once   loratadine  10 mg Oral Daily   nicotine  14 mg Transdermal Daily   pantoprazole  40 mg Oral Daily   prochlorperazine  10 mg Oral Once   Continuous Infusions:  cefTRIAXone (ROCEPHIN)  IV Stopped (05/25/21 1455)   metronidazole 500 mg (05/26/21 1008)   promethazine (PHENERGAN) injection (IM or IVPB)     sodium phosphate  Dextrose 5% IVPB 30 mmol (05/26/21 1117)     LOS: 5 days    Time spent: 65 minutes spent on chart review, discussion with nursing staff, consultants, updating family and interview/physical exam; more than 50% of that time was spent in counseling and/or coordination of care.    Geradine Girt, DO [facility] Available via Epic secure chat 7am-7pm  After these hours, please refer to coverage provider listed on [email or web address] 05/26/2021, 1:54 PM

## 2021-05-27 ENCOUNTER — Encounter: Payer: Self-pay | Admitting: Oncology

## 2021-05-27 ENCOUNTER — Other Ambulatory Visit: Payer: Self-pay | Admitting: *Deleted

## 2021-05-27 ENCOUNTER — Other Ambulatory Visit (HOSPITAL_COMMUNITY): Payer: Self-pay

## 2021-05-27 DIAGNOSIS — D696 Thrombocytopenia, unspecified: Secondary | ICD-10-CM | POA: Diagnosis not present

## 2021-05-27 DIAGNOSIS — C9000 Multiple myeloma not having achieved remission: Secondary | ICD-10-CM | POA: Diagnosis not present

## 2021-05-27 DIAGNOSIS — D631 Anemia in chronic kidney disease: Secondary | ICD-10-CM | POA: Diagnosis not present

## 2021-05-27 DIAGNOSIS — N179 Acute kidney failure, unspecified: Secondary | ICD-10-CM | POA: Diagnosis not present

## 2021-05-27 LAB — CBC WITH DIFFERENTIAL/PLATELET
Abs Immature Granulocytes: 0.15 10*3/uL — ABNORMAL HIGH (ref 0.00–0.07)
Basophils Absolute: 0 10*3/uL (ref 0.0–0.1)
Basophils Relative: 0 %
Eosinophils Absolute: 0 10*3/uL (ref 0.0–0.5)
Eosinophils Relative: 0 %
HCT: 26 % — ABNORMAL LOW (ref 39.0–52.0)
Hemoglobin: 9.5 g/dL — ABNORMAL LOW (ref 13.0–17.0)
Immature Granulocytes: 2 %
Lymphocytes Relative: 7 %
Lymphs Abs: 0.6 10*3/uL — ABNORMAL LOW (ref 0.7–4.0)
MCH: 33.5 pg (ref 26.0–34.0)
MCHC: 36.5 g/dL — ABNORMAL HIGH (ref 30.0–36.0)
MCV: 91.5 fL (ref 80.0–100.0)
Monocytes Absolute: 0.6 10*3/uL (ref 0.1–1.0)
Monocytes Relative: 7 %
Neutro Abs: 7.3 10*3/uL (ref 1.7–7.7)
Neutrophils Relative %: 84 %
Platelets: 19 10*3/uL — CL (ref 150–400)
RBC: 2.84 MIL/uL — ABNORMAL LOW (ref 4.22–5.81)
RDW: 15.8 % — ABNORMAL HIGH (ref 11.5–15.5)
WBC: 8.7 10*3/uL (ref 4.0–10.5)
nRBC: 0 % (ref 0.0–0.2)

## 2021-05-27 LAB — BASIC METABOLIC PANEL
Anion gap: 13 (ref 5–15)
BUN: 25 mg/dL — ABNORMAL HIGH (ref 6–20)
CO2: 15 mmol/L — ABNORMAL LOW (ref 22–32)
Calcium: 6.4 mg/dL — CL (ref 8.9–10.3)
Chloride: 110 mmol/L (ref 98–111)
Creatinine, Ser: 2.04 mg/dL — ABNORMAL HIGH (ref 0.61–1.24)
GFR, Estimated: 39 mL/min — ABNORMAL LOW (ref >60)
Glucose, Bld: 208 mg/dL — ABNORMAL HIGH (ref 70–99)
Potassium: 3.6 mmol/L (ref 3.5–5.1)
Sodium: 138 mmol/L (ref 135–145)

## 2021-05-27 LAB — COMPREHENSIVE METABOLIC PANEL
ALT: 13 U/L (ref 0–44)
AST: 13 U/L — ABNORMAL LOW (ref 15–41)
Albumin: 2.4 g/dL — ABNORMAL LOW (ref 3.5–5.0)
Alkaline Phosphatase: 46 U/L (ref 38–126)
Anion gap: 11 (ref 5–15)
BUN: 23 mg/dL — ABNORMAL HIGH (ref 6–20)
CO2: 16 mmol/L — ABNORMAL LOW (ref 22–32)
Calcium: 5.8 mg/dL — CL (ref 8.9–10.3)
Chloride: 111 mmol/L (ref 98–111)
Creatinine, Ser: 2.13 mg/dL — ABNORMAL HIGH (ref 0.61–1.24)
GFR, Estimated: 37 mL/min — ABNORMAL LOW (ref >60)
Glucose, Bld: 182 mg/dL — ABNORMAL HIGH (ref 70–99)
Potassium: 3.7 mmol/L (ref 3.5–5.1)
Sodium: 138 mmol/L (ref 135–145)
Total Bilirubin: 0.2 mg/dL — ABNORMAL LOW (ref 0.3–1.2)
Total Protein: 5 g/dL — ABNORMAL LOW (ref 6.5–8.1)

## 2021-05-27 LAB — RENAL FUNCTION PANEL
Albumin: 2.4 g/dL — ABNORMAL LOW (ref 3.5–5.0)
Anion gap: 11 (ref 5–15)
BUN: 23 mg/dL — ABNORMAL HIGH (ref 6–20)
CO2: 17 mmol/L — ABNORMAL LOW (ref 22–32)
Calcium: 5.8 mg/dL — CL (ref 8.9–10.3)
Chloride: 109 mmol/L (ref 98–111)
Creatinine, Ser: 2.11 mg/dL — ABNORMAL HIGH (ref 0.61–1.24)
GFR, Estimated: 38 mL/min — ABNORMAL LOW (ref >60)
Glucose, Bld: 182 mg/dL — ABNORMAL HIGH (ref 70–99)
Phosphorus: 2 mg/dL — ABNORMAL LOW (ref 2.5–4.6)
Potassium: 3.7 mmol/L (ref 3.5–5.1)
Sodium: 137 mmol/L (ref 135–145)

## 2021-05-27 MED ORDER — LORATADINE 10 MG PO TABS: 10.0000 mg | ORAL_TABLET | Freq: Every day | ORAL | Status: DC

## 2021-05-27 MED ORDER — CALCIUM CARBONATE 1250 (500 CA) MG PO TABS
1250.0000 mg | ORAL_TABLET | Freq: Four times a day (QID) | ORAL | Status: DC
  Administered 2021-05-27 (×2): 1250 mg via ORAL
  Filled 2021-05-27 (×2): qty 1

## 2021-05-27 MED ORDER — CALCIUM GLUCONATE-NACL 2-0.675 GM/100ML-% IV SOLN
2.0000 g | Freq: Once | INTRAVENOUS | Status: AC
  Administered 2021-05-27: 2000 mg via INTRAVENOUS
  Filled 2021-05-27: qty 100

## 2021-05-27 MED ORDER — CALCIUM CARBONATE ANTACID 750 MG PO CHEW: 2.0000 | CHEWABLE_TABLET | Freq: Two times a day (BID) | ORAL | 0 refills | Status: AC

## 2021-05-27 MED ORDER — POLYETHYLENE GLYCOL 3350 17 G PO PACK: 17.0000 g | PACK | Freq: Every day | ORAL | 0 refills | Status: AC | PRN

## 2021-05-27 MED ORDER — PANTOPRAZOLE SODIUM 40 MG PO TBEC
40.0000 mg | DELAYED_RELEASE_TABLET | Freq: Every day | ORAL | 0 refills | Status: DC
  Filled 2021-05-27: qty 30, 30d supply, fill #0

## 2021-05-27 MED ORDER — RISAQUAD PO CAPS: 1.0000 | ORAL_CAPSULE | Freq: Every day | ORAL | Status: DC

## 2021-05-27 NOTE — Progress Notes (Addendum)
IP PROGRESS NOTE ? ?Subjective:  ? ?No abdominal pain today.  Received a dose of calcium gluconate earlier today.  Awaiting repeat calcium level.  He is hoping to go home today.  He is not having any bleeding.  Received day 4 cycle 1 of chemotherapy today and tolerated well. ? ?Objective: ?Vital signs in last 24 hours: ?Blood pressure 119/74, pulse 80, temperature 97.9 ?F (36.6 ?C), temperature source Oral, resp. rate 18, height _0  (1.727 m), weight 77.1 kg, SpO2 98 %. ? ?Intake/Output from previous day: ?03/09 0701 - 03/10 0700 ?In: 75 [P.O.:660] ?Out: -  ? ?Physical Exam: ? ?HEENT: No thrush or bleeding ?Cardiac: Regular rate and rhythm ?Abdomen: Soft, no hepatosplenomegaly, nontender ?Extremities: No leg edema ?Skin: Few small ecchymoses over the arms and hands, no petechiae, left iliac bone marrow site without bleeding ? ?Lab Results: ?Recent Labs  ?  05/26/21 ?0608 05/27/21 ?7591  ?WBC 5.6 8.7  ?HGB 9.0* 9.5*  ?HCT 25.2* 26.0*  ?PLT 11* 19*  ? ?Blood smear review 05/26/2021: The platelets are decreased in number, scattered large platelets, no platelet clumps ?BMET ?Recent Labs  ?  05/26/21 ?0608 05/27/21 ?0717  ?NA 136  136 138  137  ?K 3.4*  3.5 3.7  3.7  ?CL 109  110 111  109  ?CO2 18*  18* 16*  17*  ?GLUCOSE 87  86 182*  182*  ?BUN 18  18 23*  23*  ?CREATININE 2.36*  2.31* 2.13*  2.11*  ?CALCIUM 6.2*  6.2* 5.8*  5.8*  ? ? ?No results found for: CEA1, CEA, [id number], CA125 ? ?Studies/Results: ?No results found. ? ?Medications: I have reviewed the patient's current medications. ? ?Assessment/Plan: ?Multiple myeloma ?-05/22/2021-kappa free light chain 11.3, lambda free light chain 17,028 ?-25 g proteinuria, 20 g lambda light chains ?-Serum immunofixation 05/22/2021-monoclonal lambda light chains ?-Bone marrow biopsy 05/23/2021-hypercellular marrow involved by plasma cell myeloma, 80-90% plasma cells on the bone marrow biopsy, diminished iron stains ?-Cycle 1 Velcade/Decadron 05/23/2021 (Decadron given  05/22/2021) ?Renal failure secondary to #1-improved ?Hypercalcemia-status post treat with calcitonin, pamidronate 05/22/2021-resolved ?Lytic bone lesions ?Anemia ?Thrombocytopenia ? ?Mr. Happ appears unchanged.  The bone marrow biopsy confirmed a diagnosis of multiple myeloma.  Renal function continues to improve.  He is now day 5 of cycle #1 of Velcade/dexamethasone. ? ?He has severe thrombocytopenia secondary to multiple myeloma involving the bone marrow.  Platelet count has improved to 19,000 today.  We will plan to transfuse platelets for platelet count less than 10,000.  Hopefully his platelet count will continue to improve over the next few weeks. ? ?His corrected calcium level is 6.7 today.  He received a dose of calcium gluconate 2 g IV.  He is also on oral calcium.  Confirmed with the [facility] that we can check calcium level on Monday when he returns for follow-up and give IV calcium gluconate if needed. ? ?Recommendations: ?1.  Recheck calcium level per primary team.  We can manage his hypocalcemia as an outpatient going forward. ?2.  Okay for discharge from our standpoint with outpatient follow-up on Monday for day 8 cycle 1 of his chemotherapy. ? ?[name], NP  ?05/27/2021, 11:52 AM ?Mr. Crunkleton was interviewed and examined.  He completed day 4 of cycle 1 Velcade/Decadron yesterday.  He tolerated the treatment well.  The platelet count is stable.  No indication for transfusion today. ?He is stable for discharge from an oncology standpoint.  He is scheduled for outpatient follow-up at the Cancer  center 05/30/2021. ? ?I was present for greater than 50% of today's visit.  I performed medical decision making. ?Future Appointments  ?Date Time Provider [place]  ?05/30/2021  9:30 AM [facility] PHLEBOTOMIST [facility] None  ?05/30/2021 10:00 AM [name], MD [facility] None  ?05/30/2021 11:00 AM [facility] CHAIR 2 [facility] None  ?06/02/2021  8:45 AM [facility] PHLEBOTOMIST [facility] None   ?06/02/2021  9:45 AM [facility] CHAIR 3 [facility] None  ?  ?

## 2021-05-27 NOTE — TOC Transition Note (Signed)
Transition of Care (TOC) - CM/SW Discharge Note ? ? ?Patient Details  ?Name: Jordan King ?MRN: [id number] ?Date of Birth: 01-11-1971 ? ?Transition of Care (TOC) CM/SW Contact:  ?Tom-Johnson, Renea Ee, RN ?Phone Number: ?05/27/2021, 2:35 PM ? ? ?Clinical Narrative:    ? ?Patient is scheduled for discharge today. Received day 4 cycle 1 of chemotherapy today and tolerated well. Followup appointments on AVS. No recommendations noted. Denies any TOC needs. Family to transport at discharge. No further TOC needs noted. ? ? ?Final next level of care: Home/Self Care ?Barriers to Discharge: Barriers Resolved ? ? ?Patient Goals and CMS Choice ?Patient states their goals for this hospitalization and ongoing recovery are:: To return home ?CMS Medicare.gov Compare Post Acute Care list provided to:: Patient ?Choice offered to / list presented to : NA ? ?Discharge Placement ?  ?           ?  ?Patient to be transferred to facility by: Family ?  ?  ? ?Discharge Plan and Services ?  ?Discharge Planning Services: CM Consult ?Post Acute Care Choice: NA          ?DME Arranged: N/A ?DME Agency: NA ?  ?  ?  ?HH Arranged: NA ?[place] Agency: NA ?  ?  ?  ? ?Social Determinants of Health (SDOH) Interventions ?  ? ? ?Readmission Risk Interventions ?No flowsheet data found. ? ? ? ? ?

## 2021-05-27 NOTE — Discharge Summary (Signed)
Physician Discharge Summary   Patient: Jordan King. MRN: [id number] DOB: [birth year]  Admit date:     05/21/2021  Discharge date: 05/27/2021  Discharge Physician: Geradine Girt   PCP: Ayesha Mohair, MD   Recommendations at discharge:   Bmp on Monday  Discharge Diagnoses: Principal Problem:   AKI (acute kidney injury) ([place]) Active Problems:   Multiple myeloma ([place])   Hypercalcemia   Diverticulitis   Thrombocytopenia ([place])   Tobacco abuse   Epigastric pain    Hospital Course: 51 year old male with a past medical history of asthma, tobacco abuse, presents to the ED with multiple complaints including 3-week history of pain around right sided upper chest, back, since falling in his kitchen at home.  Found to have AKI and new diagnosis of multiple myeloma.    Assessment and Plan: * AKI (acute kidney injury) ([place]) Likely acute renal failure, but unclear chronicity Related to Multiple Myeloma Cr of 4.1, BUN 48 on admission -improving daily UA with trace proteinuria, moderate hemoglobinuria Nephrology consulted, no further work-up for now from a renal standpoint  Multiple myeloma [facility]) Oncology on board, started Velcade/decadron for treatment of MM on 05/23/21 - another treatment on 3/9   Thrombocytopenia ([place])  persistent severe thrombocytopenia, likely related to multiple myeloma involving the bone marrow.   -trending up  Diverticulitis Noted on CT abdomen/pelvis Treated with:  Flagyl, ceftriaxone for a total of 5 days  Hypercalcemia Now hypocalcemia  Per nephrology: Calcitonin and IV hydration.  Noted some hypocalcemia slight overcorrection.   -repleted IV and PO - to be seen Monday for labs  Epigastric pain -protonix added with improvement -resolved  Tobacco abuse -encourage cessation        Consultants: renal/oncology Disposition: Home Diet recommendation:  Discharge Diet Orders (From admission, onward)     Start     Ordered   05/27/21  0000  Diet general        05/27/21 1400           Cardiac diet DISCHARGE MEDICATION: Allergies as of 05/27/2021   No Known Allergies      Medication List     STOP taking these medications    ibuprofen 200 MG tablet Commonly known as: ADVIL       TAKE these medications    acetaminophen 500 MG tablet Commonly known as: TYLENOL Take 1,000 mg by mouth every 6 (six) hours as needed for moderate pain or headache.   acidophilus Caps capsule Take 1 capsule by mouth daily.   albuterol 108 (90 Base) MCG/ACT inhaler Commonly known as: VENTOLIN HFA Inhale 2 puffs into the lungs every 6 (six) hours as needed for wheezing or shortness of breath.   bismuth subsalicylate 253 GU/44IH suspension Commonly known as: PEPTO BISMOL Take 30 mLs by mouth every 6 (six) hours as needed for diarrhea or loose stools or indigestion.   calcium carbonate 750 MG chewable tablet Commonly known as: TUMS EX Chew 2 tablets (1,500 mg total) by mouth 2 (two) times daily for 1 day. What changed:  when to take this reasons to take this   cyclobenzaprine 10 MG tablet Commonly known as: FLEXERIL Take 10 mg by mouth 3 (three) times daily as needed for muscle spasms.   loratadine 10 MG tablet Commonly known as: CLARITIN Take 1 tablet (10 mg total) by mouth daily.   pantoprazole 40 MG tablet Commonly known as: PROTONIX Take 1 tablet (40 mg total) by mouth daily.   polyethylene glycol 17 g packet  Commonly known as: MIRALAX / GLYCOLAX Take 17 g by mouth daily as needed for mild constipation.        Follow-up Information     Ayesha Mohair, MD Follow up in 1 week(s).   Specialty: Family Medicine Contact information: [place] [address] [phone]         Edrick Oh, MD Follow up.   Specialty: Nephrology Contact information: [place] [address] [phone]                Discharge Exam: Danley Danker Weights   05/21/21 1855  Weight: 77.1 kg      Condition at discharge: good  The results of significant diagnostics from this hospitalization (including imaging, microbiology, ancillary and laboratory) are listed below for reference.   Imaging Studies: CT ABDOMEN PELVIS WO CONTRAST  Result Date: 05/21/2021 CLINICAL DATA:  Flank and abdominal pain. Renal failure. Patient reports low back pain with nausea, vomiting, in frequent urination since fall in the kitchen on February 9th. EXAM: CT ABDOMEN AND PELVIS WITHOUT CONTRAST TECHNIQUE: Multidetector CT imaging of the abdomen and pelvis was performed following the standard protocol without IV contrast. RADIATION DOSE REDUCTION: This exam was performed according to the departmental dose-optimization program which includes automated exposure control, adjustment of the mA and/or kV according to patient size and/or use of iterative reconstruction technique. COMPARISON:  None. FINDINGS: Lower chest: Clear lung bases without focal airspace disease or pleural effusion. The heart is normal in size. Hepatobiliary: Suggestion of mild hepatic steatosis. There is no focal liver abnormality on this unenhanced exam. Decompressed gallbladder without calcified gallstone. No biliary dilatation. Pancreas: No ductal dilatation or inflammation. Spleen: Upper normal in size spanning 11.7 x 6.7 x 12.4 cm (volume = 510 cm^3). No focal splenic abnormality or perisplenic fluid. Adrenals/Urinary Tract: Normal adrenal glands. No hydronephrosis or renal calculi. No perinephric edema. No evidence of focal renal lesion. Decompressed ureters. No ureteral stone. Unremarkable urinary bladder. Stomach/Bowel: Decompressed stomach. Occasional fluid-filled small bowel without obstruction, inflammation or abnormal distention. Normal appendix. Diverticulosis from the distal transverse colon distally. There is equivocal faint stranding about a diverticulum involving the descending colon, series 3, image 46. No other diverticulitis or acute  colonic inflammation Vascular/Lymphatic: Moderate aortic atherosclerosis, advanced for age. No portal venous or mesenteric gas. No bulky abdominopelvic adenopathy. Reproductive: Prostate is unremarkable. Other: No free air or free fluid. There is a small fat containing umbilical hernia. Musculoskeletal: Lumbar spine assessed on lumbar spine reformats, reported separately. Heterogeneous marrow of the bony pelvis. Questionable lucent lesion within the right posterior lower eleventh rib, series 3, images 21 and 22 no fracture. Included musculature is unremarkable. IMPRESSION: 1. No renal stones or obstructive uropathy. 2. Equivocal faint stranding about a diverticulum involving the descending colon, which may represent mild diverticulitis. Multiple additional colonic diverticula from the splenic flexure distally. 3. Suggestion of mild hepatic steatosis. 4. Small fat containing umbilical hernia. 5. There is a questionable lucent lesion within the right posterior eleventh rib. This is nonspecific. Consider elective bone scan for further assessment Aortic Atherosclerosis (ICD10-I70.0). Electronically Signed   By: [name] Rake M.D.   On: 05/21/2021 20:34   DG Chest 2 View  Result Date: 05/21/2021 CLINICAL DATA:  Chest pain, history of prior rib fracture, initial encounter EXAM: CHEST - 2 VIEW COMPARISON:  None. FINDINGS: Cardiac shadow is within normal limits. Aortic calcifications are noted. The lungs are well aerated without focal infiltrate, effusion or pneumothorax. No discrete rib fracture is  noted. Mild inferior endplate deformity is noted in the lower thoracic spine but appears chronic in nature. Correlate to point tenderness. No other focal abnormality is noted. IMPRESSION: No discrete rib fracture is noted.  No pneumothorax is seen. Inferior endplate deformity in the lower thoracic spine which appears chronic in nature. Correlate to point tenderness. No other focal abnormality is noted. Electronically  Signed   By: Inez Catalina M.D.   On: 05/21/2021 19:27   CT L-SPINE NO CHARGE  Addendum Date: 05/22/2021   ADDENDUM REPORT: 05/22/2021 14:45 ADDENDUM: Upon further review, in addition to the L3 lucent lesion mentioned in the initial exam, bone marrow of the lumbar spine appears heterogeneous with suggestion of multiple small lucent lesions. Consulting provider has suspicion for multiple myeloma, and this may represent diffuse myelomatous involvement. Electronically Signed   By: [name] Rake M.D.   On: 05/22/2021 14:45   Result Date: 05/22/2021 CLINICAL DATA:  Low back pain. EXAM: CT LUMBAR SPINE WITHOUT CONTRAST TECHNIQUE: Multidetector CT imaging of the lumbar spine was performed without intravenous contrast administration. Multiplanar CT image reconstructions were also generated. RADIATION DOSE REDUCTION: This exam was performed according to the departmental dose-optimization program which includes automated exposure control, adjustment of the mA and/or kV according to patient size and/or use of iterative reconstruction technique. COMPARISON:  None. FINDINGS: Segmentation: 5 lumbar type vertebrae. Alignment: Normal. Vertebrae: No fracture. There is 6 mm lucency involving the anterior aspect of L3 superior endplate. Background bone marrow is heterogeneous. Paraspinal and other soft tissues: Assessed on concurrent abdominopelvic CT, reported separately. Disc levels: Minor disc bulge at L4-L5 and L5-S1. No spinal canal or neural foraminal narrowing. IMPRESSION: 1. No fracture or subluxation of the lumbar spine. 2. Heterogeneous bone marrow with a 6 mm lucency involving the anterior aspect of L3 superior endplate. Findings are nonspecific and may be related to osteopenia. MRI could be considered for further evaluation. 3. Minor disc bulge at L4-L5 and L5-S1. No spinal canal or neural foraminal narrowing. Electronically Signed: By: [name] Rake M.D. On: 05/21/2021 20:39   DG Bone Survey Met  Result Date:  05/24/2021 CLINICAL DATA:  Multiple myeloma EXAM: METASTATIC BONE SURVEY COMPARISON:  CTs from May 21, 2021 FINDINGS: No discrete lucent lesions in the calvarium. Scattered tiny lucencies throughout the spine, pelvis and proximal femurs, better seen on prior CTs likely reflect disease involvement. Suspected lucent lesion in the midshaft of the right humerus, may also reflect involvement. No focal airspace consolidation. No pleural effusion or pneumothorax. Nonobstructive bowel gas pattern. Aortic atherosclerosis. IMPRESSION: Scattered tiny lucencies throughout the spine, pelvis and proximal femurs, better seen on prior CTs, most likely reflecting disease involvement. Suspected lucent lesion in the midshaft of the right humerus, also may reflect myelomatous disease involvement. Electronically Signed   By: Dahlia Bailiff M.D.   On: 05/24/2021 12:29   CT BONE MARROW BIOPSY & ASPIRATION  Result Date: 05/23/2021 CLINICAL DATA:  Multiple myeloma evaluation EXAM: CT GUIDED DEEP ILIAC BONE ASPIRATION AND CORE BIOPSY TECHNIQUE: Patient was placed prone on the CT gantry and limited axial scans through the pelvis were obtained. This exam was performed according to the departmental dose-optimization program which includes automated exposure control, adjustment of the mA and/or kV according to patient size and/or use of iterative reconstruction technique. Appropriate skin entry site was identified. Skin site was marked, prepped with chlorhexidine, draped in usual sterile fashion, and infiltrated locally with 1% lidocaine. Intravenous Fentanyl 77mg and Versed 1.528mwere administered as conscious sedation during continuous  monitoring of the patient's level of consciousness and physiological / cardiorespiratory status by the radiology RN, with a total moderate sedation time of 10 minutes. Under CT fluoroscopic guidance an 11-gauge Cook trocar bone needle was advanced into the left iliac bone just lateral to the sacroiliac  joint. Once needle tip position was confirmed, core and aspiration samples were obtained, submitted to pathology for approval. Patient tolerated procedure well. COMPLICATIONS: COMPLICATIONS none IMPRESSION: 1. Technically successful CT guided left iliac bone core and aspiration biopsy. Electronically Signed   By: Lucrezia Europe M.D.   On: 05/23/2021 16:13   US Abdomen Limited RUQ (LIVER/GB)  Result Date: 05/25/2021 CLINICAL DATA:  Epigastric pain. EXAM: ULTRASOUND ABDOMEN LIMITED RIGHT UPPER QUADRANT COMPARISON:  None. FINDINGS: Gallbladder: A mild amount of heterogeneous, echogenic sludge is seen within the gallbladder lumen. No gallstones are identified. The gallbladder wall is diffusely echogenic and measures 3.2 mm in thickness. No sonographic Murphy sign noted by sonographer. Common bile duct: Diameter: 3.6 mm Liver: No focal lesion identified. Mild, diffusely increased echogenicity of the liver parenchyma is noted. Portal vein is patent on color Doppler imaging with normal direction of blood flow towards the liver. Other: None. IMPRESSION: 1. Mild amount of gallbladder sludge with findings suggestive of calcification within the gallbladder wall. 2. Mild hepatic steatosis without focal liver lesions. Electronically Signed   By: Virgina Norfolk M.D.   On: 05/25/2021 01:50    Microbiology: Results for orders placed or performed during the hospital encounter of 05/21/21  Resp Panel by RT-PCR (Flu A&B, Covid) Nasopharyngeal Swab     Status: None   Collection Time: 05/21/21  9:23 PM   Specimen: Nasopharyngeal Swab; Nasopharyngeal(NP) swabs in vial transport medium  Result Value Ref Range Status   SARS Coronavirus 2 by RT PCR NEGATIVE NEGATIVE Final    Comment: (NOTE) SARS-CoV-2 target nucleic acids are NOT DETECTED.  The SARS-CoV-2 RNA is generally detectable in upper respiratory specimens during the acute phase of infection. The lowest concentration of SARS-CoV-2 viral copies this assay can detect  is 138 copies/mL. A negative result does not preclude SARS-Cov-2 infection and should not be used as the sole basis for treatment or other patient management decisions. A negative result may occur with  improper specimen collection/handling, submission of specimen other than nasopharyngeal swab, presence of viral mutation(s) within the areas targeted by this assay, and inadequate number of viral copies(<138 copies/mL). A negative result must be combined with clinical observations, patient history, and epidemiological information. The expected result is Negative.  Fact Sheet for Patients:  EntrepreneurPulse.com.au  Fact Sheet for Healthcare Providers:  IncredibleEmployment.be  This test is no t yet approved or cleared by the [place] FDA and  has been authorized for detection and/or diagnosis of SARS-CoV-2 by FDA under an Emergency Use Authorization (EUA). This EUA will remain  in effect (meaning this test can be used) for the duration of the COVID-19 declaration under Section 564(b)(1) of the Act, 21 U.S.C.section 360bbb-3(b)(1), unless the authorization is terminated  or revoked sooner.       Influenza A by PCR NEGATIVE NEGATIVE Final   Influenza B by PCR NEGATIVE NEGATIVE Final    Comment: (NOTE) The Xpert Xpress SARS-CoV-2/FLU/RSV plus assay is intended as an aid in the diagnosis of influenza from Nasopharyngeal swab specimens and should not be used as a sole basis for treatment. Nasal washings and aspirates are unacceptable for Xpert Xpress SARS-CoV-2/FLU/RSV testing.  Fact Sheet for Patients: EntrepreneurPulse.com.au  Fact Sheet for Healthcare Providers:  IncredibleEmployment.be  This test is not yet approved or cleared by the [place] and has been authorized for detection and/or diagnosis of SARS-CoV-2 by FDA under an Emergency Use Authorization (EUA). This EUA will remain in effect  (meaning this test can be used) for the duration of the COVID-19 declaration under Section 564(b)(1) of the Act, 21 U.S.C. section 360bbb-3(b)(1), unless the authorization is terminated or revoked.  Performed at [facility], [place] [address], [place], [place] [address]     Labs: CBC: Recent Labs  Lab 05/23/21 0206 05/24/21 0434 05/25/21 0344 05/26/21 0608 05/27/21 0717  WBC 10.6* 4.8 3.2* 5.6 8.7  NEUTROABS 7.1 4.3 2.2 3.9 7.3  HGB 12.2* 11.2* 9.1* 9.0* 9.5*  HCT 35.3* 32.1* 25.8* 25.2* 26.0*  MCV 93.4 92.0 91.8 91.0 91.5  PLT 31* 22* 12* 11* 19*   Basic Metabolic Panel: Recent Labs  Lab 05/21/21 1923 05/22/21 0215 05/22/21 1015 05/24/21 0434 05/25/21 0344 05/26/21 0608 05/27/21 0717 05/27/21 1241  NA  --  135   < > 136 135 136   136 138   137 138  K  --  4.1   < > 3.5 3.3* 3.4*   3.5 3.7   3.7 3.6  CL  --  104   < > 108 109 109   110 111   109 110  CO2  --  21*   < > 17* 17* 18*   18* 16*   17* 15*  GLUCOSE  --  100*   < > 96 97 87   86 182*   182* 208*  BUN  --  48*   < > 52* 29* 18   18 23*   23* 25*  CREATININE  --  3.92*   < > 3.00* 2.56* 2.36*   2.31* 2.13*   2.11* 2.04*  CALCIUM  --  11.5*   11.5*   < > 7.6* 6.6* 6.2*   6.2* 5.8*   5.8* 6.4*  MG 2.9* 2.9*  --   --   --   --   --   --   PHOS 5.3* 5.7*  --   --   --  1.3* 2.0*  --    < > = values in this interval not displayed.   Liver Function Tests: Recent Labs  Lab 05/23/21 0206 05/24/21 0434 05/25/21 0344 05/26/21 0608 05/27/21 0717  AST 15 18 16  14* 13*  ALT 20 18 16 15 13   ALKPHOS 60 48 38 41 46  BILITOT 0.5 0.4 0.4 0.4 0.2*  PROT 5.5* 4.8* 4.3* 4.3* 5.0*  ALBUMIN 2.8* 2.6* 2.2* 2.1*   2.1* 2.4*   2.4*   CBG: No results for input(s): GLUCAP in the last 168 hours.  Discharge time spent: greater than 30 minutes.  Signed: Geradine Girt, DO [facility] 05/28/2021

## 2021-05-27 NOTE — Progress Notes (Addendum)
[facility] ?ROUNDING NOTE  ? ?Subjective:  ? ?Interval History: 51 year old gentleman admitted with hypercalcemia and renal insufficiency.  Was admitted for IV saline and work-up for multiple myeloma.  Underwent r bone marrow biopsy 05/23/2020.  Appreciate assistance of Dr. Benay Spice.  Patient has received Decadron and medronate. ? ?Blood pressure 113/60 pulse 72 temperature 97.6 O2 sats 97% room air ? ?Sodium 137 potassium 3.7 chloride 109 CO2 17 BUN 23 creatinine 2.11 calcium 5.8 albumin 2.4 ? ? ?Objective:  ?Vital signs in last 24 hours:  ?Temp:  [97.6 ?F (36.4 ?C)-98.5 ?F (36.9 ?C)] 97.6 ?F (36.4 ?C) (03/10 0541) ?Pulse Rate:  [72-86] 72 (03/10 0541) ?Resp:  [17-18] 17 (03/10 0541) ?BP: (109-113)/(63-76) 113/63 (03/10 0541) ?SpO2:  [96 %-98 %] 97 % (03/10 0541) ? ?Weight change:  ?Filed Weights  ? 05/21/21 1855  ?Weight: 77.1 kg  ? ? ?Intake/Output: ?I/O last 3 completed shifts: ?In: 879.9 [P.O.:780; IV Piggyback:99.9] ?Out: 0  ?  ?Intake/Output this shift: ? No intake/output data recorded. ? ?CVS- RRR no murmurs rubs or gallops ?RS- CTA fine basal crackles ?ABD- BS present soft non-distended ?EXT- no edema ? ? ?Basic Metabolic Panel: ?Recent Labs  ?Lab 05/21/21 ?1923 05/22/21 ?0215 05/22/21 ?1015 05/23/21 ?0206 05/23/21 ?1012 05/24/21 ?0434 05/25/21 ?5859 05/26/21 ?2924  ?NA  --  135  --  137  --  136 135 136  136  ?K  --  4.1  --  4.1  --  3.5 3.3* 3.4*  3.5  ?CL  --  104  --  107  --  108 109 109  110  ?CO2  --  21*  --  17*  --  17* 17* 18*  18*  ?GLUCOSE  --  100*  --  155*  --  96 97 87  86  ?BUN  --  48*  --  52*  --  52* 29* 18  18  ?CREATININE  --  3.92*  --  3.94*  --  3.00* 2.56* 2.36*  2.31*  ?CALCIUM  --  11.5*  11.5*   < > 9.3   < > 7.6* 6.6* 6.2*  6.2*  ?MG 2.9* 2.9*  --   --   --   --   --   --   ?PHOS 5.3* 5.7*  --   --   --   --   --  1.3*  ? < > = values in this interval not displayed.  ? ? ? ?Liver Function Tests: ?Recent Labs  ?Lab 05/22/21 ?0215 05/23/21 ?0206  05/24/21 ?4628 05/25/21 ?6381 05/26/21 ?7711  ?AST 14* 15 18 16  14*  ?ALT 18 20 18 16 15   ?ALKPHOS 59 60 48 38 41  ?BILITOT 0.4 0.5 0.4 0.4 0.4  ?PROT 5.3* 5.5* 4.8* 4.3* 4.3*  ?ALBUMIN 2.6* 2.8* 2.6* 2.2* 2.1*  2.1*  ? ? ?Recent Labs  ?Lab 05/21/21 ?1920  ?LIPASE 24  ? ? ?No results for input(s): AMMONIA in the last 168 hours. ? ?CBC: ?Recent Labs  ?Lab 05/22/21 ?0215 05/23/21 ?0206 05/24/21 ?6579 05/25/21 ?0383 05/26/21 ?3383  ?WBC 10.4 10.6* 4.8 3.2* 5.6  ?NEUTROABS 4.0 7.1 4.3 2.2 3.9  ?HGB 11.3* 12.2* 11.2* 9.1* 9.0*  ?HCT 31.5* 35.3* 32.1* 25.8* 25.2*  ?MCV 92.6 93.4 92.0 91.8 91.0  ?PLT 29* 31* 22* 12* 11*  ? ? ? ?Cardiac Enzymes: ?Recent Labs  ?Lab 05/21/21 ?1920  ?CKTOTAL 118  ? ? ? ?BNP: ?Invalid input(s): POCBNP ? ?CBG: ?No results for input(s):  GLUCAP in the last 168 hours. ? ?Microbiology: ?Results for orders placed or performed during the hospital encounter of 05/21/21  ?Resp Panel by RT-PCR (Flu A&B, Covid) Nasopharyngeal Swab     Status: None  ? Collection Time: 05/21/21  9:23 PM  ? Specimen: Nasopharyngeal Swab; Nasopharyngeal(NP) swabs in vial transport medium  ?Result Value Ref Range Status  ? SARS Coronavirus 2 by RT PCR NEGATIVE NEGATIVE Final  ?  Comment: (NOTE) ?SARS-CoV-2 target nucleic acids are NOT DETECTED. ? ?The SARS-CoV-2 RNA is generally detectable in upper respiratory ?specimens during the acute phase of infection. The lowest ?concentration of SARS-CoV-2 viral copies this assay can detect is ?138 copies/mL. A negative result does not preclude SARS-Cov-2 ?infection and should not be used as the sole basis for treatment or ?other patient management decisions. A negative result may occur with  ?improper specimen collection/handling, submission of specimen other ?than nasopharyngeal swab, presence of viral mutation(s) within the ?areas targeted by this assay, and inadequate number of viral ?copies(<138 copies/mL). A negative result must be combined with ?clinical observations, patient  history, and epidemiological ?information. The expected result is Negative. ? ?Fact Sheet for Patients:  ?EntrepreneurPulse.com.au ? ?Fact Sheet for Healthcare Providers:  ?IncredibleEmployment.be ? ?This test is no t yet approved or cleared by the [place] FDA and  ?has been authorized for detection and/or diagnosis of SARS-CoV-2 by ?FDA under an Emergency Use Authorization (EUA). This EUA will remain  ?in effect (meaning this test can be used) for the duration of the ?COVID-19 declaration under Section 564(b)(1) of the Act, 21 ?U.S.C.section 360bbb-3(b)(1), unless the authorization is terminated  ?or revoked sooner.  ? ? ?  ? Influenza A by PCR NEGATIVE NEGATIVE Final  ? Influenza B by PCR NEGATIVE NEGATIVE Final  ?  Comment: (NOTE) ?The Xpert Xpress SARS-CoV-2/FLU/RSV plus assay is intended as an aid ?in the diagnosis of influenza from Nasopharyngeal swab specimens and ?should not be used as a sole basis for treatment. Nasal washings and ?aspirates are unacceptable for Xpert Xpress SARS-CoV-2/FLU/RSV ?testing. ? ?Fact Sheet for Patients: ?EntrepreneurPulse.com.au ? ?Fact Sheet for Healthcare Providers: ?IncredibleEmployment.be ? ?This test is not yet approved or cleared by the [place] FDA and ?has been authorized for detection and/or diagnosis of SARS-CoV-2 by ?FDA under an Emergency Use Authorization (EUA). This EUA will remain ?in effect (meaning this test can be used) for the duration of the ?COVID-19 declaration under Section 564(b)(1) of the Act, 21 U.S.C. ?section 360bbb-3(b)(1), unless the authorization is terminated or ?revoked. ? ?Performed at [facility], [place] [address], [place], [place] ?[address] ?  ? ? ?Coagulation Studies: ?No results for input(s): LABPROT, INR in the last 72 hours. ? ? ?Urinalysis: ?No results for input(s): COLORURINE, LABSPEC, Clara, GLUCOSEU, HGBUR, BILIRUBINUR, KETONESUR, PROTEINUR,  UROBILINOGEN, NITRITE, LEUKOCYTESUR in the last 72 hours. ? ?Invalid input(s): APPERANCEUR ?  ? ? ?Imaging: ?No results found. ? ? ?Medications:  ? ? promethazine (PHENERGAN) injection (IM or IVPB)    ? ? acidophilus  1 capsule Oral Daily  ? loratadine  10 mg Oral Daily  ? nicotine  14 mg Transdermal Daily  ? pantoprazole  40 mg Oral Daily  ? ?acetaminophen **OR** acetaminophen, alum & mag hydroxide-simeth, HYDROcodone-acetaminophen, ondansetron (ZOFRAN) IV, polyethylene glycol, promethazine (PHENERGAN) injection (IM or IVPB) ? ?Assessment/ Plan:  ?Renal insufficiency with unclear chronicity.  Suspected to be secondary to multiple myeloma.  Bone marrow biopsy per oncology.  Appreciate assistance from Dr. Benay Spice.  Creatinine continues to improve. ?  Hypercalcemia.  Calcitonin and IV hydration.  Improvement noted.  We will continue to follow.  Noted some hypocalcemia slight overcorrection.  We will continue calcium carbonate increased to 4 times daily.  2 g calcium gluconate ordered. ?Hypophosphatemia will replete with sodium phosphate ?  ? ? ? LOS: 6 ?Sherril Croon ?@TODAY @8 :14 AM ?  ?

## 2021-05-28 ENCOUNTER — Telehealth: Payer: Self-pay | Admitting: Hematology

## 2021-05-28 ENCOUNTER — Other Ambulatory Visit: Payer: Self-pay | Admitting: Hematology

## 2021-05-28 LAB — PATHOLOGIST SMEAR REVIEW: Path Review: NORMAL

## 2021-05-28 MED ORDER — HYDROCODONE-ACETAMINOPHEN 5-325 MG PO TABS: 1.0000 | ORAL_TABLET | Freq: Four times a day (QID) | ORAL | 0 refills | Status: DC | PRN

## 2021-05-28 MED ORDER — LORAZEPAM 0.5 MG PO TABS: 0.5000 mg | ORAL_TABLET | Freq: Three times a day (TID) | ORAL | 0 refills | Status: DC | PRN

## 2021-05-28 NOTE — Telephone Encounter (Signed)
Pt called this morning for worsening back pain.  He was discharged from hospital yesterday, his newly diagnosed multiple myeloma and had chemotherapy in the hospital.  He has been having low back pain lately, probably related to his multiple myeloma.  The patient states that he received pain medication in the hospital, but no pain prescription was given on discharge.  He felt in the bathtub yesterday, landed on the back, and his pain has been much worse since last night.  He is still able to walk, no shooting pain to hip or leg.  His wife reports he has been very nervous, shaking, irritable this morning, from his smoking cessation. He does not want to go back to ED. I called in limited amount of Vicodin and low-dose Ativan for his pain and anxiety, side effects reviewed with patient and his wife, he knows not to take both at the same time, his wife will also pick up nicotine patch from pharmacy.  All questions were answered.  He has appointment with Dr. Benay Spice on Monday, I will copy Dr. Benay Spice to see if he needs x-ray to rule out fracture. ? ?Truitt Merle  ?05/28/2021 9:45 AM  ? ?

## 2021-05-29 ENCOUNTER — Other Ambulatory Visit: Payer: Self-pay | Admitting: Oncology

## 2021-05-29 LAB — PTH-RELATED PEPTIDE: PTH-related peptide: <2 pmol/L

## 2021-05-30 ENCOUNTER — Inpatient Hospital Stay: Payer: BLUE CROSS/BLUE SHIELD | Admitting: Oncology

## 2021-05-30 ENCOUNTER — Other Ambulatory Visit: Payer: Self-pay

## 2021-05-30 ENCOUNTER — Other Ambulatory Visit (HOSPITAL_BASED_OUTPATIENT_CLINIC_OR_DEPARTMENT_OTHER): Payer: Self-pay

## 2021-05-30 ENCOUNTER — Inpatient Hospital Stay: Payer: BLUE CROSS/BLUE SHIELD

## 2021-05-30 ENCOUNTER — Encounter: Payer: Self-pay | Admitting: Oncology

## 2021-05-30 ENCOUNTER — Inpatient Hospital Stay: Payer: BLUE CROSS/BLUE SHIELD | Attending: Oncology

## 2021-05-30 ENCOUNTER — Encounter: Payer: Self-pay | Admitting: *Deleted

## 2021-05-30 VITALS — BP 111/66 | HR 102 | Temp 98.1°F | Resp 20 | Ht 68.0 in | Wt 166.8 lb

## 2021-05-30 VITALS — BP 101/73 | HR 61

## 2021-05-30 DIAGNOSIS — C9000 Multiple myeloma not having achieved remission: Secondary | ICD-10-CM | POA: Insufficient documentation

## 2021-05-30 DIAGNOSIS — D696 Thrombocytopenia, unspecified: Secondary | ICD-10-CM | POA: Diagnosis not present

## 2021-05-30 DIAGNOSIS — Z79899 Other long term (current) drug therapy: Secondary | ICD-10-CM | POA: Diagnosis not present

## 2021-05-30 DIAGNOSIS — Z5112 Encounter for antineoplastic immunotherapy: Secondary | ICD-10-CM | POA: Diagnosis present

## 2021-05-30 DIAGNOSIS — E876 Hypokalemia: Secondary | ICD-10-CM

## 2021-05-30 DIAGNOSIS — D649 Anemia, unspecified: Secondary | ICD-10-CM | POA: Diagnosis not present

## 2021-05-30 LAB — CBC WITH DIFFERENTIAL (CANCER CENTER ONLY)
Abs Immature Granulocytes: 0.18 10*3/uL — ABNORMAL HIGH (ref 0.00–0.07)
Basophils Absolute: 0 10*3/uL (ref 0.0–0.1)
Basophils Relative: 0 %
Eosinophils Absolute: 0.1 10*3/uL (ref 0.0–0.5)
Eosinophils Relative: 1 %
HCT: 25.2 % — ABNORMAL LOW (ref 39.0–52.0)
Hemoglobin: 8.8 g/dL — ABNORMAL LOW (ref 13.0–17.0)
Immature Granulocytes: 1 %
Lymphocytes Relative: 8 %
Lymphs Abs: 1.1 10*3/uL (ref 0.7–4.0)
MCH: 31.7 pg (ref 26.0–34.0)
MCHC: 34.9 g/dL (ref 30.0–36.0)
MCV: 90.6 fL (ref 80.0–100.0)
Monocytes Absolute: 0.8 10*3/uL (ref 0.1–1.0)
Monocytes Relative: 6 %
Neutro Abs: 11.6 10*3/uL — ABNORMAL HIGH (ref 1.7–7.7)
Neutrophils Relative %: 84 %
Platelet Count: 35 10*3/uL — ABNORMAL LOW (ref 150–400)
RBC: 2.78 MIL/uL — ABNORMAL LOW (ref 4.22–5.81)
RDW: 16.5 % — ABNORMAL HIGH (ref 11.5–15.5)
WBC Count: 13.8 10*3/uL — ABNORMAL HIGH (ref 4.0–10.5)
nRBC: 0 % (ref 0.0–0.2)

## 2021-05-30 LAB — HEPATIC FUNCTION PANEL
ALT: 12 U/L (ref 0–44)
AST: 11 U/L — ABNORMAL LOW (ref 15–41)
Albumin: 3.1 g/dL — ABNORMAL LOW (ref 3.5–5.0)
Alkaline Phosphatase: 46 U/L (ref 38–126)
Bilirubin, Direct: 0.2 mg/dL (ref 0.0–0.2)
Indirect Bilirubin: 0.4 mg/dL (ref 0.3–0.9)
Total Bilirubin: 0.6 mg/dL (ref 0.3–1.2)
Total Protein: 5.7 g/dL — ABNORMAL LOW (ref 6.5–8.1)

## 2021-05-30 LAB — BASIC METABOLIC PANEL - CANCER CENTER ONLY
Anion gap: 11 (ref 5–15)
BUN: 18 mg/dL (ref 6–20)
CO2: 20 mmol/L — ABNORMAL LOW (ref 22–32)
Calcium: 6.2 mg/dL — CL (ref 8.9–10.3)
Chloride: 105 mmol/L (ref 98–111)
Creatinine: 1.73 mg/dL — ABNORMAL HIGH (ref 0.61–1.24)
GFR, Estimated: 48 mL/min — ABNORMAL LOW (ref >60)
Glucose, Bld: 110 mg/dL — ABNORMAL HIGH (ref 70–99)
Potassium: 3.4 mmol/L — ABNORMAL LOW (ref 3.5–5.1)
Sodium: 136 mmol/L (ref 135–145)

## 2021-05-30 LAB — TYPE AND SCREEN
ABO/RH(D): O POS
Antibody Screen: NEGATIVE

## 2021-05-30 LAB — PRETREATMENT RBC PHENOTYPE

## 2021-05-30 MED ORDER — OXYCODONE-ACETAMINOPHEN 5-325 MG PO TABS
1.0000 | ORAL_TABLET | Freq: Four times a day (QID) | ORAL | 0 refills | Status: DC | PRN
  Filled 2021-05-30: qty 30, 7d supply, fill #0

## 2021-05-30 MED ORDER — SODIUM CHLORIDE 0.9 % IV SOLN: Freq: Once | INTRAVENOUS | Status: AC

## 2021-05-30 MED ORDER — CALCIUM GLUCONATE-NACL 1-0.675 GM/50ML-% IV SOLN
1.0000 g | Freq: Once | INTRAVENOUS | Status: AC
  Administered 2021-05-30: 1000 mg via INTRAVENOUS
  Filled 2021-05-30: qty 50

## 2021-05-30 MED ORDER — DEXAMETHASONE 4 MG PO TABS
20.0000 mg | ORAL_TABLET | Freq: Once | ORAL | Status: AC
  Administered 2021-05-30: 20 mg via ORAL
  Filled 2021-05-30: qty 5

## 2021-05-30 MED ORDER — SODIUM CHLORIDE 0.9 % IV SOLN: INTRAVENOUS | Status: AC

## 2021-05-30 MED ORDER — BORTEZOMIB CHEMO SQ INJECTION 3.5 MG (2.5MG/ML)
1.0000 mg/m2 | Freq: Once | INTRAMUSCULAR | Status: AC
  Administered 2021-05-30: 2 mg via SUBCUTANEOUS
  Filled 2021-05-30: qty 0.8

## 2021-05-30 MED ORDER — PROCHLORPERAZINE MALEATE 10 MG PO TABS
10.0000 mg | ORAL_TABLET | Freq: Once | ORAL | Status: AC
  Administered 2021-05-30: 10 mg via ORAL
  Filled 2021-05-30: qty 1

## 2021-05-30 MED ORDER — POTASSIUM CHLORIDE 10 MEQ/100ML IV SOLN
10.0000 meq | INTRAVENOUS | Status: AC
  Administered 2021-05-30 (×2): 10 meq via INTRAVENOUS
  Filled 2021-05-30: qty 100

## 2021-05-30 MED ORDER — OXYCODONE HCL 5 MG PO TABS
5.0000 mg | ORAL_TABLET | Freq: Once | ORAL | Status: AC
  Administered 2021-05-30: 5 mg via ORAL
  Filled 2021-05-30: qty 1

## 2021-05-30 MED ORDER — ACETAMINOPHEN 325 MG PO TABS
650.0000 mg | ORAL_TABLET | Freq: Once | ORAL | Status: AC
  Administered 2021-05-30: 650 mg via ORAL
  Filled 2021-05-30: qty 2

## 2021-05-30 NOTE — Progress Notes (Signed)
Pharmacist Chemotherapy Monitoring - Initial Assessment   ? ?Anticipated start date: 05/30/21  ? ?The following has been reviewed per standard work regarding the patient's treatment regimen: ?The patient's diagnosis, treatment plan and drug doses, and organ/hematologic function ?Lab orders and baseline tests specific to treatment regimen  ?The treatment plan start date, drug sequencing, and pre-medications ?Prior authorization status  ?Patient's documented medication list, including drug-drug interaction screen and prescriptions for anti-emetics and supportive care specific to the treatment regimen ?The drug concentrations, fluid compatibility, administration routes, and timing of the medications to be used ?The patient's access for treatment and lifetime cumulative dose history, if applicable  ?The patient's medication allergies and previous infusion related reactions, if applicable  ? ?Changes made to treatment plan:  ?N/A ? ?Follow up needed:  ?N/A ? ? ?Patrica Duel, RPH, ?05/30/2021  11:21 AM  ?

## 2021-05-30 NOTE — Patient Instructions (Signed)
[facility]  Discharge Instructions: Thank you for choosing [facility] to provide your oncology and hematology care.   If you have a lab appointment with the [facility], please go directly to the [facility] and check in at the registration area.   Wear comfortable clothing and clothing appropriate for easy access to any Portacath or PICC line.   We strive to give you quality time with your provider. You may need to reschedule your appointment if you arrive late (15 or more minutes).  Arriving late affects you and other patients whose appointments are after yours.  Also, if you miss three or more appointments without notifying the office, you may be dismissed from the clinic at the providers discretion.      For prescription refill requests, have your pharmacy contact our office and allow 72 hours for refills to be completed.    Today you received the following chemotherapy and/or immunotherapy agents: bortezomib   Bortezomib injection What is this medication? BORTEZOMIB (bor TEZ oh mib) targets proteins in cancer cells and stops the cancer cells from growing. It treats multiple myeloma and mantle cell lymphoma. This medicine may be used for other purposes; ask your health care provider or pharmacist if you have questions. COMMON BRAND NAME(S): Velcade What should I tell my care team before I take this medication? They need to know if you have any of these conditions: dehydration diabetes (high blood sugar) heart disease liver disease tingling of the fingers or toes or other nerve disorder an unusual or allergic reaction to bortezomib, mannitol, boron, other medicines, foods, dyes, or preservatives pregnant or trying to get pregnant breast-feeding How should I use this medication? This medicine is injected into a vein or under the skin. It is given by a health care provider in a hospital or clinic setting. Talk to your health care  provider about the use of this medicine in children. Special care may be needed. Overdosage: If you think you have taken too much of this medicine contact a poison control center or emergency room at once. NOTE: This medicine is only for you. Do not share this medicine with others. What if I miss a dose? Keep appointments for follow-up doses. It is important not to miss your dose. Call your health care provider if you are unable to keep an appointment. What may interact with this medication? This medicine may interact with the following medications: ketoconazole rifampin This list may not describe all possible interactions. Give your health care provider a list of all the medicines, herbs, non-prescription drugs, or dietary supplements you use. Also tell them if you smoke, drink alcohol, or use illegal drugs. Some items may interact with your medicine. What should I watch for while using this medication? Your condition will be monitored carefully while you are receiving this medicine. You may need blood work done while you are taking this medicine. You may get drowsy or dizzy. Do not drive, use machinery, or do anything that needs mental alertness until you know how this medicine affects you. Do not stand up or sit up quickly, especially if you are an older patient. This reduces the risk of dizzy or fainting spells This medicine may increase your risk of getting an infection. Call your health care provider for advice if you get a fever, chills, sore throat, or other symptoms of a cold or flu. Do not treat yourself. Try to avoid being around people who are sick. Check with  your health care provider if you have severe diarrhea, nausea, and vomiting, or if you sweat a lot. The loss of too much body fluid may make it dangerous for you to take this medicine. Do not become pregnant while taking this medicine or for 7 months after stopping it. Women should inform their health care provider if they wish to  become pregnant or think they might be pregnant. Men should not father a child while taking this medicine and for 4 months after stopping it. There is a potential for serious harm to an unborn child. Talk to your health care provider for more information. Do not breast-feed an infant while taking this medicine or for 2 months after stopping it. This medicine may make it more difficult to get pregnant or father a child. Talk to your health care provider if you are concerned about your fertility. What side effects may I notice from receiving this medication? Side effects that you should report to your doctor or health care professional as soon as possible: allergic reactions (skin rash; itching or hives; swelling of the face, lips, or tongue) bleeding (bloody or black, tarry stools; red or dark brown urine; spitting up blood or brown material that looks like coffee grounds; red spots on the skin; unusual bruising or bleeding from the eye, gums, or nose) blurred vision or changes in vision confusion constipation headache heart failure (trouble breathing; fast, irregular heartbeat; sudden weight gain; swelling of the ankles, feet, hands) infection (fever, chills, cough, sore throat, pain or trouble passing urine) lack or loss of appetite liver injury (dark yellow or brown urine; general ill feeling or flu-like symptoms; loss of appetite, right upper belly pain; yellowing of the eyes or skin) low blood pressure (dizziness; feeling faint or lightheaded, falls; unusually weak or tired) muscle cramps pain, redness, or irritation at site where injected pain, tingling, numbness in the hands or feet seizures trouble breathing unusual bruising or bleeding Side effects that usually do not require medical attention (report to your doctor or health care professional if they continue or are bothersome): diarrhea nausea stomach pain trouble sleeping vomiting This list may not describe all possible side  effects. Call your doctor for medical advice about side effects. You may report side effects to FDA at 1-800-FDA-1088. Where should I keep my medication? This medicine is given in a hospital or clinic. It will not be stored at home. NOTE: This sheet is a summary. It may not cover all possible information. If you have questions about this medicine, talk to your doctor, pharmacist, or health care provider.  2022 Elsevier/Gold Standard (2020-02-26 00:00:00)       To help prevent nausea and vomiting after your treatment, we encourage you to take your nausea medication as directed.  BELOW ARE SYMPTOMS THAT SHOULD BE REPORTED IMMEDIATELY: *FEVER GREATER THAN 100.4 F (38 C) OR HIGHER *CHILLS OR SWEATING *NAUSEA AND VOMITING THAT IS NOT CONTROLLED WITH YOUR NAUSEA MEDICATION *UNUSUAL SHORTNESS OF BREATH *UNUSUAL BRUISING OR BLEEDING *URINARY PROBLEMS (pain or burning when urinating, or frequent urination) *BOWEL PROBLEMS (unusual diarrhea, constipation, pain near the anus) TENDERNESS IN MOUTH AND THROAT WITH OR WITHOUT PRESENCE OF ULCERS (sore throat, sores in mouth, or a toothache) UNUSUAL RASH, SWELLING OR PAIN  UNUSUAL VAGINAL DISCHARGE OR ITCHING   Items with * indicate a potential emergency and should be followed up as soon as possible or go to the Emergency Department if any problems should occur.  Please show the CHEMOTHERAPY ALERT CARD or IMMUNOTHERAPY  ALERT CARD at check-in to the Emergency Department and triage nurse.  Should you have questions after your visit or need to cancel or reschedule your appointment, please contact [facility]  Dept: [phone]  and follow the prompts.  Office hours are 8:00 a.m. to 4:30 p.m. Monday - Friday. Please note that voicemails left after 4:00 p.m. may not be returned until the following business day.  We are closed weekends and major holidays. You have access to a nurse at all times for urgent questions. Please call the  main number to the clinic Dept: [phone] and follow the prompts.   For any non-urgent questions, you may also contact your provider using MyChart. We now offer e-Visits for anyone 40 and older to request care online for non-urgent symptoms. For details visit [email or web address].   Also download the MyChart app! Go to the app store, search "MyChart", open the app, select [facility], and log in with your MyChart username and password.  Due to Covid, a mask is required upon entering the hospital/clinic. If you do not have a mask, one will be given to you upon arrival. For doctor visits, patients may have 1 support person aged 35 or older with them. For treatment visits, patients cannot have anyone with them due to current Covid guidelines and our immunocompromised population.

## 2021-05-30 NOTE — Progress Notes (Addendum)
[facility] PROGRESS NOTE   Diagnosis: Multiple myeloma  INTERVAL HISTORY:   [facility] was discharged from the hospital 05/27/2021.  He reports increased pain at the lower back and left chest.  The pain is not relieved with hydrocodone.  He has insomnia and feels anxious.  He slipped in his bathroom on the evening of 05/27/2021 and had a mild fall with his back hitting the side of the bathtub.  He reports there was not significant impact. He complains of dyspnea  Objective:  Vital signs in last 24 hours:  Blood pressure 111/66, pulse (!) 102, temperature 98.1 F (36.7 C), temperature source Oral, resp. rate 20, height $RemoveBe'5\' 8"'hGvOrJYiT$  (1.727 m), weight 166 lb 12.8 oz (75.7 kg), SpO2 97 %.    HEENT: Mild white coat over the tongue, no buccal thrush, white exudate of the pharynx (question secondary to Tums tablet) Resp: Lungs clear bilaterally Cardio: Regular rate and rhythm GI: No hepatosplenomegaly Vascular: No leg edema or erythema Musculoskeletal: Mild tenderness at the lower back.  Tender at the left anterior chest wall   Lab Results:  Lab Results  Component Value Date   WBC 8.7 05/27/2021   HGB 9.5 (L) 05/27/2021   HCT 26.0 (L) 05/27/2021   MCV 91.5 05/27/2021   PLT 19 (LL) 05/27/2021   NEUTROABS 7.3 05/27/2021    CMP  Lab Results  Component Value Date   NA 138 05/27/2021   K 3.6 05/27/2021   CL 110 05/27/2021   CO2 15 (L) 05/27/2021   GLUCOSE 208 (H) 05/27/2021   BUN 25 (H) 05/27/2021   CREATININE 2.04 (H) 05/27/2021   CALCIUM 6.4 (LL) 05/27/2021   PROT 5.0 (L) 05/27/2021   ALBUMIN 2.4 (L) 05/27/2021   ALBUMIN 2.4 (L) 05/27/2021   AST 13 (L) 05/27/2021   ALT 13 05/27/2021   ALKPHOS 46 05/27/2021   BILITOT 0.2 (L) 05/27/2021   GFRNONAA 39 (L) 05/27/2021    Medications: I have reviewed the patient's current medications.      Disposition: [facility] has been diagnosed with multiple myeloma.  He has completed 2 treatments with  Velcade/Decadron.  He will complete day 8 of cycle 1 today.  I will dose reduce the Decadron as this may be contributing to his anxiety. The creatinine and platelet count are improved today.  We will follow-up on the serum ionized calcium level.  He continues an oral calcium supplement. His overall clinical status appears worse compared to when he left the hospital 05/27/2021.  We will administer intravenous fluids today.  I will prescribe Percocet to take as needed for pain.  He will return for an office visit tomorrow.  The back and chest wall pain are likely related to myeloma with possible compression fractures or rib fractures.  I have a low clinical suspicion for pulmonary embolism.  The plan is to add Revlimid with cycle 2.  He will begin daratumumab 06/02/2021.  We reviewed potential toxicities associated with daratumumab including the chance of allergic reaction and respiratory side effects.  He agrees to proceed.  I discussed the case with nephrology.  They recommend continuing Tums and administering a dose of IV calcium gluconate today.  Betsy Coder, MD  05/30/2021  10:06 AM

## 2021-05-30 NOTE — Addendum Note (Signed)
Addended by: Lenox Ponds E on: 05/30/2021 12:18 PM ? ? Modules accepted: Orders ? ?

## 2021-05-30 NOTE — Progress Notes (Signed)
Patient presents for treatment. RN assessment completed along with the following: ? ?Labs/vitals reviewed - Yes, and patient to received K IV today.   Ok to treat with Scr 1.73 ?Weight within 10% of previous measurement - Yes ?Informed consent completed and reflects current therapy/intent - Yes, on date 05/30/21             ?Provider progress note reviewed - Today's provider note is not yet available. I reviewed the most recent oncology provider progress note in chart dated 05/27/21. ?Treatment/Antibody/Supportive plan reviewed - Yes, and there are no adjustments needed for today's treatment. ?S&H and other orders reviewed - Yes, patient to receive pain meds, IV K, and fluids ?Previous treatment date reviewed - Yes, and the appropriate amount of time has elapsed between treatments. ?Clinic Hand Off Received from - Merceda Elks, RN ? ?Patient to proceed with treatment.   ?

## 2021-05-30 NOTE — Progress Notes (Addendum)
Patient seen by Dr. Benay Spice today ? ?Vitals are not all within treatment parameters. Tachycardia at 102:  OK to treat. ? ?Labs reviewed by Dr. Benay Spice and are not all within treatment parameters. Creatinine 1.72 (improved) --OK to treat ?Platelet 35,000 (improved)--OK to treat ?Calcium 6.2--OK to treat. Waiting on ionized calcium result that can take up to 48 hours. ? ?Per physician team, patient is ready for treatment. Please note that modifications are being made to the treatment plan including Will received 1 liter NS today with 20 meq KCL and proceed with Velcade   ?

## 2021-05-30 NOTE — Progress Notes (Signed)
Patient ambulated in hall on room air: ?Pulse: 102-114 ?O2sat: 98-95% ?

## 2021-05-31 ENCOUNTER — Inpatient Hospital Stay: Payer: BLUE CROSS/BLUE SHIELD | Admitting: Nurse Practitioner

## 2021-05-31 ENCOUNTER — Inpatient Hospital Stay: Payer: BLUE CROSS/BLUE SHIELD

## 2021-05-31 ENCOUNTER — Encounter: Payer: Self-pay | Admitting: Nurse Practitioner

## 2021-05-31 ENCOUNTER — Telehealth: Payer: Self-pay | Admitting: Pharmacy Technician

## 2021-05-31 ENCOUNTER — Telehealth: Payer: Self-pay | Admitting: Pharmacist

## 2021-05-31 VITALS — BP 124/72 | HR 78 | Temp 98.1°F | Resp 18 | Ht 68.0 in | Wt 166.0 lb

## 2021-05-31 DIAGNOSIS — C9000 Multiple myeloma not having achieved remission: Secondary | ICD-10-CM | POA: Diagnosis not present

## 2021-05-31 MED ORDER — ACYCLOVIR 400 MG PO TABS: 400.0000 mg | ORAL_TABLET | Freq: Two times a day (BID) | ORAL | 3 refills | Status: DC

## 2021-05-31 MED ORDER — DEXAMETHASONE 4 MG PO TABS: ORAL_TABLET | ORAL | 0 refills | Status: DC

## 2021-05-31 NOTE — Telephone Encounter (Signed)
Oral Oncology Patient Advocate Encounter ?  ?Received notification from [facility] that prior authorization for Revlimid is required. ?  ?PA submitted on CoverMyMeds ?Key [id number] ?Status is pending ?  ?Oral Oncology Clinic will continue to follow. ? ?Dennison Nancy CPHT ?Specialty Pharmacy Patient Advocate ?[facility] ?Phone [phone] ?Fax [phone] ?06/01/2021 8:23 AM ? ?

## 2021-05-31 NOTE — Telephone Encounter (Signed)
Oral Oncology Pharmacist Encounter ? ?Received new prescription for Revlimid (lenalidomide) for the treatment of multiple myeloma in conjunction with bortezomib, dexamethasone, and daratumumab. Planned duration until disease control or unacceptable drug toxicity. ? ?Of note, patient has already started bortezomib and dexamethasone. He will start the daratumumab on 06/02/21. The plan is to start his lenalidomide with cycle 2 of treatment, scheduled to start on 06/13/21.  ? ?BMP from 05/30/21 assessed, Scr elevated at 1.78m/dL, CrCl ~571mmin. He will be started on a reduced dose of lenalidomide due to his renal impairment. Continue to monitor renal function. Prescription dose and frequency assessed.  ? ?Current medication list in Epic reviewed, no DDIs with lenalidomide identified. ? ?Evaluated chart and no patient barriers to medication adherence identified.  ? ?Oral Oncology Clinic will continue to follow for insurance authorization, copayment issues, initial counseling and start date. ? ?Patient agreed to treatment on 05/31/21 per MD documentation. ? ?AlDarl PikesPharmD, BCPS, BCOP, CPP ?Hematology/Oncology Clinical Pharmacist Practitioner ?[place] Oral Chemotherapy Navigation Clinic ?[phone] ?05/31/2021 12:28 PM ? ?

## 2021-05-31 NOTE — Progress Notes (Signed)
?  [facility] ?OFFICE PROGRESS NOTE ? ? ?Diagnosis: Multiple myeloma ? ?INTERVAL HISTORY:  ? ?Jordan King returns as scheduled.  He completed cycle 1 day 8 Velcade/dexamethasone 05/30/2021.  He denies nausea/vomiting.  Bowels are moving.  He in general is feeling better.  Pain is controlled with Percocet. ? ?Objective: ? ?Vital signs in last 24 hours: ? ?Blood pressure 124/72, pulse 78, temperature 98.1 ?F (36.7 ?C), temperature source Oral, resp. rate 18, height _0  (1.727 m), weight 166 lb (75.3 kg), SpO2 97 %. ?  ? ?HEENT: No thrush or ulcers. ?Resp: Lungs clear bilaterally. ?Cardio: Regular rate and rhythm. ?GI: Abdomen soft and nontender.  No hepatosplenomegaly. ?Vascular: Trace lower leg edema bilaterally.  Calves soft and nontender. ? ? ?Lab Results: ? ?Lab Results  ?Component Value Date  ? WBC 13.8 (H) 05/30/2021  ? HGB 8.8 (L) 05/30/2021  ? HCT 25.2 (L) 05/30/2021  ? MCV 90.6 05/30/2021  ? PLT 35 (L) 05/30/2021  ? NEUTROABS 11.6 (H) 05/30/2021  ? ? ?Imaging: ? ?No results found. ? ?Medications: I have reviewed the patient's current medications. ? ?Assessment/Plan: ?Multiple myeloma ?-05/22/2021-kappa free light chain 11.3, lambda free light chain 17,028 ?-25 g proteinuria, 20 g lambda light chains ?-Serum immunofixation 05/22/2021-monoclonal lambda light chains ?-Bone marrow biopsy 05/23/2021-hypercellular marrow involved by plasma cell myeloma, 80-90% plasma cells on the bone marrow biopsy, diminished iron stains ?-Cycle 1 Velcade/Decadron 05/23/2021 (Decadron given 05/22/2021) ?Renal failure secondary to #1-improved ?Hypercalcemia-status post treat with calcitonin, pamidronate 05/22/2021-resolved ?Lytic bone lesions ?Anemia ?Thrombocytopenia ?Hypocalcemia-IV calcium gluconate 05/30/2021 ? ?Disposition: Jordan King appears improved.  He will return for lab and day 11 Velcade/Decadron 06/02/2021.  He will take Decadron 20 mg p.o. on 06/06/2021.  He will return for Velcade/daratumumab//Decadron 06/13/2021 with  the plan to begin cycle 1 Revlimid that day as well.  We reviewed potential side effects associated with Revlimid including bone marrow toxicity, increased risk of thromboembolism, rash, neuropathy, diarrhea.  He agrees to proceed.  We made a referral to the bone marrow transplant program at [facility]. ? ?We will see him in follow-up on 06/13/2021.  We are available to see him sooner if needed. ? ?Patient seen with Dr. Benay Spice. ? ?Ned Card ANP/GNP-BC  ? ?05/31/2021  ?11:33 AM ? ?This was a shared visit with Ned Card.  Jordan King was interviewed and examined.  He is performance status appears much improved today.  He received intravenous fluids, Velcade/Decadron, and calcium gluconate yesterday.  He will return for a lab visit and day 11 Velcade/Decadron tomorrow. ? ?The plan is to begin Revlimid/Velcade/Decadron/daratumumab on 06/13/2021. ? ?I was present for greater than 50% of today's visit.  I performed medical decision making. ? ?Julieanne Manson, MD ? ? ? ? ? ?

## 2021-06-01 ENCOUNTER — Encounter: Payer: Self-pay | Admitting: Oncology

## 2021-06-01 ENCOUNTER — Other Ambulatory Visit (HOSPITAL_COMMUNITY): Payer: Self-pay

## 2021-06-01 ENCOUNTER — Encounter (HOSPITAL_COMMUNITY): Payer: Self-pay | Admitting: Oncology

## 2021-06-01 LAB — CALCIUM, IONIZED: Calcium, Ionized, Serum: 3.4 mg/dL — ABNORMAL LOW (ref 4.5–5.6)

## 2021-06-01 NOTE — Telephone Encounter (Signed)
Oral Oncology Patient Advocate Encounter ? ?Prior Authorization for Revlimid (lenalidomide) has been approved.   ? ?PA# [id number] ?Effective dates: 05/31/21 through 06/01/22 ? ?Patient must use [facility] per insurance. ? ?Oral Oncology Clinic will continue to follow.  ? ?Dennison Nancy CPHT ?Specialty Pharmacy Patient Advocate ?[place] ?Phone [phone] ?Fax [phone] ?06/01/2021 12:06 PM ? ?

## 2021-06-02 ENCOUNTER — Inpatient Hospital Stay: Payer: BLUE CROSS/BLUE SHIELD

## 2021-06-02 ENCOUNTER — Other Ambulatory Visit: Payer: Self-pay | Admitting: Oncology

## 2021-06-02 ENCOUNTER — Other Ambulatory Visit: Payer: Self-pay

## 2021-06-02 DIAGNOSIS — C9000 Multiple myeloma not having achieved remission: Secondary | ICD-10-CM | POA: Diagnosis not present

## 2021-06-02 LAB — CBC WITH DIFFERENTIAL (CANCER CENTER ONLY)
Abs Immature Granulocytes: 0.08 10*3/uL — ABNORMAL HIGH (ref 0.00–0.07)
Basophils Absolute: 0 10*3/uL (ref 0.0–0.1)
Basophils Relative: 0 %
Eosinophils Absolute: 0 10*3/uL (ref 0.0–0.5)
Eosinophils Relative: 0 %
HCT: 24.8 % — ABNORMAL LOW (ref 39.0–52.0)
Hemoglobin: 8.4 g/dL — ABNORMAL LOW (ref 13.0–17.0)
Immature Granulocytes: 1 %
Lymphocytes Relative: 7 %
Lymphs Abs: 0.9 10*3/uL (ref 0.7–4.0)
MCH: 31.3 pg (ref 26.0–34.0)
MCHC: 33.9 g/dL (ref 30.0–36.0)
MCV: 92.5 fL (ref 80.0–100.0)
Monocytes Absolute: 0.8 10*3/uL (ref 0.1–1.0)
Monocytes Relative: 7 %
Neutro Abs: 10.4 10*3/uL — ABNORMAL HIGH (ref 1.7–7.7)
Neutrophils Relative %: 85 %
Platelet Count: 43 10*3/uL — ABNORMAL LOW (ref 150–400)
RBC: 2.68 MIL/uL — ABNORMAL LOW (ref 4.22–5.81)
RDW: 17 % — ABNORMAL HIGH (ref 11.5–15.5)
WBC Count: 12.2 10*3/uL — ABNORMAL HIGH (ref 4.0–10.5)
nRBC: 0 % (ref 0.0–0.2)

## 2021-06-02 LAB — CMP (CANCER CENTER ONLY)
ALT: 27 U/L (ref 0–44)
AST: 15 U/L (ref 15–41)
Albumin: 3.2 g/dL — ABNORMAL LOW (ref 3.5–5.0)
Alkaline Phosphatase: 73 U/L (ref 38–126)
Anion gap: 9 (ref 5–15)
BUN: 21 mg/dL — ABNORMAL HIGH (ref 6–20)
CO2: 21 mmol/L — ABNORMAL LOW (ref 22–32)
Calcium: 6.5 mg/dL — ABNORMAL LOW (ref 8.9–10.3)
Chloride: 105 mmol/L (ref 98–111)
Creatinine: 1.65 mg/dL — ABNORMAL HIGH (ref 0.61–1.24)
GFR, Estimated: 51 mL/min — ABNORMAL LOW (ref >60)
Glucose, Bld: 119 mg/dL — ABNORMAL HIGH (ref 70–99)
Potassium: 4 mmol/L (ref 3.5–5.1)
Sodium: 135 mmol/L (ref 135–145)
Total Bilirubin: 0.5 mg/dL (ref 0.3–1.2)
Total Protein: 5.8 g/dL — ABNORMAL LOW (ref 6.5–8.1)

## 2021-06-02 MED ORDER — DEXAMETHASONE 4 MG PO TABS
20.0000 mg | ORAL_TABLET | Freq: Once | ORAL | Status: AC
  Administered 2021-06-02: 20 mg via ORAL
  Filled 2021-06-02: qty 5

## 2021-06-02 MED ORDER — PROCHLORPERAZINE MALEATE 10 MG PO TABS
10.0000 mg | ORAL_TABLET | Freq: Once | ORAL | Status: AC
  Administered 2021-06-02: 10 mg via ORAL
  Filled 2021-06-02: qty 1

## 2021-06-02 MED ORDER — BORTEZOMIB CHEMO SQ INJECTION 3.5 MG (2.5MG/ML)
1.0000 mg/m2 | Freq: Once | INTRAMUSCULAR | Status: AC
  Administered 2021-06-02: 2 mg via SUBCUTANEOUS
  Filled 2021-06-02: qty 0.8

## 2021-06-02 MED ORDER — SODIUM CHLORIDE 0.9 % IV SOLN
2.0000 g | Freq: Once | INTRAVENOUS | Status: AC
  Administered 2021-06-02: 2 g via INTRAVENOUS
  Filled 2021-06-02: qty 20

## 2021-06-02 MED ORDER — SODIUM CHLORIDE 0.9 % IV SOLN: Freq: Once | INTRAVENOUS | Status: AC

## 2021-06-02 NOTE — Patient Instructions (Addendum)
Jordan King   ?Discharge Instructions: ?Thank you for choosing [facility] to provide your oncology and hematology care.  ? ?If you have a lab appointment with the [facility], please go directly to the [facility] and check in at the registration area. ?  ?Wear comfortable clothing and clothing appropriate for easy access to any Portacath or PICC line.  ? ?We strive to give you quality time with your provider. You may need to reschedule your appointment if you arrive late (15 or more minutes).  Arriving late affects you and other patients whose appointments are after yours.  Also, if you miss three or more appointments without notifying the office, you may be dismissed from the clinic at the provider?s discretion.    ?  ?For prescription refill requests, have your pharmacy contact our office and allow 72 hours for refills to be completed.   ? ?Continue taking your Tums. You need to begin taking Aspirin 61m daily starting today. Please return on Monday to have your labs drawn.  ?Today you received the following chemotherapy and/or immunotherapy agents: Velcade ?You also received Calcium gluconate for you low Calcium level.  ? ?Bortezomib injection ?What is this medication? ?BORTEZOMIB (bor TEZ oh mib) targets proteins in cancer cells and stops the cancer cells from growing. It treats multiple myeloma and mantle cell lymphoma. ?This medicine may be used for other purposes; ask your health care provider or pharmacist if you have questions. ?COMMON BRAND NAME(S): Velcade ?What should I tell my care team before I take this medication? ?They need to know if you have any of these conditions: ?dehydration ?diabetes (high blood sugar) ?heart disease ?liver disease ?tingling of the fingers or toes or other nerve disorder ?an unusual or allergic reaction to bortezomib, mannitol, boron, other medicines, foods, dyes, or preservatives ?pregnant or trying to get  pregnant ?breast-feeding ?How should I use this medication? ?This medicine is injected into a vein or under the skin. It is given by a health care provider in a hospital or clinic setting. ?Talk to your health care provider about the use of this medicine in children. Special care may be needed. ?Overdosage: If you think you have taken too much of this medicine contact a poison control center or emergency room at once. ?NOTE: This medicine is only for you. Do not share this medicine with others. ?What if I miss a dose? ?Keep appointments for follow-up doses. It is important not to miss your dose. Call your health care provider if you are unable to keep an appointment. ?What may interact with this medication? ?This medicine may interact with the following medications: ?ketoconazole ?rifampin ?This list may not describe all possible interactions. Give your health care provider a list of all the medicines, herbs, non-prescription drugs, or dietary supplements you use. Also tell them if you smoke, drink alcohol, or use illegal drugs. Some items may interact with your medicine. ?What should I watch for while using this medication? ?Your condition will be monitored carefully while you are receiving this medicine. ?You may need blood work done while you are taking this medicine. ?You may get drowsy or dizzy. Do not drive, use machinery, or do anything that needs mental alertness until you know how this medicine affects you. Do not stand up or sit up quickly, especially if you are an older patient. This reduces the risk of dizzy or fainting spells ?This medicine may increase your risk of getting an infection. Call your health  care provider for advice if you get a fever, chills, sore throat, or other symptoms of a cold or flu. Do not treat yourself. Try to avoid being around people who are sick. ?Check with your health care provider if you have severe diarrhea, nausea, and vomiting, or if you sweat a lot. The loss of too much  body fluid may make it dangerous for you to take this medicine. ?Do not become pregnant while taking this medicine or for 7 months after stopping it. Women should inform their health care provider if they wish to become pregnant or think they might be pregnant. Men should not father a child while taking this medicine and for 4 months after stopping it. There is a potential for serious harm to an unborn child. Talk to your health care provider for more information. Do not breast-feed an infant while taking this medicine or for 2 months after stopping it. ?This medicine may make it more difficult to get pregnant or father a child. Talk to your health care provider if you are concerned about your fertility. ?What side effects may I notice from receiving this medication? ?Side effects that you should report to your doctor or health care professional as soon as possible: ?allergic reactions (skin rash; itching or hives; swelling of the face, lips, or tongue) ?bleeding (bloody or black, tarry stools; red or dark brown urine; spitting up blood or brown material that looks like coffee grounds; red spots on the skin; unusual bruising or bleeding from the eye, gums, or nose) ?blurred vision or changes in vision ?confusion ?constipation ?headache ?heart failure (trouble breathing; fast, irregular heartbeat; sudden weight gain; swelling of the ankles, feet, hands) ?infection (fever, chills, cough, sore throat, pain or trouble passing urine) ?lack or loss of appetite ?liver injury (dark yellow or brown urine; general ill feeling or flu-like symptoms; loss of appetite, right upper belly pain; yellowing of the eyes or skin) ?low blood pressure (dizziness; feeling faint or lightheaded, falls; unusually weak or tired) ?muscle cramps ?pain, redness, or irritation at site where injected ?pain, tingling, numbness in the hands or feet ?seizures ?trouble breathing ?unusual bruising or bleeding ?Side effects that usually do not require  medical attention (report to your doctor or health care professional if they continue or are bothersome): ?diarrhea ?nausea ?stomach pain ?trouble sleeping ?vomiting ?This list may not describe all possible side effects. Call your doctor for medical advice about side effects. You may report side effects to FDA at 1-800-FDA-1088. ?Where should I keep my medication? ?This medicine is given in a hospital or clinic. It will not be stored at home. ?NOTE: This sheet is a summary. It may not cover all possible information. If you have questions about this medicine, talk to your doctor, pharmacist, or health care provider. ?? 2022 Elsevier/Gold Standard (2020-02-26 00:00:00) ? ?  ?To help prevent nausea and vomiting after your treatment, we encourage you to take your nausea medication as directed. ? ?BELOW ARE SYMPTOMS THAT SHOULD BE REPORTED IMMEDIATELY: ?*FEVER GREATER THAN 100.4 F (38 ?C) OR HIGHER ?*CHILLS OR SWEATING ?*NAUSEA AND VOMITING THAT IS NOT CONTROLLED WITH YOUR NAUSEA MEDICATION ?*UNUSUAL SHORTNESS OF BREATH ?*UNUSUAL BRUISING OR BLEEDING ?*URINARY PROBLEMS (pain or burning when urinating, or frequent urination) ?*BOWEL PROBLEMS (unusual diarrhea, constipation, pain near the anus) ?TENDERNESS IN MOUTH AND THROAT WITH OR WITHOUT PRESENCE OF ULCERS (sore throat, sores in mouth, or a toothache) ?UNUSUAL RASH, SWELLING OR PAIN  ?UNUSUAL VAGINAL DISCHARGE OR ITCHING  ? ?Items with * indicate  a potential emergency and should be followed up as soon as possible or go to the Emergency Department if any problems should occur. ? ?Please show the CHEMOTHERAPY ALERT CARD or IMMUNOTHERAPY ALERT CARD at check-in to the Emergency Department and triage nurse. ? ?Should you have questions after your visit or need to cancel or reschedule your appointment, please contact [facility]  Dept: [phone]  and follow the prompts.  Office hours are 8:00 a.m. to 4:30 p.m. Monday - Friday. Please note that  voicemails left after 4:00 p.m. may not be returned until the following business day.  We are closed weekends and major holidays. You have access to a nurse at all times for urgent questions. Please call the main number to

## 2021-06-02 NOTE — Progress Notes (Signed)
Patient seen last week by Ned Card NP. ? ?Labs reviewed by Ned Card NP and are not all within treatment parameters. Platelets 43,000 - okay to treat. Calcium  6.5 - Okay to treat per Ned Card NP. Dr. Benay Spice ordered Calcium gluconate 2 grams IV to be administered today. Labs (CBC diff/CMP) to be drawn on Monday 06/06/21. Patient needs to begin taking Aspirin '81mg'$  daily. Patient's platelets, calcium, and kidney function improved from last week per Lattie Haw NP.  ? ?Per physician team, patient is ready for treatment and there are NO modifications to the treatment plan.  ?

## 2021-06-02 NOTE — Progress Notes (Signed)
Patient presents for treatment. RN assessment completed along with the following: ? ?Labs/vitals reviewed - Yes, and Kidneys, Calcium and platelets improved per Lattie Haw. Ok to treat with lts 43    ?Weight within 10% of previous measurement - Yes ?Informed consent completed and reflects current therapy/intent - Yes, on date 05/30/21             ?Provider progress note reviewed - Patient not seen by provider today. Most recent note dated 05/31/21 reviewed. ?Treatment/Antibody/Supportive plan reviewed - Yes, and there are no adjustments needed for today's treatment. ?S&H and other orders reviewed - Yes, and there are no additional orders identified. ?Previous treatment date reviewed - Yes, and the appropriate amount of time has elapsed between treatments. ?Clinic Hand Off Received from - Stasia Cavalier, RN ? ? ?Patient to proceed with treatment.  ? ?

## 2021-06-03 ENCOUNTER — Telehealth: Payer: Self-pay

## 2021-06-03 NOTE — Telephone Encounter (Addendum)
Patient request lab result and MD progress note to be fax over to his PCP Bettey Mare # [phone]. Fax was success ?

## 2021-06-06 ENCOUNTER — Inpatient Hospital Stay: Payer: BLUE CROSS/BLUE SHIELD

## 2021-06-06 ENCOUNTER — Telehealth: Payer: Self-pay | Admitting: *Deleted

## 2021-06-06 NOTE — Telephone Encounter (Addendum)
Jordan King unable to come to [facility] for labs due to transportation. PCP office reports they "do not do outside labs". Was given phone # of [facility] in [place], which is 10 minutes from patient's home town. Left message w/[facility] requesting fax # to send orders. ?@ [phone] Orders faxed to [facility]. Notified patient and he will go this afternoon. He confirms he already has his Revlimid on hand and will not start until he is instructed. Has started his 81 mg aspirin today. ?

## 2021-06-07 ENCOUNTER — Other Ambulatory Visit (HOSPITAL_COMMUNITY): Payer: Self-pay

## 2021-06-07 ENCOUNTER — Other Ambulatory Visit: Payer: Self-pay | Admitting: *Deleted

## 2021-06-07 ENCOUNTER — Telehealth: Payer: Self-pay | Admitting: *Deleted

## 2021-06-07 ENCOUNTER — Encounter: Payer: Self-pay | Admitting: *Deleted

## 2021-06-07 ENCOUNTER — Encounter (HOSPITAL_COMMUNITY): Payer: Self-pay | Admitting: Emergency Medicine

## 2021-06-07 MED ORDER — LENALIDOMIDE 10 MG PO CAPS: 10.0000 mg | ORAL_CAPSULE | Freq: Every day | ORAL | 0 refills | Status: DC

## 2021-06-07 NOTE — Telephone Encounter (Signed)
CMP results reviewed by MD with  improvement in creatinine and Ca+--OK to order Revlimid at 10 mg qd x21 days on, 7 off. CBC/Diff is still pending--was told by [facility] that it takes 24 hours unless ordered stat and that is 4 hours. ?Patient notified and script sent to [facility]. Instructed him to call office when received and he will be told when to begin. Informed him we will call him when CBC is received. ?Emailed his FMLA forms to [email or web address]. His mom will pick up when ready. ?

## 2021-06-07 NOTE — Progress Notes (Signed)
Referral order, demographics, chart information w/pathology sent to [facility]. Fax [phone]. Called by Wells Guiles that referral received. He will be scheduled with Dr. Particia Nearing or Dr. Cherylann Ratel. ?

## 2021-06-07 NOTE — Progress Notes (Signed)
Pts FMLA paperwork completed and sent back to [employer] for signatures.  The daughter will pick up from [employer]. ?

## 2021-06-08 ENCOUNTER — Encounter: Payer: Self-pay | Admitting: *Deleted

## 2021-06-08 ENCOUNTER — Telehealth: Payer: Self-pay

## 2021-06-08 NOTE — Progress Notes (Signed)
Completed FMLA forms emailed to his case Freight forwarder at [email or web address] due to fax [phone] not working after three attempts. Received confirmation emal that it was received. ?Employee ID #[id number] ?Case #[id number] ?Phone # [phone] ?Will provide patient copy of forms at next visit and copy sent to HIM to scan. ?Patient is aware. ?

## 2021-06-08 NOTE — Telephone Encounter (Signed)
Joelene Millin from [facility] called to updated staff on the patient. She stated the navigator received the referral and she is going to called the patient when the insurance get pre-authorization. ?

## 2021-06-08 NOTE — Progress Notes (Signed)
Call to [facility] again and his cbc/diff is still not available. Doing a smear for manual diff and was told it could take another day for results. Will f/u tomorrow. ?Patient updated. ?

## 2021-06-09 ENCOUNTER — Encounter: Payer: Self-pay | Admitting: *Deleted

## 2021-06-09 NOTE — Telephone Encounter (Signed)
Oral Chemotherapy Pharmacist Encounter ? ?Mr. Ayars's Revlimid will be delivered on tomorrow 06/10/21. ? ?Patient Education ?I spoke with patient for overview of new oral chemotherapy medication: Revlimid (lenalidomide) for the treatment of multiple myeloma in conjunction with bortezomib, dexamethasone, and daratumumab. Planned duration until disease control or unacceptable drug toxicity.  ? ?Counseled patient on administration, dosing, side effects, monitoring, drug-food interactions, safe handling, storage, and disposal. ?Patient will take 1 capsule (10 mg total) by mouth daily. Take for 21 days on, then hold for 7 days. Repeat every 28 days. ? ?Patient is taking aspirin 6m daily and I instructed him to begin taking his acyclovir prescription. ? ?Side effects include but not limited to: diarrhea, constipation, rash, decreased wbc/hgb/plt, fatigue ?Diarrhea: he reports having loperamide on hand ot use as needed ?Constipation: patient has Miralax to use as needed ?Rash: he knows to call and report any rash that occurs once lenalidomide is started ? ?Reviewed with patient importance of keeping a medication schedule and plan for any missed doses. ? ?After discussion with patient no patient barriers to medication adherence identified.  ? ?Mr. DDalgleishvoiced understanding and appreciation. All questions answered. Medication handout provided. ? ?Provided patient with Oral CEnsign Clinicphone number. Patient knows to call the office with questions or concerns. Oral Chemotherapy Navigation Clinic will continue to follow. ? ?ADarl Pikes PharmD, BCPS, BCOP, CPP ?Hematology/Oncology Clinical Pharmacist Practitioner ?[place]/[facility] Oral Chemotherapy Navigation Clinic ?[phone]? ?06/09/2021 5:21 PM  ?

## 2021-06-09 NOTE — Progress Notes (Signed)
Per [facility], the CBC/Diff has not been completed yet--in process. Should have results by tomorrow at latest. ? ?

## 2021-06-10 ENCOUNTER — Telehealth: Payer: Self-pay | Admitting: *Deleted

## 2021-06-10 NOTE — Telephone Encounter (Signed)
CBC/diff results came in from [facility] and reviewed by Dr. Benay Spice. Improved from prior testing. Informed Mr. Burdette to hold Revlimid till he sees Dr. Benay Spice on Monday and also hold dexamethasone since he will receive 20 mg here on 3/27 w/Velcade. Needs to begin acyclovir now (he started it last night). He reports a rash on his back w/some itching--could be related to heating pad or velcade. He will use topical hydrocortisone cream and take oral Benadryl for relief. MD will examine him on Monday. ?

## 2021-06-11 ENCOUNTER — Other Ambulatory Visit: Payer: Self-pay | Admitting: Oncology

## 2021-06-13 ENCOUNTER — Inpatient Hospital Stay: Payer: BLUE CROSS/BLUE SHIELD

## 2021-06-13 ENCOUNTER — Encounter: Payer: Self-pay | Admitting: *Deleted

## 2021-06-13 ENCOUNTER — Inpatient Hospital Stay: Payer: BLUE CROSS/BLUE SHIELD | Admitting: Nurse Practitioner

## 2021-06-13 ENCOUNTER — Other Ambulatory Visit: Payer: Self-pay

## 2021-06-13 ENCOUNTER — Encounter: Payer: Self-pay | Admitting: Nurse Practitioner

## 2021-06-13 VITALS — BP 117/82 | HR 88 | Temp 97.7°F | Resp 18 | Ht 68.0 in | Wt 156.8 lb

## 2021-06-13 DIAGNOSIS — C9000 Multiple myeloma not having achieved remission: Secondary | ICD-10-CM

## 2021-06-13 LAB — CBC WITH DIFFERENTIAL (CANCER CENTER ONLY)
Abs Immature Granulocytes: 0.04 10*3/uL (ref 0.00–0.07)
Basophils Absolute: 0.1 10*3/uL (ref 0.0–0.1)
Basophils Relative: 1 %
Eosinophils Absolute: 0.6 10*3/uL — ABNORMAL HIGH (ref 0.0–0.5)
Eosinophils Relative: 7 %
HCT: 28.2 % — ABNORMAL LOW (ref 39.0–52.0)
Hemoglobin: 9.6 g/dL — ABNORMAL LOW (ref 13.0–17.0)
Immature Granulocytes: 0 %
Lymphocytes Relative: 15 %
Lymphs Abs: 1.3 10*3/uL (ref 0.7–4.0)
MCH: 32 pg (ref 26.0–34.0)
MCHC: 34 g/dL (ref 30.0–36.0)
MCV: 94 fL (ref 80.0–100.0)
Monocytes Absolute: 0.9 10*3/uL (ref 0.1–1.0)
Monocytes Relative: 9 %
Neutro Abs: 6.3 10*3/uL (ref 1.7–7.7)
Neutrophils Relative %: 68 %
Platelet Count: 312 10*3/uL (ref 150–400)
RBC: 3 MIL/uL — ABNORMAL LOW (ref 4.22–5.81)
RDW: 15.5 % (ref 11.5–15.5)
WBC Count: 9.2 10*3/uL (ref 4.0–10.5)
nRBC: 0 % (ref 0.0–0.2)

## 2021-06-13 LAB — CMP (CANCER CENTER ONLY)
ALT: 30 U/L (ref 0–44)
AST: 11 U/L — ABNORMAL LOW (ref 15–41)
Albumin: 3.9 g/dL (ref 3.5–5.0)
Alkaline Phosphatase: 104 U/L (ref 38–126)
Anion gap: 12 (ref 5–15)
BUN: 27 mg/dL — ABNORMAL HIGH (ref 6–20)
CO2: 25 mmol/L (ref 22–32)
Calcium: 9.6 mg/dL (ref 8.9–10.3)
Chloride: 100 mmol/L (ref 98–111)
Creatinine: 2.03 mg/dL — ABNORMAL HIGH (ref 0.61–1.24)
GFR, Estimated: 39 mL/min — ABNORMAL LOW (ref >60)
Glucose, Bld: 119 mg/dL — ABNORMAL HIGH (ref 70–99)
Potassium: 3.9 mmol/L (ref 3.5–5.1)
Sodium: 137 mmol/L (ref 135–145)
Total Bilirubin: 0.3 mg/dL (ref 0.3–1.2)
Total Protein: 6.7 g/dL (ref 6.5–8.1)

## 2021-06-13 MED ORDER — PROCHLORPERAZINE MALEATE 10 MG PO TABS
10.0000 mg | ORAL_TABLET | Freq: Once | ORAL | Status: AC
  Administered 2021-06-13: 10 mg via ORAL
  Filled 2021-06-13: qty 1

## 2021-06-13 MED ORDER — MONTELUKAST SODIUM 10 MG PO TABS
10.0000 mg | ORAL_TABLET | Freq: Once | ORAL | Status: AC
  Administered 2021-06-13: 10 mg via ORAL
  Filled 2021-06-13: qty 1

## 2021-06-13 MED ORDER — DIPHENHYDRAMINE HCL 25 MG PO CAPS
50.0000 mg | ORAL_CAPSULE | Freq: Once | ORAL | Status: AC
  Administered 2021-06-13: 50 mg via ORAL
  Filled 2021-06-13: qty 2

## 2021-06-13 MED ORDER — BORTEZOMIB CHEMO SQ INJECTION 3.5 MG (2.5MG/ML)
1.0000 mg/m2 | Freq: Once | INTRAMUSCULAR | Status: AC
  Administered 2021-06-13: 2 mg via SUBCUTANEOUS
  Filled 2021-06-13: qty 0.8

## 2021-06-13 MED ORDER — ACETAMINOPHEN 325 MG PO TABS
650.0000 mg | ORAL_TABLET | Freq: Once | ORAL | Status: AC
  Administered 2021-06-13: 650 mg via ORAL
  Filled 2021-06-13: qty 2

## 2021-06-13 MED ORDER — DEXAMETHASONE 4 MG PO TABS
20.0000 mg | ORAL_TABLET | Freq: Once | ORAL | Status: AC
  Administered 2021-06-13: 20 mg via ORAL
  Filled 2021-06-13: qty 5

## 2021-06-13 MED ORDER — DARATUMUMAB-HYALURONIDASE-FIHJ 1800-30000 MG-UT/15ML ~~LOC~~ SOLN
1800.0000 mg | Freq: Once | SUBCUTANEOUS | Status: AC
  Administered 2021-06-13: 1800 mg via SUBCUTANEOUS
  Filled 2021-06-13: qty 15

## 2021-06-13 NOTE — Progress Notes (Signed)
Pt observed 2 hours post first time Daratumumab -hyaluronidase-fihj injection. Pt complained of itchiness at the site right after injection but that resolved shortly.  Pt had no sign of reaction. Reports feeling good. ?

## 2021-06-13 NOTE — Progress Notes (Signed)
Patient presents for treatment. RN assessment completed along with the following: ? ?Labs/vitals reviewed - Yes, and Cr. 2.03 OK to treat.    ?Weight within 10% of previous measurement - Yes weight loss of 10 lbs noted. This is due to fluid loss ?Informed consent completed and reflects current therapy/intent - Yes, on date 05/30/21             ?Provider progress note reviewed -  ?Treatment/Antibody/Supportive plan reviewYes, and there are no adjustments needed for today's treatment.ed -  ?S&H and other orders reviewed - Yes, and there are no additional orders identified. ?Previous treatment date reviewed - Yes, and the appropriate amount of time has elapsed between treatments. ?Clinic Hand Off Received from - Cristy Friedlander, RN ? ?Patient to proceed with treatment.  ? ?

## 2021-06-13 NOTE — Patient Instructions (Signed)
[facility]  ? Discharge Instructions: ?Thank you for choosing [facility] to provide your oncology and hematology care.  ? ?If you have a lab appointment with the [facility], please go directly to the [facility] and check in at the registration area. ?  ?Wear comfortable clothing and clothing appropriate for easy access to any Portacath or PICC line.  ? ?We strive to give you quality time with your provider. You may need to reschedule your appointment if you arrive late (15 or more minutes).  Arriving late affects you and other patients whose appointments are after yours.  Also, if you miss three or more appointments without notifying the office, you may be dismissed from the clinic at the provider?s discretion.    ?  ?For prescription refill requests, have your pharmacy contact our office and allow 72 hours for refills to be completed.   ? ?Today you received the following chemotherapy and/or immunotherapy agents Velcade, Darzalex Faspro    ?  ?To help prevent nausea and vomiting after your treatment, we encourage you to take your nausea medication as directed. ? ?BELOW ARE SYMPTOMS THAT SHOULD BE REPORTED IMMEDIATELY: ?*FEVER GREATER THAN 100.4 F (38 ?C) OR HIGHER ?*CHILLS OR SWEATING ?*NAUSEA AND VOMITING THAT IS NOT CONTROLLED WITH YOUR NAUSEA MEDICATION ?*UNUSUAL SHORTNESS OF BREATH ?*UNUSUAL BRUISING OR BLEEDING ?*URINARY PROBLEMS (pain or burning when urinating, or frequent urination) ?*BOWEL PROBLEMS (unusual diarrhea, constipation, pain near the anus) ?TENDERNESS IN MOUTH AND THROAT WITH OR WITHOUT PRESENCE OF ULCERS (sore throat, sores in mouth, or a toothache) ?UNUSUAL RASH, SWELLING OR PAIN  ?UNUSUAL VAGINAL DISCHARGE OR ITCHING  ? ?Items with * indicate a potential emergency and should be followed up as soon as possible or go to the Emergency Department if any problems should occur. ? ?Please show the CHEMOTHERAPY ALERT CARD or IMMUNOTHERAPY ALERT CARD at  check-in to the Emergency Department and triage nurse. ? ?Should you have questions after your visit or need to cancel or reschedule your appointment, please contact [facility]  Dept: [phone]  and follow the prompts.  Office hours are 8:00 a.m. to 4:30 p.m. Monday - Friday. Please note that voicemails left after 4:00 p.m. may not be returned until the following business day.  We are closed weekends and major holidays. You have access to a nurse at all times for urgent questions. Please call the main number to the clinic Dept: [phone] and follow the prompts. ? ? ?For any non-urgent questions, you may also contact your provider using MyChart. We now offer e-Visits for anyone 95 and older to request care online for non-urgent symptoms. For details visit [email or web address]. ?  ?Also download the MyChart app! Go to the app store, search "MyChart", open the app, select [facility], and log in with your MyChart username and password. ? ?Due to Covid, a mask is required upon entering the hospital/clinic. If you do not have a mask, one will be given to you upon arrival. For doctor visits, patients may have 1 support person aged 34 or older with them. For treatment visits, patients cannot have anyone with them due to current Covid guidelines and our immunocompromised population.  ? ?Bortezomib injection ?What is this medication? ?BORTEZOMIB (bor TEZ oh mib) targets proteins in cancer cells and stops the cancer cells from growing. It treats multiple myeloma and mantle cell lymphoma. ?This medicine may be used for other purposes; ask your health care provider or pharmacist  if you have questions. ?COMMON BRAND NAME(S): Velcade ?What should I tell my care team before I take this medication? ?They need to know if you have any of these conditions: ?dehydration ?diabetes (high blood sugar) ?heart disease ?liver disease ?tingling of the fingers or toes or other nerve disorder ?an unusual or  allergic reaction to bortezomib, mannitol, boron, other medicines, foods, dyes, or preservatives ?pregnant or trying to get pregnant ?breast-feeding ?How should I use this medication? ?This medicine is injected into a vein or under the skin. It is given by a health care provider in a hospital or clinic setting. ?Talk to your health care provider about the use of this medicine in children. Special care may be needed. ?Overdosage: If you think you have taken too much of this medicine contact a poison control center or emergency room at once. ?NOTE: This medicine is only for you. Do not share this medicine with others. ?What if I miss a dose? ?Keep appointments for follow-up doses. It is important not to miss your dose. Call your health care provider if you are unable to keep an appointment. ?What may interact with this medication? ?This medicine may interact with the following medications: ?ketoconazole ?rifampin ?This list may not describe all possible interactions. Give your health care provider a list of all the medicines, herbs, non-prescription drugs, or dietary supplements you use. Also tell them if you smoke, drink alcohol, or use illegal drugs. Some items may interact with your medicine. ?What should I watch for while using this medication? ?Your condition will be monitored carefully while you are receiving this medicine. ?You may need blood work done while you are taking this medicine. ?You may get drowsy or dizzy. Do not drive, use machinery, or do anything that needs mental alertness until you know how this medicine affects you. Do not stand up or sit up quickly, especially if you are an older patient. This reduces the risk of dizzy or fainting spells ?This medicine may increase your risk of getting an infection. Call your health care provider for advice if you get a fever, chills, sore throat, or other symptoms of a cold or flu. Do not treat yourself. Try to avoid being around people who are sick. ?Check  with your health care provider if you have severe diarrhea, nausea, and vomiting, or if you sweat a lot. The loss of too much body fluid may make it dangerous for you to take this medicine. ?Do not become pregnant while taking this medicine or for 7 months after stopping it. Women should inform their health care provider if they wish to become pregnant or think they might be pregnant. Men should not father a child while taking this medicine and for 4 months after stopping it. There is a potential for serious harm to an unborn child. Talk to your health care provider for more information. Do not breast-feed an infant while taking this medicine or for 2 months after stopping it. ?This medicine may make it more difficult to get pregnant or father a child. Talk to your health care provider if you are concerned about your fertility. ?What side effects may I notice from receiving this medication? ?Side effects that you should report to your doctor or health care professional as soon as possible: ?allergic reactions (skin rash; itching or hives; swelling of the face, lips, or tongue) ?bleeding (bloody or black, tarry stools; red or dark brown urine; spitting up blood or brown material that looks like coffee grounds; red spots on  the skin; unusual bruising or bleeding from the eye, gums, or nose) ?blurred vision or changes in vision ?confusion ?constipation ?headache ?heart failure (trouble breathing; fast, irregular heartbeat; sudden weight gain; swelling of the ankles, feet, hands) ?infection (fever, chills, cough, sore throat, pain or trouble passing urine) ?lack or loss of appetite ?liver injury (dark yellow or brown urine; general ill feeling or flu-like symptoms; loss of appetite, right upper belly pain; yellowing of the eyes or skin) ?low blood pressure (dizziness; feeling faint or lightheaded, falls; unusually weak or tired) ?muscle cramps ?pain, redness, or irritation at site where injected ?pain, tingling,  numbness in the hands or feet ?seizures ?trouble breathing ?unusual bruising or bleeding ?Side effects that usually do not require medical attention (report to your doctor or health care professional if they

## 2021-06-13 NOTE — Progress Notes (Signed)
Patient seen by Ned Card NP today ? ?Vitals are within treatment parameters. ?*Has lost ~ 10 lb since last visit--was due to fluid loss ?Labs reviewed by Ned Card NP and are not all within treatment parameters. Creatinine 2.03 --OK to treat. ? ?Per physician team, patient is ready for treatment and there are NO modifications to the treatment plan.  ?[name] was given print out on [employer] today. ?

## 2021-06-13 NOTE — Progress Notes (Signed)
?  Jordan King ?OFFICE PROGRESS NOTE ? ? ?Diagnosis: Multiple myeloma ? ?INTERVAL HISTORY:  ? ?Jordan King returns as scheduled.  He completed day 11 Velcade/Decadron 06/02/2021.  He states that he feels "great".  Pain is better.  He recently noticed a rash on his back, abdomen and upper legs.  The rash was present before he started acyclovir and aspirin. ? ?Objective: ? ?Vital signs in last 24 hours: ? ?Blood pressure 117/82, pulse 88, temperature 97.7 ?F (36.5 ?C), temperature source Oral, resp. rate 18, height _0  (1.727 m), weight 156 lb 12.8 oz (71.1 kg), SpO2 98 %. ?  ? ?HEENT: No thrush or ulcers. ?Resp: Lungs clear bilaterally. ?Cardio: Regular rate and rhythm. ?GI: Abdomen soft and nontender.  No hepatosplenomegaly. ?Vascular: No leg edema. ?Skin: Faint flat erythematous rash upper inner arm, waistband, upper legs.  Faint rash present over the lower back. ? ? ?Lab Results: ? ?Lab Results  ?Component Value Date  ? WBC 9.2 06/13/2021  ? HGB 9.6 (L) 06/13/2021  ? HCT 28.2 (L) 06/13/2021  ? MCV 94.0 06/13/2021  ? PLT 312 06/13/2021  ? NEUTROABS 6.3 06/13/2021  ? ? ?Imaging: ? ?No results found. ? ?Medications: I have reviewed the patient's current medications. ? ?Assessment/Plan: ?Multiple myeloma ?-05/22/2021-kappa free light chain 11.3, lambda free light chain 17,028 ?-25 g proteinuria, 20 g lambda light chains ?-Serum immunofixation 05/22/2021-monoclonal lambda light chains ?-Bone marrow biopsy 05/23/2021-hypercellular marrow involved by plasma cell myeloma, 80-90% plasma cells on the bone marrow biopsy, diminished iron stains ?-Cycle 1 Velcade/Decadron 05/23/2021 (Decadron given 05/22/2021) ?-Cycle 1 DRVd ?Renal failure secondary to #1-improved ?Hypercalcemia-status post treat with calcitonin, pamidronate 05/22/2021-resolved ?Lytic bone lesions ?Anemia ?Thrombocytopenia ?Hypocalcemia-IV calcium gluconate 05/30/2021 ? ?Disposition: Jordan King appears stable.  He completed cycle 1 Velcade/Decadron 06/02/2021.   Plan to proceed with cycle 1 DRVd today as scheduled.  We again reviewed potential toxicities.  He agrees to proceed. ? ?CBC and chemistry panel from today reviewed.  Labs adequate to proceed as above.  Platelet count is now in normal range.  Creatinine is mildly increased from 06/02/2021.  He has follow-up with nephrology later this week.  Calcium is in normal range. ? ?Etiology of the skin rash is unclear.  Possibly related to antibiotics he received in the hospital.  We will continue to monitor. ? ?He will return for lab, follow-up, Velcade/daratumumab in 1 week.  He will contact the office in the interim with any problems. ? ?Patient seen with Dr. Benay King. ? ? ?Jordan King  ? ?06/13/2021  ?10:42 AM ?This was a shared visit with Jordan King.  Jordan King completed 1 cycle of Velcade/Decadron.  His clinical status and thrombocytopenia have improved.  The creatinine is slightly higher today.  Hopefully the creatinine and myeloma markers will improve with continued systemic therapy.  He will begin treatment with daratumumab, Revlimid, Velcade, and Decadron today.  We reviewed potential toxicities associated with daratumumab and Revlimid.  He agrees to proceed. ?The etiology of the mild skin rash is unclear.  It could be related to occasions he received in the hospital earlier this month or Velcade. ? ?I was present for greater than 50% of today's visit.  I performed medical decision making. ? ?Jordan Manson, MD ? ? ? ? ? ? ?

## 2021-06-14 ENCOUNTER — Encounter (HOSPITAL_COMMUNITY): Payer: Self-pay | Admitting: Emergency Medicine

## 2021-06-14 NOTE — Progress Notes (Signed)
[employer] disability paperwork completed and faxed to [phone] (conformation # [id number]). Sent to be scanned.  Claim # [id number]. ?

## 2021-06-15 ENCOUNTER — Telehealth: Payer: Self-pay | Admitting: Emergency Medicine

## 2021-06-15 NOTE — Telephone Encounter (Signed)
Revlimid delivered 06/10/21. ?

## 2021-06-15 NOTE — Telephone Encounter (Signed)
24 Hour Callback ?24 hour callback post 1st time Darzalex Faspro. ?Pt reports he is feeling well. No sign of side effects or reaction. Pt did state that he had started his oral chemo on Tuesday evening and noted some queasiness that has passed now. Pt told to monitor and if it gets worse to call.  Pt had no questions. ?

## 2021-06-18 ENCOUNTER — Other Ambulatory Visit: Payer: Self-pay | Admitting: Oncology

## 2021-06-20 ENCOUNTER — Encounter: Payer: Self-pay | Admitting: *Deleted

## 2021-06-20 ENCOUNTER — Telehealth: Payer: Self-pay | Admitting: *Deleted

## 2021-06-20 ENCOUNTER — Inpatient Hospital Stay: Payer: BLUE CROSS/BLUE SHIELD

## 2021-06-20 ENCOUNTER — Inpatient Hospital Stay: Payer: BLUE CROSS/BLUE SHIELD | Attending: Oncology

## 2021-06-20 ENCOUNTER — Encounter: Payer: Self-pay | Admitting: Nurse Practitioner

## 2021-06-20 ENCOUNTER — Inpatient Hospital Stay: Payer: BLUE CROSS/BLUE SHIELD | Admitting: Nurse Practitioner

## 2021-06-20 VITALS — BP 108/71 | HR 100 | Temp 98.2°F | Resp 18 | Ht 68.0 in | Wt 159.6 lb

## 2021-06-20 DIAGNOSIS — D696 Thrombocytopenia, unspecified: Secondary | ICD-10-CM | POA: Insufficient documentation

## 2021-06-20 DIAGNOSIS — Z79899 Other long term (current) drug therapy: Secondary | ICD-10-CM | POA: Insufficient documentation

## 2021-06-20 DIAGNOSIS — C9000 Multiple myeloma not having achieved remission: Secondary | ICD-10-CM

## 2021-06-20 DIAGNOSIS — Z5112 Encounter for antineoplastic immunotherapy: Secondary | ICD-10-CM | POA: Diagnosis present

## 2021-06-20 DIAGNOSIS — N19 Unspecified kidney failure: Secondary | ICD-10-CM | POA: Diagnosis not present

## 2021-06-20 DIAGNOSIS — D649 Anemia, unspecified: Secondary | ICD-10-CM | POA: Diagnosis not present

## 2021-06-20 DIAGNOSIS — R21 Rash and other nonspecific skin eruption: Secondary | ICD-10-CM | POA: Diagnosis not present

## 2021-06-20 LAB — CMP (CANCER CENTER ONLY)
ALT: 9 U/L (ref 0–44)
AST: 7 U/L — ABNORMAL LOW (ref 15–41)
Albumin: 3.7 g/dL (ref 3.5–5.0)
Alkaline Phosphatase: 105 U/L (ref 38–126)
Anion gap: 11 (ref 5–15)
BUN: 19 mg/dL (ref 6–20)
CO2: 26 mmol/L (ref 22–32)
Calcium: 9.4 mg/dL (ref 8.9–10.3)
Chloride: 96 mmol/L — ABNORMAL LOW (ref 98–111)
Creatinine: 1.96 mg/dL — ABNORMAL HIGH (ref 0.61–1.24)
GFR, Estimated: 41 mL/min — ABNORMAL LOW (ref >60)
Glucose, Bld: 143 mg/dL — ABNORMAL HIGH (ref 70–99)
Potassium: 4.2 mmol/L (ref 3.5–5.1)
Sodium: 133 mmol/L — ABNORMAL LOW (ref 135–145)
Total Bilirubin: 0.5 mg/dL (ref 0.3–1.2)
Total Protein: 6.1 g/dL — ABNORMAL LOW (ref 6.5–8.1)

## 2021-06-20 LAB — CBC WITH DIFFERENTIAL (CANCER CENTER ONLY)
Abs Immature Granulocytes: 0.18 10*3/uL — ABNORMAL HIGH (ref 0.00–0.07)
Basophils Absolute: 0.1 10*3/uL (ref 0.0–0.1)
Basophils Relative: 0 %
Eosinophils Absolute: 2 10*3/uL — ABNORMAL HIGH (ref 0.0–0.5)
Eosinophils Relative: 9 %
HCT: 29.9 % — ABNORMAL LOW (ref 39.0–52.0)
Hemoglobin: 10.3 g/dL — ABNORMAL LOW (ref 13.0–17.0)
Immature Granulocytes: 1 %
Lymphocytes Relative: 3 %
Lymphs Abs: 0.6 10*3/uL — ABNORMAL LOW (ref 0.7–4.0)
MCH: 32.2 pg (ref 26.0–34.0)
MCHC: 34.4 g/dL (ref 30.0–36.0)
MCV: 93.4 fL (ref 80.0–100.0)
Monocytes Absolute: 0.8 10*3/uL (ref 0.1–1.0)
Monocytes Relative: 3 %
Neutro Abs: 19.3 10*3/uL — ABNORMAL HIGH (ref 1.7–7.7)
Neutrophils Relative %: 84 %
Platelet Count: 197 10*3/uL (ref 150–400)
RBC: 3.2 MIL/uL — ABNORMAL LOW (ref 4.22–5.81)
RDW: 16 % — ABNORMAL HIGH (ref 11.5–15.5)
WBC Count: 22.9 10*3/uL — ABNORMAL HIGH (ref 4.0–10.5)
nRBC: 0 % (ref 0.0–0.2)

## 2021-06-20 MED ORDER — OXYCODONE-ACETAMINOPHEN 5-325 MG PO TABS: 1.0000 | ORAL_TABLET | Freq: Four times a day (QID) | ORAL | 0 refills | Status: DC | PRN

## 2021-06-20 MED ORDER — PROCHLORPERAZINE MALEATE 10 MG PO TABS: 10.0000 mg | ORAL_TABLET | Freq: Four times a day (QID) | ORAL | 0 refills | Status: DC | PRN

## 2021-06-20 MED ORDER — DEXAMETHASONE 4 MG PO TABS: ORAL_TABLET | ORAL | 0 refills | Status: DC

## 2021-06-20 MED ORDER — LORAZEPAM 0.5 MG PO TABS: 0.5000 mg | ORAL_TABLET | Freq: Three times a day (TID) | ORAL | 0 refills | Status: DC | PRN

## 2021-06-20 NOTE — Progress Notes (Signed)
?  Jordan King ?OFFICE PROGRESS NOTE ? ? ?Diagnosis: Multiple myeloma ? ?INTERVAL HISTORY:  ? ?Jordan King returns as scheduled.  He began cycle 1 DRVd beginning 06/13/2021.  He reports the skin rash has worsened.  The rash is now present in a more generalized distribution, pruritic.  He is finding it difficult to sleep.  He feels anxious.  He is having nausea.  No diarrhea.  He does not feel he can proceed with treatment today. ? ?Objective: ? ?Vital signs in last 24 hours: ? ?Blood pressure 108/71, pulse 100, temperature 98.2 ?F (36.8 ?C), temperature source Oral, resp. rate 18, height $RemoveBe'5\' 8"'BcoPCnAHt$  (1.727 m), weight 159 lb 9.6 oz (72.4 kg), SpO2 97 %. ?  ? ?HEENT: No thrush or ulcers. ?Resp: Lungs clear bilaterally. ?Cardio: Regular rate and rhythm. ?GI: Abdomen soft and nontender.  No hepatosplenomegaly. ?Vascular: No leg edema. ?Skin: Erythematous rash trunk and extremities. ? ? ?Lab Results: ? ?Lab Results  ?Component Value Date  ? WBC 9.2 06/13/2021  ? HGB 9.6 (L) 06/13/2021  ? HCT 28.2 (L) 06/13/2021  ? MCV 94.0 06/13/2021  ? PLT 312 06/13/2021  ? NEUTROABS 6.3 06/13/2021  ? ? ?Imaging: ? ?No results found. ? ?Medications: I have reviewed the patient's current medications. ? ?Assessment/Plan: ?Multiple myeloma ?-05/22/2021-kappa free light chain 11.3, lambda free light chain 17,028 ?-25 g proteinuria, 20 g lambda light chains ?-Serum immunofixation 05/22/2021-monoclonal lambda light chains ?-Bone marrow biopsy 05/23/2021-hypercellular marrow involved by plasma cell myeloma, 80-90% plasma cells on the bone marrow biopsy, diminished iron stains ?-Cycle 1 Velcade/Decadron 05/23/2021 (Decadron given 05/22/2021) ?-Cycle 1 DRVd 06/13/2021, treatment held 06/20/2021 due to a rash ?Renal failure secondary to #1-improved ?Hypercalcemia-status post treat with calcitonin, pamidronate 05/22/2021-resolved ?Lytic bone lesions ?Anemia ?Thrombocytopenia ?Hypocalcemia-IV calcium gluconate 05/30/2021 ?Rash 06/20/2021, appears consistent with  a drug rash ? ?Disposition: Jordan King appears stable.  He began cycle 1 day DRVd on 06/13/2021.  He had a mild skin rash at that time that appeared to be resolving.  The rash has worsened over the past 4 to 5 days.  The distribution/appearance of the rash is consistent with a drug rash.  The rash may be related to Velcade.  We discussed proceeding with Revlimid and daratumumab.  He declines treatment today.  He will discontinue acyclovir.  He will take Decadron 20 mg today and on 06/23/2021. ? ?He will return for lab, follow-up, treatment on 06/28/2021.  He will contact the office in the interim with any problems. ? ?Patient seen with Dr. Benay Spice. ? ?Ned Card ANP/GNP-BC  ? ?06/20/2021  ?11:40 AM ?This was a shared visit with Ned Card.  Jordan King was interviewed and examined.  He has developed a pruritic maculopapular rash.  The rash appears consistent with a drug rash.  I suspect the rash is related to Velcade, though it is possible the rash last week was related to antibiotics and he has a new rash from one of his other medications.  I recommended proceeding with Revlimid and daratumumab.  He declines.  Treatment will be held this week.  He will discontinue acyclovir. ? ?He will continue Decadron twice this week.  The renal function has stabilized. ? ?I was present for greater than 50% of today's visit.  I performed medical decision making. ? ?Julieanne Manson, MD ? ? ? ? ? ? ?

## 2021-06-20 NOTE — Progress Notes (Signed)
Patient seen by Ned Card NP today ? ?Vitals are within treatment parameters. ? ?Labs reviewed by Ned Card NP and are not all within treatment parameters. Creatinine 1.96 ? ?Per physician team, patient will not be receiving treatment today.  ?Patient has requested to skip treatment this week. ?

## 2021-06-20 NOTE — Telephone Encounter (Signed)
Provided case manager, Izora Gala, RN with [employer] update on current treatment plan and consult with Dr. Feliciana Rossetti at [facility] on 4/18. ?

## 2021-06-21 ENCOUNTER — Telehealth: Payer: Self-pay

## 2021-06-21 ENCOUNTER — Encounter: Payer: Self-pay | Admitting: Oncology

## 2021-06-21 LAB — KAPPA/LAMBDA LIGHT CHAINS
Kappa free light chain: 8 mg/L (ref 3.3–19.4)
Kappa, lambda light chain ratio: 0.01 — ABNORMAL LOW (ref 0.26–1.65)
Lambda free light chains: 1238 mg/L — ABNORMAL HIGH (ref 5.7–26.3)

## 2021-06-21 NOTE — Telephone Encounter (Signed)
Pt called in to request copy of pathology report when he was first diagnosed with Multiple Myeloma. Pt informed he can pick up report when he comes for his next visit. Report printed out for pick up. ?

## 2021-06-27 ENCOUNTER — Inpatient Hospital Stay: Payer: BLUE CROSS/BLUE SHIELD

## 2021-06-28 ENCOUNTER — Encounter: Payer: Self-pay | Admitting: *Deleted

## 2021-06-28 ENCOUNTER — Inpatient Hospital Stay: Payer: BLUE CROSS/BLUE SHIELD

## 2021-06-28 ENCOUNTER — Inpatient Hospital Stay: Payer: BLUE CROSS/BLUE SHIELD | Admitting: Oncology

## 2021-06-28 VITALS — BP 126/78 | HR 78 | Temp 97.8°F | Resp 18 | Wt 157.6 lb

## 2021-06-28 DIAGNOSIS — C9000 Multiple myeloma not having achieved remission: Secondary | ICD-10-CM

## 2021-06-28 LAB — CMP (CANCER CENTER ONLY)
ALT: 14 U/L (ref 0–44)
AST: 8 U/L — ABNORMAL LOW (ref 15–41)
Albumin: 4 g/dL (ref 3.5–5.0)
Alkaline Phosphatase: 86 U/L (ref 38–126)
Anion gap: 10 (ref 5–15)
BUN: 24 mg/dL — ABNORMAL HIGH (ref 6–20)
CO2: 24 mmol/L (ref 22–32)
Calcium: 9.2 mg/dL (ref 8.9–10.3)
Chloride: 103 mmol/L (ref 98–111)
Creatinine: 1.56 mg/dL — ABNORMAL HIGH (ref 0.61–1.24)
GFR, Estimated: 54 mL/min — ABNORMAL LOW (ref >60)
Glucose, Bld: 101 mg/dL — ABNORMAL HIGH (ref 70–99)
Potassium: 4 mmol/L (ref 3.5–5.1)
Sodium: 137 mmol/L (ref 135–145)
Total Bilirubin: 0.3 mg/dL (ref 0.3–1.2)
Total Protein: 6.3 g/dL — ABNORMAL LOW (ref 6.5–8.1)

## 2021-06-28 LAB — CBC WITH DIFFERENTIAL (CANCER CENTER ONLY)
Abs Immature Granulocytes: 0.04 10*3/uL (ref 0.00–0.07)
Basophils Absolute: 0.1 10*3/uL (ref 0.0–0.1)
Basophils Relative: 1 %
Eosinophils Absolute: 1.6 10*3/uL — ABNORMAL HIGH (ref 0.0–0.5)
Eosinophils Relative: 11 %
HCT: 27.9 % — ABNORMAL LOW (ref 39.0–52.0)
Hemoglobin: 9.3 g/dL — ABNORMAL LOW (ref 13.0–17.0)
Immature Granulocytes: 0 %
Lymphocytes Relative: 14 %
Lymphs Abs: 2 10*3/uL (ref 0.7–4.0)
MCH: 31.6 pg (ref 26.0–34.0)
MCHC: 33.3 g/dL (ref 30.0–36.0)
MCV: 94.9 fL (ref 80.0–100.0)
Monocytes Absolute: 1.9 10*3/uL — ABNORMAL HIGH (ref 0.1–1.0)
Monocytes Relative: 13 %
Neutro Abs: 9 10*3/uL — ABNORMAL HIGH (ref 1.7–7.7)
Neutrophils Relative %: 61 %
Platelet Count: 365 10*3/uL (ref 150–400)
RBC: 2.94 MIL/uL — ABNORMAL LOW (ref 4.22–5.81)
RDW: 16.1 % — ABNORMAL HIGH (ref 11.5–15.5)
WBC Count: 14.6 10*3/uL — ABNORMAL HIGH (ref 4.0–10.5)
nRBC: 0 % (ref 0.0–0.2)

## 2021-06-28 NOTE — Progress Notes (Signed)
?  [facility] ?OFFICE PROGRESS NOTE ? ? ?Diagnosis: Multiple myeloma ? ?INTERVAL HISTORY:  ? ?Jordan King returns as scheduled.  He took Decadron last week.  The rash has resolved.  He has developed dry flaking of skin over the trunk.  He continues to have pruritus. ?He feels the rash may have been related to acyclovir.  The rash was present prior to receiving Revlimid or daratumumab.  He had a rash and pruritus.  No respiratory symptoms. ?He continues to have low back pain.  Good appetite. ? ?Objective: ? ?Vital signs in last 24 hours: ? ?Blood pressure 126/78, pulse 78, temperature 97.8 ?F (36.6 ?C), temperature source Oral, resp. rate 18, weight 157 lb 9.6 oz (71.5 kg), SpO2 100 %. ?  ? ?HEENT: No thrush ?Resp: Lungs clear bilaterally ?Cardio: Regular rate and rhythm ?GI: No hepatosplenomegaly ?Vascular: No leg edema  ?Skin: Superficial dry desquamation at the flank, low abdomen, and groin ? ?Lab Results: ? ?Lab Results  ?Component Value Date  ? WBC 14.6 (H) 06/28/2021  ? HGB 9.3 (L) 06/28/2021  ? HCT 27.9 (L) 06/28/2021  ? MCV 94.9 06/28/2021  ? PLT 365 06/28/2021  ? NEUTROABS 9.0 (H) 06/28/2021  ? ? ?CMP  ?Lab Results  ?Component Value Date  ? NA 137 06/28/2021  ? K 4.0 06/28/2021  ? CL 103 06/28/2021  ? CO2 24 06/28/2021  ? GLUCOSE 101 (H) 06/28/2021  ? BUN 24 (H) 06/28/2021  ? CREATININE 1.56 (H) 06/28/2021  ? CALCIUM 9.2 06/28/2021  ? PROT 6.3 (L) 06/28/2021  ? ALBUMIN 4.0 06/28/2021  ? AST 8 (L) 06/28/2021  ? ALT 14 06/28/2021  ? ALKPHOS 86 06/28/2021  ? BILITOT 0.3 06/28/2021  ? GFRNONAA 54 (L) 06/28/2021  ? ? ? ?Medications: I have reviewed the patient's current medications. ? ? ?Assessment/Plan: ?Multiple myeloma ?-05/22/2021-kappa free light chain 11.3, lambda free light chain 17,028 ?-25 g proteinuria, 20 g lambda light chains ?-Serum immunofixation 05/22/2021-monoclonal lambda light chains ?-Bone marrow biopsy 05/23/2021-hypercellular marrow involved by plasma cell myeloma, 80-90% plasma cells  on the bone marrow biopsy, diminished iron stains ?-Cycle 1 Velcade/Decadron 05/23/2021 (Decadron given 05/22/2021) ?-Cycle 1 DRVd 06/13/2021, treatment held 06/20/2021 due to a rash ?Revlimid resumed 06/28/2021 ?Renal failure secondary to #1-improved ?Hypercalcemia-status post treat with calcitonin, pamidronate 05/22/2021-resolved ?Lytic bone lesions ?Anemia ?Thrombocytopenia ?Hypocalcemia-IV calcium gluconate 05/30/2021, resolved ?Rash 06/20/2021, appears consistent with a drug rash ? ? ? ?Disposition: ?Jordan King has multiple myeloma.  His clinical status, thrombocytopenia, and renal failure have improved following systemic therapy.  He developed a rash over the past few weeks.  He feels the rash may have been related to acyclovir.  The rash could also be secondary to Velcade or antibiotics he received while hospitalized.  He acknowledges the rash predated Revlimid and daratumumab.  He does not feel comfortable resuming both Revlimid and daratumumab today. ?He will resume Revlimid today.  He will take Decadron today. ? ?Jordan King will return for an office visit with a plan to resume daratumumab in 1 week.  He will remain off of acyclovir. ? ?He will call for a recurrent rash or new symptoms. ? ?Betsy Coder, MD ? ?06/28/2021  ?12:11 PM ? ? ?

## 2021-06-28 NOTE — Progress Notes (Signed)
Patient seen by Dr. Benay Spice today ? ?Vitals are within treatment parameters. ? ? ?Per physician team, patient will not be receiving treatment today. He will resume Revlimid. ?

## 2021-06-30 ENCOUNTER — Other Ambulatory Visit: Payer: Self-pay | Admitting: Oncology

## 2021-06-30 DIAGNOSIS — C9000 Multiple myeloma not having achieved remission: Secondary | ICD-10-CM

## 2021-06-30 NOTE — Telephone Encounter (Signed)
Awaiting return call to report how many pills he has on hand due to prior cycle was interrrupted. ?

## 2021-07-03 ENCOUNTER — Other Ambulatory Visit: Payer: Self-pay | Admitting: Oncology

## 2021-07-04 ENCOUNTER — Inpatient Hospital Stay: Payer: BLUE CROSS/BLUE SHIELD

## 2021-07-04 ENCOUNTER — Other Ambulatory Visit: Payer: Self-pay | Admitting: Nurse Practitioner

## 2021-07-04 ENCOUNTER — Encounter: Payer: Self-pay | Admitting: Oncology

## 2021-07-04 ENCOUNTER — Other Ambulatory Visit: Payer: Self-pay | Admitting: *Deleted

## 2021-07-04 ENCOUNTER — Encounter: Payer: Self-pay | Admitting: *Deleted

## 2021-07-04 ENCOUNTER — Inpatient Hospital Stay: Payer: BLUE CROSS/BLUE SHIELD | Admitting: Oncology

## 2021-07-04 VITALS — BP 130/77 | HR 85 | Temp 98.2°F | Resp 20 | Ht 68.0 in | Wt 158.8 lb

## 2021-07-04 DIAGNOSIS — C9000 Multiple myeloma not having achieved remission: Secondary | ICD-10-CM

## 2021-07-04 LAB — CBC WITH DIFFERENTIAL (CANCER CENTER ONLY)
Abs Immature Granulocytes: 0.03 10*3/uL (ref 0.00–0.07)
Basophils Absolute: 0.1 10*3/uL (ref 0.0–0.1)
Basophils Relative: 1 %
Eosinophils Absolute: 2.4 10*3/uL — ABNORMAL HIGH (ref 0.0–0.5)
Eosinophils Relative: 22 %
HCT: 29.2 % — ABNORMAL LOW (ref 39.0–52.0)
Hemoglobin: 9.9 g/dL — ABNORMAL LOW (ref 13.0–17.0)
Immature Granulocytes: 0 %
Lymphocytes Relative: 9 %
Lymphs Abs: 1 10*3/uL (ref 0.7–4.0)
MCH: 32.4 pg (ref 26.0–34.0)
MCHC: 33.9 g/dL (ref 30.0–36.0)
MCV: 95.4 fL (ref 80.0–100.0)
Monocytes Absolute: 0.5 10*3/uL (ref 0.1–1.0)
Monocytes Relative: 5 %
Neutro Abs: 7.1 10*3/uL (ref 1.7–7.7)
Neutrophils Relative %: 63 %
Platelet Count: 219 10*3/uL (ref 150–400)
RBC: 3.06 MIL/uL — ABNORMAL LOW (ref 4.22–5.81)
RDW: 16.3 % — ABNORMAL HIGH (ref 11.5–15.5)
WBC Count: 11.1 10*3/uL — ABNORMAL HIGH (ref 4.0–10.5)
nRBC: 0 % (ref 0.0–0.2)

## 2021-07-04 LAB — CMP (CANCER CENTER ONLY)
ALT: 11 U/L (ref 0–44)
AST: 8 U/L — ABNORMAL LOW (ref 15–41)
Albumin: 4.1 g/dL (ref 3.5–5.0)
Alkaline Phosphatase: 78 U/L (ref 38–126)
Anion gap: 9 (ref 5–15)
BUN: 12 mg/dL (ref 6–20)
CO2: 28 mmol/L (ref 22–32)
Calcium: 9.3 mg/dL (ref 8.9–10.3)
Chloride: 100 mmol/L (ref 98–111)
Creatinine: 1.35 mg/dL — ABNORMAL HIGH (ref 0.61–1.24)
GFR, Estimated: >60 mL/min (ref >60)
Glucose, Bld: 108 mg/dL — ABNORMAL HIGH (ref 70–99)
Potassium: 3.6 mmol/L (ref 3.5–5.1)
Sodium: 137 mmol/L (ref 135–145)
Total Bilirubin: 0.4 mg/dL (ref 0.3–1.2)
Total Protein: 6.4 g/dL — ABNORMAL LOW (ref 6.5–8.1)

## 2021-07-04 MED ORDER — LORAZEPAM 0.5 MG PO TABS: 0.5000 mg | ORAL_TABLET | Freq: Three times a day (TID) | ORAL | 0 refills | Status: DC | PRN

## 2021-07-04 MED ORDER — MONTELUKAST SODIUM 10 MG PO TABS
10.0000 mg | ORAL_TABLET | Freq: Once | ORAL | Status: AC
  Administered 2021-07-04: 10 mg via ORAL
  Filled 2021-07-04: qty 1

## 2021-07-04 MED ORDER — ACETAMINOPHEN 325 MG PO TABS
650.0000 mg | ORAL_TABLET | Freq: Once | ORAL | Status: AC
  Administered 2021-07-04: 650 mg via ORAL
  Filled 2021-07-04: qty 2

## 2021-07-04 MED ORDER — DARATUMUMAB-HYALURONIDASE-FIHJ 1800-30000 MG-UT/15ML ~~LOC~~ SOLN
1800.0000 mg | Freq: Once | SUBCUTANEOUS | Status: AC
  Administered 2021-07-04: 1800 mg via SUBCUTANEOUS
  Filled 2021-07-04: qty 15

## 2021-07-04 MED ORDER — DEXAMETHASONE 4 MG PO TABS
20.0000 mg | ORAL_TABLET | Freq: Once | ORAL | Status: AC
  Administered 2021-07-04: 20 mg via ORAL
  Filled 2021-07-04: qty 5

## 2021-07-04 MED ORDER — DIPHENHYDRAMINE HCL 25 MG PO CAPS
50.0000 mg | ORAL_CAPSULE | Freq: Once | ORAL | Status: AC
  Administered 2021-07-04: 50 mg via ORAL
  Filled 2021-07-04: qty 2

## 2021-07-04 NOTE — Patient Instructions (Signed)
[facility]  Discharge Instructions: ?Thank you for choosing [facility] to provide your oncology and hematology care.  ? ?If you have a lab appointment with the [facility], please go directly to the [facility] and check in at the registration area. ?  ?Wear comfortable clothing and clothing appropriate for easy access to any Portacath or PICC line.  ? ?We strive to give you quality time with your provider. You may need to reschedule your appointment if you arrive late (15 or more minutes).  Arriving late affects you and other patients whose appointments are after yours.  Also, if you miss three or more appointments without notifying the office, you may be dismissed from the clinic at the provider?s discretion.    ?  ?For prescription refill requests, have your pharmacy contact our office and allow 72 hours for refills to be completed.   ? ?Today you received the following chemotherapy and/or immunotherapy agents Darzalex Faspro    ?  ?To help prevent nausea and vomiting after your treatment, we encourage you to take your nausea medication as directed. ? ?BELOW ARE SYMPTOMS THAT SHOULD BE REPORTED IMMEDIATELY: ?*FEVER GREATER THAN 100.4 F (38 ?C) OR HIGHER ?*CHILLS OR SWEATING ?*NAUSEA AND VOMITING THAT IS NOT CONTROLLED WITH YOUR NAUSEA MEDICATION ?*UNUSUAL SHORTNESS OF BREATH ?*UNUSUAL BRUISING OR BLEEDING ?*URINARY PROBLEMS (pain or burning when urinating, or frequent urination) ?*BOWEL PROBLEMS (unusual diarrhea, constipation, pain near the anus) ?TENDERNESS IN MOUTH AND THROAT WITH OR WITHOUT PRESENCE OF ULCERS (sore throat, sores in mouth, or a toothache) ?UNUSUAL RASH, SWELLING OR PAIN  ?UNUSUAL VAGINAL DISCHARGE OR ITCHING  ? ?Items with * indicate a potential emergency and should be followed up as soon as possible or go to the Emergency Department if any problems should occur. ? ?Please show the CHEMOTHERAPY ALERT CARD or IMMUNOTHERAPY ALERT CARD at check-in  to the Emergency Department and triage nurse. ? ?Should you have questions after your visit or need to cancel or reschedule your appointment, please contact [facility]  Dept: [phone]  and follow the prompts.  Office hours are 8:00 a.m. to 4:30 p.m. Monday - Friday. Please note that voicemails left after 4:00 p.m. may not be returned until the following business day.  We are closed weekends and major holidays. You have access to a nurse at all times for urgent questions. Please call the main number to the clinic Dept: [phone] and follow the prompts. ? ? ?For any non-urgent questions, you may also contact your provider using MyChart. We now offer e-Visits for anyone 36 and older to request care online for non-urgent symptoms. For details visit [email or web address]. ?  ?Also download the MyChart app! Go to the app store, search "MyChart", open the app, select [facility], and log in with your MyChart username and password. ? ?Due to Covid, a mask is required upon entering the hospital/clinic. If you do not have a mask, one will be given to you upon arrival. For doctor visits, patients may have 1 support person aged 48 or older with them. For treatment visits, patients cannot have anyone with them due to current Covid guidelines and our immunocompromised population.  ? ?

## 2021-07-04 NOTE — Telephone Encounter (Signed)
Patient requesting refill on lorazepam. OK per Dr. Benay Spice for #30. ?

## 2021-07-04 NOTE — Progress Notes (Deleted)
?  [facility] ?OFFICE PROGRESS NOTE ? ? ?Diagnosis:  ? ?INTERVAL HISTORY:  ? ?*** ? ?Objective: ? ?Vital signs in last 24 hours: ? ?Blood pressure 130/77, pulse 85, temperature 98.2 ?F (36.8 ?C), temperature source Oral, resp. rate 20, height $RemoveBe'5\' 8"'UNOyFSiVg$  (1.727 m), weight 158 lb 12.8 oz (72 kg), SpO2 100 %. ?  ? ?HEENT: *** ?Lymphatics: *** ?Resp: *** ?Cardio: *** ?GI: *** ?Vascular: *** ?Neuro:***  ?Skin:***  ? ?Portacath/PICC-without erythema ? ?Lab Results: ? ?Lab Results  ?Component Value Date  ? WBC 11.1 (H) 07/04/2021  ? HGB 9.9 (L) 07/04/2021  ? HCT 29.2 (L) 07/04/2021  ? MCV 95.4 07/04/2021  ? PLT 219 07/04/2021  ? NEUTROABS 7.1 07/04/2021  ? ? ?CMP  ?Lab Results  ?Component Value Date  ? NA 137 06/28/2021  ? K 4.0 06/28/2021  ? CL 103 06/28/2021  ? CO2 24 06/28/2021  ? GLUCOSE 101 (H) 06/28/2021  ? BUN 24 (H) 06/28/2021  ? CREATININE 1.56 (H) 06/28/2021  ? CALCIUM 9.2 06/28/2021  ? PROT 6.3 (L) 06/28/2021  ? ALBUMIN 4.0 06/28/2021  ? AST 8 (L) 06/28/2021  ? ALT 14 06/28/2021  ? ALKPHOS 86 06/28/2021  ? BILITOT 0.3 06/28/2021  ? GFRNONAA 54 (L) 06/28/2021  ? ? ?No results found for: CEA1, CEA, [id number], CA125 ? ?Lab Results  ?Component Value Date  ? INR 1.1 05/22/2021  ? LABPROT 14.4 05/22/2021  ? ? ?Imaging: ? ?No results found. ? ?Medications: I have reviewed the patient's current medications. ? ? ?Assessment/Plan: ?Multiple myeloma ?-05/22/2021-kappa free light chain 11.3, lambda free light chain 17,028 ?-25 g proteinuria, 20 g lambda light chains ?-Serum immunofixation 05/22/2021-monoclonal lambda light chains ?-Bone marrow biopsy 05/23/2021-hypercellular marrow involved by plasma cell myeloma, 80-90% plasma cells on the bone marrow biopsy, diminished iron stains ?-Cycle 1 Velcade/Decadron 05/23/2021 (Decadron given 05/22/2021) ?-Cycle 1 DRVd 06/13/2021, treatment held 06/20/2021 due to a rash ?Revlimid resumed 06/28/2021 ?Renal failure secondary to #1-improved ?Hypercalcemia-status post treat with calcitonin,  pamidronate 05/22/2021-resolved ?Lytic bone lesions ?Anemia ?Thrombocytopenia ?Hypocalcemia-IV calcium gluconate 05/30/2021, resolved ?Rash 06/20/2021, appears consistent with a drug rash ? ? ? ? ?Disposition: ?*** ? ?Betsy Coder, MD ? ?07/04/2021  ?11:22 AM ? ? ?

## 2021-07-04 NOTE — Progress Notes (Signed)
Patient brought in his Advanced Directive paperwork. Copied and sent to be scanned. ?

## 2021-07-04 NOTE — Progress Notes (Signed)
Patient seen by Dr. Benay Spice today ? ?Vitals are within treatment parameters. ? ?Labs reviewed by Dr. Benay Spice and are within treatment parameters. ? ?Per physician team, patient is ready for treatment. Please note that modifications are being made to the treatment plan including Will have Darabumumab and Dexamethasone only today.   ?

## 2021-07-04 NOTE — Progress Notes (Signed)
Patient presents for treatment. RN assessment completed along with the following: ? ?Labs/vitals reviewed - Yes, and within treatment parameters.   ?Weight within 10% of previous measurement - Yes ?Informed consent completed and reflects current therapy/intent - Yes, on date 05/30/2021             ?Provider progress note reviewed - Yes, today's provider note was reviewed. ?Treatment/Antibody/Supportive plan reviewed - Yes, and there are no adjustments needed for today's treatment. ?S&H and other orders reviewed - Yes, and there are no additional orders identified. ?Previous treatment date reviewed - Yes, and the appropriate amount of time has elapsed between treatments. ?Clinic Hand Off Received from - Cristy Friedlander, RN ? ?Patient to proceed with treatment. ? ? ? ?Pt tolerated 2nd dose of darzalex faspro with no complaints. Pt stayed for 1 hour post-observation after faspro injection . ?

## 2021-07-04 NOTE — Progress Notes (Signed)
?  [facility] ?OFFICE PROGRESS NOTE ? ? ?Diagnosis: Multiple myeloma ? ?INTERVAL HISTORY:  ? ?Jordan King resumed treatment with Revlimid 06/28/2021.  No rash, nausea, or diarrhea.  He has mild pain in the lower back.  He has bilateral leg soreness.  No leg swelling. ? ?Objective: ? ?Vital signs in last 24 hours: ? ?Blood pressure 130/77, pulse 85, temperature 98.2 ?F (36.8 ?C), temperature source Oral, resp. rate 20, height $RemoveBe'5\' 8"'QfmcvOWXx$  (1.727 m), weight 158 lb 12.8 oz (72 kg), SpO2 100 %. ?  ? ?HEENT: No thrush ?Resp: Lungs clear bilaterally ?Cardio: Regular rate and rhythm ?GI: No hepatosplenomegaly ?Vascular: No leg edema  ?Skin: Residual of a previous rash at the lower abdomen and back ? ?Lab Results: ? ?Lab Results  ?Component Value Date  ? WBC 11.1 (H) 07/04/2021  ? HGB 9.9 (L) 07/04/2021  ? HCT 29.2 (L) 07/04/2021  ? MCV 95.4 07/04/2021  ? PLT 219 07/04/2021  ? NEUTROABS 7.1 07/04/2021  ? ? ?CMP  ?Lab Results  ?Component Value Date  ? NA 137 06/28/2021  ? K 4.0 06/28/2021  ? CL 103 06/28/2021  ? CO2 24 06/28/2021  ? GLUCOSE 101 (H) 06/28/2021  ? BUN 24 (H) 06/28/2021  ? CREATININE 1.56 (H) 06/28/2021  ? CALCIUM 9.2 06/28/2021  ? PROT 6.3 (L) 06/28/2021  ? ALBUMIN 4.0 06/28/2021  ? AST 8 (L) 06/28/2021  ? ALT 14 06/28/2021  ? ALKPHOS 86 06/28/2021  ? BILITOT 0.3 06/28/2021  ? GFRNONAA 54 (L) 06/28/2021  ? ? ? ?Medications: I have reviewed the patient's current medications. ? ? ?Assessment/Plan: ?Multiple myeloma ?-05/22/2021-kappa free light chain 11.3, lambda free light chain 17,028 ?-25 g proteinuria, 20 g lambda light chains ?-Serum immunofixation 05/22/2021-monoclonal lambda light chains ?-Bone marrow biopsy 05/23/2021-hypercellular marrow involved by plasma cell myeloma, 80-90% plasma cells on the bone marrow biopsy, diminished iron stains ?-Cycle 1 Velcade/Decadron 05/23/2021 (Decadron given 05/22/2021) ?-Cycle 1 DRVd 06/13/2021, treatment held 06/20/2021 due to a rash, Revlimid resumed 06/28/2021, daratumumab  resumed 07/04/2021 ?Revlimid resumed 06/28/2021 ?Renal failure secondary to #1-improved ?Hypercalcemia-status post treat with calcitonin, pamidronate 05/22/2021-resolved ?Lytic bone lesions ?Anemia ?Thrombocytopenia ?Hypocalcemia-IV calcium gluconate 05/30/2021, resolved ?Rash 06/20/2021, appears consistent with a drug rash ? ? ? ? ?Disposition: ?Jordan King appears stable.  He has tolerated Revlimid well for the past week.  The rash has not returned.  The plan is to resume daratumumab today.  He will complete the first cycle of Revlimid over the next week. ? ?He is scheduled for daratumumab on 07/11/2021.  He will be scheduled for an office visit and cycle 2 Revlimid/daratumumab/Velcade/Decadron beginning 07/18/2021. ? ?Betsy Coder, MD ? ?07/04/2021  ?11:38 AM ? ? ?

## 2021-07-06 ENCOUNTER — Other Ambulatory Visit (HOSPITAL_COMMUNITY): Payer: Self-pay

## 2021-07-09 ENCOUNTER — Other Ambulatory Visit: Payer: Self-pay | Admitting: Oncology

## 2021-07-11 ENCOUNTER — Inpatient Hospital Stay: Payer: BLUE CROSS/BLUE SHIELD

## 2021-07-11 VITALS — BP 126/80 | HR 93 | Temp 98.4°F | Resp 20 | Ht 68.0 in | Wt 160.4 lb

## 2021-07-11 DIAGNOSIS — C9000 Multiple myeloma not having achieved remission: Secondary | ICD-10-CM

## 2021-07-11 LAB — CMP (CANCER CENTER ONLY)
ALT: 11 U/L (ref 0–44)
AST: 8 U/L — ABNORMAL LOW (ref 15–41)
Albumin: 4.1 g/dL (ref 3.5–5.0)
Alkaline Phosphatase: 69 U/L (ref 38–126)
Anion gap: 9 (ref 5–15)
BUN: 19 mg/dL (ref 6–20)
CO2: 27 mmol/L (ref 22–32)
Calcium: 9.7 mg/dL (ref 8.9–10.3)
Chloride: 100 mmol/L (ref 98–111)
Creatinine: 1.34 mg/dL — ABNORMAL HIGH (ref 0.61–1.24)
GFR, Estimated: >60 mL/min (ref >60)
Glucose, Bld: 112 mg/dL — ABNORMAL HIGH (ref 70–99)
Potassium: 3.8 mmol/L (ref 3.5–5.1)
Sodium: 136 mmol/L (ref 135–145)
Total Bilirubin: 0.4 mg/dL (ref 0.3–1.2)
Total Protein: 6.6 g/dL (ref 6.5–8.1)

## 2021-07-11 LAB — CBC WITH DIFFERENTIAL (CANCER CENTER ONLY)
Abs Immature Granulocytes: 0.04 10*3/uL (ref 0.00–0.07)
Basophils Absolute: 0.1 10*3/uL (ref 0.0–0.1)
Basophils Relative: 1 %
Eosinophils Absolute: 1.7 10*3/uL — ABNORMAL HIGH (ref 0.0–0.5)
Eosinophils Relative: 14 %
HCT: 30.2 % — ABNORMAL LOW (ref 39.0–52.0)
Hemoglobin: 10.1 g/dL — ABNORMAL LOW (ref 13.0–17.0)
Immature Granulocytes: 0 %
Lymphocytes Relative: 9 %
Lymphs Abs: 1.1 10*3/uL (ref 0.7–4.0)
MCH: 32 pg (ref 26.0–34.0)
MCHC: 33.4 g/dL (ref 30.0–36.0)
MCV: 95.6 fL (ref 80.0–100.0)
Monocytes Absolute: 1.3 10*3/uL — ABNORMAL HIGH (ref 0.1–1.0)
Monocytes Relative: 11 %
Neutro Abs: 7.4 10*3/uL (ref 1.7–7.7)
Neutrophils Relative %: 65 %
Platelet Count: 231 10*3/uL (ref 150–400)
RBC: 3.16 MIL/uL — ABNORMAL LOW (ref 4.22–5.81)
RDW: 15.4 % (ref 11.5–15.5)
WBC Count: 11.5 10*3/uL — ABNORMAL HIGH (ref 4.0–10.5)
nRBC: 0 % (ref 0.0–0.2)

## 2021-07-11 MED ORDER — DEXAMETHASONE 4 MG PO TABS
20.0000 mg | ORAL_TABLET | Freq: Once | ORAL | Status: AC
  Administered 2021-07-11: 20 mg via ORAL
  Filled 2021-07-11: qty 5

## 2021-07-11 MED ORDER — ACETAMINOPHEN 325 MG PO TABS
650.0000 mg | ORAL_TABLET | Freq: Once | ORAL | Status: AC
  Administered 2021-07-11: 650 mg via ORAL
  Filled 2021-07-11: qty 2

## 2021-07-11 MED ORDER — DIPHENHYDRAMINE HCL 25 MG PO CAPS
50.0000 mg | ORAL_CAPSULE | Freq: Once | ORAL | Status: AC
  Administered 2021-07-11: 50 mg via ORAL
  Filled 2021-07-11: qty 2

## 2021-07-11 MED ORDER — MONTELUKAST SODIUM 10 MG PO TABS
10.0000 mg | ORAL_TABLET | Freq: Once | ORAL | Status: AC
  Administered 2021-07-11: 10 mg via ORAL
  Filled 2021-07-11: qty 1

## 2021-07-11 MED ORDER — DARATUMUMAB-HYALURONIDASE-FIHJ 1800-30000 MG-UT/15ML ~~LOC~~ SOLN
1800.0000 mg | Freq: Once | SUBCUTANEOUS | Status: AC
  Administered 2021-07-11: 1800 mg via SUBCUTANEOUS
  Filled 2021-07-11: qty 15

## 2021-07-11 NOTE — Progress Notes (Signed)
Per [facility] only Faspro today as pt will wait until May for Velcade. ? ?

## 2021-07-11 NOTE — Progress Notes (Signed)
Patient presents for treatment. RN assessment completed along with the following: ? ?Labs/vitals reviewed - Yes, and within treatment parameters.   ?Weight within 10% of previous measurement - Yes ?Informed consent completed and reflects current therapy/intent - Yes, on date 05/30/21             ?Provider progress note reviewed - Patient not seen by provider today. Most recent note dated 07/04/21 reviewed. ?Treatment/Antibody/Supportive plan reviewed - Yes, and Velcade held today per Dr Benay Spice note 07/04/21 ?S&H and other orders reviewed - Yes, and there are no additional orders identified. ?Previous treatment date reviewed - Yes, and the appropriate amount of time has elapsed between treatments. ?Clinic Hand Off Received from - Cristy Friedlander, RN ? ?Patient to proceed with treatment.  ? ?

## 2021-07-11 NOTE — Patient Instructions (Addendum)
[facility]   ?Discharge Instructions: ?Thank you for choosing [facility] to provide your oncology and hematology care.  ? ?If you have a lab appointment with the [facility], please go directly to the [facility] and check in at the registration area. ?  ?Wear comfortable clothing and clothing appropriate for easy access to any Portacath or PICC line.  ? ?We strive to give you quality time with your provider. You may need to reschedule your appointment if you arrive late (15 or more minutes).  Arriving late affects you and other patients whose appointments are after yours.  Also, if you miss three or more appointments without notifying the office, you may be dismissed from the clinic at the provider?s discretion.    ?  ?For prescription refill requests, have your pharmacy contact our office and allow 72 hours for refills to be completed.   ? ?Today you received the following chemotherapy and/or immunotherapy agents Darzalex Faspro    ?  ?To help prevent nausea and vomiting after your treatment, we encourage you to take your nausea medication as directed. ? ?BELOW ARE SYMPTOMS THAT SHOULD BE REPORTED IMMEDIATELY: ?*FEVER GREATER THAN 100.4 F (38 ?C) OR HIGHER ?*CHILLS OR SWEATING ?*NAUSEA AND VOMITING THAT IS NOT CONTROLLED WITH YOUR NAUSEA MEDICATION ?*UNUSUAL SHORTNESS OF BREATH ?*UNUSUAL BRUISING OR BLEEDING ?*URINARY PROBLEMS (pain or burning when urinating, or frequent urination) ?*BOWEL PROBLEMS (unusual diarrhea, constipation, pain near the anus) ?TENDERNESS IN MOUTH AND THROAT WITH OR WITHOUT PRESENCE OF ULCERS (sore throat, sores in mouth, or a toothache) ?UNUSUAL RASH, SWELLING OR PAIN  ?UNUSUAL VAGINAL DISCHARGE OR ITCHING  ? ?Items with * indicate a potential emergency and should be followed up as soon as possible or go to the Emergency Department if any problems should occur. ? ?Please show the CHEMOTHERAPY ALERT CARD or IMMUNOTHERAPY ALERT CARD at check-in  to the Emergency Department and triage nurse. ? ?Should you have questions after your visit or need to cancel or reschedule your appointment, please contact [facility]  Dept: [phone]  and follow the prompts.  Office hours are 8:00 a.m. to 4:30 p.m. Monday - Friday. Please note that voicemails left after 4:00 p.m. may not be returned until the following business day.  We are closed weekends and major holidays. You have access to a nurse at all times for urgent questions. Please call the main number to the clinic Dept: [phone] and follow the prompts. ? ? ?For any non-urgent questions, you may also contact your provider using MyChart. We now offer e-Visits for anyone 61 and older to request care online for non-urgent symptoms. For details visit [email or web address]. ?  ?Also download the MyChart app! Go to the app store, search "MyChart", open the app, select [facility], and log in with your MyChart username and password. ? ?Due to Covid, a mask is required upon entering the hospital/clinic. If you do not have a mask, one will be given to you upon arrival. For doctor visits, patients may have 1 support person aged 6 or older with them. For treatment visits, patients cannot have anyone with them due to current Covid guidelines and our immunocompromised population.  ? ?Daratumumab; Hyaluronidase Injection ?What is this medication? ?DARATUMUMAB; HYALURONIDASE (dar a toom ue mab / hye al ur ON i dase) is a monoclonal antibody. Hyaluronidase is used to improve the effects of daratumumab. It treats certain types of cancer. Some of the cancers treated are multiple  myeloma and light-chain amyloidosis. ?This medicine may be used for other purposes; ask your health care provider or pharmacist if you have questions. ?COMMON BRAND NAME(S): DARZALEX FASPRO ?What should I tell my care team before I take this medication? ?They need to know if you have any of these conditions: ?heart  disease ?infection especially a viral infection such as chickenpox, cold sores, herpes, or hepatitis B ?lung or breathing disease ?an unusual or allergic reaction to daratumumab, hyaluronidase, other medicines, foods, dyes, or preservatives ?pregnant or trying to get pregnant ?breast-feeding ?How should I use this medication? ?This medicine is for injection under the skin. It is given by a health care professional in a hospital or clinic setting. ?Talk to your pediatrician regarding the use of this medicine in children. Special care may be needed. ?Overdosage: If you think you have taken too much of this medicine contact a poison control center or emergency room at once. ?NOTE: This medicine is only for you. Do not share this medicine with others. ?What if I miss a dose? ?Keep appointments for follow-up doses as directed. It is important not to miss your dose. Call your doctor or health care professional if you are unable to keep an appointment. ?What may interact with this medication? ?Interactions have not been studied. ?This list may not describe all possible interactions. Give your health care provider a list of all the medicines, herbs, non-prescription drugs, or dietary supplements you use. Also tell them if you smoke, drink alcohol, or use illegal drugs. Some items may interact with your medicine. ?What should I watch for while using this medication? ?Your condition will be monitored carefully while you are receiving this medicine. ?This medicine can cause serious allergic reactions. To reduce your risk, your health care provider may give you other medicine to take before receiving this one. Be sure to follow the directions from your health care provider. ?This medicine can affect the results of blood tests to match your blood type. These changes can last for up to 6 months after the final dose. Your healthcare provider will do blood tests to match your blood type before you start treatment. Tell all of your  healthcare providers that you are being treated with this medicine before receiving a blood transfusion. ?This medicine can affect the results of some tests used to determine treatment response; extra tests may be needed to evaluate response. ?Do not become pregnant while taking this medicine or for 3 months after stopping it. Women should inform their health care provider if they wish to become pregnant or think they might be pregnant. There is a potential for serious side effects to an unborn child. Talk to your health care provider for more information. Do not breast-feed an infant while taking this medicine. ?What side effects may I notice from receiving this medication? ?Side effects that you should report to your care team as soon as possible: ?Allergic reactions--skin rash, itching or hives, swelling of the face, lips, or tongue ?Blood clot--chest pain, shortness of breath, pain, swelling or warmth in the leg ?Blurred vision ?Fast, irregular heartbeat ?Infection--fever, chills, cough, sore throat, pain or trouble passing urine ?Injection reactions--dizziness, fast heartbeat, feeling faint or lightheaded, falls, headache, increase in blood pressure, nausea, vomiting, or wheezing or trouble breathing with loud or whistling sounds ?Low red blood cell counts--trouble breathing, feeling faint, lightheaded or falls, unusually weak or tired ?Unusual bleeding or bruising ?Side effects that usually do not require medical attention (report these to your care team if  they continue or are bothersome): ?Back pain ?Constipation ?Diarrhea ?Pain, tingling, numbness in the hands or feet ?Pain, redness, or irritation at site where injected ?Muscle cramp or pain ?Swelling of the ankles, feet, hands ?Tiredness ?Trouble sleeping ?This list may not describe all possible side effects. Call your doctor for medical advice about side effects. You may report side effects to FDA at 1-800-FDA-1088. ?Where should I keep my  medication? ?This drug is given in a hospital or clinic and will not be stored at home. ?NOTE: This sheet is a summary. It may not cover all possible information. If you have questions about this medicine, talk to your doc

## 2021-07-17 ENCOUNTER — Other Ambulatory Visit: Payer: Self-pay | Admitting: Oncology

## 2021-07-18 ENCOUNTER — Inpatient Hospital Stay: Payer: BLUE CROSS/BLUE SHIELD

## 2021-07-18 ENCOUNTER — Encounter: Payer: Self-pay | Admitting: *Deleted

## 2021-07-18 ENCOUNTER — Inpatient Hospital Stay (HOSPITAL_BASED_OUTPATIENT_CLINIC_OR_DEPARTMENT_OTHER): Payer: BLUE CROSS/BLUE SHIELD | Admitting: Oncology

## 2021-07-18 ENCOUNTER — Inpatient Hospital Stay: Payer: BLUE CROSS/BLUE SHIELD | Attending: Oncology

## 2021-07-18 VITALS — BP 121/81 | HR 94 | Temp 98.1°F | Resp 18 | Ht 68.0 in | Wt 159.6 lb

## 2021-07-18 DIAGNOSIS — C9000 Multiple myeloma not having achieved remission: Secondary | ICD-10-CM | POA: Diagnosis present

## 2021-07-18 DIAGNOSIS — Z79899 Other long term (current) drug therapy: Secondary | ICD-10-CM | POA: Diagnosis not present

## 2021-07-18 DIAGNOSIS — D696 Thrombocytopenia, unspecified: Secondary | ICD-10-CM | POA: Insufficient documentation

## 2021-07-18 DIAGNOSIS — D649 Anemia, unspecified: Secondary | ICD-10-CM | POA: Insufficient documentation

## 2021-07-18 DIAGNOSIS — Z5112 Encounter for antineoplastic immunotherapy: Secondary | ICD-10-CM | POA: Insufficient documentation

## 2021-07-18 LAB — CBC WITH DIFFERENTIAL (CANCER CENTER ONLY)
Abs Immature Granulocytes: 0.03 10*3/uL (ref 0.00–0.07)
Basophils Absolute: 0.1 10*3/uL (ref 0.0–0.1)
Basophils Relative: 1 %
Eosinophils Absolute: 0.9 10*3/uL — ABNORMAL HIGH (ref 0.0–0.5)
Eosinophils Relative: 11 %
HCT: 28.6 % — ABNORMAL LOW (ref 39.0–52.0)
Hemoglobin: 9.7 g/dL — ABNORMAL LOW (ref 13.0–17.0)
Immature Granulocytes: 0 %
Lymphocytes Relative: 20 %
Lymphs Abs: 1.8 10*3/uL (ref 0.7–4.0)
MCH: 32.3 pg (ref 26.0–34.0)
MCHC: 33.9 g/dL (ref 30.0–36.0)
MCV: 95.3 fL (ref 80.0–100.0)
Monocytes Absolute: 0.9 10*3/uL (ref 0.1–1.0)
Monocytes Relative: 11 %
Neutro Abs: 4.9 10*3/uL (ref 1.7–7.7)
Neutrophils Relative %: 57 %
Platelet Count: 298 10*3/uL (ref 150–400)
RBC: 3 MIL/uL — ABNORMAL LOW (ref 4.22–5.81)
RDW: 15.5 % (ref 11.5–15.5)
WBC Count: 8.6 10*3/uL (ref 4.0–10.5)
nRBC: 0 % (ref 0.0–0.2)

## 2021-07-18 LAB — CMP (CANCER CENTER ONLY)
ALT: 9 U/L (ref 0–44)
AST: 8 U/L — ABNORMAL LOW (ref 15–41)
Albumin: 4 g/dL (ref 3.5–5.0)
Alkaline Phosphatase: 71 U/L (ref 38–126)
Anion gap: 14 (ref 5–15)
BUN: 17 mg/dL (ref 6–20)
CO2: 24 mmol/L (ref 22–32)
Calcium: 9.8 mg/dL (ref 8.9–10.3)
Chloride: 103 mmol/L (ref 98–111)
Creatinine: 1.37 mg/dL — ABNORMAL HIGH (ref 0.61–1.24)
GFR, Estimated: >60 mL/min (ref >60)
Glucose, Bld: 119 mg/dL — ABNORMAL HIGH (ref 70–99)
Potassium: 3.7 mmol/L (ref 3.5–5.1)
Sodium: 141 mmol/L (ref 135–145)
Total Bilirubin: 0.4 mg/dL (ref 0.3–1.2)
Total Protein: 6.4 g/dL — ABNORMAL LOW (ref 6.5–8.1)

## 2021-07-18 MED ORDER — BORTEZOMIB CHEMO SQ INJECTION 3.5 MG (2.5MG/ML)
1.0000 mg/m2 | Freq: Once | INTRAMUSCULAR | Status: AC
  Administered 2021-07-18: 2 mg via SUBCUTANEOUS
  Filled 2021-07-18: qty 0.8

## 2021-07-18 MED ORDER — DARATUMUMAB-HYALURONIDASE-FIHJ 1800-30000 MG-UT/15ML ~~LOC~~ SOLN
1800.0000 mg | Freq: Once | SUBCUTANEOUS | Status: AC
  Administered 2021-07-18: 1800 mg via SUBCUTANEOUS
  Filled 2021-07-18: qty 15

## 2021-07-18 MED ORDER — SODIUM CHLORIDE 0.9 % IV SOLN: Freq: Once | INTRAVENOUS | Status: DC

## 2021-07-18 MED ORDER — ACETAMINOPHEN 325 MG PO TABS
650.0000 mg | ORAL_TABLET | Freq: Once | ORAL | Status: AC
  Administered 2021-07-18: 650 mg via ORAL
  Filled 2021-07-18: qty 2

## 2021-07-18 MED ORDER — PROCHLORPERAZINE MALEATE 10 MG PO TABS
10.0000 mg | ORAL_TABLET | Freq: Once | ORAL | Status: AC
  Administered 2021-07-18: 10 mg via ORAL
  Filled 2021-07-18: qty 1

## 2021-07-18 MED ORDER — DEXAMETHASONE 4 MG PO TABS
20.0000 mg | ORAL_TABLET | Freq: Once | ORAL | Status: AC
  Administered 2021-07-18: 20 mg via ORAL
  Filled 2021-07-18: qty 5

## 2021-07-18 MED ORDER — DIPHENHYDRAMINE HCL 25 MG PO CAPS
50.0000 mg | ORAL_CAPSULE | Freq: Once | ORAL | Status: AC
  Administered 2021-07-18: 50 mg via ORAL
  Filled 2021-07-18: qty 2

## 2021-07-18 NOTE — Progress Notes (Signed)
?  [facility] ?OFFICE PROGRESS NOTE ? ? ?Diagnosis: Multiple myeloma ? ?INTERVAL HISTORY:  ? ?Jordan King returns as scheduled.  He completed the first cycle of Revlimid last week.  He was treated with daratumumab 07/11/2021.  No fever or rash.  He reports intermittent "hot needle "sensations at the upper back/neck and arms.  He has intermittent dizziness. ? ?Objective: ? ?Vital signs in last 24 hours: ? ?Blood pressure 121/81, pulse 94, temperature 98.1 ?F (36.7 ?C), temperature source Oral, resp. rate 18, height $RemoveBe'5\' 8"'LWjPcEPSs$  (1.727 m), weight 159 lb 9.6 oz (72.4 kg), SpO2 99 %. ?  ? ?HEENT: No thrush ?Resp: Lungs clear bilaterally ?Cardio: Regular rate and rhythm ?GI: No hepatosplenomegaly ?Vascular: No leg edema  ?Skin: Faint morbilliform erythema at the lower back.  No rash at the upper back/neck or arms ? ?Lab Results: ? ?Lab Results  ?Component Value Date  ? WBC 8.6 07/18/2021  ? HGB 9.7 (L) 07/18/2021  ? HCT 28.6 (L) 07/18/2021  ? MCV 95.3 07/18/2021  ? PLT 298 07/18/2021  ? NEUTROABS 4.9 07/18/2021  ? ? ?CMP  ?Lab Results  ?Component Value Date  ? NA 136 07/11/2021  ? K 3.8 07/11/2021  ? CL 100 07/11/2021  ? CO2 27 07/11/2021  ? GLUCOSE 112 (H) 07/11/2021  ? BUN 19 07/11/2021  ? CREATININE 1.34 (H) 07/11/2021  ? CALCIUM 9.7 07/11/2021  ? PROT 6.6 07/11/2021  ? ALBUMIN 4.1 07/11/2021  ? AST 8 (L) 07/11/2021  ? ALT 11 07/11/2021  ? ALKPHOS 69 07/11/2021  ? BILITOT 0.4 07/11/2021  ? GFRNONAA >60 07/11/2021  ? ? ?No results found for: CEA1, CEA, [id number], CA125 ? ?Lab Results  ?Component Value Date  ? INR 1.1 05/22/2021  ? LABPROT 14.4 05/22/2021  ? ? ?Imaging: ? ?No results found. ? ?Medications: I have reviewed the patient's current medications. ? ? ?Assessment/Plan: ?Multiple myeloma ?-05/22/2021-kappa free light chain 11.3, lambda free light chain 17,028 ?-25 g proteinuria, 20 g lambda light chains ?-Serum immunofixation 05/22/2021-monoclonal lambda light chains ?-Bone marrow biopsy 05/23/2021-hypercellular  marrow involved by plasma cell myeloma, 80-90% plasma cells on the bone marrow biopsy, diminished iron stains ?-Cycle 1 Velcade/Decadron 05/23/2021 (Decadron given 05/22/2021) ?-Cycle 1 DRVd 06/13/2021, treatment held 06/20/2021 due to a rash, Revlimid resumed 06/28/2021, daratumumab resumed 07/04/2021 ?Revlimid resumed 06/28/2021 ?Cycle 2 daratumumab/Revlimid/Decadron/Velcade 07/18/2021 ?Renal failure secondary to #1-improved ?Hypercalcemia-status post treat with calcitonin, pamidronate 05/22/2021-resolved ?Lytic bone lesions ?Anemia ?Thrombocytopenia ?Hypocalcemia-IV calcium gluconate 05/30/2021, resolved ?Rash 06/20/2021, appears consistent with a drug rash ? ? ? ? ?Disposition: ?Jordan King appears stable.  The plan is to begin a second cycle of Revlimid today.  He will continue weekly daratumumab.  Velcade will be resumed with treatment today. ? ?I doubt the "hot needle "sensation he has experienced over the past week is related to an allergic reaction or neuropathy. ? ?He was evaluated at [facility].  Jordan King is concerned about potential toxicities associated with stem cell therapy and is not sure whether he will agree to proceed.  He will return for an office visit in 3 weeks. ? ?Betsy Coder, MD ? ?07/18/2021  ?10:01 AM ? ? ?

## 2021-07-18 NOTE — Progress Notes (Signed)
Patient seen by Dr. Sherrill today ? ?Vitals are within treatment parameters. ? ?Labs reviewed by Dr. Sherrill and are within treatment parameters. ? ?Per physician team, patient is ready for treatment and there are NO modifications to the treatment plan.  ?

## 2021-07-19 ENCOUNTER — Telehealth: Payer: Self-pay

## 2021-07-19 ENCOUNTER — Other Ambulatory Visit: Payer: Self-pay

## 2021-07-19 LAB — KAPPA/LAMBDA LIGHT CHAINS
Kappa free light chain: 15.4 mg/L (ref 3.3–19.4)
Kappa, lambda light chain ratio: 0.12 — ABNORMAL LOW (ref 0.26–1.65)
Lambda free light chains: 131 mg/L — ABNORMAL HIGH (ref 5.7–26.3)

## 2021-07-19 MED ORDER — VALACYCLOVIR HCL 500 MG PO TABS: 500.0000 mg | ORAL_TABLET | Freq: Two times a day (BID) | ORAL | 5 refills | Status: DC

## 2021-07-19 NOTE — Telephone Encounter (Signed)
Pt. Verbalized understanding.

## 2021-07-19 NOTE — Telephone Encounter (Signed)
-----   Message from [name], MD sent at 07/19/2021  4:47 PM EDT ----- ?Please call patient, the light chains are better ? ?

## 2021-07-22 ENCOUNTER — Other Ambulatory Visit: Payer: Self-pay | Admitting: Oncology

## 2021-07-25 ENCOUNTER — Inpatient Hospital Stay: Payer: BLUE CROSS/BLUE SHIELD

## 2021-07-25 ENCOUNTER — Other Ambulatory Visit: Payer: BLUE CROSS/BLUE SHIELD

## 2021-07-25 ENCOUNTER — Other Ambulatory Visit: Payer: Self-pay | Admitting: Oncology

## 2021-07-25 ENCOUNTER — Ambulatory Visit: Payer: BLUE CROSS/BLUE SHIELD

## 2021-07-25 ENCOUNTER — Inpatient Hospital Stay (HOSPITAL_BASED_OUTPATIENT_CLINIC_OR_DEPARTMENT_OTHER): Payer: BLUE CROSS/BLUE SHIELD | Admitting: Oncology

## 2021-07-25 VITALS — BP 128/82 | HR 82 | Temp 98.7°F | Resp 18 | Ht 68.0 in | Wt 160.1 lb

## 2021-07-25 DIAGNOSIS — C9000 Multiple myeloma not having achieved remission: Secondary | ICD-10-CM

## 2021-07-25 LAB — CBC WITH DIFFERENTIAL (CANCER CENTER ONLY)
Abs Immature Granulocytes: 0.02 10*3/uL (ref 0.00–0.07)
Basophils Absolute: 0.1 10*3/uL (ref 0.0–0.1)
Basophils Relative: 1 %
Eosinophils Absolute: 0.9 10*3/uL — ABNORMAL HIGH (ref 0.0–0.5)
Eosinophils Relative: 12 %
HCT: 29.7 % — ABNORMAL LOW (ref 39.0–52.0)
Hemoglobin: 10.1 g/dL — ABNORMAL LOW (ref 13.0–17.0)
Immature Granulocytes: 0 %
Lymphocytes Relative: 12 %
Lymphs Abs: 0.9 10*3/uL (ref 0.7–4.0)
MCH: 32.6 pg (ref 26.0–34.0)
MCHC: 34 g/dL (ref 30.0–36.0)
MCV: 95.8 fL (ref 80.0–100.0)
Monocytes Absolute: 0.8 10*3/uL (ref 0.1–1.0)
Monocytes Relative: 11 %
Neutro Abs: 4.6 10*3/uL (ref 1.7–7.7)
Neutrophils Relative %: 64 %
Platelet Count: 255 10*3/uL (ref 150–400)
RBC: 3.1 MIL/uL — ABNORMAL LOW (ref 4.22–5.81)
RDW: 15.2 % (ref 11.5–15.5)
WBC Count: 7.2 10*3/uL (ref 4.0–10.5)
nRBC: 0 % (ref 0.0–0.2)

## 2021-07-25 MED ORDER — DEXAMETHASONE 4 MG PO TABS
20.0000 mg | ORAL_TABLET | Freq: Once | ORAL | Status: AC
  Administered 2021-07-25: 20 mg via ORAL
  Filled 2021-07-25: qty 5

## 2021-07-25 MED ORDER — DIPHENHYDRAMINE HCL 25 MG PO CAPS
50.0000 mg | ORAL_CAPSULE | Freq: Once | ORAL | Status: AC
  Administered 2021-07-25: 50 mg via ORAL
  Filled 2021-07-25: qty 2

## 2021-07-25 MED ORDER — ACETAMINOPHEN 325 MG PO TABS
650.0000 mg | ORAL_TABLET | Freq: Once | ORAL | Status: AC
  Administered 2021-07-25: 650 mg via ORAL
  Filled 2021-07-25: qty 2

## 2021-07-25 MED ORDER — BORTEZOMIB CHEMO SQ INJECTION 3.5 MG (2.5MG/ML)
1.0000 mg/m2 | Freq: Once | INTRAMUSCULAR | Status: AC
  Administered 2021-07-25: 2 mg via SUBCUTANEOUS
  Filled 2021-07-25: qty 0.8

## 2021-07-25 MED ORDER — PROCHLORPERAZINE MALEATE 10 MG PO TABS
10.0000 mg | ORAL_TABLET | Freq: Once | ORAL | Status: AC
  Administered 2021-07-25: 10 mg via ORAL
  Filled 2021-07-25: qty 1

## 2021-07-25 MED ORDER — DARATUMUMAB-HYALURONIDASE-FIHJ 1800-30000 MG-UT/15ML ~~LOC~~ SOLN
1800.0000 mg | Freq: Once | SUBCUTANEOUS | Status: AC
  Administered 2021-07-25: 1800 mg via SUBCUTANEOUS
  Filled 2021-07-25: qty 15

## 2021-07-25 NOTE — Patient Instructions (Signed)
[facility]   ?Discharge Instructions: ?Thank you for choosing [facility] to provide your oncology and hematology care.  ? ?If you have a lab appointment with the [facility], please go directly to the [facility] and check in at the registration area. ?  ?Wear comfortable clothing and clothing appropriate for easy access to any Portacath or PICC line.  ? ?We strive to give you quality time with your provider. You may need to reschedule your appointment if you arrive late (15 or more minutes).  Arriving late affects you and other patients whose appointments are after yours.  Also, if you miss three or more appointments without notifying the office, you may be dismissed from the clinic at the provider?s discretion.    ?  ?For prescription refill requests, have your pharmacy contact our office and allow 72 hours for refills to be completed.   ? ?Today you received the following chemotherapy and/or immunotherapy agents Bortezomib (VELCADE) & Daratumumab-hyaluronidase (DARZALEX FASPRO).    ?  ?To help prevent nausea and vomiting after your treatment, we encourage you to take your nausea medication as directed. ? ?BELOW ARE SYMPTOMS THAT SHOULD BE REPORTED IMMEDIATELY: ?*FEVER GREATER THAN 100.4 F (38 ?C) OR HIGHER ?*CHILLS OR SWEATING ?*NAUSEA AND VOMITING THAT IS NOT CONTROLLED WITH YOUR NAUSEA MEDICATION ?*UNUSUAL SHORTNESS OF BREATH ?*UNUSUAL BRUISING OR BLEEDING ?*URINARY PROBLEMS (pain or burning when urinating, or frequent urination) ?*BOWEL PROBLEMS (unusual diarrhea, constipation, pain near the anus) ?TENDERNESS IN MOUTH AND THROAT WITH OR WITHOUT PRESENCE OF ULCERS (sore throat, sores in mouth, or a toothache) ?UNUSUAL RASH, SWELLING OR PAIN  ?UNUSUAL VAGINAL DISCHARGE OR ITCHING  ? ?Items with * indicate a potential emergency and should be followed up as soon as possible or go to the Emergency Department if any problems should occur. ? ?Please show the  CHEMOTHERAPY ALERT CARD or IMMUNOTHERAPY ALERT CARD at check-in to the Emergency Department and triage nurse. ? ?Should you have questions after your visit or need to cancel or reschedule your appointment, please contact [facility]  Dept: [phone]  and follow the prompts.  Office hours are 8:00 a.m. to 4:30 p.m. Monday - Friday. Please note that voicemails left after 4:00 p.m. may not be returned until the following business day.  We are closed weekends and major holidays. You have access to a nurse at all times for urgent questions. Please call the main number to the clinic Dept: [phone] and follow the prompts. ? ? ?For any non-urgent questions, you may also contact your provider using MyChart. We now offer e-Visits for anyone 14 and older to request care online for non-urgent symptoms. For details visit [email or web address]. ?  ?Also download the MyChart app! Go to the app store, search "MyChart", open the app, select [facility], and log in with your MyChart username and password. ? ?Due to Covid, a mask is required upon entering the hospital/clinic. If you do not have a mask, one will be given to you upon arrival. For doctor visits, patients may have 1 support person aged 68 or older with them. For treatment visits, patients cannot have anyone with them due to current Covid guidelines and our immunocompromised population.  ? ?Bortezomib injection ?What is this medication? ?BORTEZOMIB (bor TEZ oh mib) targets proteins in cancer cells and stops the cancer cells from growing. It treats multiple myeloma and mantle cell lymphoma. ?This medicine may be used for other purposes; ask your health care  provider or pharmacist if you have questions. ?COMMON BRAND NAME(S): Velcade ?What should I tell my care team before I take this medication? ?They need to know if you have any of these conditions: ?dehydration ?diabetes (high blood sugar) ?heart disease ?liver disease ?tingling of the  fingers or toes or other nerve disorder ?an unusual or allergic reaction to bortezomib, mannitol, boron, other medicines, foods, dyes, or preservatives ?pregnant or trying to get pregnant ?breast-feeding ?How should I use this medication? ?This medicine is injected into a vein or under the skin. It is given by a health care provider in a hospital or clinic setting. ?Talk to your health care provider about the use of this medicine in children. Special care may be needed. ?Overdosage: If you think you have taken too much of this medicine contact a poison control center or emergency room at once. ?NOTE: This medicine is only for you. Do not share this medicine with others. ?What if I miss a dose? ?Keep appointments for follow-up doses. It is important not to miss your dose. Call your health care provider if you are unable to keep an appointment. ?What may interact with this medication? ?This medicine may interact with the following medications: ?ketoconazole ?rifampin ?This list may not describe all possible interactions. Give your health care provider a list of all the medicines, herbs, non-prescription drugs, or dietary supplements you use. Also tell them if you smoke, drink alcohol, or use illegal drugs. Some items may interact with your medicine. ?What should I watch for while using this medication? ?Your condition will be monitored carefully while you are receiving this medicine. ?You may need blood work done while you are taking this medicine. ?You may get drowsy or dizzy. Do not drive, use machinery, or do anything that needs mental alertness until you know how this medicine affects you. Do not stand up or sit up quickly, especially if you are an older patient. This reduces the risk of dizzy or fainting spells ?This medicine may increase your risk of getting an infection. Call your health care provider for advice if you get a fever, chills, sore throat, or other symptoms of a cold or flu. Do not treat yourself.  Try to avoid being around people who are sick. ?Check with your health care provider if you have severe diarrhea, nausea, and vomiting, or if you sweat a lot. The loss of too much body fluid may make it dangerous for you to take this medicine. ?Do not become pregnant while taking this medicine or for 7 months after stopping it. Women should inform their health care provider if they wish to become pregnant or think they might be pregnant. Men should not father a child while taking this medicine and for 4 months after stopping it. There is a potential for serious harm to an unborn child. Talk to your health care provider for more information. Do not breast-feed an infant while taking this medicine or for 2 months after stopping it. ?This medicine may make it more difficult to get pregnant or father a child. Talk to your health care provider if you are concerned about your fertility. ?What side effects may I notice from receiving this medication? ?Side effects that you should report to your doctor or health care professional as soon as possible: ?allergic reactions (skin rash; itching or hives; swelling of the face, lips, or tongue) ?bleeding (bloody or black, tarry stools; red or dark brown urine; spitting up blood or brown material that looks like coffee grounds;  red spots on the skin; unusual bruising or bleeding from the eye, gums, or nose) ?blurred vision or changes in vision ?confusion ?constipation ?headache ?heart failure (trouble breathing; fast, irregular heartbeat; sudden weight gain; swelling of the ankles, feet, hands) ?infection (fever, chills, cough, sore throat, pain or trouble passing urine) ?lack or loss of appetite ?liver injury (dark yellow or brown urine; general ill feeling or flu-like symptoms; loss of appetite, right upper belly pain; yellowing of the eyes or skin) ?low blood pressure (dizziness; feeling faint or lightheaded, falls; unusually weak or tired) ?muscle cramps ?pain, redness, or  irritation at site where injected ?pain, tingling, numbness in the hands or feet ?seizures ?trouble breathing ?unusual bruising or bleeding ?Side effects that usually do not require medical attention (report to your

## 2021-07-25 NOTE — Progress Notes (Signed)
?  Jordan King ?OFFICE PROGRESS NOTE ? ? ?Diagnosis: Multiple myeloma ? ?INTERVAL HISTORY:  ? ?Jordan King began another cycle of DRVD last week, Revlimid was started 5-23.  He noted the onset of a rash over the trunk and extremities beginning 07/24/2021.  There is mild associated pruritus.  He otherwise feels well.  He has not started Valtrex. ? ?Objective: ? ?Vital signs in last 24 hours: ? ?There were no vitals taken for this visit. ?  ? ?HEENT: No thrush or bleeding ?Skin: Erythematous maculopapular rash over the trunk, mild similar rash over the extremities ? ?Lab Results: ? ?Lab Results  ?Component Value Date  ? WBC 7.2 07/25/2021  ? HGB 10.1 (L) 07/25/2021  ? HCT 29.7 (L) 07/25/2021  ? MCV 95.8 07/25/2021  ? PLT 255 07/25/2021  ? NEUTROABS 4.6 07/25/2021  ? ? ?CMP  ?Lab Results  ?Component Value Date  ? NA 141 07/18/2021  ? K 3.7 07/18/2021  ? CL 103 07/18/2021  ? CO2 24 07/18/2021  ? GLUCOSE 119 (H) 07/18/2021  ? BUN 17 07/18/2021  ? CREATININE 1.37 (H) 07/18/2021  ? CALCIUM 9.8 07/18/2021  ? PROT 6.4 (L) 07/18/2021  ? ALBUMIN 4.0 07/18/2021  ? AST 8 (L) 07/18/2021  ? ALT 9 07/18/2021  ? ALKPHOS 71 07/18/2021  ? BILITOT 0.4 07/18/2021  ? GFRNONAA >60 07/18/2021  ? ? ? ?Medications: I have reviewed the patient's current medications. ? ? ?Assessment/Plan: ?Multiple myeloma ?-05/22/2021-kappa free light chain 11.3, lambda free light chain 17,028 ?-25 g proteinuria, 20 g lambda light chains ?-Serum immunofixation 05/22/2021-monoclonal lambda light chains ?-Bone marrow biopsy 05/23/2021-hypercellular marrow involved by plasma cell myeloma, 80-90% plasma cells on the bone marrow biopsy, diminished iron stains ?-Cycle 1 Velcade/Decadron 05/23/2021 (Decadron given 05/22/2021) ?-Cycle 1 DRVd 06/13/2021, treatment held 06/20/2021 due to a rash, Revlimid resumed 06/28/2021, daratumumab resumed 07/04/2021 ?Revlimid resumed 06/28/2021 ?Cycle 2 daratumumab/Revlimid/Decadron/Velcade 07/18/2021 (Revlimid started 07/19/2021, placed on  hold 07/25/2021 secondary to a rash-last taken 07/24/2021) ?Renal failure secondary to #1-improved ?Hypercalcemia-status post treat with calcitonin, pamidronate 05/22/2021-resolved ?Lytic bone lesions ?Anemia ?Thrombocytopenia ?Hypocalcemia-IV calcium gluconate 05/30/2021, resolved ?Rash 06/20/2021, appears consistent with a drug rash, recurrent rash 07/24/2021-Revlimid placed on hold ? ? ?Disposition: ?Jordan King is completing cycle 2  DRVD, he began the second cycle of Revlimid on 07/19/2021.  He developed a rash beginning yesterday.  The rash appears typical of the rash seen with lenalidomide.  The lenalidomide will be placed on hold.  He will complete another treatment with Velcade and daratumumab today. ? ?He has no associated symptoms.  He will call for new symptoms or progression of the rash.  He will begin Valtrex prophylaxis.  He will return for treatment as scheduled 08/01/2021.  He is scheduled for an office visit 08/08/2021 ? ? ?Betsy Coder, MD ? ?07/25/2021  ?2:16 PM ? ? ?

## 2021-07-25 NOTE — Progress Notes (Signed)
Patient presents for treatment. RN assessment completed along with the following: ? ?Labs/vitals reviewed - Yes, and okay to treat per Dr. Benay Spice.    ?Weight within 10% of previous measurement - Yes ?Informed consent completed and reflects current therapy/intent - Yes, on date 05/30/2021             ?Provider progress note reviewed - Patient not seen by provider today. Most recent note dated 07/18/2021 reviewed. ?Treatment/Antibody/Supportive plan reviewed - Yes, and there are no adjustments needed for today's treatment. ?S&H and other orders reviewed - Yes, and there are no additional orders identified. ?Previous treatment date reviewed - Yes, and the appropriate amount of time has elapsed between treatments. ?Clinic Hand Off Received from - No. ? ?Patient to proceed with treatment.  ? ?

## 2021-07-30 ENCOUNTER — Other Ambulatory Visit: Payer: Self-pay | Admitting: Oncology

## 2021-08-01 ENCOUNTER — Inpatient Hospital Stay: Payer: BLUE CROSS/BLUE SHIELD

## 2021-08-01 VITALS — BP 127/78 | HR 73 | Temp 97.8°F | Resp 18 | Ht 68.0 in | Wt 163.4 lb

## 2021-08-01 DIAGNOSIS — C9000 Multiple myeloma not having achieved remission: Secondary | ICD-10-CM | POA: Diagnosis not present

## 2021-08-01 LAB — CBC WITH DIFFERENTIAL (CANCER CENTER ONLY)
Abs Immature Granulocytes: 0.03 10*3/uL (ref 0.00–0.07)
Basophils Absolute: 0.2 10*3/uL — ABNORMAL HIGH (ref 0.0–0.1)
Basophils Relative: 1 %
Eosinophils Absolute: 0.4 10*3/uL (ref 0.0–0.5)
Eosinophils Relative: 4 %
HCT: 31.4 % — ABNORMAL LOW (ref 39.0–52.0)
Hemoglobin: 10.5 g/dL — ABNORMAL LOW (ref 13.0–17.0)
Immature Granulocytes: 0 %
Lymphocytes Relative: 21 %
Lymphs Abs: 2.3 10*3/uL (ref 0.7–4.0)
MCH: 32.5 pg (ref 26.0–34.0)
MCHC: 33.4 g/dL (ref 30.0–36.0)
MCV: 97.2 fL (ref 80.0–100.0)
Monocytes Absolute: 1.3 10*3/uL — ABNORMAL HIGH (ref 0.1–1.0)
Monocytes Relative: 12 %
Neutro Abs: 6.7 10*3/uL (ref 1.7–7.7)
Neutrophils Relative %: 62 %
Platelet Count: 309 10*3/uL (ref 150–400)
RBC: 3.23 MIL/uL — ABNORMAL LOW (ref 4.22–5.81)
RDW: 14.7 % (ref 11.5–15.5)
WBC Count: 10.8 10*3/uL — ABNORMAL HIGH (ref 4.0–10.5)
nRBC: 0 % (ref 0.0–0.2)

## 2021-08-01 MED ORDER — DARATUMUMAB-HYALURONIDASE-FIHJ 1800-30000 MG-UT/15ML ~~LOC~~ SOLN
1800.0000 mg | Freq: Once | SUBCUTANEOUS | Status: AC
  Administered 2021-08-01: 1800 mg via SUBCUTANEOUS
  Filled 2021-08-01: qty 15

## 2021-08-01 MED ORDER — PROCHLORPERAZINE MALEATE 10 MG PO TABS
10.0000 mg | ORAL_TABLET | Freq: Once | ORAL | Status: AC
  Administered 2021-08-01: 10 mg via ORAL
  Filled 2021-08-01: qty 1

## 2021-08-01 MED ORDER — BORTEZOMIB CHEMO SQ INJECTION 3.5 MG (2.5MG/ML)
1.0000 mg/m2 | Freq: Once | INTRAMUSCULAR | Status: AC
  Administered 2021-08-01: 2 mg via SUBCUTANEOUS
  Filled 2021-08-01: qty 0.8

## 2021-08-01 MED ORDER — DIPHENHYDRAMINE HCL 25 MG PO CAPS
50.0000 mg | ORAL_CAPSULE | Freq: Once | ORAL | Status: AC
  Administered 2021-08-01: 50 mg via ORAL
  Filled 2021-08-01: qty 2

## 2021-08-01 MED ORDER — DEXAMETHASONE 4 MG PO TABS
20.0000 mg | ORAL_TABLET | Freq: Once | ORAL | Status: AC
  Administered 2021-08-01: 20 mg via ORAL
  Filled 2021-08-01: qty 5

## 2021-08-01 MED ORDER — ACETAMINOPHEN 325 MG PO TABS
650.0000 mg | ORAL_TABLET | Freq: Once | ORAL | Status: AC
  Administered 2021-08-01: 650 mg via ORAL
  Filled 2021-08-01: qty 2

## 2021-08-01 NOTE — Patient Instructions (Signed)
[facility]  ? Discharge Instructions: ?Thank you for choosing [facility] to provide your oncology and hematology care.  ? ?If you have a lab appointment with the [facility], please go directly to the [facility] and check in at the registration area. ?  ?Wear comfortable clothing and clothing appropriate for easy access to any Portacath or PICC line.  ? ?We strive to give you quality time with your provider. You may need to reschedule your appointment if you arrive late (15 or more minutes).  Arriving late affects you and other patients whose appointments are after yours.  Also, if you miss three or more appointments without notifying the office, you may be dismissed from the clinic at the provider?s discretion.    ?  ?For prescription refill requests, have your pharmacy contact our office and allow 72 hours for refills to be completed.   ? ?Today you received the following chemotherapy and/or immunotherapy agents Velcade, Darzalex Faspro ?Bortezomib injection ?What is this medication? ?BORTEZOMIB (bor TEZ oh mib) targets proteins in cancer cells and stops the cancer cells from growing. It treats multiple myeloma and mantle cell lymphoma. ?This medicine may be used for other purposes; ask your health care provider or pharmacist if you have questions. ?COMMON BRAND NAME(S): Velcade ?What should I tell my care team before I take this medication? ?They need to know if you have any of these conditions: ?dehydration ?diabetes (high blood sugar) ?heart disease ?liver disease ?tingling of the fingers or toes or other nerve disorder ?an unusual or allergic reaction to bortezomib, mannitol, boron, other medicines, foods, dyes, or preservatives ?pregnant or trying to get pregnant ?breast-feeding ?How should I use this medication? ?This medicine is injected into a vein or under the skin. It is given by a health care provider in a hospital or clinic setting. ?Talk to your health  care provider about the use of this medicine in children. Special care may be needed. ?Overdosage: If you think you have taken too much of this medicine contact a poison control center or emergency room at once. ?NOTE: This medicine is only for you. Do not share this medicine with others. ?What if I miss a dose? ?Keep appointments for follow-up doses. It is important not to miss your dose. Call your health care provider if you are unable to keep an appointment. ?What may interact with this medication? ?This medicine may interact with the following medications: ?ketoconazole ?rifampin ?This list may not describe all possible interactions. Give your health care provider a list of all the medicines, herbs, non-prescription drugs, or dietary supplements you use. Also tell them if you smoke, drink alcohol, or use illegal drugs. Some items may interact with your medicine. ?What should I watch for while using this medication? ?Your condition will be monitored carefully while you are receiving this medicine. ?You may need blood work done while you are taking this medicine. ?You may get drowsy or dizzy. Do not drive, use machinery, or do anything that needs mental alertness until you know how this medicine affects you. Do not stand up or sit up quickly, especially if you are an older patient. This reduces the risk of dizzy or fainting spells ?This medicine may increase your risk of getting an infection. Call your health care provider for advice if you get a fever, chills, sore throat, or other symptoms of a cold or flu. Do not treat yourself. Try to avoid being around people who are sick. ?Check  with your health care provider if you have severe diarrhea, nausea, and vomiting, or if you sweat a lot. The loss of too much body fluid may make it dangerous for you to take this medicine. ?Do not become pregnant while taking this medicine or for 7 months after stopping it. Women should inform their health care provider if they wish  to become pregnant or think they might be pregnant. Men should not father a child while taking this medicine and for 4 months after stopping it. There is a potential for serious harm to an unborn child. Talk to your health care provider for more information. Do not breast-feed an infant while taking this medicine or for 2 months after stopping it. ?This medicine may make it more difficult to get pregnant or father a child. Talk to your health care provider if you are concerned about your fertility. ?What side effects may I notice from receiving this medication? ?Side effects that you should report to your doctor or health care professional as soon as possible: ?allergic reactions (skin rash; itching or hives; swelling of the face, lips, or tongue) ?bleeding (bloody or black, tarry stools; red or dark brown urine; spitting up blood or brown material that looks like coffee grounds; red spots on the skin; unusual bruising or bleeding from the eye, gums, or nose) ?blurred vision or changes in vision ?confusion ?constipation ?headache ?heart failure (trouble breathing; fast, irregular heartbeat; sudden weight gain; swelling of the ankles, feet, hands) ?infection (fever, chills, cough, sore throat, pain or trouble passing urine) ?lack or loss of appetite ?liver injury (dark yellow or brown urine; general ill feeling or flu-like symptoms; loss of appetite, right upper belly pain; yellowing of the eyes or skin) ?low blood pressure (dizziness; feeling faint or lightheaded, falls; unusually weak or tired) ?muscle cramps ?pain, redness, or irritation at site where injected ?pain, tingling, numbness in the hands or feet ?seizures ?trouble breathing ?unusual bruising or bleeding ?Side effects that usually do not require medical attention (report to your doctor or health care professional if they continue or are bothersome): ?diarrhea ?nausea ?stomach pain ?trouble sleeping ?vomiting ?This list may not describe all possible side  effects. Call your doctor for medical advice about side effects. You may report side effects to FDA at 1-800-FDA-1088. ?Where should I keep my medication? ?This medicine is given in a hospital or clinic. It will not be stored at home. ?NOTE: This sheet is a summary. It may not cover all possible information. If you have questions about this medicine, talk to your doctor, pharmacist, or health care provider. ?? 2023 Elsevier/Gold Standard (2020-02-26 00:00:00) ?    ?  ?To help prevent nausea and vomiting after your treatment, we encourage you to take your nausea medication as directed. ? ?BELOW ARE SYMPTOMS THAT SHOULD BE REPORTED IMMEDIATELY: ?*FEVER GREATER THAN 100.4 F (38 ?C) OR HIGHER ?*CHILLS OR SWEATING ?*NAUSEA AND VOMITING THAT IS NOT CONTROLLED WITH YOUR NAUSEA MEDICATION ?*UNUSUAL SHORTNESS OF BREATH ?*UNUSUAL BRUISING OR BLEEDING ?*URINARY PROBLEMS (pain or burning when urinating, or frequent urination) ?*BOWEL PROBLEMS (unusual diarrhea, constipation, pain near the anus) ?TENDERNESS IN MOUTH AND THROAT WITH OR WITHOUT PRESENCE OF ULCERS (sore throat, sores in mouth, or a toothache) ?UNUSUAL RASH, SWELLING OR PAIN  ?UNUSUAL VAGINAL DISCHARGE OR ITCHING  ? ?Items with * indicate a potential emergency and should be followed up as soon as possible or go to the Emergency Department if any problems should occur. ? ?Please show the CHEMOTHERAPY ALERT CARD or  IMMUNOTHERAPY ALERT CARD at check-in to the Emergency Department and triage nurse. ? ?Should you have questions after your visit or need to cancel or reschedule your appointment, please contact [facility]  Dept: [phone]  and follow the prompts.  Office hours are 8:00 a.m. to 4:30 p.m. Monday - Friday. Please note that voicemails left after 4:00 p.m. may not be returned until the following business day.  We are closed weekends and major holidays. You have access to a nurse at all times for urgent questions. Please call the  main number to the clinic Dept: [phone] and follow the prompts. ? ? ?For any non-urgent questions, you may also contact your provider using MyChart. We now offer e-Visits for anyone 8 and older

## 2021-08-05 ENCOUNTER — Other Ambulatory Visit: Payer: Self-pay | Admitting: Oncology

## 2021-08-08 ENCOUNTER — Inpatient Hospital Stay: Payer: BLUE CROSS/BLUE SHIELD

## 2021-08-08 ENCOUNTER — Encounter: Payer: Self-pay | Admitting: *Deleted

## 2021-08-08 ENCOUNTER — Encounter: Payer: Self-pay | Admitting: Nurse Practitioner

## 2021-08-08 ENCOUNTER — Inpatient Hospital Stay (HOSPITAL_BASED_OUTPATIENT_CLINIC_OR_DEPARTMENT_OTHER): Payer: BLUE CROSS/BLUE SHIELD | Admitting: Nurse Practitioner

## 2021-08-08 VITALS — BP 135/85 | HR 81 | Temp 98.2°F | Resp 18 | Ht 68.0 in | Wt 162.4 lb

## 2021-08-08 DIAGNOSIS — C9000 Multiple myeloma not having achieved remission: Secondary | ICD-10-CM

## 2021-08-08 LAB — CMP (CANCER CENTER ONLY)
ALT: 8 U/L (ref 0–44)
AST: 9 U/L — ABNORMAL LOW (ref 15–41)
Albumin: 4.2 g/dL (ref 3.5–5.0)
Alkaline Phosphatase: 59 U/L (ref 38–126)
Anion gap: 11 (ref 5–15)
BUN: 20 mg/dL (ref 6–20)
CO2: 26 mmol/L (ref 22–32)
Calcium: 9.9 mg/dL (ref 8.9–10.3)
Chloride: 100 mmol/L (ref 98–111)
Creatinine: 1.27 mg/dL — ABNORMAL HIGH (ref 0.61–1.24)
GFR, Estimated: >60 mL/min (ref >60)
Glucose, Bld: 107 mg/dL — ABNORMAL HIGH (ref 70–99)
Potassium: 4.1 mmol/L (ref 3.5–5.1)
Sodium: 137 mmol/L (ref 135–145)
Total Bilirubin: 0.3 mg/dL (ref 0.3–1.2)
Total Protein: 7 g/dL (ref 6.5–8.1)

## 2021-08-08 LAB — CBC WITH DIFFERENTIAL (CANCER CENTER ONLY)
Abs Immature Granulocytes: 0.02 10*3/uL (ref 0.00–0.07)
Basophils Absolute: 0.1 10*3/uL (ref 0.0–0.1)
Basophils Relative: 1 %
Eosinophils Absolute: 0.3 10*3/uL (ref 0.0–0.5)
Eosinophils Relative: 3 %
HCT: 34.1 % — ABNORMAL LOW (ref 39.0–52.0)
Hemoglobin: 11.5 g/dL — ABNORMAL LOW (ref 13.0–17.0)
Immature Granulocytes: 0 %
Lymphocytes Relative: 22 %
Lymphs Abs: 2.3 10*3/uL (ref 0.7–4.0)
MCH: 32.5 pg (ref 26.0–34.0)
MCHC: 33.7 g/dL (ref 30.0–36.0)
MCV: 96.3 fL (ref 80.0–100.0)
Monocytes Absolute: 0.9 10*3/uL (ref 0.1–1.0)
Monocytes Relative: 9 %
Neutro Abs: 6.7 10*3/uL (ref 1.7–7.7)
Neutrophils Relative %: 65 %
Platelet Count: 262 10*3/uL (ref 150–400)
RBC: 3.54 MIL/uL — ABNORMAL LOW (ref 4.22–5.81)
RDW: 15.1 % (ref 11.5–15.5)
WBC Count: 10.4 10*3/uL (ref 4.0–10.5)
nRBC: 0 % (ref 0.0–0.2)

## 2021-08-08 MED ORDER — OXYCODONE-ACETAMINOPHEN 5-325 MG PO TABS: 1.0000 | ORAL_TABLET | Freq: Four times a day (QID) | ORAL | 0 refills | Status: DC | PRN

## 2021-08-08 MED ORDER — DIPHENHYDRAMINE HCL 25 MG PO CAPS
50.0000 mg | ORAL_CAPSULE | Freq: Once | ORAL | Status: AC
  Administered 2021-08-08: 50 mg via ORAL
  Filled 2021-08-08: qty 2

## 2021-08-08 MED ORDER — LORAZEPAM 0.5 MG PO TABS: 0.5000 mg | ORAL_TABLET | Freq: Three times a day (TID) | ORAL | 0 refills | Status: DC | PRN

## 2021-08-08 MED ORDER — PANTOPRAZOLE SODIUM 20 MG PO TBEC: 20.0000 mg | DELAYED_RELEASE_TABLET | Freq: Every day | ORAL | 1 refills | Status: DC

## 2021-08-08 MED ORDER — DARATUMUMAB-HYALURONIDASE-FIHJ 1800-30000 MG-UT/15ML ~~LOC~~ SOLN
1800.0000 mg | Freq: Once | SUBCUTANEOUS | Status: AC
  Administered 2021-08-08: 1800 mg via SUBCUTANEOUS
  Filled 2021-08-08: qty 15

## 2021-08-08 MED ORDER — ACETAMINOPHEN 325 MG PO TABS
650.0000 mg | ORAL_TABLET | Freq: Once | ORAL | Status: AC
  Administered 2021-08-08: 650 mg via ORAL
  Filled 2021-08-08: qty 2

## 2021-08-08 MED ORDER — PROCHLORPERAZINE MALEATE 10 MG PO TABS
10.0000 mg | ORAL_TABLET | Freq: Once | ORAL | Status: AC
  Administered 2021-08-08: 10 mg via ORAL
  Filled 2021-08-08: qty 1

## 2021-08-08 MED ORDER — BORTEZOMIB CHEMO SQ INJECTION 3.5 MG (2.5MG/ML)
1.0000 mg/m2 | Freq: Once | INTRAMUSCULAR | Status: AC
  Administered 2021-08-08: 2 mg via SUBCUTANEOUS
  Filled 2021-08-08: qty 0.8

## 2021-08-08 MED ORDER — DEXAMETHASONE 4 MG PO TABS
20.0000 mg | ORAL_TABLET | Freq: Once | ORAL | Status: AC
  Administered 2021-08-08: 20 mg via ORAL
  Filled 2021-08-08: qty 5

## 2021-08-08 NOTE — Progress Notes (Signed)
Patient presents for treatment. RN assessment completed along with the following:  Labs/vitals reviewed - Yes, and within treatment parameters.   Weight within 10% of previous measurement - Yes Informed consent completed and reflects current therapy/intent - Yes, on date 05/30/21             Provider progress note reviewed - Yes, today's provider note was reviewed. Treatment/Antibody/Supportive plan reviewed - Yes, and there are no adjustments needed for today's treatment. S&H and other orders reviewed - Yes, and there are no additional orders identified. Previous treatment date reviewed - Yes, and the appropriate amount of time has elapsed between treatments. Clinic Hand Off Received from - Merceda Elks, RN  Patient to proceed with treatment.

## 2021-08-08 NOTE — Progress Notes (Signed)
Patient seen by Lisa Thomas NP today  Vitals are within treatment parameters.  Labs reviewed by Lisa Thomas NP and are within treatment parameters.  Per physician team, patient is ready for treatment and there are NO modifications to the treatment plan.     

## 2021-08-08 NOTE — Patient Instructions (Signed)
[facility]   Discharge Instructions: Thank you for choosing [facility] to provide your oncology and hematology care.   If you have a lab appointment with the [facility], please go directly to the [facility] and check in at the registration area.   Wear comfortable clothing and clothing appropriate for easy access to any Portacath or PICC line.   We strive to give you quality time with your provider. You may need to reschedule your appointment if you arrive late (15 or more minutes).  Arriving late affects you and other patients whose appointments are after yours.  Also, if you miss three or more appointments without notifying the office, you may be dismissed from the clinic at the provider's discretion.      For prescription refill requests, have your pharmacy contact our office and allow 72 hours for refills to be completed.    Today you received the following chemotherapy and/or immunotherapy agents Velcade, Darzalex Faspro Bortezomib injection What is this medication? BORTEZOMIB (bor TEZ oh mib) targets proteins in cancer cells and stops the cancer cells from growing. It treats multiple myeloma and mantle cell lymphoma. This medicine may be used for other purposes; ask your health care provider or pharmacist if you have questions. COMMON BRAND NAME(S): Velcade What should I tell my care team before I take this medication? They need to know if you have any of these conditions: dehydration diabetes (high blood sugar) heart disease liver disease tingling of the fingers or toes or other nerve disorder an unusual or allergic reaction to bortezomib, mannitol, boron, other medicines, foods, dyes, or preservatives pregnant or trying to get pregnant breast-feeding How should I use this medication? This medicine is injected into a vein or under the skin. It is given by a health care provider in a hospital or clinic setting. Talk to your health  care provider about the use of this medicine in children. Special care may be needed. Overdosage: If you think you have taken too much of this medicine contact a poison control center or emergency room at once. NOTE: This medicine is only for you. Do not share this medicine with others. What if I miss a dose? Keep appointments for follow-up doses. It is important not to miss your dose. Call your health care provider if you are unable to keep an appointment. What may interact with this medication? This medicine may interact with the following medications: ketoconazole rifampin This list may not describe all possible interactions. Give your health care provider a list of all the medicines, herbs, non-prescription drugs, or dietary supplements you use. Also tell them if you smoke, drink alcohol, or use illegal drugs. Some items may interact with your medicine. What should I watch for while using this medication? Your condition will be monitored carefully while you are receiving this medicine. You may need blood work done while you are taking this medicine. You may get drowsy or dizzy. Do not drive, use machinery, or do anything that needs mental alertness until you know how this medicine affects you. Do not stand up or sit up quickly, especially if you are an older patient. This reduces the risk of dizzy or fainting spells This medicine may increase your risk of getting an infection. Call your health care provider for advice if you get a fever, chills, sore throat, or other symptoms of a cold or flu. Do not treat yourself. Try to avoid being around people who are sick. Check  with your health care provider if you have severe diarrhea, nausea, and vomiting, or if you sweat a lot. The loss of too much body fluid may make it dangerous for you to take this medicine. Do not become pregnant while taking this medicine or for 7 months after stopping it. Women should inform their health care provider if they wish  to become pregnant or think they might be pregnant. Men should not father a child while taking this medicine and for 4 months after stopping it. There is a potential for serious harm to an unborn child. Talk to your health care provider for more information. Do not breast-feed an infant while taking this medicine or for 2 months after stopping it. This medicine may make it more difficult to get pregnant or father a child. Talk to your health care provider if you are concerned about your fertility. What side effects may I notice from receiving this medication? Side effects that you should report to your doctor or health care professional as soon as possible: allergic reactions (skin rash; itching or hives; swelling of the face, lips, or tongue) bleeding (bloody or black, tarry stools; red or dark brown urine; spitting up blood or brown material that looks like coffee grounds; red spots on the skin; unusual bruising or bleeding from the eye, gums, or nose) blurred vision or changes in vision confusion constipation headache heart failure (trouble breathing; fast, irregular heartbeat; sudden weight gain; swelling of the ankles, feet, hands) infection (fever, chills, cough, sore throat, pain or trouble passing urine) lack or loss of appetite liver injury (dark yellow or brown urine; general ill feeling or flu-like symptoms; loss of appetite, right upper belly pain; yellowing of the eyes or skin) low blood pressure (dizziness; feeling faint or lightheaded, falls; unusually weak or tired) muscle cramps pain, redness, or irritation at site where injected pain, tingling, numbness in the hands or feet seizures trouble breathing unusual bruising or bleeding Side effects that usually do not require medical attention (report to your doctor or health care professional if they continue or are bothersome): diarrhea nausea stomach pain trouble sleeping vomiting This list may not describe all possible side  effects. Call your doctor for medical advice about side effects. You may report side effects to FDA at 1-800-FDA-1088. Where should I keep my medication? This medicine is given in a hospital or clinic. It will not be stored at home. NOTE: This sheet is a summary. It may not cover all possible information. If you have questions about this medicine, talk to your doctor, pharmacist, or health care provider.  2023 Elsevier/Gold Standard (2020-02-26 00:00:00)       To help prevent nausea and vomiting after your treatment, we encourage you to take your nausea medication as directed.  BELOW ARE SYMPTOMS THAT SHOULD BE REPORTED IMMEDIATELY: *FEVER GREATER THAN 100.4 F (38 C) OR HIGHER *CHILLS OR SWEATING *NAUSEA AND VOMITING THAT IS NOT CONTROLLED WITH YOUR NAUSEA MEDICATION *UNUSUAL SHORTNESS OF BREATH *UNUSUAL BRUISING OR BLEEDING *URINARY PROBLEMS (pain or burning when urinating, or frequent urination) *BOWEL PROBLEMS (unusual diarrhea, constipation, pain near the anus) TENDERNESS IN MOUTH AND THROAT WITH OR WITHOUT PRESENCE OF ULCERS (sore throat, sores in mouth, or a toothache) UNUSUAL RASH, SWELLING OR PAIN  UNUSUAL VAGINAL DISCHARGE OR ITCHING   Items with * indicate a potential emergency and should be followed up as soon as possible or go to the Emergency Department if any problems should occur.  Please show the CHEMOTHERAPY ALERT CARD or  IMMUNOTHERAPY ALERT CARD at check-in to the Emergency Department and triage nurse.  Should you have questions after your visit or need to cancel or reschedule your appointment, please contact [facility]  Dept: [phone]  and follow the prompts.  Office hours are 8:00 a.m. to 4:30 p.m. Monday - Friday. Please note that voicemails left after 4:00 p.m. may not be returned until the following business day.  We are closed weekends and major holidays. You have access to a nurse at all times for urgent questions. Please call the  main number to the clinic Dept: [phone] and follow the prompts.   For any non-urgent questions, you may also contact your provider using MyChart. We now offer e-Visits for anyone 15 and older to request care online for non-urgent symptoms. For details visit [email or web address].   Also download the MyChart app! Go to the app store, search "MyChart", open the app, select [facility], and log in with your MyChart username and password.  Due to Covid, a mask is required upon entering the hospital/clinic. If you do not have a mask, one will be given to you upon arrival. For doctor visits, patients may have 1 support person aged 65 or older with them. For treatment visits, patients cannot have anyone with them due to current Covid guidelines and our immunocompromised population.   Daratumumab; Hyaluronidase Injection What is this medication? DARATUMUMAB; HYALURONIDASE (dar a toom ue mab / hye al ur ON i dase) is a monoclonal antibody. Hyaluronidase is used to improve the effects of daratumumab. It treats certain types of cancer. Some of the cancers treated are multiple myeloma and light-chain amyloidosis. This medicine may be used for other purposes; ask your health care provider or pharmacist if you have questions. COMMON BRAND NAME(S): DARZALEX FASPRO What should I tell my care team before I take this medication? They need to know if you have any of these conditions: heart disease infection especially a viral infection such as chickenpox, cold sores, herpes, or hepatitis B lung or breathing disease an unusual or allergic reaction to daratumumab, hyaluronidase, other medicines, foods, dyes, or preservatives pregnant or trying to get pregnant breast-feeding How should I use this medication? This medicine is for injection under the skin. It is given by a health care professional in a hospital or clinic setting. Talk to your pediatrician regarding the use of this medicine in children. Special  care may be needed. Overdosage: If you think you have taken too much of this medicine contact a poison control center or emergency room at once. NOTE: This medicine is only for you. Do not share this medicine with others. What if I miss a dose? Keep appointments for follow-up doses as directed. It is important not to miss your dose. Call your doctor or health care professional if you are unable to keep an appointment. What may interact with this medication? Interactions have not been studied. This list may not describe all possible interactions. Give your health care provider a list of all the medicines, herbs, non-prescription drugs, or dietary supplements you use. Also tell them if you smoke, drink alcohol, or use illegal drugs. Some items may interact with your medicine. What should I watch for while using this medication? Your condition will be monitored carefully while you are receiving this medicine. This medicine can cause serious allergic reactions. To reduce your risk, your health care provider may give you other medicine to take before receiving this one. Be sure to follow the directions  from your health care provider. This medicine can affect the results of blood tests to match your blood type. These changes can last for up to 6 months after the final dose. Your healthcare provider will do blood tests to match your blood type before you start treatment. Tell all of your healthcare providers that you are being treated with this medicine before receiving a blood transfusion. This medicine can affect the results of some tests used to determine treatment response; extra tests may be needed to evaluate response. Do not become pregnant while taking this medicine or for 3 months after stopping it. Women should inform their health care provider if they wish to become pregnant or think they might be pregnant. There is a potential for serious side effects to an unborn child. Talk to your health care  provider for more information. Do not breast-feed an infant while taking this medicine. What side effects may I notice from receiving this medication? Side effects that you should report to your care team as soon as possible: Allergic reactions--skin rash, itching or hives, swelling of the face, lips, or tongue Blood clot--chest pain, shortness of breath, pain, swelling or warmth in the leg Blurred vision Fast, irregular heartbeat Infection--fever, chills, cough, sore throat, pain or trouble passing urine Injection reactions--dizziness, fast heartbeat, feeling faint or lightheaded, falls, headache, increase in blood pressure, nausea, vomiting, or wheezing or trouble breathing with loud or whistling sounds Low red blood cell counts--trouble breathing, feeling faint, lightheaded or falls, unusually weak or tired Unusual bleeding or bruising Side effects that usually do not require medical attention (report these to your care team if they continue or are bothersome): Back pain Constipation Diarrhea Pain, tingling, numbness in the hands or feet Pain, redness, or irritation at site where injected Muscle cramp or pain Swelling of the ankles, feet, hands Tiredness Trouble sleeping This list may not describe all possible side effects. Call your doctor for medical advice about side effects. You may report side effects to FDA at 1-800-FDA-1088. Where should I keep my medication? This drug is given in a hospital or clinic and will not be stored at home. NOTE: This sheet is a summary. It may not cover all possible information. If you have questions about this medicine, talk to your doctor, pharmacist, or health care provider.  2023 Elsevier/Gold Standard (2021-02-04 00:00:00)

## 2021-08-08 NOTE — Progress Notes (Signed)
  [facility] OFFICE PROGRESS NOTE   Diagnosis: Multiple myeloma  INTERVAL HISTORY:   [name] returns as scheduled.  He continues weekly Velcade/daratumumab.  Skin rash continues to be resolved.  He complains of an alteration in taste.  Oral intake is good.  Occasional mild nausea.  No vomiting.  No diarrhea.  No urinary symptoms.  Low-grade back pain.  Right eye stye is better.  Objective:  Vital signs in last 24 hours:  Blood pressure 135/85, pulse 81, temperature 98.2 F (36.8 C), temperature source Oral, resp. rate 18, height $RemoveBe'5\' 8"'BHFIZFxEo$  (1.727 m), weight 162 lb 6.4 oz (73.7 kg), SpO2 100 %.    HEENT: No thrush or ulcers. Resp: Lungs clear bilaterally. Cardio: Regular rate and rhythm. GI: No hepatosplenomegaly. Vascular: No leg edema. Skin: No rash.   Lab Results:  Lab Results  Component Value Date   WBC 10.4 08/08/2021   HGB 11.5 (L) 08/08/2021   HCT 34.1 (L) 08/08/2021   MCV 96.3 08/08/2021   PLT 262 08/08/2021   NEUTROABS 6.7 08/08/2021    Imaging:  No results found.  Medications: I have reviewed the patient's current medications.  Assessment/Plan: Multiple myeloma -05/22/2021-kappa free light chain 11.3, lambda free light chain 17,028 -25 g proteinuria, 20 g lambda light chains -Serum immunofixation 05/22/2021-monoclonal lambda light chains -Bone marrow biopsy 05/23/2021-hypercellular marrow involved by plasma cell myeloma, 80-90% plasma cells on the bone marrow biopsy, diminished iron stains -Cycle 1 Velcade/Decadron 05/23/2021 (Decadron given 05/22/2021) -Cycle 1 DRVd 06/13/2021, treatment held 06/20/2021 due to a rash, Revlimid resumed 06/28/2021, daratumumab resumed 07/04/2021 Revlimid resumed 06/28/2021 Cycle 2 daratumumab/Revlimid/Decadron/Velcade 07/18/2021 (Revlimid started 07/19/2021, placed on hold 07/25/2021 secondary to a rash-last taken 07/24/2021)  Renal failure secondary to #1-improved Hypercalcemia-status post treat with calcitonin, pamidronate  05/22/2021-resolved Lytic bone lesions Anemia Thrombocytopenia Hypocalcemia-IV calcium gluconate 05/30/2021, resolved Rash 06/20/2021, appears consistent with a drug rash, recurrent rash 07/24/2021-Revlimid placed on hold  Disposition: Mr. [name] appears stable.  He is completing cycle 2 daratumumab/Velcade/Decadron.  Revlimid has been discontinued due to a rash.  Most recent light chain analysis showed significant improvement.  We will follow-up on the value from today.  CBC and chemistry panel from today reviewed.  Labs adequate to proceed with treatment as planned.  He will return to begin the next cycle of daratumumab/Velcade/Decadron in 1 week.  We will schedule an office visit in 3 weeks.  Ned Card ANP/GNP-BC   08/08/2021  12:00 PM

## 2021-08-09 ENCOUNTER — Other Ambulatory Visit: Payer: Self-pay | Admitting: *Deleted

## 2021-08-09 LAB — KAPPA/LAMBDA LIGHT CHAINS
Kappa free light chain: 11.9 mg/L (ref 3.3–19.4)
Kappa, lambda light chain ratio: 0.07 — ABNORMAL LOW (ref 0.26–1.65)
Lambda free light chains: 165.6 mg/L — ABNORMAL HIGH (ref 5.7–26.3)

## 2021-08-10 ENCOUNTER — Encounter: Payer: Self-pay | Admitting: *Deleted

## 2021-08-10 NOTE — Progress Notes (Signed)
Notified by Jordan King w/[facility] that patient declines BMT at this time. Did agree to see Dr. Feliciana Rossetti again on 09/08/21 at 1:30 pm to discuss plans.

## 2021-08-14 ENCOUNTER — Other Ambulatory Visit: Payer: Self-pay | Admitting: Oncology

## 2021-08-16 ENCOUNTER — Inpatient Hospital Stay: Payer: BLUE CROSS/BLUE SHIELD

## 2021-08-16 VITALS — BP 144/81 | HR 71 | Temp 98.0°F | Resp 18 | Wt 166.2 lb

## 2021-08-16 DIAGNOSIS — C9000 Multiple myeloma not having achieved remission: Secondary | ICD-10-CM | POA: Diagnosis not present

## 2021-08-16 LAB — CMP (CANCER CENTER ONLY)
ALT: 8 U/L (ref 0–44)
AST: 10 U/L — ABNORMAL LOW (ref 15–41)
Albumin: 4.4 g/dL (ref 3.5–5.0)
Alkaline Phosphatase: 65 U/L (ref 38–126)
Anion gap: 9 (ref 5–15)
BUN: 21 mg/dL — ABNORMAL HIGH (ref 6–20)
CO2: 25 mmol/L (ref 22–32)
Calcium: 10 mg/dL (ref 8.9–10.3)
Chloride: 103 mmol/L (ref 98–111)
Creatinine: 1.3 mg/dL — ABNORMAL HIGH (ref 0.61–1.24)
GFR, Estimated: >60 mL/min (ref >60)
Glucose, Bld: 102 mg/dL — ABNORMAL HIGH (ref 70–99)
Potassium: 4 mmol/L (ref 3.5–5.1)
Sodium: 137 mmol/L (ref 135–145)
Total Bilirubin: 0.2 mg/dL — ABNORMAL LOW (ref 0.3–1.2)
Total Protein: 6.9 g/dL (ref 6.5–8.1)

## 2021-08-16 LAB — CBC WITH DIFFERENTIAL (CANCER CENTER ONLY)
Abs Immature Granulocytes: 0.04 10*3/uL (ref 0.00–0.07)
Basophils Absolute: 0.1 10*3/uL (ref 0.0–0.1)
Basophils Relative: 1 %
Eosinophils Absolute: 0.6 10*3/uL — ABNORMAL HIGH (ref 0.0–0.5)
Eosinophils Relative: 5 %
HCT: 34.3 % — ABNORMAL LOW (ref 39.0–52.0)
Hemoglobin: 11.7 g/dL — ABNORMAL LOW (ref 13.0–17.0)
Immature Granulocytes: 0 %
Lymphocytes Relative: 21 %
Lymphs Abs: 2.5 10*3/uL (ref 0.7–4.0)
MCH: 33.1 pg (ref 26.0–34.0)
MCHC: 34.1 g/dL (ref 30.0–36.0)
MCV: 97.2 fL (ref 80.0–100.0)
Monocytes Absolute: 0.8 10*3/uL (ref 0.1–1.0)
Monocytes Relative: 7 %
Neutro Abs: 7.9 10*3/uL — ABNORMAL HIGH (ref 1.7–7.7)
Neutrophils Relative %: 66 %
Platelet Count: 231 10*3/uL (ref 150–400)
RBC: 3.53 MIL/uL — ABNORMAL LOW (ref 4.22–5.81)
RDW: 15.1 % (ref 11.5–15.5)
WBC Count: 11.9 10*3/uL — ABNORMAL HIGH (ref 4.0–10.5)
nRBC: 0 % (ref 0.0–0.2)

## 2021-08-16 MED ORDER — DIPHENHYDRAMINE HCL 25 MG PO CAPS
50.0000 mg | ORAL_CAPSULE | Freq: Once | ORAL | Status: AC
  Administered 2021-08-16: 50 mg via ORAL
  Filled 2021-08-16: qty 2

## 2021-08-16 MED ORDER — ACETAMINOPHEN 325 MG PO TABS
650.0000 mg | ORAL_TABLET | Freq: Once | ORAL | Status: AC
  Administered 2021-08-16: 650 mg via ORAL
  Filled 2021-08-16: qty 2

## 2021-08-16 MED ORDER — BORTEZOMIB CHEMO SQ INJECTION 3.5 MG (2.5MG/ML)
1.0000 mg/m2 | Freq: Once | INTRAMUSCULAR | Status: AC
  Administered 2021-08-16: 2 mg via SUBCUTANEOUS
  Filled 2021-08-16: qty 0.8

## 2021-08-16 MED ORDER — DEXAMETHASONE 4 MG PO TABS
20.0000 mg | ORAL_TABLET | Freq: Once | ORAL | Status: AC
  Administered 2021-08-16: 20 mg via ORAL
  Filled 2021-08-16: qty 5

## 2021-08-16 MED ORDER — DARATUMUMAB-HYALURONIDASE-FIHJ 1800-30000 MG-UT/15ML ~~LOC~~ SOLN
1800.0000 mg | Freq: Once | SUBCUTANEOUS | Status: AC
  Administered 2021-08-16: 1800 mg via SUBCUTANEOUS
  Filled 2021-08-16: qty 15

## 2021-08-16 MED ORDER — PROCHLORPERAZINE MALEATE 10 MG PO TABS
10.0000 mg | ORAL_TABLET | Freq: Once | ORAL | Status: AC
  Administered 2021-08-16: 10 mg via ORAL
  Filled 2021-08-16: qty 1

## 2021-08-16 NOTE — Patient Instructions (Signed)
[facility]  Discharge Instructions: Thank you for choosing [facility] to provide your oncology and hematology care.   If you have a lab appointment with the [facility], please go directly to the [facility] and check in at the registration area.   Wear comfortable clothing and clothing appropriate for easy access to any Portacath or PICC line.   We strive to give you quality time with your provider. You may need to reschedule your appointment if you arrive late (15 or more minutes).  Arriving late affects you and other patients whose appointments are after yours.  Also, if you miss three or more appointments without notifying the office, you may be dismissed from the clinic at the provider's discretion.      For prescription refill requests, have your pharmacy contact our office and allow 72 hours for refills to be completed.    Today you received the following chemotherapy and/or immunotherapy agents Velcade and Darzalex Faspro      To help prevent nausea and vomiting after your treatment, we encourage you to take your nausea medication as directed.  BELOW ARE SYMPTOMS THAT SHOULD BE REPORTED IMMEDIATELY: *FEVER GREATER THAN 100.4 F (38 C) OR HIGHER *CHILLS OR SWEATING *NAUSEA AND VOMITING THAT IS NOT CONTROLLED WITH YOUR NAUSEA MEDICATION *UNUSUAL SHORTNESS OF BREATH *UNUSUAL BRUISING OR BLEEDING *URINARY PROBLEMS (pain or burning when urinating, or frequent urination) *BOWEL PROBLEMS (unusual diarrhea, constipation, pain near the anus) TENDERNESS IN MOUTH AND THROAT WITH OR WITHOUT PRESENCE OF ULCERS (sore throat, sores in mouth, or a toothache) UNUSUAL RASH, SWELLING OR PAIN  UNUSUAL VAGINAL DISCHARGE OR ITCHING   Items with * indicate a potential emergency and should be followed up as soon as possible or go to the Emergency Department if any problems should occur.  Please show the CHEMOTHERAPY ALERT CARD or IMMUNOTHERAPY ALERT CARD  at check-in to the Emergency Department and triage nurse.  Should you have questions after your visit or need to cancel or reschedule your appointment, please contact [facility]  Dept: [phone]  and follow the prompts.  Office hours are 8:00 a.m. to 4:30 p.m. Monday - Friday. Please note that voicemails left after 4:00 p.m. may not be returned until the following business day.  We are closed weekends and major holidays. You have access to a nurse at all times for urgent questions. Please call the main number to the clinic Dept: [phone] and follow the prompts.   For any non-urgent questions, you may also contact your provider using MyChart. We now offer e-Visits for anyone 47 and older to request care online for non-urgent symptoms. For details visit [email or web address].   Also download the MyChart app! Go to the app store, search "MyChart", open the app, select , and log in with your MyChart username and password.  Due to Covid, a mask is required upon entering the hospital/clinic. If you do not have a mask, one will be given to you upon arrival. For doctor visits, patients may have 1 support person aged 14 or older with them. For treatment visits, patients cannot have anyone with them due to current Covid guidelines and our immunocompromised population.

## 2021-08-21 ENCOUNTER — Other Ambulatory Visit: Payer: Self-pay | Admitting: Oncology

## 2021-08-22 ENCOUNTER — Inpatient Hospital Stay: Payer: BLUE CROSS/BLUE SHIELD

## 2021-08-22 ENCOUNTER — Inpatient Hospital Stay: Payer: BLUE CROSS/BLUE SHIELD | Attending: Oncology

## 2021-08-22 VITALS — BP 137/82 | HR 68 | Temp 98.0°F | Resp 18 | Ht 68.0 in | Wt 166.0 lb

## 2021-08-22 DIAGNOSIS — Z5112 Encounter for antineoplastic immunotherapy: Secondary | ICD-10-CM | POA: Insufficient documentation

## 2021-08-22 DIAGNOSIS — C9000 Multiple myeloma not having achieved remission: Secondary | ICD-10-CM | POA: Diagnosis present

## 2021-08-22 DIAGNOSIS — Z79899 Other long term (current) drug therapy: Secondary | ICD-10-CM | POA: Insufficient documentation

## 2021-08-22 DIAGNOSIS — D649 Anemia, unspecified: Secondary | ICD-10-CM | POA: Insufficient documentation

## 2021-08-22 LAB — CBC WITH DIFFERENTIAL (CANCER CENTER ONLY)
Abs Immature Granulocytes: 0.03 10*3/uL (ref 0.00–0.07)
Basophils Absolute: 0.1 10*3/uL (ref 0.0–0.1)
Basophils Relative: 1 %
Eosinophils Absolute: 0.5 10*3/uL (ref 0.0–0.5)
Eosinophils Relative: 4 %
HCT: 35.7 % — ABNORMAL LOW (ref 39.0–52.0)
Hemoglobin: 12 g/dL — ABNORMAL LOW (ref 13.0–17.0)
Immature Granulocytes: 0 %
Lymphocytes Relative: 23 %
Lymphs Abs: 2.4 10*3/uL (ref 0.7–4.0)
MCH: 33 pg (ref 26.0–34.0)
MCHC: 33.6 g/dL (ref 30.0–36.0)
MCV: 98.1 fL (ref 80.0–100.0)
Monocytes Absolute: 0.7 10*3/uL (ref 0.1–1.0)
Monocytes Relative: 7 %
Neutro Abs: 6.6 10*3/uL (ref 1.7–7.7)
Neutrophils Relative %: 65 %
Platelet Count: 230 10*3/uL (ref 150–400)
RBC: 3.64 MIL/uL — ABNORMAL LOW (ref 4.22–5.81)
RDW: 15.3 % (ref 11.5–15.5)
WBC Count: 10.2 10*3/uL (ref 4.0–10.5)
nRBC: 0 % (ref 0.0–0.2)

## 2021-08-22 LAB — CMP (CANCER CENTER ONLY)
ALT: 6 U/L (ref 0–44)
AST: 9 U/L — ABNORMAL LOW (ref 15–41)
Albumin: 4.2 g/dL (ref 3.5–5.0)
Alkaline Phosphatase: 46 U/L (ref 38–126)
Anion gap: 10 (ref 5–15)
BUN: 19 mg/dL (ref 6–20)
CO2: 24 mmol/L (ref 22–32)
Calcium: 10.2 mg/dL (ref 8.9–10.3)
Chloride: 105 mmol/L (ref 98–111)
Creatinine: 1.12 mg/dL (ref 0.61–1.24)
GFR, Estimated: >60 mL/min (ref >60)
Glucose, Bld: 93 mg/dL (ref 70–99)
Potassium: 3.8 mmol/L (ref 3.5–5.1)
Sodium: 139 mmol/L (ref 135–145)
Total Bilirubin: 0.2 mg/dL — ABNORMAL LOW (ref 0.3–1.2)
Total Protein: 6.7 g/dL (ref 6.5–8.1)

## 2021-08-22 MED ORDER — DARATUMUMAB-HYALURONIDASE-FIHJ 1800-30000 MG-UT/15ML ~~LOC~~ SOLN
1800.0000 mg | Freq: Once | SUBCUTANEOUS | Status: AC
  Administered 2021-08-22: 1800 mg via SUBCUTANEOUS
  Filled 2021-08-22: qty 15

## 2021-08-22 MED ORDER — DEXAMETHASONE 4 MG PO TABS
20.0000 mg | ORAL_TABLET | Freq: Once | ORAL | Status: AC
  Administered 2021-08-22: 20 mg via ORAL
  Filled 2021-08-22: qty 5

## 2021-08-22 MED ORDER — ACETAMINOPHEN 325 MG PO TABS
650.0000 mg | ORAL_TABLET | Freq: Once | ORAL | Status: AC
  Administered 2021-08-22: 650 mg via ORAL
  Filled 2021-08-22: qty 2

## 2021-08-22 MED ORDER — BORTEZOMIB CHEMO SQ INJECTION 3.5 MG (2.5MG/ML)
1.0000 mg/m2 | Freq: Once | INTRAMUSCULAR | Status: AC
  Administered 2021-08-22: 2 mg via SUBCUTANEOUS
  Filled 2021-08-22: qty 0.8

## 2021-08-22 MED ORDER — PROCHLORPERAZINE MALEATE 10 MG PO TABS
ORAL_TABLET | ORAL | Status: AC
  Filled 2021-08-22: qty 1

## 2021-08-22 MED ORDER — PROCHLORPERAZINE MALEATE 10 MG PO TABS
10.0000 mg | ORAL_TABLET | Freq: Once | ORAL | Status: AC
  Administered 2021-08-22: 10 mg via ORAL
  Filled 2021-08-22: qty 1

## 2021-08-22 MED ORDER — DIPHENHYDRAMINE HCL 25 MG PO CAPS
50.0000 mg | ORAL_CAPSULE | Freq: Once | ORAL | Status: AC
  Administered 2021-08-22: 50 mg via ORAL
  Filled 2021-08-22: qty 2

## 2021-08-22 NOTE — Patient Instructions (Signed)
[facility]   Discharge Instructions: Thank you for choosing [facility] to provide your oncology and hematology care.   If you have a lab appointment with the [facility], please go directly to the [facility] and check in at the registration area.   Wear comfortable clothing and clothing appropriate for easy access to any Portacath or PICC line.   We strive to give you quality time with your provider. You may need to reschedule your appointment if you arrive late (15 or more minutes).  Arriving late affects you and other patients whose appointments are after yours.  Also, if you miss three or more appointments without notifying the office, you may be dismissed from the clinic at the provider's discretion.      For prescription refill requests, have your pharmacy contact our office and allow 72 hours for refills to be completed.    Today you received the following chemotherapy and/or immunotherapy agents Velcade, Darzalex Faspro      To help prevent nausea and vomiting after your treatment, we encourage you to take your nausea medication as directed.  BELOW ARE SYMPTOMS THAT SHOULD BE REPORTED IMMEDIATELY: *FEVER GREATER THAN 100.4 F (38 C) OR HIGHER *CHILLS OR SWEATING *NAUSEA AND VOMITING THAT IS NOT CONTROLLED WITH YOUR NAUSEA MEDICATION *UNUSUAL SHORTNESS OF BREATH *UNUSUAL BRUISING OR BLEEDING *URINARY PROBLEMS (pain or burning when urinating, or frequent urination) *BOWEL PROBLEMS (unusual diarrhea, constipation, pain near the anus) TENDERNESS IN MOUTH AND THROAT WITH OR WITHOUT PRESENCE OF ULCERS (sore throat, sores in mouth, or a toothache) UNUSUAL RASH, SWELLING OR PAIN  UNUSUAL VAGINAL DISCHARGE OR ITCHING   Items with * indicate a potential emergency and should be followed up as soon as possible or go to the Emergency Department if any problems should occur.  Please show the CHEMOTHERAPY ALERT CARD or IMMUNOTHERAPY ALERT CARD at  check-in to the Emergency Department and triage nurse.  Should you have questions after your visit or need to cancel or reschedule your appointment, please contact [facility]  Dept: [phone]  and follow the prompts.  Office hours are 8:00 a.m. to 4:30 p.m. Monday - Friday. Please note that voicemails left after 4:00 p.m. may not be returned until the following business day.  We are closed weekends and major holidays. You have access to a nurse at all times for urgent questions. Please call the main number to the clinic Dept: [phone] and follow the prompts.   For any non-urgent questions, you may also contact your provider using MyChart. We now offer e-Visits for anyone 33 and older to request care online for non-urgent symptoms. For details visit [email or web address].   Also download the MyChart app! Go to the app store, search "MyChart", open the app, select [facility], and log in with your MyChart username and password.  Due to Covid, a mask is required upon entering the hospital/clinic. If you do not have a mask, one will be given to you upon arrival. For doctor visits, patients may have 1 support person aged 64 or older with them. For treatment visits, patients cannot have anyone with them due to current Covid guidelines and our immunocompromised population.   Bortezomib injection What is this medication? BORTEZOMIB (bor TEZ oh mib) targets proteins in cancer cells and stops the cancer cells from growing. It treats multiple myeloma and mantle cell lymphoma. This medicine may be used for other purposes; ask your health care provider or pharmacist  if you have questions. COMMON BRAND NAME(S): Velcade What should I tell my care team before I take this medication? They need to know if you have any of these conditions: dehydration diabetes (high blood sugar) heart disease liver disease tingling of the fingers or toes or other nerve disorder an unusual or  allergic reaction to bortezomib, mannitol, boron, other medicines, foods, dyes, or preservatives pregnant or trying to get pregnant breast-feeding How should I use this medication? This medicine is injected into a vein or under the skin. It is given by a health care provider in a hospital or clinic setting. Talk to your health care provider about the use of this medicine in children. Special care may be needed. Overdosage: If you think you have taken too much of this medicine contact a poison control center or emergency room at once. NOTE: This medicine is only for you. Do not share this medicine with others. What if I miss a dose? Keep appointments for follow-up doses. It is important not to miss your dose. Call your health care provider if you are unable to keep an appointment. What may interact with this medication? This medicine may interact with the following medications: ketoconazole rifampin This list may not describe all possible interactions. Give your health care provider a list of all the medicines, herbs, non-prescription drugs, or dietary supplements you use. Also tell them if you smoke, drink alcohol, or use illegal drugs. Some items may interact with your medicine. What should I watch for while using this medication? Your condition will be monitored carefully while you are receiving this medicine. You may need blood work done while you are taking this medicine. You may get drowsy or dizzy. Do not drive, use machinery, or do anything that needs mental alertness until you know how this medicine affects you. Do not stand up or sit up quickly, especially if you are an older patient. This reduces the risk of dizzy or fainting spells This medicine may increase your risk of getting an infection. Call your health care provider for advice if you get a fever, chills, sore throat, or other symptoms of a cold or flu. Do not treat yourself. Try to avoid being around people who are sick. Check  with your health care provider if you have severe diarrhea, nausea, and vomiting, or if you sweat a lot. The loss of too much body fluid may make it dangerous for you to take this medicine. Do not become pregnant while taking this medicine or for 7 months after stopping it. Women should inform their health care provider if they wish to become pregnant or think they might be pregnant. Men should not father a child while taking this medicine and for 4 months after stopping it. There is a potential for serious harm to an unborn child. Talk to your health care provider for more information. Do not breast-feed an infant while taking this medicine or for 2 months after stopping it. This medicine may make it more difficult to get pregnant or father a child. Talk to your health care provider if you are concerned about your fertility. What side effects may I notice from receiving this medication? Side effects that you should report to your doctor or health care professional as soon as possible: allergic reactions (skin rash; itching or hives; swelling of the face, lips, or tongue) bleeding (bloody or black, tarry stools; red or dark brown urine; spitting up blood or brown material that looks like coffee grounds; red spots on  the skin; unusual bruising or bleeding from the eye, gums, or nose) blurred vision or changes in vision confusion constipation headache heart failure (trouble breathing; fast, irregular heartbeat; sudden weight gain; swelling of the ankles, feet, hands) infection (fever, chills, cough, sore throat, pain or trouble passing urine) lack or loss of appetite liver injury (dark yellow or brown urine; general ill feeling or flu-like symptoms; loss of appetite, right upper belly pain; yellowing of the eyes or skin) low blood pressure (dizziness; feeling faint or lightheaded, falls; unusually weak or tired) muscle cramps pain, redness, or irritation at site where injected pain, tingling,  numbness in the hands or feet seizures trouble breathing unusual bruising or bleeding Side effects that usually do not require medical attention (report to your doctor or health care professional if they continue or are bothersome): diarrhea nausea stomach pain trouble sleeping vomiting This list may not describe all possible side effects. Call your doctor for medical advice about side effects. You may report side effects to FDA at 1-800-FDA-1088. Where should I keep my medication? This medicine is given in a hospital or clinic. It will not be stored at home. NOTE: This sheet is a summary. It may not cover all possible information. If you have questions about this medicine, talk to your doctor, pharmacist, or health care provider.  2023 Elsevier/Gold Standard (2020-02-26 00:00:00)  Daratumumab injection What is this medication? DARATUMUMAB (dar a toom ue mab) is a monoclonal antibody. It is used to treat multiple myeloma. This medicine may be used for other purposes; ask your health care provider or pharmacist if you have questions. COMMON BRAND NAME(S): DARZALEX What should I tell my care team before I take this medication? They need to know if you have any of these conditions: hereditary fructose intolerance infection (especially a virus infection such as chickenpox, herpes, or hepatitis B virus) lung or breathing disease (asthma, COPD) an unusual or allergic reaction to daratumumab, sorbitol, other medicines, foods, dyes, or preservatives pregnant or trying to get pregnant breast-feeding How should I use this medication? This medicine is for infusion into a vein. It is given by a health care professional in a hospital or clinic setting. Talk to your pediatrician regarding the use of this medicine in children. Special care may be needed. Overdosage: If you think you have taken too much of this medicine contact a poison control center or emergency room at once. NOTE: This medicine  is only for you. Do not share this medicine with others. What if I miss a dose? Keep appointments for follow-up doses as directed. It is important not to miss your dose. Call your doctor or health care professional if you are unable to keep an appointment. What may interact with this medication? Interactions have not been studied. This list may not describe all possible interactions. Give your health care provider a list of all the medicines, herbs, non-prescription drugs, or dietary supplements you use. Also tell them if you smoke, drink alcohol, or use illegal drugs. Some items may interact with your medicine. What should I watch for while using this medication? Your condition will be monitored carefully while you are receiving this medicine. This medicine can cause serious allergic reactions. To reduce your risk, your health care provider may give you other medicine to take before receiving this one. Be sure to follow the directions from your health care provider. This medicine can affect the results of blood tests to match your blood type. These changes can last for up to 6  months after the final dose. Your healthcare provider will do blood tests to match your blood type before you start treatment. Tell all of your healthcare providers that you are being treated with this medicine before receiving a blood transfusion. This medicine can affect the results of some tests used to determine treatment response; extra tests may be needed to evaluate response. Do not become pregnant while taking this medicine or for 3 months after stopping it. Women should inform their health care provider if they wish to become pregnant or think they might be pregnant. There is a potential for serious side effects to an unborn child. Talk to your health care provider for more information. Do not breast-feed an infant while taking this medicine. What side effects may I notice from receiving this medication? Side effects  that you should report to your care team as soon as possible: Allergic reactions--skin rash, itching or hives, swelling of the face, lips, or tongue Blurred vision Infection--fever, chills, cough, sore throat, pain or trouble passing urine Infusion reactions--dizziness, fast heartbeat, feeling faint or lightheaded, falls, headache, increase in blood pressure, nausea, vomiting, or wheezing or trouble breathing with loud or whistling sounds Unusual bleeding or bruising Side effects that usually do not require medical attention (report to your care team if they continue or are bothersome): Constipation Diarrhea Pain, tingling, numbness in the hands or feet Swelling of the ankles, feet, hands Tiredness This list may not describe all possible side effects. Call your doctor for medical advice about side effects. You may report side effects to FDA at 1-800-FDA-1088. Where should I keep my medication? This drug is given in a hospital or clinic and will not be stored at home. NOTE: This sheet is a summary. It may not cover all possible information. If you have questions about this medicine, talk to your doctor, pharmacist, or health care provider.  2023 Elsevier/Gold Standard (2020-08-12 00:00:00)

## 2021-08-27 ENCOUNTER — Other Ambulatory Visit: Payer: Self-pay | Admitting: Oncology

## 2021-08-29 ENCOUNTER — Inpatient Hospital Stay: Payer: BLUE CROSS/BLUE SHIELD

## 2021-08-29 ENCOUNTER — Other Ambulatory Visit: Payer: Self-pay

## 2021-08-29 ENCOUNTER — Inpatient Hospital Stay (HOSPITAL_BASED_OUTPATIENT_CLINIC_OR_DEPARTMENT_OTHER): Payer: BLUE CROSS/BLUE SHIELD | Admitting: Oncology

## 2021-08-29 VITALS — BP 128/81 | HR 82 | Temp 98.1°F | Resp 18 | Ht 68.0 in | Wt 169.2 lb

## 2021-08-29 DIAGNOSIS — C9000 Multiple myeloma not having achieved remission: Secondary | ICD-10-CM | POA: Diagnosis not present

## 2021-08-29 LAB — CBC WITH DIFFERENTIAL (CANCER CENTER ONLY)
Abs Immature Granulocytes: 0.03 10*3/uL (ref 0.00–0.07)
Basophils Absolute: 0 10*3/uL (ref 0.0–0.1)
Basophils Relative: 0 %
Eosinophils Absolute: 0.5 10*3/uL (ref 0.0–0.5)
Eosinophils Relative: 4 %
HCT: 36.7 % — ABNORMAL LOW (ref 39.0–52.0)
Hemoglobin: 12.3 g/dL — ABNORMAL LOW (ref 13.0–17.0)
Immature Granulocytes: 0 %
Lymphocytes Relative: 18 %
Lymphs Abs: 2.1 10*3/uL (ref 0.7–4.0)
MCH: 33.2 pg (ref 26.0–34.0)
MCHC: 33.5 g/dL (ref 30.0–36.0)
MCV: 99.2 fL (ref 80.0–100.0)
Monocytes Absolute: 0.9 10*3/uL (ref 0.1–1.0)
Monocytes Relative: 8 %
Neutro Abs: 8.2 10*3/uL — ABNORMAL HIGH (ref 1.7–7.7)
Neutrophils Relative %: 70 %
Platelet Count: 234 10*3/uL (ref 150–400)
RBC: 3.7 MIL/uL — ABNORMAL LOW (ref 4.22–5.81)
RDW: 15.8 % — ABNORMAL HIGH (ref 11.5–15.5)
WBC Count: 11.7 10*3/uL — ABNORMAL HIGH (ref 4.0–10.5)
nRBC: 0 % (ref 0.0–0.2)

## 2021-08-29 LAB — CMP (CANCER CENTER ONLY)
ALT: 11 U/L (ref 0–44)
AST: 11 U/L — ABNORMAL LOW (ref 15–41)
Albumin: 4.2 g/dL (ref 3.5–5.0)
Alkaline Phosphatase: 60 U/L (ref 38–126)
Anion gap: 12 (ref 5–15)
BUN: 18 mg/dL (ref 6–20)
CO2: 24 mmol/L (ref 22–32)
Calcium: 10.1 mg/dL (ref 8.9–10.3)
Chloride: 101 mmol/L (ref 98–111)
Creatinine: 1.34 mg/dL — ABNORMAL HIGH (ref 0.61–1.24)
GFR, Estimated: >60 mL/min (ref >60)
Glucose, Bld: 132 mg/dL — ABNORMAL HIGH (ref 70–99)
Potassium: 3.9 mmol/L (ref 3.5–5.1)
Sodium: 137 mmol/L (ref 135–145)
Total Bilirubin: 0.2 mg/dL — ABNORMAL LOW (ref 0.3–1.2)
Total Protein: 6.7 g/dL (ref 6.5–8.1)

## 2021-08-29 MED ORDER — BORTEZOMIB CHEMO SQ INJECTION 3.5 MG (2.5MG/ML)
1.0000 mg/m2 | Freq: Once | INTRAMUSCULAR | Status: AC
  Administered 2021-08-29: 2 mg via SUBCUTANEOUS
  Filled 2021-08-29: qty 0.8

## 2021-08-29 MED ORDER — PROCHLORPERAZINE MALEATE 10 MG PO TABS
10.0000 mg | ORAL_TABLET | Freq: Once | ORAL | Status: AC
  Administered 2021-08-29: 10 mg via ORAL
  Filled 2021-08-29: qty 1

## 2021-08-29 MED ORDER — DEXAMETHASONE 4 MG PO TABS
20.0000 mg | ORAL_TABLET | Freq: Once | ORAL | Status: AC
  Administered 2021-08-29: 20 mg via ORAL
  Filled 2021-08-29: qty 5

## 2021-08-29 NOTE — Patient Instructions (Signed)
[facility]   Discharge Instructions: Thank you for choosing [facility] to provide your oncology and hematology care.   If you have a lab appointment with the Cancer Center, please go directly to the Cancer Center and check in at the registration area.   Wear comfortable clothing and clothing appropriate for easy access to any Portacath or PICC line.   We strive to give you quality time with your provider. You may need to reschedule your appointment if you arrive late (15 or more minutes).  Arriving late affects you and other patients whose appointments are after yours.  Also, if you miss three or more appointments without notifying the office, you may be dismissed from the clinic at the provider's discretion.      For prescription refill requests, have your pharmacy contact our office and allow 72 hours for refills to be completed.    Today you received the following chemotherapy and/or immunotherapy agents Velcade      To help prevent nausea and vomiting after your treatment, we encourage you to take your nausea medication as directed.  BELOW ARE SYMPTOMS THAT SHOULD BE REPORTED IMMEDIATELY: *FEVER GREATER THAN 100.4 F (38 C) OR HIGHER *CHILLS OR SWEATING *NAUSEA AND VOMITING THAT IS NOT CONTROLLED WITH YOUR NAUSEA MEDICATION *UNUSUAL SHORTNESS OF BREATH *UNUSUAL BRUISING OR BLEEDING *URINARY PROBLEMS (pain or burning when urinating, or frequent urination) *BOWEL PROBLEMS (unusual diarrhea, constipation, pain near the anus) TENDERNESS IN MOUTH AND THROAT WITH OR WITHOUT PRESENCE OF ULCERS (sore throat, sores in mouth, or a toothache) UNUSUAL RASH, SWELLING OR PAIN  UNUSUAL VAGINAL DISCHARGE OR ITCHING   Items with * indicate a potential emergency and should be followed up as soon as possible or go to the Emergency Department if any problems should occur.  Please show the CHEMOTHERAPY ALERT CARD or IMMUNOTHERAPY ALERT CARD at check-in to the  Emergency Department and triage nurse.  Should you have questions after your visit or need to cancel or reschedule your appointment, please contact [facility]  Dept: [phone]  and follow the prompts.  Office hours are 8:00 a.m. to 4:30 p.m. Monday - Friday. Please note that voicemails left after 4:00 p.m. may not be returned until the following business day.  We are closed weekends and major holidays. You have access to a nurse at all times for urgent questions. Please call the main number to the clinic Dept: [phone] and follow the prompts.   For any non-urgent questions, you may also contact your provider using MyChart. We now offer e-Visits for anyone 18 and older to request care online for non-urgent symptoms. For details visit [email or web address].   Also download the MyChart app! Go to the app store, search "MyChart", open the app, select [facility], and log in with your MyChart username and password.  Due to Covid, a mask is required upon entering the hospital/clinic. If you do not have a mask, one will be given to you upon arrival. For doctor visits, patients may have 1 support person aged 18 or older with them. For treatment visits, patients cannot have anyone with them due to current Covid guidelines and our immunocompromised population.   Bortezomib injection What is this medication? BORTEZOMIB (bor TEZ oh mib) targets proteins in cancer cells and stops the cancer cells from growing. It treats multiple myeloma and mantle cell lymphoma. This medicine may be used for other purposes; ask your health care provider or pharmacist if you   BRAND NAME(S): Velcade What should I tell my care team before I take this medication? They need to know if you have any of these conditions: dehydration diabetes (high blood sugar) heart disease liver disease tingling of the fingers or toes or other nerve disorder an unusual or allergic  reaction to bortezomib, mannitol, boron, other medicines, foods, dyes, or preservatives pregnant or trying to get pregnant breast-feeding How should I use this medication? This medicine is injected into a vein or under the skin. It is given by a health care provider in a hospital or clinic setting. Talk to your health care provider about the use of this medicine in children. Special care may be needed. Overdosage: If you think you have taken too much of this medicine contact a poison control center or emergency room at once. NOTE: This medicine is only for you. Do not share this medicine with others. What if I miss a dose? Keep appointments for follow-up doses. It is important not to miss your dose. Call your health care provider if you are unable to keep an appointment. What may interact with this medication? This medicine may interact with the following medications: ketoconazole rifampin This list may not describe all possible interactions. Give your health care provider a list of all the medicines, herbs, non-prescription drugs, or dietary supplements you use. Also tell them if you smoke, drink alcohol, or use illegal drugs. Some items may interact with your medicine. What should I watch for while using this medication? Your condition will be monitored carefully while you are receiving this medicine. You may need blood work done while you are taking this medicine. You may get drowsy or dizzy. Do not drive, use machinery, or do anything that needs mental alertness until you know how this medicine affects you. Do not stand up or sit up quickly, especially if you are an older patient. This reduces the risk of dizzy or fainting spells This medicine may increase your risk of getting an infection. Call your health care provider for advice if you get a fever, chills, sore throat, or other symptoms of a cold or flu. Do not treat yourself. Try to avoid being around people who are sick. Check with your  health care provider if you have severe diarrhea, nausea, and vomiting, or if you sweat a lot. The loss of too much body fluid may make it dangerous for you to take this medicine. Do not become pregnant while taking this medicine or for 7 months after stopping it. Women should inform their health care provider if they wish to become pregnant or think they might be pregnant. Men should not father a child while taking this medicine and for 4 months after stopping it. There is a potential for serious harm to an unborn child. Talk to your health care provider for more information. Do not breast-feed an infant while taking this medicine or for 2 months after stopping it. This medicine may make it more difficult to get pregnant or father a child. Talk to your health care provider if you are concerned about your fertility. What side effects may I notice from receiving this medication? Side effects that you should report to your doctor or health care professional as soon as possible: allergic reactions (skin rash; itching or hives; swelling of the face, lips, or tongue) bleeding (bloody or black, tarry stools; red or dark brown urine; spitting up blood or brown material that looks like coffee grounds; red spots on the skin; unusual bruising or  unusual bruising or bleeding from the eye, gums, or nose) blurred vision or changes in vision confusion constipation headache heart failure (trouble breathing; fast, irregular heartbeat; sudden weight gain; swelling of the ankles, feet, hands) infection (fever, chills, cough, sore throat, pain or trouble passing urine) lack or loss of appetite liver injury (dark yellow or brown urine; general ill feeling or flu-like symptoms; loss of appetite, right upper belly pain; yellowing of the eyes or skin) low blood pressure (dizziness; feeling faint or lightheaded, falls; unusually weak or tired) muscle cramps pain, redness, or irritation at site where injected pain, tingling, numbness in the  hands or feet seizures trouble breathing unusual bruising or bleeding Side effects that usually do not require medical attention (report to your doctor or health care professional if they continue or are bothersome): diarrhea nausea stomach pain trouble sleeping vomiting This list may not describe all possible side effects. Call your doctor for medical advice about side effects. You may report side effects to FDA at 1-800-FDA-1088. Where should I keep my medication? This medicine is given in a hospital or clinic. It will not be stored at home. NOTE: This sheet is a summary. It may not cover all possible information. If you have questions about this medicine, talk to your doctor, pharmacist, or health care provider.  2023 Elsevier/Gold Standard (2020-02-26 00:00:00) 

## 2021-08-29 NOTE — Progress Notes (Signed)
  [facility] OFFICE PROGRESS NOTE   Diagnosis: Multiple myeloma  INTERVAL HISTORY:   Mr. Gambrell returns as scheduled.  He continues Velcade/Decadron/daratumumab.  No rash.  He has intermittent low back pain.  He takes oxycodone as needed.  No neuropathy symptoms.  He reports an episode of hematospermia.  Objective:  Vital signs in last 24 hours:  Blood pressure 128/81, pulse 82, temperature 98.1 F (36.7 C), temperature source Oral, resp. rate 18, height 5' 8" (1.727 m), weight 169 lb 3.2 oz (76.7 kg), SpO2 96 %.    HEENT: No thrush Resp: Lungs clear bilaterally Cardio: Regular rate and rhythm GI: No hepatosplenomegaly, mild tenderness in the right upper abdomen Vascular: No leg edema    Portacath/PICC-without erythema  Lab Results:  Lab Results  Component Value Date   WBC 11.7 (H) 08/29/2021   HGB 12.3 (L) 08/29/2021   HCT 36.7 (L) 08/29/2021   MCV 99.2 08/29/2021   PLT 234 08/29/2021   NEUTROABS 8.2 (H) 08/29/2021    CMP  Lab Results  Component Value Date   NA 139 08/22/2021   K 3.8 08/22/2021   CL 105 08/22/2021   CO2 24 08/22/2021   GLUCOSE 93 08/22/2021   BUN 19 08/22/2021   CREATININE 1.12 08/22/2021   CALCIUM 10.2 08/22/2021   PROT 6.7 08/22/2021   ALBUMIN 4.2 08/22/2021   AST 9 (L) 08/22/2021   ALT 6 08/22/2021   ALKPHOS 46 08/22/2021   BILITOT 0.2 (L) 08/22/2021   GFRNONAA >60 08/22/2021    Medications: I have reviewed the patient's current medications.   Assessment/Plan: Multiple myeloma -05/22/2021-kappa free light chain 11.3, lambda free light chain 17,028 -25 g proteinuria, 20 g lambda light chains -Serum immunofixation 05/22/2021-monoclonal lambda light chains -Bone marrow biopsy 05/23/2021-hypercellular marrow involved by plasma cell myeloma, 80-90% plasma cells on the bone marrow biopsy, diminished iron stains -Cycle 1 Velcade/Decadron 05/23/2021 (Decadron given 05/22/2021) -Cycle 1 DRVd 06/13/2021, treatment held 06/20/2021 due to  a rash, Revlimid resumed 06/28/2021, daratumumab resumed 07/04/2021 Revlimid resumed 06/28/2021 Cycle 2 daratumumab/Revlimid/Decadron/Velcade 07/18/2021 (Revlimid started 07/19/2021, placed on hold 07/25/2021 secondary to a rash-last taken 07/24/2021) Treatment continued with weekly Velcade/Decadron and daratumumab  Renal failure secondary to #1-improved Hypercalcemia-status post treat with calcitonin, pamidronate 05/22/2021-resolved Lytic bone lesions Anemia Thrombocytopenia Hypocalcemia-IV calcium gluconate 05/30/2021, resolved Rash 06/20/2021, appears consistent with a drug rash, recurrent rash 07/24/2021-Revlimid placed on hold    Disposition: Mr Mccubbin appears stable.  He continues treatment with Velcade/Decadron/daratumumab.  He developed a rash on Revlimid.  He is tolerating the treatment well.  He will continue weekly Velcade.  He is now on a 2-week daratumumab schedule.  He is scheduled to see Dr. Feliciana Rossetti 09/08/2021.  He will return for an office visit here on 09/12/2021.  We will add Cytoxan to the systemic therapy regimen if he decides against stem cell therapy.  He received pamidronate when he had hypercalcemia in early March.  He will schedule a dental evaluation within the next few weeks with a plan to begin Zometa.  Betsy Coder, MD  08/29/2021  8:28 AM

## 2021-08-29 NOTE — Progress Notes (Signed)
Patient seen by Dr. Benay Spice today  Vitals are within treatment parameters.  Labs reviewed by Dr. Waldon Merl reviewed and are within treatment parameters  Per physician team, patient is ready for treatment and there are NO modifications to the treatment plan.

## 2021-08-30 LAB — KAPPA/LAMBDA LIGHT CHAINS
Kappa free light chain: 9 mg/L (ref 3.3–19.4)
Kappa, lambda light chain ratio: 0.04 — ABNORMAL LOW (ref 0.26–1.65)
Lambda free light chains: 233.5 mg/L — ABNORMAL HIGH (ref 5.7–26.3)

## 2021-09-03 ENCOUNTER — Other Ambulatory Visit: Payer: Self-pay | Admitting: Oncology

## 2021-09-05 ENCOUNTER — Inpatient Hospital Stay: Payer: BLUE CROSS/BLUE SHIELD

## 2021-09-05 ENCOUNTER — Other Ambulatory Visit: Payer: Self-pay

## 2021-09-05 VITALS — BP 128/79 | HR 74 | Temp 98.2°F | Resp 18 | Ht 68.0 in | Wt 172.2 lb

## 2021-09-05 DIAGNOSIS — C9000 Multiple myeloma not having achieved remission: Secondary | ICD-10-CM

## 2021-09-05 LAB — CMP (CANCER CENTER ONLY)
ALT: 12 U/L (ref 0–44)
AST: 10 U/L — ABNORMAL LOW (ref 15–41)
Albumin: 4.4 g/dL (ref 3.5–5.0)
Alkaline Phosphatase: 53 U/L (ref 38–126)
Anion gap: 8 (ref 5–15)
BUN: 18 mg/dL (ref 6–20)
CO2: 27 mmol/L (ref 22–32)
Calcium: 10.3 mg/dL (ref 8.9–10.3)
Chloride: 101 mmol/L (ref 98–111)
Creatinine: 1.28 mg/dL — ABNORMAL HIGH (ref 0.61–1.24)
GFR, Estimated: >60 mL/min (ref >60)
Glucose, Bld: 111 mg/dL — ABNORMAL HIGH (ref 70–99)
Potassium: 4 mmol/L (ref 3.5–5.1)
Sodium: 136 mmol/L (ref 135–145)
Total Bilirubin: 0.2 mg/dL — ABNORMAL LOW (ref 0.3–1.2)
Total Protein: 7 g/dL (ref 6.5–8.1)

## 2021-09-05 LAB — CBC WITH DIFFERENTIAL (CANCER CENTER ONLY)
Abs Immature Granulocytes: 0.04 10*3/uL (ref 0.00–0.07)
Basophils Absolute: 0 10*3/uL (ref 0.0–0.1)
Basophils Relative: 0 %
Eosinophils Absolute: 0.3 10*3/uL (ref 0.0–0.5)
Eosinophils Relative: 3 %
HCT: 38.1 % — ABNORMAL LOW (ref 39.0–52.0)
Hemoglobin: 13 g/dL (ref 13.0–17.0)
Immature Granulocytes: 0 %
Lymphocytes Relative: 20 %
Lymphs Abs: 2.3 10*3/uL (ref 0.7–4.0)
MCH: 33.7 pg (ref 26.0–34.0)
MCHC: 34.1 g/dL (ref 30.0–36.0)
MCV: 98.7 fL (ref 80.0–100.0)
Monocytes Absolute: 0.9 10*3/uL (ref 0.1–1.0)
Monocytes Relative: 8 %
Neutro Abs: 8.2 10*3/uL — ABNORMAL HIGH (ref 1.7–7.7)
Neutrophils Relative %: 69 %
Platelet Count: 208 10*3/uL (ref 150–400)
RBC: 3.86 MIL/uL — ABNORMAL LOW (ref 4.22–5.81)
RDW: 15.7 % — ABNORMAL HIGH (ref 11.5–15.5)
WBC Count: 11.8 10*3/uL — ABNORMAL HIGH (ref 4.0–10.5)
nRBC: 0 % (ref 0.0–0.2)

## 2021-09-05 MED ORDER — ALBUTEROL SULFATE HFA 108 (90 BASE) MCG/ACT IN AERS: 2.0000 | INHALATION_SPRAY | Freq: Four times a day (QID) | RESPIRATORY_TRACT | 0 refills | Status: AC | PRN

## 2021-09-05 MED ORDER — DARATUMUMAB-HYALURONIDASE-FIHJ 1800-30000 MG-UT/15ML ~~LOC~~ SOLN
1800.0000 mg | Freq: Once | SUBCUTANEOUS | Status: AC
  Administered 2021-09-05: 1800 mg via SUBCUTANEOUS
  Filled 2021-09-05: qty 15

## 2021-09-05 MED ORDER — DIPHENHYDRAMINE HCL 25 MG PO CAPS
50.0000 mg | ORAL_CAPSULE | Freq: Once | ORAL | Status: AC
  Administered 2021-09-05: 50 mg via ORAL
  Filled 2021-09-05: qty 2

## 2021-09-05 MED ORDER — CLOTRIMAZOLE-BETAMETHASONE 1-0.05 % EX CREA: 1.0000 | TOPICAL_CREAM | Freq: Two times a day (BID) | CUTANEOUS | 0 refills | Status: AC | PRN

## 2021-09-05 MED ORDER — DEXAMETHASONE 4 MG PO TABS
20.0000 mg | ORAL_TABLET | Freq: Once | ORAL | Status: AC
  Administered 2021-09-05: 20 mg via ORAL
  Filled 2021-09-05: qty 5

## 2021-09-05 MED ORDER — ACETAMINOPHEN 325 MG PO TABS
650.0000 mg | ORAL_TABLET | Freq: Once | ORAL | Status: AC
  Administered 2021-09-05: 650 mg via ORAL
  Filled 2021-09-05: qty 2

## 2021-09-05 MED ORDER — BORTEZOMIB CHEMO SQ INJECTION 3.5 MG (2.5MG/ML)
1.0000 mg/m2 | Freq: Once | INTRAMUSCULAR | Status: AC
  Administered 2021-09-05: 2 mg via SUBCUTANEOUS
  Filled 2021-09-05: qty 0.8

## 2021-09-05 MED ORDER — PROCHLORPERAZINE MALEATE 10 MG PO TABS
10.0000 mg | ORAL_TABLET | Freq: Once | ORAL | Status: AC
  Administered 2021-09-05: 10 mg via ORAL
  Filled 2021-09-05: qty 1

## 2021-09-05 NOTE — Patient Instructions (Signed)
[facility]   Discharge Instructions: Thank you for choosing [facility] to provide your oncology and hematology care.   If you have a lab appointment with the [facility], please go directly to the [facility] and check in at the registration area.   Wear comfortable clothing and clothing appropriate for easy access to any Portacath or PICC line.   We strive to give you quality time with your provider. You may need to reschedule your appointment if you arrive late (15 or more minutes).  Arriving late affects you and other patients whose appointments are after yours.  Also, if you miss three or more appointments without notifying the office, you may be dismissed from the clinic at the provider's discretion.      For prescription refill requests, have your pharmacy contact our office and allow 72 hours for refills to be completed.    Today you received the following chemotherapy and/or immunotherapy agents Velcade, Darzalex Faspro      To help prevent nausea and vomiting after your treatment, we encourage you to take your nausea medication as directed.  BELOW ARE SYMPTOMS THAT SHOULD BE REPORTED IMMEDIATELY: *FEVER GREATER THAN 100.4 F (38 C) OR HIGHER *CHILLS OR SWEATING *NAUSEA AND VOMITING THAT IS NOT CONTROLLED WITH YOUR NAUSEA MEDICATION *UNUSUAL SHORTNESS OF BREATH *UNUSUAL BRUISING OR BLEEDING *URINARY PROBLEMS (pain or burning when urinating, or frequent urination) *BOWEL PROBLEMS (unusual diarrhea, constipation, pain near the anus) TENDERNESS IN MOUTH AND THROAT WITH OR WITHOUT PRESENCE OF ULCERS (sore throat, sores in mouth, or a toothache) UNUSUAL RASH, SWELLING OR PAIN  UNUSUAL VAGINAL DISCHARGE OR ITCHING   Items with * indicate a potential emergency and should be followed up as soon as possible or go to the Emergency Department if any problems should occur.  Please show the CHEMOTHERAPY ALERT CARD or IMMUNOTHERAPY ALERT CARD at  check-in to the Emergency Department and triage nurse.  Should you have questions after your visit or need to cancel or reschedule your appointment, please contact [facility]  Dept: [phone]  and follow the prompts.  Office hours are 8:00 a.m. to 4:30 p.m. Monday - Friday. Please note that voicemails left after 4:00 p.m. may not be returned until the following business day.  We are closed weekends and major holidays. You have access to a nurse at all times for urgent questions. Please call the main number to the clinic Dept: [phone] and follow the prompts.   For any non-urgent questions, you may also contact your provider using MyChart. We now offer e-Visits for anyone 9 and older to request care online for non-urgent symptoms. For details visit [email or web address].   Also download the MyChart app! Go to the app store, search "MyChart", open the app, select [facility], and log in with your MyChart username and password.  Masks are optional in the cancer centers. If you would like for your care team to wear a mask while they are taking care of you, please let them know. For doctor visits, patients may have with them one support person who is at least 50 years old. At this time, visitors are not allowed in the infusion area.  Bortezomib injection What is this medication? BORTEZOMIB (bor TEZ oh mib) targets proteins in cancer cells and stops the cancer cells from growing. It treats multiple myeloma and mantle cell lymphoma. This medicine may be used for other purposes; ask your health care provider or pharmacist if  you have questions. COMMON BRAND NAME(S): Velcade What should I tell my care team before I take this medication? They need to know if you have any of these conditions: dehydration diabetes (high blood sugar) heart disease liver disease tingling of the fingers or toes or other nerve disorder an unusual or allergic reaction to bortezomib,  mannitol, boron, other medicines, foods, dyes, or preservatives pregnant or trying to get pregnant breast-feeding How should I use this medication? This medicine is injected into a vein or under the skin. It is given by a health care provider in a hospital or clinic setting. Talk to your health care provider about the use of this medicine in children. Special care may be needed. Overdosage: If you think you have taken too much of this medicine contact a poison control center or emergency room at once. NOTE: This medicine is only for you. Do not share this medicine with others. What if I miss a dose? Keep appointments for follow-up doses. It is important not to miss your dose. Call your health care provider if you are unable to keep an appointment. What may interact with this medication? This medicine may interact with the following medications: ketoconazole rifampin This list may not describe all possible interactions. Give your health care provider a list of all the medicines, herbs, non-prescription drugs, or dietary supplements you use. Also tell them if you smoke, drink alcohol, or use illegal drugs. Some items may interact with your medicine. What should I watch for while using this medication? Your condition will be monitored carefully while you are receiving this medicine. You may need blood work done while you are taking this medicine. You may get drowsy or dizzy. Do not drive, use machinery, or do anything that needs mental alertness until you know how this medicine affects you. Do not stand up or sit up quickly, especially if you are an older patient. This reduces the risk of dizzy or fainting spells This medicine may increase your risk of getting an infection. Call your health care provider for advice if you get a fever, chills, sore throat, or other symptoms of a cold or flu. Do not treat yourself. Try to avoid being around people who are sick. Check with your health care provider if  you have severe diarrhea, nausea, and vomiting, or if you sweat a lot. The loss of too much body fluid may make it dangerous for you to take this medicine. Do not become pregnant while taking this medicine or for 7 months after stopping it. Women should inform their health care provider if they wish to become pregnant or think they might be pregnant. Men should not father a child while taking this medicine and for 4 months after stopping it. There is a potential for serious harm to an unborn child. Talk to your health care provider for more information. Do not breast-feed an infant while taking this medicine or for 2 months after stopping it. This medicine may make it more difficult to get pregnant or father a child. Talk to your health care provider if you are concerned about your fertility. What side effects may I notice from receiving this medication? Side effects that you should report to your doctor or health care professional as soon as possible: allergic reactions (skin rash; itching or hives; swelling of the face, lips, or tongue) bleeding (bloody or black, tarry stools; red or dark brown urine; spitting up blood or brown material that looks like coffee grounds; red spots on the  skin; unusual bruising or bleeding from the eye, gums, or nose) blurred vision or changes in vision confusion constipation headache heart failure (trouble breathing; fast, irregular heartbeat; sudden weight gain; swelling of the ankles, feet, hands) infection (fever, chills, cough, sore throat, pain or trouble passing urine) lack or loss of appetite liver injury (dark yellow or brown urine; general ill feeling or flu-like symptoms; loss of appetite, right upper belly pain; yellowing of the eyes or skin) low blood pressure (dizziness; feeling faint or lightheaded, falls; unusually weak or tired) muscle cramps pain, redness, or irritation at site where injected pain, tingling, numbness in the hands or  feet seizures trouble breathing unusual bruising or bleeding Side effects that usually do not require medical attention (report to your doctor or health care professional if they continue or are bothersome): diarrhea nausea stomach pain trouble sleeping vomiting This list may not describe all possible side effects. Call your doctor for medical advice about side effects. You may report side effects to FDA at 1-800-FDA-1088. Where should I keep my medication? This medicine is given in a hospital or clinic. It will not be stored at home. NOTE: This sheet is a summary. It may not cover all possible information. If you have questions about this medicine, talk to your doctor, pharmacist, or health care provider.  2023 Elsevier/Gold Standard (2020-02-26 00:00:00) Daratumumab; Hyaluronidase Injection What is this medication? DARATUMUMAB; HYALURONIDASE (dar a toom ue mab / hye al ur ON i dase) is a monoclonal antibody. Hyaluronidase is used to improve the effects of daratumumab. It treats certain types of cancer. Some of the cancers treated are multiple myeloma and light-chain amyloidosis. This medicine may be used for other purposes; ask your health care provider or pharmacist if you have questions. COMMON BRAND NAME(S): DARZALEX FASPRO What should I tell my care team before I take this medication? They need to know if you have any of these conditions: heart disease infection especially a viral infection such as chickenpox, cold sores, herpes, or hepatitis B lung or breathing disease an unusual or allergic reaction to daratumumab, hyaluronidase, other medicines, foods, dyes, or preservatives pregnant or trying to get pregnant breast-feeding How should I use this medication? This medicine is for injection under the skin. It is given by a health care professional in a hospital or clinic setting. Talk to your pediatrician regarding the use of this medicine in children. Special care may be  needed. Overdosage: If you think you have taken too much of this medicine contact a poison control center or emergency room at once. NOTE: This medicine is only for you. Do not share this medicine with others. What if I miss a dose? Keep appointments for follow-up doses as directed. It is important not to miss your dose. Call your doctor or health care professional if you are unable to keep an appointment. What may interact with this medication? Interactions have not been studied. This list may not describe all possible interactions. Give your health care provider a list of all the medicines, herbs, non-prescription drugs, or dietary supplements you use. Also tell them if you smoke, drink alcohol, or use illegal drugs. Some items may interact with your medicine. What should I watch for while using this medication? Your condition will be monitored carefully while you are receiving this medicine. This medicine can cause serious allergic reactions. To reduce your risk, your health care provider may give you other medicine to take before receiving this one. Be sure to follow the directions from your  health care provider. This medicine can affect the results of blood tests to match your blood type. These changes can last for up to 6 months after the final dose. Your healthcare provider will do blood tests to match your blood type before you start treatment. Tell all of your healthcare providers that you are being treated with this medicine before receiving a blood transfusion. This medicine can affect the results of some tests used to determine treatment response; extra tests may be needed to evaluate response. Do not become pregnant while taking this medicine or for 3 months after stopping it. Women should inform their health care provider if they wish to become pregnant or think they might be pregnant. There is a potential for serious side effects to an unborn child. Talk to your health care provider for  more information. Do not breast-feed an infant while taking this medicine. What side effects may I notice from receiving this medication? Side effects that you should report to your care team as soon as possible: Allergic reactions--skin rash, itching or hives, swelling of the face, lips, or tongue Blood clot--chest pain, shortness of breath, pain, swelling or warmth in the leg Blurred vision Fast, irregular heartbeat Infection--fever, chills, cough, sore throat, pain or trouble passing urine Injection reactions--dizziness, fast heartbeat, feeling faint or lightheaded, falls, headache, increase in blood pressure, nausea, vomiting, or wheezing or trouble breathing with loud or whistling sounds Low red blood cell counts--trouble breathing, feeling faint, lightheaded or falls, unusually weak or tired Unusual bleeding or bruising Side effects that usually do not require medical attention (report these to your care team if they continue or are bothersome): Back pain Constipation Diarrhea Pain, tingling, numbness in the hands or feet Pain, redness, or irritation at site where injected Muscle cramp or pain Swelling of the ankles, feet, hands Tiredness Trouble sleeping This list may not describe all possible side effects. Call your doctor for medical advice about side effects. You may report side effects to FDA at 1-800-FDA-1088. Where should I keep my medication? This drug is given in a hospital or clinic and will not be stored at home. NOTE: This sheet is a summary. It may not cover all possible information. If you have questions about this medicine, talk to your doctor, pharmacist, or health care provider.  2023 Elsevier/Gold Standard (2021-02-04 00:00:00)

## 2021-09-11 ENCOUNTER — Other Ambulatory Visit: Payer: Self-pay | Admitting: Oncology

## 2021-09-12 ENCOUNTER — Encounter: Payer: Self-pay | Admitting: Nurse Practitioner

## 2021-09-12 ENCOUNTER — Inpatient Hospital Stay: Payer: BLUE CROSS/BLUE SHIELD

## 2021-09-12 ENCOUNTER — Encounter: Payer: Self-pay | Admitting: *Deleted

## 2021-09-12 ENCOUNTER — Telehealth: Payer: Self-pay

## 2021-09-12 ENCOUNTER — Inpatient Hospital Stay (HOSPITAL_BASED_OUTPATIENT_CLINIC_OR_DEPARTMENT_OTHER): Payer: BLUE CROSS/BLUE SHIELD | Admitting: Nurse Practitioner

## 2021-09-12 VITALS — BP 130/84 | HR 85 | Temp 98.1°F | Resp 18 | Ht 68.0 in | Wt 172.0 lb

## 2021-09-12 DIAGNOSIS — C9000 Multiple myeloma not having achieved remission: Secondary | ICD-10-CM

## 2021-09-12 LAB — CBC WITH DIFFERENTIAL (CANCER CENTER ONLY)
Abs Immature Granulocytes: 0.03 10*3/uL (ref 0.00–0.07)
Basophils Absolute: 0 10*3/uL (ref 0.0–0.1)
Basophils Relative: 0 %
Eosinophils Absolute: 0.2 10*3/uL (ref 0.0–0.5)
Eosinophils Relative: 2 %
HCT: 39.3 % (ref 39.0–52.0)
Hemoglobin: 13.3 g/dL (ref 13.0–17.0)
Immature Granulocytes: 0 %
Lymphocytes Relative: 18 %
Lymphs Abs: 2.2 10*3/uL (ref 0.7–4.0)
MCH: 32.9 pg (ref 26.0–34.0)
MCHC: 33.8 g/dL (ref 30.0–36.0)
MCV: 97.3 fL (ref 80.0–100.0)
Monocytes Absolute: 1.1 10*3/uL — ABNORMAL HIGH (ref 0.1–1.0)
Monocytes Relative: 9 %
Neutro Abs: 8.8 10*3/uL — ABNORMAL HIGH (ref 1.7–7.7)
Neutrophils Relative %: 71 %
Platelet Count: 214 10*3/uL (ref 150–400)
RBC: 4.04 MIL/uL — ABNORMAL LOW (ref 4.22–5.81)
RDW: 15.3 % (ref 11.5–15.5)
WBC Count: 12.3 10*3/uL — ABNORMAL HIGH (ref 4.0–10.5)
nRBC: 0 % (ref 0.0–0.2)

## 2021-09-12 LAB — BASIC METABOLIC PANEL - CANCER CENTER ONLY
Anion gap: 13 (ref 5–15)
BUN: 20 mg/dL (ref 6–20)
CO2: 26 mmol/L (ref 22–32)
Calcium: 10.8 mg/dL — ABNORMAL HIGH (ref 8.9–10.3)
Chloride: 100 mmol/L (ref 98–111)
Creatinine: 1.29 mg/dL — ABNORMAL HIGH (ref 0.61–1.24)
GFR, Estimated: >60 mL/min (ref >60)
Glucose, Bld: 101 mg/dL — ABNORMAL HIGH (ref 70–99)
Potassium: 4 mmol/L (ref 3.5–5.1)
Sodium: 139 mmol/L (ref 135–145)

## 2021-09-12 MED ORDER — BORTEZOMIB CHEMO SQ INJECTION 3.5 MG (2.5MG/ML)
1.0000 mg/m2 | Freq: Once | INTRAMUSCULAR | Status: AC
  Administered 2021-09-12: 2 mg via SUBCUTANEOUS
  Filled 2021-09-12: qty 0.8

## 2021-09-12 MED ORDER — SODIUM CHLORIDE 0.9 % IV SOLN: Freq: Once | INTRAVENOUS | Status: AC

## 2021-09-12 MED ORDER — PROCHLORPERAZINE MALEATE 10 MG PO TABS
10.0000 mg | ORAL_TABLET | Freq: Once | ORAL | Status: AC
  Administered 2021-09-12: 10 mg via ORAL
  Filled 2021-09-12: qty 1

## 2021-09-12 MED ORDER — DEXAMETHASONE 4 MG PO TABS
20.0000 mg | ORAL_TABLET | Freq: Once | ORAL | Status: AC
  Administered 2021-09-12: 20 mg via ORAL
  Filled 2021-09-12: qty 5

## 2021-09-12 MED ORDER — ZOLEDRONIC ACID 4 MG/100ML IV SOLN
4.0000 mg | Freq: Once | INTRAVENOUS | Status: AC
  Administered 2021-09-12: 4 mg via INTRAVENOUS
  Filled 2021-09-12: qty 100

## 2021-09-12 MED ORDER — OXYCODONE-ACETAMINOPHEN 5-325 MG PO TABS: 1.0000 | ORAL_TABLET | Freq: Four times a day (QID) | ORAL | 0 refills | Status: DC | PRN

## 2021-09-12 MED ORDER — LORAZEPAM 0.5 MG PO TABS: 0.5000 mg | ORAL_TABLET | Freq: Three times a day (TID) | ORAL | 0 refills | Status: DC | PRN

## 2021-09-13 ENCOUNTER — Encounter: Payer: Self-pay | Admitting: *Deleted

## 2021-09-13 LAB — KAPPA/LAMBDA LIGHT CHAINS
Kappa free light chain: 9.2 mg/L (ref 3.3–19.4)
Kappa, lambda light chain ratio: 0.04 — ABNORMAL LOW (ref 0.26–1.65)
Lambda free light chains: 255 mg/L — ABNORMAL HIGH (ref 5.7–26.3)

## 2021-09-14 ENCOUNTER — Encounter: Payer: Self-pay | Admitting: Oncology

## 2021-09-16 ENCOUNTER — Other Ambulatory Visit: Payer: Self-pay | Admitting: Oncology

## 2021-09-19 ENCOUNTER — Inpatient Hospital Stay (HOSPITAL_BASED_OUTPATIENT_CLINIC_OR_DEPARTMENT_OTHER): Payer: BLUE CROSS/BLUE SHIELD | Admitting: Oncology

## 2021-09-19 ENCOUNTER — Inpatient Hospital Stay: Payer: BLUE CROSS/BLUE SHIELD | Attending: Oncology

## 2021-09-19 ENCOUNTER — Encounter: Payer: Self-pay | Admitting: *Deleted

## 2021-09-19 ENCOUNTER — Inpatient Hospital Stay: Payer: BLUE CROSS/BLUE SHIELD

## 2021-09-19 VITALS — BP 136/79 | HR 87 | Temp 98.1°F | Resp 18 | Ht 68.0 in | Wt 173.0 lb

## 2021-09-19 VITALS — BP 124/86 | HR 72

## 2021-09-19 DIAGNOSIS — Z5112 Encounter for antineoplastic immunotherapy: Secondary | ICD-10-CM | POA: Insufficient documentation

## 2021-09-19 DIAGNOSIS — C9000 Multiple myeloma not having achieved remission: Secondary | ICD-10-CM

## 2021-09-19 DIAGNOSIS — Z79899 Other long term (current) drug therapy: Secondary | ICD-10-CM | POA: Insufficient documentation

## 2021-09-19 DIAGNOSIS — D649 Anemia, unspecified: Secondary | ICD-10-CM | POA: Diagnosis not present

## 2021-09-19 DIAGNOSIS — D696 Thrombocytopenia, unspecified: Secondary | ICD-10-CM | POA: Insufficient documentation

## 2021-09-19 DIAGNOSIS — R21 Rash and other nonspecific skin eruption: Secondary | ICD-10-CM | POA: Diagnosis not present

## 2021-09-19 LAB — CMP (CANCER CENTER ONLY)
ALT: 9 U/L (ref 0–44)
AST: 10 U/L — ABNORMAL LOW (ref 15–41)
Albumin: 4.5 g/dL (ref 3.5–5.0)
Alkaline Phosphatase: 48 U/L (ref 38–126)
Anion gap: 9 (ref 5–15)
BUN: 14 mg/dL (ref 6–20)
CO2: 27 mmol/L (ref 22–32)
Calcium: 10.1 mg/dL (ref 8.9–10.3)
Chloride: 101 mmol/L (ref 98–111)
Creatinine: 1.14 mg/dL (ref 0.61–1.24)
GFR, Estimated: >60 mL/min (ref >60)
Glucose, Bld: 102 mg/dL — ABNORMAL HIGH (ref 70–99)
Potassium: 4 mmol/L (ref 3.5–5.1)
Sodium: 137 mmol/L (ref 135–145)
Total Bilirubin: 0.2 mg/dL — ABNORMAL LOW (ref 0.3–1.2)
Total Protein: 7.2 g/dL (ref 6.5–8.1)

## 2021-09-19 LAB — CBC WITH DIFFERENTIAL (CANCER CENTER ONLY)
Abs Immature Granulocytes: 0.04 10*3/uL (ref 0.00–0.07)
Basophils Absolute: 0.1 10*3/uL (ref 0.0–0.1)
Basophils Relative: 0 %
Eosinophils Absolute: 0.2 10*3/uL (ref 0.0–0.5)
Eosinophils Relative: 2 %
HCT: 40.8 % (ref 39.0–52.0)
Hemoglobin: 13.9 g/dL (ref 13.0–17.0)
Immature Granulocytes: 0 %
Lymphocytes Relative: 17 %
Lymphs Abs: 2.3 10*3/uL (ref 0.7–4.0)
MCH: 33.6 pg (ref 26.0–34.0)
MCHC: 34.1 g/dL (ref 30.0–36.0)
MCV: 98.6 fL (ref 80.0–100.0)
Monocytes Absolute: 1.1 10*3/uL — ABNORMAL HIGH (ref 0.1–1.0)
Monocytes Relative: 8 %
Neutro Abs: 9.9 10*3/uL — ABNORMAL HIGH (ref 1.7–7.7)
Neutrophils Relative %: 73 %
Platelet Count: 204 10*3/uL (ref 150–400)
RBC: 4.14 MIL/uL — ABNORMAL LOW (ref 4.22–5.81)
RDW: 15.4 % (ref 11.5–15.5)
WBC Count: 13.6 10*3/uL — ABNORMAL HIGH (ref 4.0–10.5)
nRBC: 0 % (ref 0.0–0.2)

## 2021-09-19 MED ORDER — BORTEZOMIB CHEMO SQ INJECTION 3.5 MG (2.5MG/ML)
1.0000 mg/m2 | Freq: Once | INTRAMUSCULAR | Status: AC
  Administered 2021-09-19: 2 mg via SUBCUTANEOUS
  Filled 2021-09-19: qty 0.8

## 2021-09-19 MED ORDER — ACETAMINOPHEN 325 MG PO TABS
650.0000 mg | ORAL_TABLET | Freq: Once | ORAL | Status: AC
  Administered 2021-09-19: 650 mg via ORAL
  Filled 2021-09-19: qty 2

## 2021-09-19 MED ORDER — PROCHLORPERAZINE MALEATE 10 MG PO TABS
10.0000 mg | ORAL_TABLET | Freq: Once | ORAL | Status: AC
  Administered 2021-09-19: 10 mg via ORAL
  Filled 2021-09-19: qty 1

## 2021-09-19 MED ORDER — DIPHENHYDRAMINE HCL 25 MG PO CAPS
50.0000 mg | ORAL_CAPSULE | Freq: Once | ORAL | Status: AC
  Administered 2021-09-19: 50 mg via ORAL
  Filled 2021-09-19: qty 2

## 2021-09-19 MED ORDER — DARATUMUMAB-HYALURONIDASE-FIHJ 1800-30000 MG-UT/15ML ~~LOC~~ SOLN
1800.0000 mg | Freq: Once | SUBCUTANEOUS | Status: AC
  Administered 2021-09-19: 1800 mg via SUBCUTANEOUS
  Filled 2021-09-19: qty 15

## 2021-09-19 MED ORDER — DEXAMETHASONE 4 MG PO TABS
20.0000 mg | ORAL_TABLET | Freq: Once | ORAL | Status: AC
  Administered 2021-09-19: 20 mg via ORAL
  Filled 2021-09-19: qty 5

## 2021-09-19 NOTE — Progress Notes (Signed)
Patient seen by Dr. Sherrill today ? ?Vitals are within treatment parameters. ? ?Labs reviewed by Dr. Sherrill and are within treatment parameters. ? ?Per physician team, patient is ready for treatment and there are NO modifications to the treatment plan.  ?

## 2021-09-19 NOTE — Patient Instructions (Signed)
[facility]   Discharge Instructions: Thank you for choosing [facility] to provide your oncology and hematology care.   If you have a lab appointment with the Cancer Center, please go directly to the Cancer Center and check in at the registration area.   Wear comfortable clothing and clothing appropriate for easy access to any Portacath or PICC line.   We strive to give you quality time with your provider. You may need to reschedule your appointment if you arrive late (15 or more minutes).  Arriving late affects you and other patients whose appointments are after yours.  Also, if you miss three or more appointments without notifying the office, you may be dismissed from the clinic at the provider's discretion.      For prescription refill requests, have your pharmacy contact our office and allow 72 hours for refills to be completed.    Today you received the following chemotherapy and/or immunotherapy agents Velcade, Darzalex Faspro      To help prevent nausea and vomiting after your treatment, we encourage you to take your nausea medication as directed.  BELOW ARE SYMPTOMS THAT SHOULD BE REPORTED IMMEDIATELY: *FEVER GREATER THAN 100.4 F (38 C) OR HIGHER *CHILLS OR SWEATING *NAUSEA AND VOMITING THAT IS NOT CONTROLLED WITH YOUR NAUSEA MEDICATION *UNUSUAL SHORTNESS OF BREATH *UNUSUAL BRUISING OR BLEEDING *URINARY PROBLEMS (pain or burning when urinating, or frequent urination) *BOWEL PROBLEMS (unusual diarrhea, constipation, pain near the anus) TENDERNESS IN MOUTH AND THROAT WITH OR WITHOUT PRESENCE OF ULCERS (sore throat, sores in mouth, or a toothache) UNUSUAL RASH, SWELLING OR PAIN  UNUSUAL VAGINAL DISCHARGE OR ITCHING   Items with * indicate a potential emergency and should be followed up as soon as possible or go to the Emergency Department if any problems should occur.  Please show the CHEMOTHERAPY ALERT CARD or IMMUNOTHERAPY ALERT CARD at  check-in to the Emergency Department and triage nurse.  Should you have questions after your visit or need to cancel or reschedule your appointment, please contact [facility]  Dept: [phone]  and follow the prompts.  Office hours are 8:00 a.m. to 4:30 p.m. Monday - Friday. Please note that voicemails left after 4:00 p.m. may not be returned until the following business day.  We are closed weekends and major holidays. You have access to a nurse at all times for urgent questions. Please call the main number to the clinic Dept: [phone] and follow the prompts.   For any non-urgent questions, you may also contact your provider using MyChart. We now offer e-Visits for anyone 18 and older to request care online for non-urgent symptoms. For details visit [email or web address].   Also download the MyChart app! Go to the app store, search "MyChart", open the app, select [facility], and log in with your MyChart username and password.  Masks are optional in the cancer centers. If you would like for your care team to wear a mask while they are taking care of you, please let them know. For doctor visits, patients may have with them one support person who is at least 50 years old. At this time, visitors are not allowed in the infusion area.  Bortezomib injection What is this medication? BORTEZOMIB (bor TEZ oh mib) targets proteins in cancer cells and stops the cancer cells from growing. It treats multiple myeloma and mantle cell lymphoma. This medicine may be used for other purposes; ask your health care provider or pharmacist if   have questions. COMMON BRAND NAME(S): Velcade What should I tell my care team before I take this medication? They need to know if you have any of these conditions: dehydration diabetes (high blood sugar) heart disease liver disease tingling of the fingers or toes or other nerve disorder an unusual or allergic reaction to bortezomib,  mannitol, boron, other medicines, foods, dyes, or preservatives pregnant or trying to get pregnant breast-feeding How should I use this medication? This medicine is injected into a vein or under the skin. It is given by a health care provider in a hospital or clinic setting. Talk to your health care provider about the use of this medicine in children. Special care may be needed. Overdosage: If you think you have taken too much of this medicine contact a poison control center or emergency room at once. NOTE: This medicine is only for you. Do not share this medicine with others. What if I miss a dose? Keep appointments for follow-up doses. It is important not to miss your dose. Call your health care provider if you are unable to keep an appointment. What may interact with this medication? This medicine may interact with the following medications: ketoconazole rifampin This list may not describe all possible interactions. Give your health care provider a list of all the medicines, herbs, non-prescription drugs, or dietary supplements you use. Also tell them if you smoke, drink alcohol, or use illegal drugs. Some items may interact with your medicine. What should I watch for while using this medication? Your condition will be monitored carefully while you are receiving this medicine. You may need blood work done while you are taking this medicine. You may get drowsy or dizzy. Do not drive, use machinery, or do anything that needs mental alertness until you know how this medicine affects you. Do not stand up or sit up quickly, especially if you are an older patient. This reduces the risk of dizzy or fainting spells This medicine may increase your risk of getting an infection. Call your health care provider for advice if you get a fever, chills, sore throat, or other symptoms of a cold or flu. Do not treat yourself. Try to avoid being around people who are sick. Check with your health care provider if  you have severe diarrhea, nausea, and vomiting, or if you sweat a lot. The loss of too much body fluid may make it dangerous for you to take this medicine. Do not become pregnant while taking this medicine or for 7 months after stopping it. Women should inform their health care provider if they wish to become pregnant or think they might be pregnant. Men should not father a child while taking this medicine and for 4 months after stopping it. There is a potential for serious harm to an unborn child. Talk to your health care provider for more information. Do not breast-feed an infant while taking this medicine or for 2 months after stopping it. This medicine may make it more difficult to get pregnant or father a child. Talk to your health care provider if you are concerned about your fertility. What side effects may I notice from receiving this medication? Side effects that you should report to your doctor or health care professional as soon as possible: allergic reactions (skin rash; itching or hives; swelling of the face, lips, or tongue) bleeding (bloody or black, tarry stools; red or dark brown urine; spitting up blood or brown material that looks like coffee grounds; red spots on the skin;  skin; unusual bruising or bleeding from the eye, gums, or nose) blurred vision or changes in vision confusion constipation headache heart failure (trouble breathing; fast, irregular heartbeat; sudden weight gain; swelling of the ankles, feet, hands) infection (fever, chills, cough, sore throat, pain or trouble passing urine) lack or loss of appetite liver injury (dark yellow or brown urine; general ill feeling or flu-like symptoms; loss of appetite, right upper belly pain; yellowing of the eyes or skin) low blood pressure (dizziness; feeling faint or lightheaded, falls; unusually weak or tired) muscle cramps pain, redness, or irritation at site where injected pain, tingling, numbness in the hands or  feet seizures trouble breathing unusual bruising or bleeding Side effects that usually do not require medical attention (report to your doctor or health care professional if they continue or are bothersome): diarrhea nausea stomach pain trouble sleeping vomiting This list may not describe all possible side effects. Call your doctor for medical advice about side effects. You may report side effects to FDA at 1-800-FDA-1088. Where should I keep my medication? This medicine is given in a hospital or clinic. It will not be stored at home. NOTE: This sheet is a summary. It may not cover all possible information. If you have questions about this medicine, talk to your doctor, pharmacist, or health care provider.  2023 Elsevier/Gold Standard (2020-02-26 00:00:00) Daratumumab; Hyaluronidase Injection What is this medication? DARATUMUMAB; HYALURONIDASE (dar a toom ue mab / hye al ur ON i dase) is a monoclonal antibody. Hyaluronidase is used to improve the effects of daratumumab. It treats certain types of cancer. Some of the cancers treated are multiple myeloma and light-chain amyloidosis. This medicine may be used for other purposes; ask your health care provider or pharmacist if you have questions. COMMON BRAND NAME(S): DARZALEX FASPRO What should I tell my care team before I take this medication? They need to know if you have any of these conditions: heart disease infection especially a viral infection such as chickenpox, cold sores, herpes, or hepatitis B lung or breathing disease an unusual or allergic reaction to daratumumab, hyaluronidase, other medicines, foods, dyes, or preservatives pregnant or trying to get pregnant breast-feeding How should I use this medication? This medicine is for injection under the skin. It is given by a health care professional in a hospital or clinic setting. Talk to your pediatrician regarding the use of this medicine in children. Special care may be  needed. Overdosage: If you think you have taken too much of this medicine contact a poison control center or emergency room at once. NOTE: This medicine is only for you. Do not share this medicine with others. What if I miss a dose? Keep appointments for follow-up doses as directed. It is important not to miss your dose. Call your doctor or health care professional if you are unable to keep an appointment. What may interact with this medication? Interactions have not been studied. This list may not describe all possible interactions. Give your health care provider a list of all the medicines, herbs, non-prescription drugs, or dietary supplements you use. Also tell them if you smoke, drink alcohol, or use illegal drugs. Some items may interact with your medicine. What should I watch for while using this medication? Your condition will be monitored carefully while you are receiving this medicine. This medicine can cause serious allergic reactions. To reduce your risk, your health care provider may give you other medicine to take before receiving this one. Be sure to follow the directions from your   health care provider. This medicine can affect the results of blood tests to match your blood type. These changes can last for up to 6 months after the final dose. Your healthcare provider will do blood tests to match your blood type before you start treatment. Tell all of your healthcare providers that you are being treated with this medicine before receiving a blood transfusion. This medicine can affect the results of some tests used to determine treatment response; extra tests may be needed to evaluate response. Do not become pregnant while taking this medicine or for 3 months after stopping it. Women should inform their health care provider if they wish to become pregnant or think they might be pregnant. There is a potential for serious side effects to an unborn child. Talk to your health care provider for  more information. Do not breast-feed an infant while taking this medicine. What side effects may I notice from receiving this medication? Side effects that you should report to your care team as soon as possible: Allergic reactions--skin rash, itching or hives, swelling of the face, lips, or tongue Blood clot--chest pain, shortness of breath, pain, swelling or warmth in the leg Blurred vision Fast, irregular heartbeat Infection--fever, chills, cough, sore throat, pain or trouble passing urine Injection reactions--dizziness, fast heartbeat, feeling faint or lightheaded, falls, headache, increase in blood pressure, nausea, vomiting, or wheezing or trouble breathing with loud or whistling sounds Low red blood cell counts--trouble breathing, feeling faint, lightheaded or falls, unusually weak or tired Unusual bleeding or bruising Side effects that usually do not require medical attention (report these to your care team if they continue or are bothersome): Back pain Constipation Diarrhea Pain, tingling, numbness in the hands or feet Pain, redness, or irritation at site where injected Muscle cramp or pain Swelling of the ankles, feet, hands Tiredness Trouble sleeping This list may not describe all possible side effects. Call your doctor for medical advice about side effects. You may report side effects to FDA at 1-800-FDA-1088. Where should I keep my medication? This drug is given in a hospital or clinic and will not be stored at home. NOTE: This sheet is a summary. It may not cover all possible information. If you have questions about this medicine, talk to your doctor, pharmacist, or health care provider.  2023 Elsevier/Gold Standard (2021-02-04 00:00:00)   

## 2021-09-19 NOTE — Progress Notes (Signed)
  [facility] OFFICE PROGRESS NOTE   Diagnosis: Multiple myeloma  INTERVAL HISTORY:   [name] returns as scheduled.  Continues treatment with daratumumab/Velcade/Decadron.  No neuropathy symptoms.  He generally feels well.  He does not wish to proceed with stem cell therapy.  He had discomfort in the left thigh for 3 days after receiving Zometa.  This has resolved.  Objective:  Vital signs in last 24 hours:  Blood pressure 136/79, pulse 87, temperature 98.1 F (36.7 C), temperature source Oral, resp. rate 18, height $RemoveBe'5\' 8"'mUJQclLnu$  (1.727 m), weight 173 lb (78.5 kg), SpO2 98 %.    HEENT: No thrush Resp: Lungs clear bilaterally Cardio: Regular rate and rhythm  GI: No hepatosplenomegaly Vascular: No leg edema   Lab Results:  Lab Results  Component Value Date   WBC 13.6 (H) 09/19/2021   HGB 13.9 09/19/2021   HCT 40.8 09/19/2021   MCV 98.6 09/19/2021   PLT 204 09/19/2021   NEUTROABS 9.9 (H) 09/19/2021    CMP  Lab Results  Component Value Date   NA 137 09/19/2021   K 4.0 09/19/2021   CL 101 09/19/2021   CO2 27 09/19/2021   GLUCOSE 102 (H) 09/19/2021   BUN 14 09/19/2021   CREATININE 1.14 09/19/2021   CALCIUM 10.1 09/19/2021   PROT 7.2 09/19/2021   ALBUMIN 4.5 09/19/2021   AST 10 (L) 09/19/2021   ALT 9 09/19/2021   ALKPHOS 48 09/19/2021   BILITOT 0.2 (L) 09/19/2021   GFRNONAA >60 09/19/2021    Medications: I have reviewed the patient's current medications.   Assessment/Plan: Multiple myeloma -05/22/2021-kappa free light chain 11.3, lambda free light chain 17,028 -25 g proteinuria, 20 g lambda light chains -Serum immunofixation 05/22/2021-monoclonal lambda light chains -Bone marrow biopsy 05/23/2021-hypercellular marrow involved by plasma cell myeloma, 80-90% plasma cells on the bone marrow biopsy, diminished iron stains -Cytogenetics-45 X, minus Y, t11:14 (11 q13) -Cycle 1 Velcade/Decadron 05/23/2021 (Decadron given 05/22/2021) -Cycle 1 DRVd 06/13/2021,  treatment held 06/20/2021 due to a rash, Revlimid resumed 06/28/2021, daratumumab resumed 07/04/2021 Revlimid resumed 06/28/2021 Cycle 2 daratumumab/Revlimid/Decadron/Velcade 07/18/2021 (Revlimid started 07/19/2021, placed on hold 07/25/2021 secondary to a rash-last taken 07/24/2021) Treatment continued with weekly Velcade/Decadron and daratumumab   Renal failure secondary to #1-improved Hypercalcemia-status post treat with calcitonin, pamidronate 05/22/2021-resolved Lytic bone lesions Anemia Thrombocytopenia Hypocalcemia-IV calcium gluconate 05/30/2021, resolved Rash 06/20/2021, appears consistent with a drug rash, recurrent rash 07/24/2021-Revlimid placed on hold    Disposition: Jordan King appears unchanged.  He continues treatment with Velcade/daratumumab/Decadron.  He is tolerating treatment well.  The serum free lambda light chains have been higher over the past month.  I discussed the case with Dr. Feliciana Rossetti.  He recommends changing to carfilzomib, pomalidomide, and Decadron.  Jordan King would rather continue the current treatment and add pomalidomide in place of lenalidomide.  He feels the light chains increased after the lenalidomide was taken out of the regimen.  He does not wish to begin KPD at present.  I think this is reasonable.  I will confirm there is precedent for a 4 drug regimen including pomalidomide.    He will clean another treatment with daratumumab and Velcade today.  We will contact when we have more information on adding pomalidomide to the Velcade, daratumumab, and Decadron regimen.  Betsy Coder, MD  09/19/2021  1:01 PM

## 2021-09-22 ENCOUNTER — Telehealth: Payer: Self-pay | Admitting: *Deleted

## 2021-09-22 NOTE — Telephone Encounter (Signed)
Informed Jordan King that Jordan King recommends to stop Velcade and proceed with Daratumumab and Dexamathasone and add Pomalidomide 4 mg daily x 21 days out of 28 day cycle. Patient agrees to this plan. Will complete [employer] paperwork on 7/11 visit. Had many questions that RN could not answer re: how often daratumumab will be given. Encouraged him to write down his questions to be discussed on 7/11 MD visit.

## 2021-09-24 ENCOUNTER — Other Ambulatory Visit: Payer: Self-pay | Admitting: Oncology

## 2021-09-27 ENCOUNTER — Other Ambulatory Visit: Payer: Self-pay | Admitting: *Deleted

## 2021-09-27 ENCOUNTER — Inpatient Hospital Stay: Payer: BLUE CROSS/BLUE SHIELD

## 2021-09-27 ENCOUNTER — Inpatient Hospital Stay (HOSPITAL_BASED_OUTPATIENT_CLINIC_OR_DEPARTMENT_OTHER): Payer: BLUE CROSS/BLUE SHIELD | Admitting: Oncology

## 2021-09-27 VITALS — BP 130/80 | HR 85 | Temp 98.2°F | Resp 18 | Ht 68.0 in | Wt 174.8 lb

## 2021-09-27 DIAGNOSIS — C9000 Multiple myeloma not having achieved remission: Secondary | ICD-10-CM

## 2021-09-27 LAB — CBC WITH DIFFERENTIAL (CANCER CENTER ONLY)
Abs Immature Granulocytes: 0.03 10*3/uL (ref 0.00–0.07)
Basophils Absolute: 0 10*3/uL (ref 0.0–0.1)
Basophils Relative: 0 %
Eosinophils Absolute: 0.3 10*3/uL (ref 0.0–0.5)
Eosinophils Relative: 2 %
HCT: 39.9 % (ref 39.0–52.0)
Hemoglobin: 14 g/dL (ref 13.0–17.0)
Immature Granulocytes: 0 %
Lymphocytes Relative: 19 %
Lymphs Abs: 2.3 10*3/uL (ref 0.7–4.0)
MCH: 34.1 pg — ABNORMAL HIGH (ref 26.0–34.0)
MCHC: 35.1 g/dL (ref 30.0–36.0)
MCV: 97.1 fL (ref 80.0–100.0)
Monocytes Absolute: 1.1 10*3/uL — ABNORMAL HIGH (ref 0.1–1.0)
Monocytes Relative: 9 %
Neutro Abs: 8.7 10*3/uL — ABNORMAL HIGH (ref 1.7–7.7)
Neutrophils Relative %: 70 %
Platelet Count: 201 10*3/uL (ref 150–400)
RBC: 4.11 MIL/uL — ABNORMAL LOW (ref 4.22–5.81)
RDW: 15.4 % (ref 11.5–15.5)
WBC Count: 12.4 10*3/uL — ABNORMAL HIGH (ref 4.0–10.5)
nRBC: 0 % (ref 0.0–0.2)

## 2021-09-27 NOTE — Progress Notes (Signed)
  [facility] OFFICE PROGRESS NOTE   Diagnosis: Multiple myeloma  INTERVAL HISTORY:   [name] completed another treatment with daratumumab and Velcade on 09/19/2021.  No rash or neuropathy symptoms.  He reports mild back pain.  He has increased malaise.  Good appetite.  Objective:  Vital signs in last 24 hours:  Blood pressure 130/80, pulse 85, temperature 98.2 F (36.8 C), temperature source Oral, resp. rate 18, height $RemoveBe'5\' 8"'ePjhiztjs$  (1.727 m), weight 174 lb 12.8 oz (79.3 kg), SpO2 98 %.    HEENT: Mild white coat over the tongue, no buccal thrush or ulcers Resp: Lungs clear bilaterally Cardio: Regular rate and rhythm GI: No hepatosplenomegaly Vascular: No leg edema  Skin: No rash  Portacath/PICC-without erythema  Lab Results:  Lab Results  Component Value Date   WBC 12.4 (H) 09/27/2021   HGB 14.0 09/27/2021   HCT 39.9 09/27/2021   MCV 97.1 09/27/2021   PLT 201 09/27/2021   NEUTROABS 8.7 (H) 09/27/2021    CMP  Lab Results  Component Value Date   NA 137 09/19/2021   K 4.0 09/19/2021   CL 101 09/19/2021   CO2 27 09/19/2021   GLUCOSE 102 (H) 09/19/2021   BUN 14 09/19/2021   CREATININE 1.14 09/19/2021   CALCIUM 10.1 09/19/2021   PROT 7.2 09/19/2021   ALBUMIN 4.5 09/19/2021   AST 10 (L) 09/19/2021   ALT 9 09/19/2021   ALKPHOS 48 09/19/2021   BILITOT 0.2 (L) 09/19/2021   GFRNONAA >60 09/19/2021   Free lambda light chains 255 on 09/12/2021  Medications: I have reviewed the patient's current medications.   Assessment/Plan:  Multiple myeloma -05/22/2021-kappa free light chain 11.3, lambda free light chain 17,028 -25 g proteinuria, 20 g lambda light chains -Serum immunofixation 05/22/2021-monoclonal lambda light chains -Bone marrow biopsy 05/23/2021-hypercellular marrow involved by plasma cell myeloma, 80-90% plasma cells on the bone marrow biopsy, diminished iron stains -Cytogenetics-45 X, minus Y, t11:14 (11 q13) -Cycle 1 Velcade/Decadron 05/23/2021  (Decadron given 05/22/2021) -Cycle 1 DRVd 06/13/2021, treatment held 06/20/2021 due to a rash, Revlimid resumed 06/28/2021, daratumumab resumed 07/04/2021 Revlimid resumed 06/28/2021 Cycle 2 daratumumab/Revlimid/Decadron/Velcade 07/18/2021 (Revlimid started 07/19/2021, placed on hold 07/25/2021 secondary to a rash-last taken 07/24/2021) Treatment continued with weekly Velcade/Decadron and daratumumab Treatment changed to pomalidomide, daratumumab, Decadron beginning week of 10/03/2021   Renal failure secondary to #1-improved Hypercalcemia-status post treat with calcitonin, pamidronate 05/22/2021-resolved Lytic bone lesions Anemia Thrombocytopenia Hypocalcemia-IV calcium gluconate 05/30/2021, resolved Rash 06/20/2021, appears consistent with a drug rash, recurrent rash 07/24/2021-Revlimid placed on hold     Disposition: Mr. Cartwright appears unchanged.  He has been treated with Velcade/Decadron and daratumumab over the past several months.  He developed a rash while on lenalidomide.  The serum light chains have been higher over the past 2 months.  I discussed the case with Dr. Feliciana Rossetti.  He recommends making a change in therapy.  Mr. Rounds does not wish to receive intravenous therapy.  He agrees to a trial of pomalidomide, daratumumab, and Decadron.  We reviewed potential toxicities associated with pomalidomide including the chance of rash, neuropathy, nausea, diarrhea, and hematologic toxicity. The plan is to begin pomalidomide on a 21/28-day schedule 10/03/2021.  He will continue weekly Decadron and every 2-week daratumumab.  He will continue every 3 months Zometa.  Mr. Lashley will return for an office visit on 10/17/2021.  Betsy Coder, MD  09/27/2021  2:04 PM

## 2021-09-28 ENCOUNTER — Telehealth: Payer: Self-pay

## 2021-09-28 ENCOUNTER — Other Ambulatory Visit: Payer: Self-pay | Admitting: *Deleted

## 2021-09-28 ENCOUNTER — Telehealth: Payer: Self-pay | Admitting: Pharmacist

## 2021-09-28 ENCOUNTER — Encounter: Payer: Self-pay | Admitting: *Deleted

## 2021-09-28 ENCOUNTER — Encounter: Payer: Self-pay | Admitting: Oncology

## 2021-09-28 ENCOUNTER — Other Ambulatory Visit (HOSPITAL_COMMUNITY): Payer: Self-pay

## 2021-09-28 DIAGNOSIS — C9000 Multiple myeloma not having achieved remission: Secondary | ICD-10-CM

## 2021-09-28 MED ORDER — DEXAMETHASONE 4 MG PO TABS: 20.0000 mg | ORAL_TABLET | ORAL | 0 refills | Status: DC

## 2021-09-28 MED ORDER — POMALIDOMIDE 4 MG PO CAPS: 4.0000 mg | ORAL_CAPSULE | Freq: Every day | ORAL | 0 refills | Status: DC

## 2021-09-28 NOTE — Telephone Encounter (Signed)
Oral Oncology Pharmacist Encounter  Received new prescription for Pomlyst (pomalidomide) for the treatment of multiple myeloma in conjunction with datatumumab and dexamethsone, planned duration until disease control or unacceptable drug toxicity. Patient previously was on lenalidomide but has developed a rash while on treatment. The plan is now switch him to pomalidomide and see is this is better tolerate.   Planned start on 10/03/21.  CMP from 09/19/21 assessed, no relevant lab abnormalities. Prescription dose and frequency assessed.   Current medication list in Epic reviewed, no relevant DDIs with pomalidomide identified.  Evaluated chart and no patient barriers to medication adherence identified.   Prescription has been e-scribed to the [facility] for benefits analysis and approval.  Oral Oncology Clinic will continue to follow for insurance authorization, copayment issues, initial counseling and start date.  Patient agreed to treatment on 09/27/21 per MD documentation.  Darl Pikes, PharmD, BCPS, BCOP, CPP Hematology/Oncology Clinical Pharmacist Practitioner [place]/DB/AP [facility] [phone]  09/28/2021 1:51 PM

## 2021-09-28 NOTE — Telephone Encounter (Signed)
Received notification that [facility] requires prior authorization for Jordan King '4MG'$ ]  PA submitted [7.12.23] Key [[id number]]  Status: [PENDING]

## 2021-09-28 NOTE — Progress Notes (Signed)
Weekly dexamethasone script sent to pharmacy

## 2021-09-28 NOTE — Progress Notes (Signed)
Registered w/REMS and script for pomalidomide sent to [facility]. Oral oncology team aware of change in therapy.

## 2021-09-29 ENCOUNTER — Other Ambulatory Visit (HOSPITAL_COMMUNITY): Payer: Self-pay

## 2021-09-29 NOTE — Telephone Encounter (Signed)
Patient Advocate Encounter  Prior Authorization for POMALYST '4MG'$  has been approved.    PA# [id number] Effective dates: 09/28/2021 through 09/29/2022  Per Test Claim: Patient must fill at [facility], [facility] Patient Advocate Fax:  [phone]

## 2021-10-03 ENCOUNTER — Inpatient Hospital Stay: Payer: BLUE CROSS/BLUE SHIELD

## 2021-10-03 VITALS — BP 121/73 | HR 72 | Temp 98.4°F | Resp 18 | Ht 68.0 in | Wt 176.4 lb

## 2021-10-03 DIAGNOSIS — C9000 Multiple myeloma not having achieved remission: Secondary | ICD-10-CM

## 2021-10-03 LAB — CBC WITH DIFFERENTIAL (CANCER CENTER ONLY)
Abs Immature Granulocytes: 0.03 10*3/uL (ref 0.00–0.07)
Basophils Absolute: 0.1 10*3/uL (ref 0.0–0.1)
Basophils Relative: 1 %
Eosinophils Absolute: 0.2 10*3/uL (ref 0.0–0.5)
Eosinophils Relative: 2 %
HCT: 40 % (ref 39.0–52.0)
Hemoglobin: 13.8 g/dL (ref 13.0–17.0)
Immature Granulocytes: 0 %
Lymphocytes Relative: 21 %
Lymphs Abs: 2.6 10*3/uL (ref 0.7–4.0)
MCH: 33.4 pg (ref 26.0–34.0)
MCHC: 34.5 g/dL (ref 30.0–36.0)
MCV: 96.9 fL (ref 80.0–100.0)
Monocytes Absolute: 0.9 10*3/uL (ref 0.1–1.0)
Monocytes Relative: 7 %
Neutro Abs: 8.7 10*3/uL — ABNORMAL HIGH (ref 1.7–7.7)
Neutrophils Relative %: 69 %
Platelet Count: 210 10*3/uL (ref 150–400)
RBC: 4.13 MIL/uL — ABNORMAL LOW (ref 4.22–5.81)
RDW: 15.6 % — ABNORMAL HIGH (ref 11.5–15.5)
WBC Count: 12.4 10*3/uL — ABNORMAL HIGH (ref 4.0–10.5)
nRBC: 0 % (ref 0.0–0.2)

## 2021-10-03 MED ORDER — ACETAMINOPHEN 325 MG PO TABS
650.0000 mg | ORAL_TABLET | Freq: Once | ORAL | Status: AC
  Administered 2021-10-03: 650 mg via ORAL
  Filled 2021-10-03: qty 2

## 2021-10-03 MED ORDER — DIPHENHYDRAMINE HCL 25 MG PO CAPS
50.0000 mg | ORAL_CAPSULE | Freq: Once | ORAL | Status: AC
  Administered 2021-10-03: 50 mg via ORAL
  Filled 2021-10-03: qty 2

## 2021-10-03 MED ORDER — DARATUMUMAB-HYALURONIDASE-FIHJ 1800-30000 MG-UT/15ML ~~LOC~~ SOLN
1800.0000 mg | Freq: Once | SUBCUTANEOUS | Status: AC
  Administered 2021-10-03: 1800 mg via SUBCUTANEOUS
  Filled 2021-10-03: qty 15

## 2021-10-03 MED ORDER — DEXAMETHASONE 4 MG PO TABS
20.0000 mg | ORAL_TABLET | Freq: Once | ORAL | Status: AC
  Administered 2021-10-03: 20 mg via ORAL
  Filled 2021-10-03: qty 5

## 2021-10-03 NOTE — Patient Instructions (Signed)
[facility]   Discharge Instructions: Thank you for choosing [facility] to provide your oncology and hematology care.   If you have a lab appointment with the Cancer Center, please go directly to the Cancer Center and check in at the registration area.   Wear comfortable clothing and clothing appropriate for easy access to any Portacath or PICC line.   We strive to give you quality time with your provider. You may need to reschedule your appointment if you arrive late (15 or more minutes).  Arriving late affects you and other patients whose appointments are after yours.  Also, if you miss three or more appointments without notifying the office, you may be dismissed from the clinic at the provider's discretion.      For prescription refill requests, have your pharmacy contact our office and allow 72 hours for refills to be completed.    Today you received the following chemotherapy and/or immunotherapy agents Darzalex Faspro.      To help prevent nausea and vomiting after your treatment, we encourage you to take your nausea medication as directed.  BELOW ARE SYMPTOMS THAT SHOULD BE REPORTED IMMEDIATELY: *FEVER GREATER THAN 100.4 F (38 C) OR HIGHER *CHILLS OR SWEATING *NAUSEA AND VOMITING THAT IS NOT CONTROLLED WITH YOUR NAUSEA MEDICATION *UNUSUAL SHORTNESS OF BREATH *UNUSUAL BRUISING OR BLEEDING *URINARY PROBLEMS (pain or burning when urinating, or frequent urination) *BOWEL PROBLEMS (unusual diarrhea, constipation, pain near the anus) TENDERNESS IN MOUTH AND THROAT WITH OR WITHOUT PRESENCE OF ULCERS (sore throat, sores in mouth, or a toothache) UNUSUAL RASH, SWELLING OR PAIN  UNUSUAL VAGINAL DISCHARGE OR ITCHING   Items with * indicate a potential emergency and should be followed up as soon as possible or go to the Emergency Department if any problems should occur.  Please show the CHEMOTHERAPY ALERT CARD or IMMUNOTHERAPY ALERT CARD at  check-in to the Emergency Department and triage nurse.  Should you have questions after your visit or need to cancel or reschedule your appointment, please contact [facility]  Dept: [phone]  and follow the prompts.  Office hours are 8:00 a.m. to 4:30 p.m. Monday - Friday. Please note that voicemails left after 4:00 p.m. may not be returned until the following business day.  We are closed weekends and major holidays. You have access to a nurse at all times for urgent questions. Please call the main number to the clinic Dept: [phone] and follow the prompts.   For any non-urgent questions, you may also contact your provider using MyChart. We now offer e-Visits for anyone 18 and older to request care online for non-urgent symptoms. For details visit [email or web address].   Also download the MyChart app! Go to the app store, search "MyChart", open the app, select [facility], and log in with your MyChart username and password.  Masks are optional in the cancer centers. If you would like for your care team to wear a mask while they are taking care of you, please let them know. For doctor visits, patients may have with them one support person who is at least 50 years old. At this time, visitors are not allowed in the infusion area.  Daratumumab; Hyaluronidase Injection What is this medication? DARATUMUMAB; HYALURONIDASE (dar a toom ue mab / hye al ur ON i dase) is a monoclonal antibody. Hyaluronidase is used to improve the effects of daratumumab. It treats certain types of cancer. Some of the cancers treated are multiple myeloma   and light-chain amyloidosis. This medicine may be used for other purposes; ask your health care provider or pharmacist if you have questions. COMMON BRAND NAME(S): DARZALEX FASPRO What should I tell my care team before I take this medication? They need to know if you have any of these conditions: heart disease infection especially a viral  infection such as chickenpox, cold sores, herpes, or hepatitis B lung or breathing disease an unusual or allergic reaction to daratumumab, hyaluronidase, other medicines, foods, dyes, or preservatives pregnant or trying to get pregnant breast-feeding How should I use this medication? This medicine is for injection under the skin. It is given by a health care professional in a hospital or clinic setting. Talk to your pediatrician regarding the use of this medicine in children. Special care may be needed. Overdosage: If you think you have taken too much of this medicine contact a poison control center or emergency room at once. NOTE: This medicine is only for you. Do not share this medicine with others. What if I miss a dose? Keep appointments for follow-up doses as directed. It is important not to miss your dose. Call your doctor or health care professional if you are unable to keep an appointment. What may interact with this medication? Interactions have not been studied. This list may not describe all possible interactions. Give your health care provider a list of all the medicines, herbs, non-prescription drugs, or dietary supplements you use. Also tell them if you smoke, drink alcohol, or use illegal drugs. Some items may interact with your medicine. What should I watch for while using this medication? Your condition will be monitored carefully while you are receiving this medicine. This medicine can cause serious allergic reactions. To reduce your risk, your health care provider may give you other medicine to take before receiving this one. Be sure to follow the directions from your health care provider. This medicine can affect the results of blood tests to match your blood type. These changes can last for up to 6 months after the final dose. Your healthcare provider will do blood tests to match your blood type before you start treatment. Tell all of your healthcare providers that you are  being treated with this medicine before receiving a blood transfusion. This medicine can affect the results of some tests used to determine treatment response; extra tests may be needed to evaluate response. Do not become pregnant while taking this medicine or for 3 months after stopping it. Women should inform their health care provider if they wish to become pregnant or think they might be pregnant. There is a potential for serious side effects to an unborn child. Talk to your health care provider for more information. Do not breast-feed an infant while taking this medicine. What side effects may I notice from receiving this medication? Side effects that you should report to your care team as soon as possible: Allergic reactions--skin rash, itching or hives, swelling of the face, lips, or tongue Blood clot--chest pain, shortness of breath, pain, swelling or warmth in the leg Blurred vision Fast, irregular heartbeat Infection--fever, chills, cough, sore throat, pain or trouble passing urine Injection reactions--dizziness, fast heartbeat, feeling faint or lightheaded, falls, headache, increase in blood pressure, nausea, vomiting, or wheezing or trouble breathing with loud or whistling sounds Low red blood cell counts--trouble breathing, feeling faint, lightheaded or falls, unusually weak or tired Unusual bleeding or bruising Side effects that usually do not require medical attention (report these to your care team if they   continue or are bothersome): Back pain Constipation Diarrhea Pain, tingling, numbness in the hands or feet Pain, redness, or irritation at site where injected Muscle cramp or pain Swelling of the ankles, feet, hands Tiredness Trouble sleeping This list may not describe all possible side effects. Call your doctor for medical advice about side effects. You may report side effects to FDA at 1-800-FDA-1088. Where should I keep my medication? This drug is given in a hospital or  clinic and will not be stored at home. NOTE: This sheet is a summary. It may not cover all possible information. If you have questions about this medicine, talk to your doctor, pharmacist, or health care provider.  2023 Elsevier/Gold Standard (2021-02-04 00:00:00)   

## 2021-10-03 NOTE — Progress Notes (Signed)
Patient didn't take Dex at home today. MD would like for patient to have Dex '20mg'$  weekly. Order entered for Dex '20mg'$  po.  Acquanetta Belling, RPH, BCPS, BCOP 10/03/2021 11:58 AM

## 2021-10-06 NOTE — Telephone Encounter (Signed)
Oral Chemotherapy Pharmacist Encounter  [name] received his Pomalyst on 10/05/21 and began taking his medication on that day.   Patient Education I spoke with patient for overview of new oral chemotherapy medication: Pomlyst (pomalidomide) for the treatment of multiple myeloma in conjunction with datatumumab and dexamethsone, planned duration until disease control or unacceptable drug toxicity. Patient previously was on lenalidomide but has developed a rash while on treatment. The plan is now switch him to pomalidomide and see is this is better tolerate.   Counseled patient on administration, dosing, side effects, monitoring, drug-food interactions, safe handling, storage, and disposal. Patient will take 1 capsule (4 mg total) by mouth daily. Take for 21 days on and 7 days off. Repeat every 28 days.  Side effects include but not limited to: constipation or diarrhea, fatigue, nausea, decreased wbc/hgb/plt.    Reviewed with patient importance of keeping a medication schedule and plan for any missed doses.  After discussion with patient no patient barriers to medication adherence identified.   Mr. Niven voiced understanding and appreciation. All questions answered. Medication handout provided.  Provided patient with Oral [facility] phone number. Patient knows to call the office with questions or concerns. Oral Chemotherapy Navigation Clinic will continue to follow.  Darl Pikes, PharmD, BCPS, BCOP, CPP Hematology/Oncology Clinical Pharmacist Practitioner [place]/DB/AP [facility] [phone]  10/06/2021 2:33 PM

## 2021-10-10 ENCOUNTER — Inpatient Hospital Stay: Payer: BLUE CROSS/BLUE SHIELD | Admitting: Nurse Practitioner

## 2021-10-10 ENCOUNTER — Inpatient Hospital Stay: Payer: BLUE CROSS/BLUE SHIELD

## 2021-10-10 ENCOUNTER — Other Ambulatory Visit: Payer: Self-pay

## 2021-10-14 ENCOUNTER — Encounter: Payer: Self-pay | Admitting: *Deleted

## 2021-10-14 NOTE — Progress Notes (Signed)
Received request for medical records from [place] for [place]. This was emailed to [place].

## 2021-10-16 ENCOUNTER — Other Ambulatory Visit: Payer: Self-pay | Admitting: Oncology

## 2021-10-17 ENCOUNTER — Encounter: Payer: Self-pay | Admitting: Nurse Practitioner

## 2021-10-17 ENCOUNTER — Inpatient Hospital Stay: Payer: BLUE CROSS/BLUE SHIELD

## 2021-10-17 ENCOUNTER — Inpatient Hospital Stay (HOSPITAL_BASED_OUTPATIENT_CLINIC_OR_DEPARTMENT_OTHER): Payer: BLUE CROSS/BLUE SHIELD | Admitting: Nurse Practitioner

## 2021-10-17 VITALS — BP 126/78 | HR 86 | Temp 98.1°F | Resp 18 | Ht 68.0 in | Wt 177.0 lb

## 2021-10-17 DIAGNOSIS — C9000 Multiple myeloma not having achieved remission: Secondary | ICD-10-CM

## 2021-10-17 LAB — CBC WITH DIFFERENTIAL (CANCER CENTER ONLY)
Abs Immature Granulocytes: 0.06 10*3/uL (ref 0.00–0.07)
Basophils Absolute: 0.1 10*3/uL (ref 0.0–0.1)
Basophils Relative: 0 %
Eosinophils Absolute: 0.5 10*3/uL (ref 0.0–0.5)
Eosinophils Relative: 4 %
HCT: 41.3 % (ref 39.0–52.0)
Hemoglobin: 14.4 g/dL (ref 13.0–17.0)
Immature Granulocytes: 0 %
Lymphocytes Relative: 8 %
Lymphs Abs: 1 10*3/uL (ref 0.7–4.0)
MCH: 33.8 pg (ref 26.0–34.0)
MCHC: 34.9 g/dL (ref 30.0–36.0)
MCV: 96.9 fL (ref 80.0–100.0)
Monocytes Absolute: 2.4 10*3/uL — ABNORMAL HIGH (ref 0.1–1.0)
Monocytes Relative: 18 %
Neutro Abs: 9.3 10*3/uL — ABNORMAL HIGH (ref 1.7–7.7)
Neutrophils Relative %: 70 %
Platelet Count: 207 10*3/uL (ref 150–400)
RBC: 4.26 MIL/uL (ref 4.22–5.81)
RDW: 15.8 % — ABNORMAL HIGH (ref 11.5–15.5)
WBC Count: 13.4 10*3/uL — ABNORMAL HIGH (ref 4.0–10.5)
nRBC: 0 % (ref 0.0–0.2)

## 2021-10-17 LAB — CMP (CANCER CENTER ONLY)
ALT: 8 U/L (ref 0–44)
AST: 8 U/L — ABNORMAL LOW (ref 15–41)
Albumin: 4.3 g/dL (ref 3.5–5.0)
Alkaline Phosphatase: 51 U/L (ref 38–126)
Anion gap: 11 (ref 5–15)
BUN: 15 mg/dL (ref 6–20)
CO2: 24 mmol/L (ref 22–32)
Calcium: 9.7 mg/dL (ref 8.9–10.3)
Chloride: 102 mmol/L (ref 98–111)
Creatinine: 1.11 mg/dL (ref 0.61–1.24)
GFR, Estimated: >60 mL/min (ref >60)
Glucose, Bld: 96 mg/dL (ref 70–99)
Potassium: 3.6 mmol/L (ref 3.5–5.1)
Sodium: 137 mmol/L (ref 135–145)
Total Bilirubin: 0.4 mg/dL (ref 0.3–1.2)
Total Protein: 6.5 g/dL (ref 6.5–8.1)

## 2021-10-17 MED ORDER — DARATUMUMAB-HYALURONIDASE-FIHJ 1800-30000 MG-UT/15ML ~~LOC~~ SOLN
1800.0000 mg | Freq: Once | SUBCUTANEOUS | Status: AC
  Administered 2021-10-17: 1800 mg via SUBCUTANEOUS
  Filled 2021-10-17: qty 15

## 2021-10-17 MED ORDER — ACETAMINOPHEN 325 MG PO TABS
650.0000 mg | ORAL_TABLET | Freq: Once | ORAL | Status: AC
  Administered 2021-10-17: 650 mg via ORAL
  Filled 2021-10-17: qty 2

## 2021-10-17 MED ORDER — DIPHENHYDRAMINE HCL 25 MG PO CAPS
50.0000 mg | ORAL_CAPSULE | Freq: Once | ORAL | Status: AC
  Administered 2021-10-17: 50 mg via ORAL
  Filled 2021-10-17: qty 2

## 2021-10-17 NOTE — Progress Notes (Signed)
[facility] OFFICE PROGRESS NOTE   Diagnosis: Multiple myeloma  INTERVAL HISTORY:   Jordan King returns as scheduled.  He began Pomalidomide, daratumumab and Decadron beginning 10/03/2021.  7 to 10 days at the beginning Pomalidomide he developed pruritus and subsequent rash most pronounced on his buttocks, also present upper posterior arms and back.  He has not noticed a rash on his legs.  He has periodic nausea.  No mouth sores.  No diarrhea.  He reports pain region of the left popliteal fossa.  Objective:  Vital signs in last 24 hours:  Blood pressure 126/78, pulse 86, temperature 98.1 F (36.7 C), temperature source Oral, resp. rate 18, height 5' 8" (1.727 m), weight 177 lb (80.3 kg), SpO2 98 %.    HEENT: No thrush or ulcers. Resp: Lungs clear bilaterally. Cardio: Regular rate and rhythm. GI: No hepatosplenomegaly. Vascular: No leg edema.  No palpable cord.  Nontender at the left popliteal fossa. Neuro: Alert and oriented. Skin: Erythematous raised rash posterior upper arms, upper legs and buttocks.  Faint erythematous rash at the lower back.   Lab Results:  Lab Results  Component Value Date   WBC 13.4 (H) 10/17/2021   HGB 14.4 10/17/2021   HCT 41.3 10/17/2021   MCV 96.9 10/17/2021   PLT 207 10/17/2021   NEUTROABS 9.3 (H) 10/17/2021    Imaging:  No results found.  Medications: I have reviewed the patient's current medications.  Assessment/Plan: Multiple myeloma -05/22/2021-kappa free light chain 11.3, lambda free light chain 17,028 -25 g proteinuria, 20 g lambda light chains -Serum immunofixation 05/22/2021-monoclonal lambda light chains -Bone marrow biopsy 05/23/2021-hypercellular marrow involved by plasma cell myeloma, 80-90% plasma cells on the bone marrow biopsy, diminished iron stains -Cytogenetics-45 X, minus Y, t11:14 (11 q13) -Cycle 1 Velcade/Decadron 05/23/2021 (Decadron given 05/22/2021) -Cycle 1 DRVd 06/13/2021, treatment held 06/20/2021 due to a  rash, Revlimid resumed 06/28/2021, daratumumab resumed 07/04/2021 Revlimid resumed 06/28/2021 Cycle 2 daratumumab/Revlimid/Decadron/Velcade 07/18/2021 (Revlimid started 07/19/2021, placed on hold 07/25/2021 secondary to a rash-last taken 07/24/2021) Treatment continued with weekly Velcade/Decadron and daratumumab Treatment changed to pomalidomide, daratumumab, Decadron 10/03/2021 Pomalidomide placed on hold 10/17/2021 due to a rash   Renal failure secondary to #1-improved Hypercalcemia-status post treat with calcitonin, pamidronate 05/22/2021-resolved Lytic bone lesions Anemia Thrombocytopenia Hypocalcemia-IV calcium gluconate 05/30/2021, resolved Rash 06/20/2021, appears consistent with a drug rash, recurrent rash 07/24/2021-Revlimid placed on hold; recurrent rash 10/17/2021    Disposition: Jordan King began treatment with Pomalidomide/daratumumab/Decadron 10/03/2021.  He has developed a rash most likely related to Pomalidomide.  He will discontinue Pomalidomide.  Dr. Sherrill discussed treatment options to potentially include Carfilzomib.  He will receive daratumumab today as scheduled.  He will return for follow-up in 1 week with reevaluation of the rash, decision on treatment going forward.  Patient seen with Dr. Sherrill.    Lisa Thomas ANP/GNP-BC   10/17/2021  12:02 PM This was a shared visit with Lisa Thomas.  Jordan King was interviewed and examined.  He has developed a significant maculopapular rash since beginning pomalidomide.  Pomalidomide will be placed on hold.  I will contact Dr. McKay to discuss treatment options.  Treatment will likely be changed to Cytoxan/Velcade/Decadron versus a carfilzomib-based regimen.  Jordan King indicated he is reluctant to undergo Port-A-Cath placement.  He reported discomfort in the left popliteal fossa.  There is no erythema, swelling, tenderness, or palpable cord on exam today.  He will call for persistent discomfort swelling in the left leg.  I was present for    greater than 50% of today's visit.  I performed medical decision making.  Julieanne Manson, MD

## 2021-10-17 NOTE — Progress Notes (Signed)
Patient seen by Lisa Thomas NP today  Vitals are within treatment parameters.  Labs reviewed by Lisa Thomas NP and are within treatment parameters.  Per physician team, patient is ready for treatment and there are NO modifications to the treatment plan.     

## 2021-10-17 NOTE — Patient Instructions (Signed)
[facility]   Discharge Instructions: Thank you for choosing [facility] to provide your oncology and hematology care.   If you have a lab appointment with the Cancer Center, please go directly to the Cancer Center and check in at the registration area.   Wear comfortable clothing and clothing appropriate for easy access to any Portacath or PICC line.   We strive to give you quality time with your provider. You may need to reschedule your appointment if you arrive late (15 or more minutes).  Arriving late affects you and other patients whose appointments are after yours.  Also, if you miss three or more appointments without notifying the office, you may be dismissed from the clinic at the provider's discretion.      For prescription refill requests, have your pharmacy contact our office and allow 72 hours for refills to be completed.    Today you received the following chemotherapy and/or immunotherapy agents Darzalex Faspro.      To help prevent nausea and vomiting after your treatment, we encourage you to take your nausea medication as directed.  BELOW ARE SYMPTOMS THAT SHOULD BE REPORTED IMMEDIATELY: *FEVER GREATER THAN 100.4 F (38 C) OR HIGHER *CHILLS OR SWEATING *NAUSEA AND VOMITING THAT IS NOT CONTROLLED WITH YOUR NAUSEA MEDICATION *UNUSUAL SHORTNESS OF BREATH *UNUSUAL BRUISING OR BLEEDING *URINARY PROBLEMS (pain or burning when urinating, or frequent urination) *BOWEL PROBLEMS (unusual diarrhea, constipation, pain near the anus) TENDERNESS IN MOUTH AND THROAT WITH OR WITHOUT PRESENCE OF ULCERS (sore throat, sores in mouth, or a toothache) UNUSUAL RASH, SWELLING OR PAIN  UNUSUAL VAGINAL DISCHARGE OR ITCHING   Items with * indicate a potential emergency and should be followed up as soon as possible or go to the Emergency Department if any problems should occur.  Please show the CHEMOTHERAPY ALERT CARD or IMMUNOTHERAPY ALERT CARD at  check-in to the Emergency Department and triage nurse.  Should you have questions after your visit or need to cancel or reschedule your appointment, please contact [facility]  Dept: [phone]  and follow the prompts.  Office hours are 8:00 a.m. to 4:30 p.m. Monday - Friday. Please note that voicemails left after 4:00 p.m. may not be returned until the following business day.  We are closed weekends and major holidays. You have access to a nurse at all times for urgent questions. Please call the main number to the clinic Dept: [phone] and follow the prompts.   For any non-urgent questions, you may also contact your provider using MyChart. We now offer e-Visits for anyone 18 and older to request care online for non-urgent symptoms. For details visit [email or web address].   Also download the MyChart app! Go to the app store, search "MyChart", open the app, select [facility], and log in with your MyChart username and password.  Masks are optional in the cancer centers. If you would like for your care team to wear a mask while they are taking care of you, please let them know. For doctor visits, patients may have with them one support person who is at least 50 years old. At this time, visitors are not allowed in the infusion area.  Daratumumab; Hyaluronidase Injection What is this medication? DARATUMUMAB; HYALURONIDASE (dar a toom ue mab / hye al ur ON i dase) is a monoclonal antibody. Hyaluronidase is used to improve the effects of daratumumab. It treats certain types of cancer. Some of the cancers treated are multiple myeloma   and light-chain amyloidosis. This medicine may be used for other purposes; ask your health care provider or pharmacist if you have questions. COMMON BRAND NAME(S): DARZALEX FASPRO What should I tell my care team before I take this medication? They need to know if you have any of these conditions: heart disease infection especially a viral  infection such as chickenpox, cold sores, herpes, or hepatitis B lung or breathing disease an unusual or allergic reaction to daratumumab, hyaluronidase, other medicines, foods, dyes, or preservatives pregnant or trying to get pregnant breast-feeding How should I use this medication? This medicine is for injection under the skin. It is given by a health care professional in a hospital or clinic setting. Talk to your pediatrician regarding the use of this medicine in children. Special care may be needed. Overdosage: If you think you have taken too much of this medicine contact a poison control center or emergency room at once. NOTE: This medicine is only for you. Do not share this medicine with others. What if I miss a dose? Keep appointments for follow-up doses as directed. It is important not to miss your dose. Call your doctor or health care professional if you are unable to keep an appointment. What may interact with this medication? Interactions have not been studied. This list may not describe all possible interactions. Give your health care provider a list of all the medicines, herbs, non-prescription drugs, or dietary supplements you use. Also tell them if you smoke, drink alcohol, or use illegal drugs. Some items may interact with your medicine. What should I watch for while using this medication? Your condition will be monitored carefully while you are receiving this medicine. This medicine can cause serious allergic reactions. To reduce your risk, your health care provider may give you other medicine to take before receiving this one. Be sure to follow the directions from your health care provider. This medicine can affect the results of blood tests to match your blood type. These changes can last for up to 6 months after the final dose. Your healthcare provider will do blood tests to match your blood type before you start treatment. Tell all of your healthcare providers that you are  being treated with this medicine before receiving a blood transfusion. This medicine can affect the results of some tests used to determine treatment response; extra tests may be needed to evaluate response. Do not become pregnant while taking this medicine or for 3 months after stopping it. Women should inform their health care provider if they wish to become pregnant or think they might be pregnant. There is a potential for serious side effects to an unborn child. Talk to your health care provider for more information. Do not breast-feed an infant while taking this medicine. What side effects may I notice from receiving this medication? Side effects that you should report to your care team as soon as possible: Allergic reactions--skin rash, itching or hives, swelling of the face, lips, or tongue Blood clot--chest pain, shortness of breath, pain, swelling or warmth in the leg Blurred vision Fast, irregular heartbeat Infection--fever, chills, cough, sore throat, pain or trouble passing urine Injection reactions--dizziness, fast heartbeat, feeling faint or lightheaded, falls, headache, increase in blood pressure, nausea, vomiting, or wheezing or trouble breathing with loud or whistling sounds Low red blood cell counts--trouble breathing, feeling faint, lightheaded or falls, unusually weak or tired Unusual bleeding or bruising Side effects that usually do not require medical attention (report these to your care team if they   continue or are bothersome): Back pain Constipation Diarrhea Pain, tingling, numbness in the hands or feet Pain, redness, or irritation at site where injected Muscle cramp or pain Swelling of the ankles, feet, hands Tiredness Trouble sleeping This list may not describe all possible side effects. Call your doctor for medical advice about side effects. You may report side effects to FDA at 1-800-FDA-1088. Where should I keep my medication? This drug is given in a hospital or  clinic and will not be stored at home. NOTE: This sheet is a summary. It may not cover all possible information. If you have questions about this medicine, talk to your doctor, pharmacist, or health care provider.  2023 Elsevier/Gold Standard (2021-02-04 00:00:00)   

## 2021-10-18 ENCOUNTER — Other Ambulatory Visit: Payer: Self-pay

## 2021-10-18 ENCOUNTER — Encounter: Payer: Self-pay | Admitting: Oncology

## 2021-10-18 NOTE — Telephone Encounter (Signed)
Medications review

## 2021-10-19 ENCOUNTER — Other Ambulatory Visit: Payer: Self-pay | Admitting: Nurse Practitioner

## 2021-10-19 ENCOUNTER — Telehealth: Payer: Self-pay | Admitting: Nurse Practitioner

## 2021-10-19 DIAGNOSIS — C9000 Multiple myeloma not having achieved remission: Secondary | ICD-10-CM

## 2021-10-19 NOTE — Telephone Encounter (Signed)
Jordan King made aware that Dr. Benay Spice has spoken with Dr. Feliciana Rossetti, recommendation is to resume Pomalidomide at a lower dose once the rash has improved/resolved, continue daratumumab/dexamethasone.  We will cancel appointments on 10/24/2021 and see him in follow-up on 10/31/2021.  He reports the rash is beginning to improve.

## 2021-10-20 ENCOUNTER — Telehealth: Payer: Self-pay | Admitting: Oncology

## 2021-10-20 ENCOUNTER — Other Ambulatory Visit: Payer: Self-pay | Admitting: *Deleted

## 2021-10-20 MED ORDER — POMALIDOMIDE 2 MG PO CAPS: ORAL_CAPSULE | ORAL | 0 refills | Status: DC

## 2021-10-20 NOTE — Telephone Encounter (Signed)
Attempted to contact patient in regards to schedule change per schedule message. No answer so voicemail was left for patient to call back

## 2021-10-21 ENCOUNTER — Other Ambulatory Visit: Payer: Self-pay

## 2021-10-24 ENCOUNTER — Other Ambulatory Visit: Payer: Self-pay | Admitting: Nurse Practitioner

## 2021-10-24 ENCOUNTER — Ambulatory Visit: Payer: BLUE CROSS/BLUE SHIELD | Admitting: Nurse Practitioner

## 2021-10-24 ENCOUNTER — Ambulatory Visit: Payer: BLUE CROSS/BLUE SHIELD

## 2021-10-24 ENCOUNTER — Other Ambulatory Visit: Payer: BLUE CROSS/BLUE SHIELD

## 2021-10-24 DIAGNOSIS — C9000 Multiple myeloma not having achieved remission: Secondary | ICD-10-CM

## 2021-10-24 MED ORDER — OXYCODONE-ACETAMINOPHEN 5-325 MG PO TABS: 1.0000 | ORAL_TABLET | Freq: Four times a day (QID) | ORAL | 0 refills | Status: DC | PRN

## 2021-10-26 ENCOUNTER — Other Ambulatory Visit: Payer: Self-pay | Admitting: Nurse Practitioner

## 2021-10-26 ENCOUNTER — Encounter: Payer: Self-pay | Admitting: Nurse Practitioner

## 2021-10-26 DIAGNOSIS — C9000 Multiple myeloma not having achieved remission: Secondary | ICD-10-CM

## 2021-10-26 MED ORDER — OXYCODONE-ACETAMINOPHEN 5-325 MG PO TABS: 1.0000 | ORAL_TABLET | Freq: Four times a day (QID) | ORAL | 0 refills | Status: DC | PRN

## 2021-10-31 ENCOUNTER — Inpatient Hospital Stay: Payer: BLUE CROSS/BLUE SHIELD | Attending: Oncology

## 2021-10-31 ENCOUNTER — Encounter: Payer: Self-pay | Admitting: *Deleted

## 2021-10-31 ENCOUNTER — Other Ambulatory Visit: Payer: Self-pay | Admitting: Oncology

## 2021-10-31 ENCOUNTER — Encounter: Payer: Self-pay | Admitting: Nurse Practitioner

## 2021-10-31 ENCOUNTER — Inpatient Hospital Stay (HOSPITAL_BASED_OUTPATIENT_CLINIC_OR_DEPARTMENT_OTHER): Payer: BLUE CROSS/BLUE SHIELD | Admitting: Nurse Practitioner

## 2021-10-31 ENCOUNTER — Inpatient Hospital Stay: Payer: BLUE CROSS/BLUE SHIELD

## 2021-10-31 VITALS — BP 117/79 | HR 71 | Temp 98.1°F | Resp 20 | Ht 68.0 in | Wt 175.0 lb

## 2021-10-31 DIAGNOSIS — D649 Anemia, unspecified: Secondary | ICD-10-CM | POA: Diagnosis not present

## 2021-10-31 DIAGNOSIS — Z79899 Other long term (current) drug therapy: Secondary | ICD-10-CM | POA: Insufficient documentation

## 2021-10-31 DIAGNOSIS — C9000 Multiple myeloma not having achieved remission: Secondary | ICD-10-CM | POA: Insufficient documentation

## 2021-10-31 DIAGNOSIS — Z5112 Encounter for antineoplastic immunotherapy: Secondary | ICD-10-CM | POA: Insufficient documentation

## 2021-10-31 DIAGNOSIS — D696 Thrombocytopenia, unspecified: Secondary | ICD-10-CM | POA: Insufficient documentation

## 2021-10-31 LAB — CBC WITH DIFFERENTIAL (CANCER CENTER ONLY)
Abs Immature Granulocytes: 0.04 10*3/uL (ref 0.00–0.07)
Basophils Absolute: 0.1 10*3/uL (ref 0.0–0.1)
Basophils Relative: 1 %
Eosinophils Absolute: 0.5 10*3/uL (ref 0.0–0.5)
Eosinophils Relative: 4 %
HCT: 40.3 % (ref 39.0–52.0)
Hemoglobin: 14.1 g/dL (ref 13.0–17.0)
Immature Granulocytes: 0 %
Lymphocytes Relative: 25 %
Lymphs Abs: 3 10*3/uL (ref 0.7–4.0)
MCH: 33.8 pg (ref 26.0–34.0)
MCHC: 35 g/dL (ref 30.0–36.0)
MCV: 96.6 fL (ref 80.0–100.0)
Monocytes Absolute: 1 10*3/uL (ref 0.1–1.0)
Monocytes Relative: 8 %
Neutro Abs: 7.3 10*3/uL (ref 1.7–7.7)
Neutrophils Relative %: 62 %
Platelet Count: 220 10*3/uL (ref 150–400)
RBC: 4.17 MIL/uL — ABNORMAL LOW (ref 4.22–5.81)
RDW: 15.3 % (ref 11.5–15.5)
WBC Count: 11.9 10*3/uL — ABNORMAL HIGH (ref 4.0–10.5)
nRBC: 0 % (ref 0.0–0.2)

## 2021-10-31 LAB — CMP (CANCER CENTER ONLY)
ALT: 10 U/L (ref 0–44)
AST: 8 U/L — ABNORMAL LOW (ref 15–41)
Albumin: 4.2 g/dL (ref 3.5–5.0)
Alkaline Phosphatase: 60 U/L (ref 38–126)
Anion gap: 11 (ref 5–15)
BUN: 20 mg/dL (ref 6–20)
CO2: 23 mmol/L (ref 22–32)
Calcium: 9.6 mg/dL (ref 8.9–10.3)
Chloride: 101 mmol/L (ref 98–111)
Creatinine: 1.19 mg/dL (ref 0.61–1.24)
GFR, Estimated: >60 mL/min (ref >60)
Glucose, Bld: 122 mg/dL — ABNORMAL HIGH (ref 70–99)
Potassium: 3.6 mmol/L (ref 3.5–5.1)
Sodium: 135 mmol/L (ref 135–145)
Total Bilirubin: 0.2 mg/dL — ABNORMAL LOW (ref 0.3–1.2)
Total Protein: 6.6 g/dL (ref 6.5–8.1)

## 2021-10-31 MED ORDER — ACETAMINOPHEN 325 MG PO TABS
650.0000 mg | ORAL_TABLET | Freq: Once | ORAL | Status: AC
  Administered 2021-10-31: 650 mg via ORAL

## 2021-10-31 MED ORDER — DARATUMUMAB-HYALURONIDASE-FIHJ 1800-30000 MG-UT/15ML ~~LOC~~ SOLN
1800.0000 mg | Freq: Once | SUBCUTANEOUS | Status: AC
  Administered 2021-10-31: 1800 mg via SUBCUTANEOUS
  Filled 2021-10-31: qty 15

## 2021-10-31 MED ORDER — DIPHENHYDRAMINE HCL 25 MG PO CAPS
50.0000 mg | ORAL_CAPSULE | Freq: Once | ORAL | Status: AC
  Administered 2021-10-31: 50 mg via ORAL

## 2021-10-31 NOTE — Patient Instructions (Signed)
[facility]   Discharge Instructions: Thank you for choosing [facility] to provide your oncology and hematology care.   If you have a lab appointment with the Cancer Center, please go directly to the Cancer Center and check in at the registration area.   Wear comfortable clothing and clothing appropriate for easy access to any Portacath or PICC line.   We strive to give you quality time with your provider. You may need to reschedule your appointment if you arrive late (15 or more minutes).  Arriving late affects you and other patients whose appointments are after yours.  Also, if you miss three or more appointments without notifying the office, you may be dismissed from the clinic at the provider's discretion.      For prescription refill requests, have your pharmacy contact our office and allow 72 hours for refills to be completed.    Today you received the following chemotherapy and/or immunotherapy agents Darzalex Faspro.      To help prevent nausea and vomiting after your treatment, we encourage you to take your nausea medication as directed.  BELOW ARE SYMPTOMS THAT SHOULD BE REPORTED IMMEDIATELY: *FEVER GREATER THAN 100.4 F (38 C) OR HIGHER *CHILLS OR SWEATING *NAUSEA AND VOMITING THAT IS NOT CONTROLLED WITH YOUR NAUSEA MEDICATION *UNUSUAL SHORTNESS OF BREATH *UNUSUAL BRUISING OR BLEEDING *URINARY PROBLEMS (pain or burning when urinating, or frequent urination) *BOWEL PROBLEMS (unusual diarrhea, constipation, pain near the anus) TENDERNESS IN MOUTH AND THROAT WITH OR WITHOUT PRESENCE OF ULCERS (sore throat, sores in mouth, or a toothache) UNUSUAL RASH, SWELLING OR PAIN  UNUSUAL VAGINAL DISCHARGE OR ITCHING   Items with * indicate a potential emergency and should be followed up as soon as possible or go to the Emergency Department if any problems should occur.  Please show the CHEMOTHERAPY ALERT CARD or IMMUNOTHERAPY ALERT CARD at  check-in to the Emergency Department and triage nurse.  Should you have questions after your visit or need to cancel or reschedule your appointment, please contact [facility]  Dept: [phone]  and follow the prompts.  Office hours are 8:00 a.m. to 4:30 p.m. Monday - Friday. Please note that voicemails left after 4:00 p.m. may not be returned until the following business day.  We are closed weekends and major holidays. You have access to a nurse at all times for urgent questions. Please call the main number to the clinic Dept: [phone] and follow the prompts.   For any non-urgent questions, you may also contact your provider using MyChart. We now offer e-Visits for anyone 18 and older to request care online for non-urgent symptoms. For details visit [email or web address].   Also download the MyChart app! Go to the app store, search "MyChart", open the app, select [facility], and log in with your MyChart username and password.  Masks are optional in the cancer centers. If you would like for your care team to wear a mask while they are taking care of you, please let them know. For doctor visits, patients may have with them one support person who is at least 50 years old. At this time, visitors are not allowed in the infusion area.  Daratumumab; Hyaluronidase Injection What is this medication? DARATUMUMAB; HYALURONIDASE (dar a toom ue mab / hye al ur ON i dase) is a monoclonal antibody. Hyaluronidase is used to improve the effects of daratumumab. It treats certain types of cancer. Some of the cancers treated are multiple myeloma   and light-chain amyloidosis. This medicine may be used for other purposes; ask your health care provider or pharmacist if you have questions. COMMON BRAND NAME(S): DARZALEX FASPRO What should I tell my care team before I take this medication? They need to know if you have any of these conditions: heart disease infection especially a viral  infection such as chickenpox, cold sores, herpes, or hepatitis B lung or breathing disease an unusual or allergic reaction to daratumumab, hyaluronidase, other medicines, foods, dyes, or preservatives pregnant or trying to get pregnant breast-feeding How should I use this medication? This medicine is for injection under the skin. It is given by a health care professional in a hospital or clinic setting. Talk to your pediatrician regarding the use of this medicine in children. Special care may be needed. Overdosage: If you think you have taken too much of this medicine contact a poison control center or emergency room at once. NOTE: This medicine is only for you. Do not share this medicine with others. What if I miss a dose? Keep appointments for follow-up doses as directed. It is important not to miss your dose. Call your doctor or health care professional if you are unable to keep an appointment. What may interact with this medication? Interactions have not been studied. This list may not describe all possible interactions. Give your health care provider a list of all the medicines, herbs, non-prescription drugs, or dietary supplements you use. Also tell them if you smoke, drink alcohol, or use illegal drugs. Some items may interact with your medicine. What should I watch for while using this medication? Your condition will be monitored carefully while you are receiving this medicine. This medicine can cause serious allergic reactions. To reduce your risk, your health care provider may give you other medicine to take before receiving this one. Be sure to follow the directions from your health care provider. This medicine can affect the results of blood tests to match your blood type. These changes can last for up to 6 months after the final dose. Your healthcare provider will do blood tests to match your blood type before you start treatment. Tell all of your healthcare providers that you are  being treated with this medicine before receiving a blood transfusion. This medicine can affect the results of some tests used to determine treatment response; extra tests may be needed to evaluate response. Do not become pregnant while taking this medicine or for 3 months after stopping it. Women should inform their health care provider if they wish to become pregnant or think they might be pregnant. There is a potential for serious side effects to an unborn child. Talk to your health care provider for more information. Do not breast-feed an infant while taking this medicine. What side effects may I notice from receiving this medication? Side effects that you should report to your care team as soon as possible: Allergic reactions--skin rash, itching or hives, swelling of the face, lips, or tongue Blood clot--chest pain, shortness of breath, pain, swelling or warmth in the leg Blurred vision Fast, irregular heartbeat Infection--fever, chills, cough, sore throat, pain or trouble passing urine Injection reactions--dizziness, fast heartbeat, feeling faint or lightheaded, falls, headache, increase in blood pressure, nausea, vomiting, or wheezing or trouble breathing with loud or whistling sounds Low red blood cell counts--trouble breathing, feeling faint, lightheaded or falls, unusually weak or tired Unusual bleeding or bruising Side effects that usually do not require medical attention (report these to your care team if they   continue or are bothersome): Back pain Constipation Diarrhea Pain, tingling, numbness in the hands or feet Pain, redness, or irritation at site where injected Muscle cramp or pain Swelling of the ankles, feet, hands Tiredness Trouble sleeping This list may not describe all possible side effects. Call your doctor for medical advice about side effects. You may report side effects to FDA at 1-800-FDA-1088. Where should I keep my medication? This drug is given in a hospital or  clinic and will not be stored at home. NOTE: This sheet is a summary. It may not cover all possible information. If you have questions about this medicine, talk to your doctor, pharmacist, or health care provider.  2023 Elsevier/Gold Standard (2021-02-04 00:00:00)   

## 2021-10-31 NOTE — Progress Notes (Signed)
Patient seen by Lisa Thomas NP today  Vitals are within treatment parameters.  Labs reviewed by Lisa Thomas NP and are within treatment parameters.  Per physician team, patient is ready for treatment and there are NO modifications to the treatment plan.     

## 2021-10-31 NOTE — Progress Notes (Signed)
  [facility] OFFICE PROGRESS NOTE   Diagnosis: Multiple myeloma  INTERVAL HISTORY:   Mr. Sayegh returns as scheduled.  The rash has resolved.  Main complaint is fatigue.  He has mild intermittent nausea.  No vomiting.  He notes an alteration in taste.  No diarrhea.  Objective:  Vital signs in last 24 hours:  Blood pressure 117/79, pulse 71, temperature 98.1 F (36.7 C), temperature source Oral, resp. rate 20, height $RemoveBe'5\' 8"'XcvjTeqOw$  (1.727 m), weight 175 lb (79.4 kg), SpO2 100 %.    HEENT: No thrush or ulcers. Resp: Lungs clear bilaterally. Cardio: Regular rate and rhythm. GI: Abdomen soft and nontender.  No hepatosplenomegaly. Vascular: No leg edema. Skin: No skin rash.  Mild dryness posterior upper arms.   Lab Results:  Lab Results  Component Value Date   WBC 11.9 (H) 10/31/2021   HGB 14.1 10/31/2021   HCT 40.3 10/31/2021   MCV 96.6 10/31/2021   PLT 220 10/31/2021   NEUTROABS 7.3 10/31/2021    Imaging:  No results found.  Medications: I have reviewed the patient's current medications.  Assessment/Plan: Multiple myeloma -05/22/2021-kappa free light chain 11.3, lambda free light chain 17,028 -25 g proteinuria, 20 g lambda light chains -Serum immunofixation 05/22/2021-monoclonal lambda light chains -Bone marrow biopsy 05/23/2021-hypercellular marrow involved by plasma cell myeloma, 80-90% plasma cells on the bone marrow biopsy, diminished iron stains -Cytogenetics-45 X, minus Y, t11:14 (11 q13) -Cycle 1 Velcade/Decadron 05/23/2021 (Decadron given 05/22/2021) -Cycle 1 DRVd 06/13/2021, treatment held 06/20/2021 due to a rash, Revlimid resumed 06/28/2021, daratumumab resumed 07/04/2021 Revlimid resumed 06/28/2021 Cycle 2 daratumumab/Revlimid/Decadron/Velcade 07/18/2021 (Revlimid started 07/19/2021, placed on hold 07/25/2021 secondary to a rash-last taken 07/24/2021) Treatment continued with weekly Velcade/Decadron and daratumumab Treatment changed to pomalidomide, daratumumab, Decadron  10/03/2021 Pomalidomide placed on hold 10/17/2021 due to a rash Pomalidomide resumed at a reduced dose of 2 mg daily for 21 days followed by 7-day break 10/31/2021   Renal failure secondary to #1-improved Hypercalcemia-status post treat with calcitonin, pamidronate 05/22/2021-resolved Lytic bone lesions Anemia Thrombocytopenia Hypocalcemia-IV calcium gluconate 05/30/2021, resolved Rash 06/20/2021, appears consistent with a drug rash, recurrent rash 07/24/2021-Revlimid placed on hold; recurrent rash 10/17/2021  Disposition: Mr. Salguero appears stable.  The skin rash has resolved.  He will resume Pomalidomide at a reduced dose of 2 mg daily for 21 days followed by 7-day break.  He understands to discontinue Pomalidomide and contact the office if the rash recurs.  He is scheduled to receive daratumumab today.  CBC and chemistry panel reviewed.  Labs adequate to proceed as above.  We will see him in follow-up in 2 weeks.  We are available to see him sooner if needed.    Ned Card ANP/GNP-BC   10/31/2021  9:06 AM

## 2021-11-01 LAB — KAPPA/LAMBDA LIGHT CHAINS
Kappa free light chain: 9.7 mg/L (ref 3.3–19.4)
Kappa, lambda light chain ratio: 0.04 — ABNORMAL LOW (ref 0.26–1.65)
Lambda free light chains: 272.9 mg/L — ABNORMAL HIGH (ref 5.7–26.3)

## 2021-11-02 ENCOUNTER — Other Ambulatory Visit: Payer: Self-pay

## 2021-11-03 ENCOUNTER — Encounter: Payer: Self-pay | Admitting: *Deleted

## 2021-11-03 NOTE — Progress Notes (Signed)
Mr. Luppino requesting provider to reach out to HR w/his company to extend his LOA. Last disability form had 03/20/22 as estimated return to work date. Requested via voice mail and email how they wish provider to communicate this (script or new form)? Please send a new form if this is the requirement.

## 2021-11-08 ENCOUNTER — Encounter: Payer: Self-pay | Admitting: *Deleted

## 2021-11-13 ENCOUNTER — Other Ambulatory Visit: Payer: Self-pay | Admitting: Oncology

## 2021-11-14 ENCOUNTER — Inpatient Hospital Stay (HOSPITAL_BASED_OUTPATIENT_CLINIC_OR_DEPARTMENT_OTHER): Payer: BLUE CROSS/BLUE SHIELD | Admitting: Nurse Practitioner

## 2021-11-14 ENCOUNTER — Inpatient Hospital Stay: Payer: BLUE CROSS/BLUE SHIELD | Admitting: Oncology

## 2021-11-14 ENCOUNTER — Encounter: Payer: Self-pay | Admitting: Nurse Practitioner

## 2021-11-14 ENCOUNTER — Inpatient Hospital Stay: Payer: BLUE CROSS/BLUE SHIELD

## 2021-11-14 VITALS — BP 124/85 | HR 89 | Temp 97.6°F | Resp 18 | Wt 177.2 lb

## 2021-11-14 DIAGNOSIS — C9000 Multiple myeloma not having achieved remission: Secondary | ICD-10-CM

## 2021-11-14 LAB — CMP (CANCER CENTER ONLY)
ALT: 11 U/L (ref 0–44)
AST: 9 U/L — ABNORMAL LOW (ref 15–41)
Albumin: 4.5 g/dL (ref 3.5–5.0)
Alkaline Phosphatase: 67 U/L (ref 38–126)
Anion gap: 10 (ref 5–15)
BUN: 21 mg/dL — ABNORMAL HIGH (ref 6–20)
CO2: 25 mmol/L (ref 22–32)
Calcium: 10 mg/dL (ref 8.9–10.3)
Chloride: 100 mmol/L (ref 98–111)
Creatinine: 1.19 mg/dL (ref 0.61–1.24)
GFR, Estimated: >60 mL/min (ref >60)
Glucose, Bld: 107 mg/dL — ABNORMAL HIGH (ref 70–99)
Potassium: 4 mmol/L (ref 3.5–5.1)
Sodium: 135 mmol/L (ref 135–145)
Total Bilirubin: 0.4 mg/dL (ref 0.3–1.2)
Total Protein: 7.4 g/dL (ref 6.5–8.1)

## 2021-11-14 LAB — CBC WITH DIFFERENTIAL (CANCER CENTER ONLY)
Abs Immature Granulocytes: 0.07 10*3/uL (ref 0.00–0.07)
Basophils Absolute: 0.1 10*3/uL (ref 0.0–0.1)
Basophils Relative: 1 %
Eosinophils Absolute: 0.2 10*3/uL (ref 0.0–0.5)
Eosinophils Relative: 2 %
HCT: 42.9 % (ref 39.0–52.0)
Hemoglobin: 15 g/dL (ref 13.0–17.0)
Immature Granulocytes: 1 %
Lymphocytes Relative: 4 %
Lymphs Abs: 0.6 10*3/uL — ABNORMAL LOW (ref 0.7–4.0)
MCH: 33.7 pg (ref 26.0–34.0)
MCHC: 35 g/dL (ref 30.0–36.0)
MCV: 96.4 fL (ref 80.0–100.0)
Monocytes Absolute: 0.9 10*3/uL (ref 0.1–1.0)
Monocytes Relative: 6 %
Neutro Abs: 12.9 10*3/uL — ABNORMAL HIGH (ref 1.7–7.7)
Neutrophils Relative %: 86 %
Platelet Count: 277 10*3/uL (ref 150–400)
RBC: 4.45 MIL/uL (ref 4.22–5.81)
RDW: 15.2 % (ref 11.5–15.5)
WBC Count: 14.8 10*3/uL — ABNORMAL HIGH (ref 4.0–10.5)
nRBC: 0 % (ref 0.0–0.2)

## 2021-11-14 MED ORDER — DIPHENHYDRAMINE HCL 25 MG PO CAPS
50.0000 mg | ORAL_CAPSULE | Freq: Once | ORAL | Status: AC
  Administered 2021-11-14: 50 mg via ORAL
  Filled 2021-11-14: qty 2

## 2021-11-14 MED ORDER — DEXAMETHASONE 4 MG PO TABS
20.0000 mg | ORAL_TABLET | Freq: Once | ORAL | Status: DC
  Filled 2021-11-14: qty 5

## 2021-11-14 MED ORDER — ACETAMINOPHEN 325 MG PO TABS
650.0000 mg | ORAL_TABLET | Freq: Once | ORAL | Status: AC
  Administered 2021-11-14: 650 mg via ORAL
  Filled 2021-11-14: qty 2

## 2021-11-14 MED ORDER — DARATUMUMAB-HYALURONIDASE-FIHJ 1800-30000 MG-UT/15ML ~~LOC~~ SOLN
1800.0000 mg | Freq: Once | SUBCUTANEOUS | Status: AC
  Administered 2021-11-14: 1800 mg via SUBCUTANEOUS
  Filled 2021-11-14: qty 15

## 2021-11-14 NOTE — Patient Instructions (Signed)
[facility]   Discharge Instructions: Thank you for choosing [facility] to provide your oncology and hematology care.   If you have a lab appointment with the [facility], please go directly to the [facility] and check in at the registration area.   Wear comfortable clothing and clothing appropriate for easy access to any Portacath or PICC line.   We strive to give you quality time with your provider. You may need to reschedule your appointment if you arrive late (15 or more minutes).  Arriving late affects you and other patients whose appointments are after yours.  Also, if you miss three or more appointments without notifying the office, you may be dismissed from the clinic at the provider's discretion.      For prescription refill requests, have your pharmacy contact our office and allow 72 hours for refills to be completed.    Today you received the following chemotherapy and/or immunotherapy agents Darzalex   To help prevent nausea and vomiting after your treatment, we encourage you to take your nausea medication as directed.  BELOW ARE SYMPTOMS THAT SHOULD BE REPORTED IMMEDIATELY: *FEVER GREATER THAN 100.4 F (38 C) OR HIGHER *CHILLS OR SWEATING *NAUSEA AND VOMITING THAT IS NOT CONTROLLED WITH YOUR NAUSEA MEDICATION *UNUSUAL SHORTNESS OF BREATH *UNUSUAL BRUISING OR BLEEDING *URINARY PROBLEMS (pain or burning when urinating, or frequent urination) *BOWEL PROBLEMS (unusual diarrhea, constipation, pain near the anus) TENDERNESS IN MOUTH AND THROAT WITH OR WITHOUT PRESENCE OF ULCERS (sore throat, sores in mouth, or a toothache) UNUSUAL RASH, SWELLING OR PAIN  UNUSUAL VAGINAL DISCHARGE OR ITCHING   Items with * indicate a potential emergency and should be followed up as soon as possible or go to the Emergency Department if any problems should occur.  Please show the CHEMOTHERAPY ALERT CARD or IMMUNOTHERAPY ALERT CARD at check-in to the  Emergency Department and triage nurse.  Should you have questions after your visit or need to cancel or reschedule your appointment, please contact [facility]  Dept: [phone]  and follow the prompts.  Office hours are 8:00 a.m. to 4:30 p.m. Monday - Friday. Please note that voicemails left after 4:00 p.m. may not be returned until the following business day.  We are closed weekends and major holidays. You have access to a nurse at all times for urgent questions. Please call the main number to the clinic Dept: [phone] and follow the prompts.   For any non-urgent questions, you may also contact your provider using MyChart. We now offer e-Visits for anyone 42 and older to request care online for non-urgent symptoms. For details visit [email or web address].   Also download the MyChart app! Go to the app store, search "MyChart", open the app, select [facility], and log in with your MyChart username and password.  Masks are optional in the cancer centers. If you would like for your care team to wear a mask while they are taking care of you, please let them know. You may have one support person who is at least 50 years old accompany you for your appointments.  Daratumumab; Hyaluronidase Injection What is this medication? DARATUMUMAB; HYALURONIDASE (dar a toom ue mab; hye al ur ON i dase) treats multiple myeloma, a type of bone marrow cancer. Daratumumab works by blocking a protein that causes cancer cells to grow and multiply. This helps to slow or stop the spread of cancer cells. Hyaluronidase works by increasing the absorption of other medications  in the body to help them work better. This medication may also be used treat amyloidosis, a condition that causes the buildup of a protein (amyloid) in your body. It works by reducing the buildup of this protein, which decreases symptoms. It is a combination medication that contains a monoclonal antibody. This medicine may  be used for other purposes; ask your health care provider or pharmacist if you have questions. COMMON BRAND NAME(S): DARZALEX FASPRO What should I tell my care team before I take this medication? They need to know if you have any of these conditions: Heart disease Infection, such as chickenpox, cold sores, herpes, hepatitis B Lung or breathing disease An unusual or allergic reaction to daratumumab, hyaluronidase, other medications, foods, dyes, or preservatives Pregnant or trying to get pregnant Breast-feeding How should I use this medication? This medication is injected under the skin. It is given by your care team in a hospital or clinic setting. Talk to your care team about the use of this medication in children. Special care may be needed. Overdosage: If you think you have taken too much of this medicine contact a poison control center or emergency room at once. NOTE: This medicine is only for you. Do not share this medicine with others. What if I miss a dose? Keep appointments for follow-up doses. It is important not to miss your dose. Call your care team if you are unable to keep an appointment. What may interact with this medication? Interactions have not been studied. This list may not describe all possible interactions. Give your health care provider a list of all the medicines, herbs, non-prescription drugs, or dietary supplements you use. Also tell them if you smoke, drink alcohol, or use illegal drugs. Some items may interact with your medicine. What should I watch for while using this medication? Your condition will be monitored carefully while you are receiving this medication. This medication can cause serious allergic reactions. To reduce your risk, your care team may give you other medication to take before receiving this one. Be sure to follow the directions from your care team. This medication can affect the results of blood tests to match your blood type. These changes can  last for up to 6 months after the final dose. Your care team will do blood tests to match your blood type before you start treatment. Tell all of your care team that you are being treated with this medication before receiving a blood transfusion. This medication can affect the results of some tests used to determine treatment response; extra tests may be needed to evaluate response. Talk to your care team if you wish to become pregnant or think you are pregnant. This medication can cause serious birth defects if taken during pregnancy and for 3 months after the last dose. A reliable form of contraception is recommended while taking this medication and for 3 months after the last dose. Talk to your care team about effective forms of contraception. Do not breast-feed while taking this medication. What side effects may I notice from receiving this medication? Side effects that you should report to your care team as soon as possible: Allergic reactions--skin rash, itching, hives, swelling of the face, lips, tongue, or throat Heart rhythm changes--fast or irregular heartbeat, dizziness, feeling faint or lightheaded, chest pain, trouble breathing Infection--fever, chills, cough, sore throat, wounds that don't heal, pain or trouble when passing urine, general feeling of discomfort or being unwell Infusion reactions--chest pain, shortness of breath or trouble breathing, feeling faint  or lightheaded Sudden eye pain or change in vision such as blurry vision, seeing halos around lights, vision loss Unusual bruising or bleeding Side effects that usually do not require medical attention (report to your care team if they continue or are bothersome): Constipation Diarrhea Fatigue Nausea Pain, tingling, or numbness in the hands or feet Swelling of the ankles, hands, or feet This list may not describe all possible side effects. Call your doctor for medical advice about side effects. You may report side effects to  FDA at 1-800-FDA-1088. Where should I keep my medication? This medication is given in a hospital or clinic. It will not be stored at home. NOTE: This sheet is a summary. It may not cover all possible information. If you have questions about this medicine, talk to your doctor, pharmacist, or health care provider.  2023 Elsevier/Gold Standard (2021-06-29 00:00:00)

## 2021-11-14 NOTE — Progress Notes (Signed)
Patient seen by Lisa Thomas NP today  Vitals are within treatment parameters.  Labs reviewed by Lisa Thomas NP and are within treatment parameters.  Per physician team, patient is ready for treatment and there are NO modifications to the treatment plan.     

## 2021-11-14 NOTE — Progress Notes (Signed)
  [facility] OFFICE PROGRESS NOTE   Diagnosis: Multiple myeloma  INTERVAL HISTORY:   Jordan King returns as scheduled.  He resumed pomalidomide at a reduced dose of 2 mg daily for 21 days followed by 7-day break on 10/31/2021.  He completed a treatment with daratumumab 10/31/2021.  He notes a few "bumps" at the upper back.  No significant pruritus/rash since resuming the lower dose of Pomalidomide.  He has periodic nausea.  No vomiting.  No diarrhea.  Some constipation last week.   Objective:  Vital signs in last 24 hours:  Blood pressure 124/85, pulse 89, temperature 97.6 F (36.4 C), temperature source Tympanic, resp. rate 18, weight 177 lb 3.2 oz (80.4 kg), SpO2 98 %.    HEENT: No thrush or ulcers. Resp: Lungs clear bilaterally. Cardio: Regular rate and rhythm. GI: No hepatosplenomegaly. Vascular: No leg edema. Skin: No rash.   Lab Results:  Lab Results  Component Value Date   WBC 14.8 (H) 11/14/2021   HGB 15.0 11/14/2021   HCT 42.9 11/14/2021   MCV 96.4 11/14/2021   PLT 277 11/14/2021   NEUTROABS 12.9 (H) 11/14/2021    Imaging:  No results found.  Medications: I have reviewed the patient's current medications.  Assessment/Plan: Multiple myeloma -05/22/2021-kappa free light chain 11.3, lambda free light chain 17,028 -25 g proteinuria, 20 g lambda light chains -Serum immunofixation 05/22/2021-monoclonal lambda light chains -Bone marrow biopsy 05/23/2021-hypercellular marrow involved by plasma cell myeloma, 80-90% plasma cells on the bone marrow biopsy, diminished iron stains -Cytogenetics-45 X, minus Y, t11:14 (11 q13) -Cycle 1 Velcade/Decadron 05/23/2021 (Decadron given 05/22/2021) -Cycle 1 DRVd 06/13/2021, treatment held 06/20/2021 due to a rash, Revlimid resumed 06/28/2021, daratumumab resumed 07/04/2021 Revlimid resumed 06/28/2021 Cycle 2 daratumumab/Revlimid/Decadron/Velcade 07/18/2021 (Revlimid started 07/19/2021, placed on hold 07/25/2021 secondary to a rash-last  taken 07/24/2021) Treatment continued with weekly Velcade/Decadron and daratumumab Treatment changed to pomalidomide, daratumumab, Decadron 10/03/2021 Pomalidomide placed on hold 10/17/2021 due to a rash Pomalidomide resumed at a reduced dose of 2 mg daily for 21 days followed by 7-day break 10/31/2021   Renal failure secondary to #1-improved Hypercalcemia-status post treat with calcitonin, pamidronate 05/22/2021-resolved Lytic bone lesions Anemia Thrombocytopenia Hypocalcemia-IV calcium gluconate 05/30/2021, resolved Rash 06/20/2021, appears consistent with a drug rash, recurrent rash 07/24/2021-Revlimid placed on hold; recurrent rash 10/17/2021  Disposition: Jordan King appears stable.  The rash has not recurred since resuming Pomalidomide at the reduced dose.  He understands to stop Pomalidomide and contact the office if the rash does recur.  He continues daratumumab every 2 weeks, weekly Decadron.  CBC and chemistry panel reviewed.  Labs are adequate to continue with treatment as above.  He will return for follow-up in 2 weeks.    Ned Card ANP/GNP-BC   11/14/2021  1:51 PM

## 2021-11-14 NOTE — Progress Notes (Signed)
error 

## 2021-11-15 ENCOUNTER — Other Ambulatory Visit: Payer: Self-pay | Admitting: Oncology

## 2021-11-15 ENCOUNTER — Other Ambulatory Visit: Payer: Self-pay

## 2021-11-18 ENCOUNTER — Other Ambulatory Visit: Payer: Self-pay

## 2021-11-25 ENCOUNTER — Telehealth: Payer: Self-pay | Admitting: *Deleted

## 2021-11-25 NOTE — Telephone Encounter (Signed)
Jordan King reports beginning 9/7 symptoms of headache, sinus congestion, chest congestion and cough with yellow-green sputum, and sore throat. Denies fever, but having sweats (not unusual for him). Able to eat/drink OK. Tested negative for COVID today. Recent exposure to grandaughter with same symptoms, but he is not aware if she covid tested or not. Asking for Z-pack.  Reviewed w/NP: Z-pack not appropriate at this time. Sound like a viral illness. Encourages him to see PCP or urgent care for flu and strep test as well. Encourage him to re-test for covid Sunday. Instructed him to call Monday morning with his status, in case his appointment needs to be cancelled.

## 2021-11-28 ENCOUNTER — Telehealth: Payer: Self-pay

## 2021-11-28 ENCOUNTER — Inpatient Hospital Stay: Payer: BLUE CROSS/BLUE SHIELD

## 2021-11-28 ENCOUNTER — Inpatient Hospital Stay: Payer: BLUE CROSS/BLUE SHIELD | Admitting: Nurse Practitioner

## 2021-11-28 NOTE — Telephone Encounter (Signed)
Jordan King called in reported that his symptoms are getting worst. Symptoms are chest congestion, sinus congestion, and cough. Denies fever. Patient stated he tested negative for Covid, flu, and strep. And his PCP will not give him any antibiotic. I advise the patient Lattie Haw, NP agree with his PCP it is not appropriate time for antibiotic. Encourages the patient to go to the urgent care if he feel like he needs to go.

## 2021-12-02 ENCOUNTER — Encounter: Payer: Self-pay | Admitting: Oncology

## 2021-12-04 ENCOUNTER — Encounter: Payer: Self-pay | Admitting: Oncology

## 2021-12-05 ENCOUNTER — Inpatient Hospital Stay: Payer: BLUE CROSS/BLUE SHIELD

## 2021-12-05 ENCOUNTER — Inpatient Hospital Stay: Payer: BLUE CROSS/BLUE SHIELD | Admitting: Nurse Practitioner

## 2021-12-10 ENCOUNTER — Other Ambulatory Visit: Payer: Self-pay | Admitting: Oncology

## 2021-12-12 ENCOUNTER — Encounter: Payer: Self-pay | Admitting: Nurse Practitioner

## 2021-12-12 ENCOUNTER — Inpatient Hospital Stay (HOSPITAL_BASED_OUTPATIENT_CLINIC_OR_DEPARTMENT_OTHER): Payer: BLUE CROSS/BLUE SHIELD | Admitting: Nurse Practitioner

## 2021-12-12 ENCOUNTER — Inpatient Hospital Stay: Payer: BLUE CROSS/BLUE SHIELD

## 2021-12-12 ENCOUNTER — Inpatient Hospital Stay: Payer: BLUE CROSS/BLUE SHIELD | Attending: Oncology

## 2021-12-12 ENCOUNTER — Other Ambulatory Visit: Payer: Self-pay | Admitting: Nurse Practitioner

## 2021-12-12 DIAGNOSIS — C9000 Multiple myeloma not having achieved remission: Secondary | ICD-10-CM | POA: Insufficient documentation

## 2021-12-12 DIAGNOSIS — Z79899 Other long term (current) drug therapy: Secondary | ICD-10-CM | POA: Insufficient documentation

## 2021-12-12 DIAGNOSIS — Z5112 Encounter for antineoplastic immunotherapy: Secondary | ICD-10-CM | POA: Diagnosis present

## 2021-12-12 DIAGNOSIS — D649 Anemia, unspecified: Secondary | ICD-10-CM | POA: Insufficient documentation

## 2021-12-12 DIAGNOSIS — R21 Rash and other nonspecific skin eruption: Secondary | ICD-10-CM | POA: Insufficient documentation

## 2021-12-12 DIAGNOSIS — D696 Thrombocytopenia, unspecified: Secondary | ICD-10-CM | POA: Diagnosis not present

## 2021-12-12 LAB — CBC WITH DIFFERENTIAL (CANCER CENTER ONLY)
Abs Immature Granulocytes: 0.1 10*3/uL — ABNORMAL HIGH (ref 0.00–0.07)
Basophils Absolute: 0.1 10*3/uL (ref 0.0–0.1)
Basophils Relative: 1 %
Eosinophils Absolute: 0.8 10*3/uL — ABNORMAL HIGH (ref 0.0–0.5)
Eosinophils Relative: 5 %
HCT: 39.5 % (ref 39.0–52.0)
Hemoglobin: 13.6 g/dL (ref 13.0–17.0)
Immature Granulocytes: 1 %
Lymphocytes Relative: 10 %
Lymphs Abs: 1.7 10*3/uL (ref 0.7–4.0)
MCH: 33.4 pg (ref 26.0–34.0)
MCHC: 34.4 g/dL (ref 30.0–36.0)
MCV: 97.1 fL (ref 80.0–100.0)
Monocytes Absolute: 2.8 10*3/uL — ABNORMAL HIGH (ref 0.1–1.0)
Monocytes Relative: 17 %
Neutro Abs: 10.9 10*3/uL — ABNORMAL HIGH (ref 1.7–7.7)
Neutrophils Relative %: 66 %
Platelet Count: 308 10*3/uL (ref 150–400)
RBC: 4.07 MIL/uL — ABNORMAL LOW (ref 4.22–5.81)
RDW: 14.7 % (ref 11.5–15.5)
WBC Count: 16.4 10*3/uL — ABNORMAL HIGH (ref 4.0–10.5)
nRBC: 0 % (ref 0.0–0.2)

## 2021-12-12 LAB — CMP (CANCER CENTER ONLY)
ALT: 11 U/L (ref 0–44)
AST: 9 U/L — ABNORMAL LOW (ref 15–41)
Albumin: 4.3 g/dL (ref 3.5–5.0)
Alkaline Phosphatase: 58 U/L (ref 38–126)
Anion gap: 10 (ref 5–15)
BUN: 19 mg/dL (ref 6–20)
CO2: 27 mmol/L (ref 22–32)
Calcium: 9.5 mg/dL (ref 8.9–10.3)
Chloride: 96 mmol/L — ABNORMAL LOW (ref 98–111)
Creatinine: 1.08 mg/dL (ref 0.61–1.24)
GFR, Estimated: >60 mL/min (ref >60)
Glucose, Bld: 102 mg/dL — ABNORMAL HIGH (ref 70–99)
Potassium: 3.5 mmol/L (ref 3.5–5.1)
Sodium: 133 mmol/L — ABNORMAL LOW (ref 135–145)
Total Bilirubin: 0.4 mg/dL (ref 0.3–1.2)
Total Protein: 6.8 g/dL (ref 6.5–8.1)

## 2021-12-12 MED ORDER — ACETAMINOPHEN 325 MG PO TABS
650.0000 mg | ORAL_TABLET | Freq: Once | ORAL | Status: AC
  Administered 2021-12-12: 650 mg via ORAL
  Filled 2021-12-12: qty 2

## 2021-12-12 MED ORDER — DIPHENHYDRAMINE HCL 25 MG PO CAPS
50.0000 mg | ORAL_CAPSULE | Freq: Once | ORAL | Status: AC
  Administered 2021-12-12: 50 mg via ORAL
  Filled 2021-12-12: qty 2

## 2021-12-12 MED ORDER — PROCHLORPERAZINE MALEATE 10 MG PO TABS: ORAL_TABLET | ORAL | 2 refills | Status: DC

## 2021-12-12 MED ORDER — LORAZEPAM 0.5 MG PO TABS: ORAL_TABLET | ORAL | 0 refills | Status: DC

## 2021-12-12 MED ORDER — DEXAMETHASONE 4 MG PO TABS
20.0000 mg | ORAL_TABLET | Freq: Once | ORAL | Status: AC
  Administered 2021-12-12: 20 mg via ORAL
  Filled 2021-12-12: qty 5

## 2021-12-12 MED ORDER — PANTOPRAZOLE SODIUM 20 MG PO TBEC: 20.0000 mg | DELAYED_RELEASE_TABLET | Freq: Every day | ORAL | 1 refills | Status: DC

## 2021-12-12 MED ORDER — DARATUMUMAB-HYALURONIDASE-FIHJ 1800-30000 MG-UT/15ML ~~LOC~~ SOLN
1800.0000 mg | Freq: Once | SUBCUTANEOUS | Status: AC
  Administered 2021-12-12: 1800 mg via SUBCUTANEOUS
  Filled 2021-12-12: qty 15

## 2021-12-12 MED ORDER — DEXAMETHASONE 4 MG PO TABS: 20.0000 mg | ORAL_TABLET | ORAL | 0 refills | Status: DC

## 2021-12-12 MED ORDER — OXYCODONE-ACETAMINOPHEN 5-325 MG PO TABS: 1.0000 | ORAL_TABLET | Freq: Four times a day (QID) | ORAL | 0 refills | Status: DC | PRN

## 2021-12-12 NOTE — Patient Instructions (Signed)
[facility]   Discharge Instructions: Thank you for choosing [facility] to provide your oncology and hematology care.   If you have a lab appointment with the Cancer Center, please go directly to the Cancer Center and check in at the registration area.   Wear comfortable clothing and clothing appropriate for easy access to any Portacath or PICC line.   We strive to give you quality time with your provider. You may need to reschedule your appointment if you arrive late (15 or more minutes).  Arriving late affects you and other patients whose appointments are after yours.  Also, if you miss three or more appointments without notifying the office, you may be dismissed from the clinic at the provider's discretion.      For prescription refill requests, have your pharmacy contact our office and allow 72 hours for refills to be completed.    Today you received the following chemotherapy and/or immunotherapy agents Darzalex Faspro      To help prevent nausea and vomiting after your treatment, we encourage you to take your nausea medication as directed.  BELOW ARE SYMPTOMS THAT SHOULD BE REPORTED IMMEDIATELY: *FEVER GREATER THAN 100.4 F (38 C) OR HIGHER *CHILLS OR SWEATING *NAUSEA AND VOMITING THAT IS NOT CONTROLLED WITH YOUR NAUSEA MEDICATION *UNUSUAL SHORTNESS OF BREATH *UNUSUAL BRUISING OR BLEEDING *URINARY PROBLEMS (pain or burning when urinating, or frequent urination) *BOWEL PROBLEMS (unusual diarrhea, constipation, pain near the anus) TENDERNESS IN MOUTH AND THROAT WITH OR WITHOUT PRESENCE OF ULCERS (sore throat, sores in mouth, or a toothache) UNUSUAL RASH, SWELLING OR PAIN  UNUSUAL VAGINAL DISCHARGE OR ITCHING   Items with * indicate a potential emergency and should be followed up as soon as possible or go to the Emergency Department if any problems should occur.  Please show the CHEMOTHERAPY ALERT CARD or IMMUNOTHERAPY ALERT CARD at check-in  to the Emergency Department and triage nurse.  Should you have questions after your visit or need to cancel or reschedule your appointment, please contact [facility]  Dept: [phone]  and follow the prompts.  Office hours are 8:00 a.m. to 4:30 p.m. Monday - Friday. Please note that voicemails left after 4:00 p.m. may not be returned until the following business day.  We are closed weekends and major holidays. You have access to a nurse at all times for urgent questions. Please call the main number to the clinic Dept: [phone] and follow the prompts.   For any non-urgent questions, you may also contact your provider using MyChart. We now offer e-Visits for anyone 18 and older to request care online for non-urgent symptoms. For details visit [email or web address].   Also download the MyChart app! Go to the app store, search "MyChart", open the app, select [facility], and log in with your MyChart username and password.  Masks are optional in the cancer centers. If you would like for your care team to wear a mask while they are taking care of you, please let them know. You may have one support person who is at least 50 years old accompany you for your appointments.  Daratumumab; Hyaluronidase Injection What is this medication? DARATUMUMAB; HYALURONIDASE (dar a toom ue mab; hye al ur ON i dase) treats multiple myeloma, a type of bone marrow cancer. Daratumumab works by blocking a protein that causes cancer cells to grow and multiply. This helps to slow or stop the spread of cancer cells. Hyaluronidase works by increasing the   absorption of other medications in the body to help them work better. This medication may also be used treat amyloidosis, a condition that causes the buildup of a protein (amyloid) in your body. It works by reducing the buildup of this protein, which decreases symptoms. It is a combination medication that contains a monoclonal antibody. This  medicine may be used for other purposes; ask your health care provider or pharmacist if you have questions. COMMON BRAND NAME(S): DARZALEX FASPRO What should I tell my care team before I take this medication? They need to know if you have any of these conditions: Heart disease Infection, such as chickenpox, cold sores, herpes, hepatitis B Lung or breathing disease An unusual or allergic reaction to daratumumab, hyaluronidase, other medications, foods, dyes, or preservatives Pregnant or trying to get pregnant Breast-feeding How should I use this medication? This medication is injected under the skin. It is given by your care team in a hospital or clinic setting. Talk to your care team about the use of this medication in children. Special care may be needed. Overdosage: If you think you have taken too much of this medicine contact a poison control center or emergency room at once. NOTE: This medicine is only for you. Do not share this medicine with others. What if I miss a dose? Keep appointments for follow-up doses. It is important not to miss your dose. Call your care team if you are unable to keep an appointment. What may interact with this medication? Interactions have not been studied. This list may not describe all possible interactions. Give your health care provider a list of all the medicines, herbs, non-prescription drugs, or dietary supplements you use. Also tell them if you smoke, drink alcohol, or use illegal drugs. Some items may interact with your medicine. What should I watch for while using this medication? Your condition will be monitored carefully while you are receiving this medication. This medication can cause serious allergic reactions. To reduce your risk, your care team may give you other medication to take before receiving this one. Be sure to follow the directions from your care team. This medication can affect the results of blood tests to match your blood type. These  changes can last for up to 6 months after the final dose. Your care team will do blood tests to match your blood type before you start treatment. Tell all of your care team that you are being treated with this medication before receiving a blood transfusion. This medication can affect the results of some tests used to determine treatment response; extra tests may be needed to evaluate response. Talk to your care team if you wish to become pregnant or think you are pregnant. This medication can cause serious birth defects if taken during pregnancy and for 3 months after the last dose. A reliable form of contraception is recommended while taking this medication and for 3 months after the last dose. Talk to your care team about effective forms of contraception. Do not breast-feed while taking this medication. What side effects may I notice from receiving this medication? Side effects that you should report to your care team as soon as possible: Allergic reactions--skin rash, itching, hives, swelling of the face, lips, tongue, or throat Heart rhythm changes--fast or irregular heartbeat, dizziness, feeling faint or lightheaded, chest pain, trouble breathing Infection--fever, chills, cough, sore throat, wounds that don't heal, pain or trouble when passing urine, general feeling of discomfort or being unwell Infusion reactions--chest pain, shortness of breath or   trouble breathing, feeling faint or lightheaded Sudden eye pain or change in vision such as blurry vision, seeing halos around lights, vision loss Unusual bruising or bleeding Side effects that usually do not require medical attention (report to your care team if they continue or are bothersome): Constipation Diarrhea Fatigue Nausea Pain, tingling, or numbness in the hands or feet Swelling of the ankles, hands, or feet This list may not describe all possible side effects. Call your doctor for medical advice about side effects. You may report side  effects to FDA at 1-800-FDA-1088. Where should I keep my medication? This medication is given in a hospital or clinic. It will not be stored at home. NOTE: This sheet is a summary. It may not cover all possible information. If you have questions about this medicine, talk to your doctor, pharmacist, or health care provider.  2023 Elsevier/Gold Standard (2021-06-29 00:00:00)  

## 2021-12-12 NOTE — Progress Notes (Signed)
  [facility] OFFICE PROGRESS NOTE   Diagnosis: Multiple myeloma  INTERVAL HISTORY:   [name] returns for follow-up.  He began cycle 2 Pomalidomide 11/28/2021.  Last daratumumab 11/14/2021.  He developed respiratory symptoms and canceled his appointment a few weeks ago.  He completed a Z-Pak.  Symptoms are better.  He feels more fatigued.  He is beginning to notice a rash on his chest and abdomen.  Objective:  Vital signs in last 24 hours:  Blood pressure 129/79, pulse 89, temperature 98.1 F (36.7 C), temperature source Oral, resp. rate 18, height $RemoveBe'5\' 8"'NBNUnEnxw$  (1.727 m), weight 177 lb 6.4 oz (80.5 kg), SpO2 98 %.    HEENT: No thrush or ulcers. Resp: Lungs clear bilaterally. Cardio: Regular rate and rhythm. GI: No hepatosplenomegaly.  Mild tenderness left abdomen. Vascular: No leg edema. Neuro: Alert and oriented. Skin: Fine macular rash scattered over the trunk and upper arms.   Lab Results:  Lab Results  Component Value Date   WBC 16.4 (H) 12/12/2021   HGB 13.6 12/12/2021   HCT 39.5 12/12/2021   MCV 97.1 12/12/2021   PLT 308 12/12/2021   NEUTROABS 10.9 (H) 12/12/2021    Imaging:  No results found.  Medications: I have reviewed the patient's current medications.  Assessment/Plan: Multiple myeloma -05/22/2021-kappa free light chain 11.3, lambda free light chain 17,028 -25 g proteinuria, 20 g lambda light chains -Serum immunofixation 05/22/2021-monoclonal lambda light chains -Bone marrow biopsy 05/23/2021-hypercellular marrow involved by plasma cell myeloma, 80-90% plasma cells on the bone marrow biopsy, diminished iron stains -Cytogenetics-45 X, minus Y, t11:14 (11 q13) -Cycle 1 Velcade/Decadron 05/23/2021 (Decadron given 05/22/2021) -Cycle 1 DRVd 06/13/2021, treatment held 06/20/2021 due to a rash, Revlimid resumed 06/28/2021, daratumumab resumed 07/04/2021 Revlimid resumed 06/28/2021 Cycle 2 daratumumab/Revlimid/Decadron/Velcade 07/18/2021 (Revlimid started 07/19/2021,  placed on hold 07/25/2021 secondary to a rash-last taken 07/24/2021) Treatment continued with weekly Velcade/Decadron and daratumumab Treatment changed to pomalidomide, daratumumab, Decadron 10/03/2021 Pomalidomide placed on hold 10/17/2021 due to a rash Pomalidomide resumed at a reduced dose of 2 mg daily for 21 days followed by 7-day break 10/31/2021 Pomalidomide 2 mg daily for 21 days beginning 11/28/2021   Renal failure secondary to #1-improved Hypercalcemia-status post treat with calcitonin, pamidronate 05/22/2021-resolved Lytic bone lesions Anemia Thrombocytopenia Hypocalcemia-IV calcium gluconate 05/30/2021, resolved Rash 06/20/2021, appears consistent with a drug rash, recurrent rash 07/24/2021-Revlimid placed on hold; recurrent rash 10/17/2021  Disposition: Mr. Brisby appears stable.  He is completing cycle 2 Pomalidomide at the reduced dose.  He has had mild recurrence of the rash.  For now plan to continue Pomalidomide.  He will contact the office if the rash worsens.  He will receive daratumumab today.  We will follow-up on the serum light chains.  CBC and chemistry panel reviewed.  Labs adequate to proceed with treatment as above.  He will return for lab, follow-up, daratumumab in 3 weeks.  Patient seen with Dr. Benay Spice.      Ned Card ANP/GNP-BC   12/12/2021  11:40 AM  This was a shared visit with Ned Card.  Mr. Aday was interviewed and examined.  He has a mild rash from pomalidomide at the reduced dose.  He will continue pomalidomide.  He will contact us if the rash progresses.  He will continue treatment with daratumumab/Decadron. We will follow-up on the myeloma panel from today.  I was present for greater than 50% of today's visit.  I performed medical decision making.  Julieanne Manson, MD

## 2021-12-12 NOTE — Progress Notes (Signed)
Patient seen by Lisa Thomas NP today  Vitals are within treatment parameters.  Labs reviewed by Lisa Thomas NP and are within treatment parameters.  Per physician team, patient is ready for treatment and there are NO modifications to the treatment plan.     

## 2021-12-13 LAB — KAPPA/LAMBDA LIGHT CHAINS
Kappa free light chain: 12.7 mg/L (ref 3.3–19.4)
Kappa, lambda light chain ratio: 0.03 — ABNORMAL LOW (ref 0.26–1.65)
Lambda free light chains: 423.7 mg/L — ABNORMAL HIGH (ref 5.7–26.3)

## 2021-12-14 ENCOUNTER — Other Ambulatory Visit: Payer: Self-pay | Admitting: Oncology

## 2021-12-19 ENCOUNTER — Encounter: Payer: Self-pay | Admitting: Oncology

## 2021-12-20 ENCOUNTER — Inpatient Hospital Stay: Payer: BLUE CROSS/BLUE SHIELD | Attending: Oncology | Admitting: Nurse Practitioner

## 2021-12-20 ENCOUNTER — Encounter: Payer: Self-pay | Admitting: Nurse Practitioner

## 2021-12-20 VITALS — BP 123/84 | HR 83 | Temp 98.1°F | Resp 18 | Ht 68.0 in | Wt 175.0 lb

## 2021-12-20 DIAGNOSIS — C9000 Multiple myeloma not having achieved remission: Secondary | ICD-10-CM | POA: Diagnosis not present

## 2021-12-20 DIAGNOSIS — R944 Abnormal results of kidney function studies: Secondary | ICD-10-CM | POA: Diagnosis not present

## 2021-12-20 DIAGNOSIS — Z79899 Other long term (current) drug therapy: Secondary | ICD-10-CM | POA: Diagnosis not present

## 2021-12-20 DIAGNOSIS — D696 Thrombocytopenia, unspecified: Secondary | ICD-10-CM | POA: Insufficient documentation

## 2021-12-20 DIAGNOSIS — D649 Anemia, unspecified: Secondary | ICD-10-CM | POA: Diagnosis not present

## 2021-12-20 DIAGNOSIS — Z5112 Encounter for antineoplastic immunotherapy: Secondary | ICD-10-CM | POA: Diagnosis present

## 2021-12-20 DIAGNOSIS — R5383 Other fatigue: Secondary | ICD-10-CM | POA: Insufficient documentation

## 2021-12-20 NOTE — Progress Notes (Signed)
[facility] OFFICE PROGRESS NOTE   Diagnosis: Multiple myeloma  INTERVAL HISTORY:   Jordan King returns for follow-up.  He has most recently been on active treatment with Pomalidomide, daratumumab, Decadron.  Serum light chains were higher at 12/12/2021.  He has seen Dr. Feliciana Rossetti with recommendation to change treatment to venetoclax, Carfilzomib, Decadron.  He reports discontinuing Pomalidomide a few days ago.  He notes a persistent skin rash.  He has been experiencing constipation.  He has intermittent left-sided abdominal pain.  He plans to begin a laxative.  Objective:  Vital signs in last 24 hours:  Blood pressure 123/84, pulse 83, temperature 98.1 F (36.7 C), temperature source Oral, resp. rate 18, height 5' 8"  (1.727 m), weight 175 lb (79.4 kg), SpO2 99 %.    HEENT: No thrush or ulcers. Resp: Lungs clear bilaterally. Cardio: Regular rate and rhythm. GI: Abdomen is soft.  Tender at the left abdomen.  No hepatosplenomegaly. Vascular: No leg edema. Skin: Faint erythematous rash on the trunk.   Lab Results:  Lab Results  Component Value Date   WBC 16.4 (H) 12/12/2021   HGB 13.6 12/12/2021   HCT 39.5 12/12/2021   MCV 97.1 12/12/2021   PLT 308 12/12/2021   NEUTROABS 10.9 (H) 12/12/2021    Imaging:  No results found.  Medications: I have reviewed the patient's current medications.  Assessment/Plan: Multiple myeloma -05/22/2021-kappa free light chain 11.3, lambda free light chain 17,028 -25 g proteinuria, 20 g lambda light chains -Serum immunofixation 05/22/2021-monoclonal lambda light chains -Bone marrow biopsy 05/23/2021-hypercellular marrow involved by plasma cell myeloma, 80-90% plasma cells on the bone marrow biopsy, diminished iron stains -Cytogenetics-45 X, minus Y, t11:14 (11 q13) -Cycle 1 Velcade/Decadron 05/23/2021 (Decadron given 05/22/2021) -Cycle 1 DRVd 06/13/2021, treatment held 06/20/2021 due to a rash, Revlimid resumed 06/28/2021, daratumumab resumed  07/04/2021 Revlimid resumed 06/28/2021 Cycle 2 daratumumab/Revlimid/Decadron/Velcade 07/18/2021 (Revlimid started 07/19/2021, placed on hold 07/25/2021 secondary to a rash-last taken 07/24/2021) Treatment continued with weekly Velcade/Decadron and daratumumab Treatment changed to pomalidomide, daratumumab, Decadron 10/03/2021 Pomalidomide placed on hold 10/17/2021 due to a rash Pomalidomide resumed at a reduced dose of 2 mg daily for 21 days followed by 7-day break 10/31/2021 Pomalidomide 2 mg daily for 21 days beginning 11/28/2021 Lambda light chains increased 12/12/2021 Venetoclax/Carfilzomib/Decadron planned   Renal failure secondary to #1-improved Hypercalcemia-status post treat with calcitonin, pamidronate 05/22/2021-resolved Lytic bone lesions Anemia Thrombocytopenia Hypocalcemia-IV calcium gluconate 05/30/2021, resolved Rash 06/20/2021, appears consistent with a drug rash, recurrent rash 07/24/2021-Revlimid placed on hold; recurrent rash 10/17/2021    Disposition: Mr. Goodchild appears stable.  Recent serum light chains were higher.  Pomalidomide/daratumumab being discontinued.  The plan is for venetoclax/Carfilzomib/Decadron per recommendation from Dr. Feliciana Rossetti.  We reviewed potential side effects associated with venetoclax including bone marrow toxicity, tumor lysis syndrome, constipation or diarrhea, nausea, mucositis.  We discussed potential toxicities associated with Carfilzomib including an allergic reaction, hematologic toxicity, heart failure, bleeding, hypertension, posterior reversible encephalopathy syndrome, pulmonary toxicity.  He agrees to proceed.  He will return for lab and Carfilzomib 12/26/2021.  We will see him in follow-up prior to treatment on 01/02/2022.  Patient seen with Dr. Benay Spice.   Ned Card ANP/GNP-BC   12/20/2021  12:56 PM This was a shared visit with Ned Card.  Mr. Stawicki was interviewed and examined.  The serum free light chains are higher he saw Dr. Feliciana Rossetti last week.   Discussed the case with Dr. Feliciana Rossetti.  He recommends changing treatment to carfilzomib, venetoclax, and Decadron.  He  will end [place] the specific recommendations he recommends.  We reviewed potential toxicities associated with venetoclax and carfilzomib.  Mr. Battaglini understands the carfilzomib will be given by intravenous infusion.  He agrees to proceed.  The plan is to initiate salvage therapy on 12/26/2021.  I was present for greater than 50% of today's visit.  I performed medical decision making.  A treatment plan was entered  Julieanne Manson, MD

## 2021-12-21 ENCOUNTER — Encounter: Payer: Self-pay | Admitting: Nurse Practitioner

## 2021-12-21 ENCOUNTER — Other Ambulatory Visit: Payer: Self-pay | Admitting: Oncology

## 2021-12-21 NOTE — Progress Notes (Signed)
START OFF PATHWAY REGIMEN - Multiple Myeloma and Other Plasma Cell Dyscrasias   [id number]:Carfilzomib 20/56 mg/m2 Monotherapy q28 Days:   A cycle is every 28 days:     Carfilzomib      Carfilzomib      Carfilzomib      Carfilzomib   **Always confirm dose/schedule in your pharmacy ordering system**  Patient Characteristics: Multiple Myeloma, Relapsed / Refractory, Second through Fourth Lines of Therapy, Frail or Not a Candidate for Triplet Therapy Disease Classification: Multiple Myeloma R-ISS Staging: Unknown Therapeutic Status: Refractory Line of Therapy: Fourth Line Intent of Therapy: Non-Curative / Palliative Intent, Discussed with Patient

## 2021-12-22 ENCOUNTER — Other Ambulatory Visit: Payer: Self-pay

## 2021-12-22 DIAGNOSIS — C9000 Multiple myeloma not having achieved remission: Secondary | ICD-10-CM

## 2021-12-23 ENCOUNTER — Telehealth: Payer: Self-pay | Admitting: Pharmacy Technician

## 2021-12-23 ENCOUNTER — Telehealth: Payer: Self-pay | Admitting: *Deleted

## 2021-12-23 ENCOUNTER — Other Ambulatory Visit: Payer: Self-pay | Admitting: Nurse Practitioner

## 2021-12-23 ENCOUNTER — Other Ambulatory Visit: Payer: Self-pay | Admitting: Oncology

## 2021-12-23 ENCOUNTER — Other Ambulatory Visit (HOSPITAL_COMMUNITY): Payer: Self-pay

## 2021-12-23 ENCOUNTER — Telehealth: Payer: Self-pay | Admitting: Pharmacist

## 2021-12-23 DIAGNOSIS — C9000 Multiple myeloma not having achieved remission: Secondary | ICD-10-CM

## 2021-12-23 MED ORDER — VENETOCLAX 100 MG PO TABS
200.0000 mg | ORAL_TABLET | Freq: Every day | ORAL | 0 refills | Status: DC
  Filled 2021-12-23 – 2021-12-30 (×4): qty 60, 30d supply, fill #0

## 2021-12-23 MED ORDER — FLUCONAZOLE 200 MG PO TABS: 200.0000 mg | ORAL_TABLET | Freq: Every day | ORAL | 0 refills | Status: DC

## 2021-12-23 MED ORDER — SULFAMETHOXAZOLE-TRIMETHOPRIM 800-160 MG PO TABS: 1.0000 | ORAL_TABLET | ORAL | 3 refills | Status: DC

## 2021-12-23 MED ORDER — LEVOFLOXACIN 500 MG PO TABS: 500.0000 mg | ORAL_TABLET | Freq: Every day | ORAL | 0 refills | Status: DC

## 2021-12-23 MED ORDER — SULFAMETHOXAZOLE-TRIMETHOPRIM 800-160 MG PO TABS: 1.0000 | ORAL_TABLET | Freq: Two times a day (BID) | ORAL | 1 refills | Status: DC

## 2021-12-23 NOTE — Telephone Encounter (Addendum)
Made Jordan King aware that Dr. Feliciana Rossetti wants him to begin Levaquin and Fluconazole daily when he begins the Venetoclax. Also start Bactrum DS in MWF. Continue his valacyclovir. Scripts sent to his [facility]. He reports he will start venetoclax on Monday and is asking if he needs to have lab checked prior to his 10/16 visit? Per Dr. Benay Spice: Run CBC/diff, CMP, phosphorus stat on 12/29/21. Faxed orders to [facility] # [phone]. Also emailed lab orders to patient to print off and take to lab on 10/12 (he is aware).

## 2021-12-23 NOTE — Telephone Encounter (Signed)
Oral Oncology Pharmacist Encounter  Received new prescription for Venclexta (venetoclax) for the treatment of progressive light chain multiple myeloma in conjunction with carfilzomib and dexamethasone, planned duration until disease control or unacceptable drug toxicity.  CMP from 12/26/21 assessed, no relevant lab abnormalities. Prescription dose and frequency assessed.   Current medication list in Epic reviewed, one DDIs with venetoclax identified: Fluconazole: fluconazole may increase the serum concentration of Venetoclax. Patient's venetoclax dose has already been reduced by 50% from the study recommended dose to accommodate this DDI. (Study reference by [facility] team: [id number])  Evaluated chart and no patient barriers to medication adherence identified.   Prescription has been e-scribed to the [facility] for benefits analysis and approval.  Oral Oncology Clinic will continue to follow for insurance authorization, copayment issues, initial counseling and start date.   Darl Pikes, PharmD, BCPS, BCOP, CPP Hematology/Oncology Clinical Pharmacist Practitioner [place]/DB/AP [facility] [phone]  12/23/2021 11:34 AM

## 2021-12-23 NOTE — Telephone Encounter (Signed)
Oral Oncology Patient Advocate Encounter   Received notification that prior authorization for Venclexta is required.   PA submitted on 12/23/2021 Key [id number] Status is pending     Jordan King, CPhT-Adv Oncology Pharmacy Patient [place] Direct Number: [phone]  Fax: [phone]

## 2021-12-23 NOTE — Telephone Encounter (Signed)
Oral Oncology Patient Advocate Encounter  Prior Authorization for Jordan King has been approved.    PA# [id number] Effective dates: 12/23/2021 through 12/24/2022  Patients co-pay is $2,135.73.    Jordan King, CPhT-Adv Oncology Pharmacy Patient [place] Direct Number: [phone]  Fax: [phone]

## 2021-12-24 ENCOUNTER — Other Ambulatory Visit (HOSPITAL_COMMUNITY): Payer: Self-pay

## 2021-12-24 ENCOUNTER — Other Ambulatory Visit: Payer: Self-pay

## 2021-12-25 NOTE — Progress Notes (Signed)
Pharmacist Chemotherapy Monitoring - Initial Assessment    Anticipated start date: 12/26/21   The following has been reviewed per standard work regarding the patient's treatment regimen: The patient's diagnosis, treatment plan and drug doses, and organ/hematologic function Lab orders and baseline tests specific to treatment regimen  The treatment plan start date, drug sequencing, and pre-medications Prior authorization status  Patient's documented medication list, including drug-drug interaction screen and prescriptions for anti-emetics and supportive care specific to the treatment regimen The drug concentrations, fluid compatibility, administration routes, and timing of the medications to be used The patient's access for treatment and lifetime cumulative dose history, if applicable  The patient's medication allergies and previous infusion related reactions, if applicable   Changes made to treatment plan:  N/A  Follow up needed:  Pending authorization for treatment    Patrica Duel, [facility], 12/25/2021  10:27 AM

## 2021-12-26 ENCOUNTER — Encounter: Payer: Self-pay | Admitting: Oncology

## 2021-12-26 ENCOUNTER — Other Ambulatory Visit (HOSPITAL_COMMUNITY): Payer: Self-pay

## 2021-12-26 ENCOUNTER — Inpatient Hospital Stay: Payer: BLUE CROSS/BLUE SHIELD

## 2021-12-26 ENCOUNTER — Telehealth (HOSPITAL_COMMUNITY): Payer: Self-pay

## 2021-12-26 VITALS — BP 125/83 | HR 76 | Temp 97.6°F | Resp 20 | Ht 68.0 in | Wt 176.8 lb

## 2021-12-26 DIAGNOSIS — C9000 Multiple myeloma not having achieved remission: Secondary | ICD-10-CM

## 2021-12-26 LAB — CBC WITH DIFFERENTIAL (CANCER CENTER ONLY)
Abs Immature Granulocytes: 0.05 10*3/uL (ref 0.00–0.07)
Basophils Absolute: 0.2 10*3/uL — ABNORMAL HIGH (ref 0.0–0.1)
Basophils Relative: 1 %
Eosinophils Absolute: 1.1 10*3/uL — ABNORMAL HIGH (ref 0.0–0.5)
Eosinophils Relative: 9 %
HCT: 39.5 % (ref 39.0–52.0)
Hemoglobin: 13.9 g/dL (ref 13.0–17.0)
Immature Granulocytes: 0 %
Lymphocytes Relative: 28 %
Lymphs Abs: 3.4 10*3/uL (ref 0.7–4.0)
MCH: 33.8 pg (ref 26.0–34.0)
MCHC: 35.2 g/dL (ref 30.0–36.0)
MCV: 96.1 fL (ref 80.0–100.0)
Monocytes Absolute: 1.4 10*3/uL — ABNORMAL HIGH (ref 0.1–1.0)
Monocytes Relative: 12 %
Neutro Abs: 5.8 10*3/uL (ref 1.7–7.7)
Neutrophils Relative %: 50 %
Platelet Count: 312 10*3/uL (ref 150–400)
RBC: 4.11 MIL/uL — ABNORMAL LOW (ref 4.22–5.81)
RDW: 14.4 % (ref 11.5–15.5)
WBC Count: 11.9 10*3/uL — ABNORMAL HIGH (ref 4.0–10.5)
nRBC: 0 % (ref 0.0–0.2)

## 2021-12-26 LAB — CMP (CANCER CENTER ONLY)
ALT: 9 U/L (ref 0–44)
AST: 9 U/L — ABNORMAL LOW (ref 15–41)
Albumin: 4.3 g/dL (ref 3.5–5.0)
Alkaline Phosphatase: 64 U/L (ref 38–126)
Anion gap: 11 (ref 5–15)
BUN: 19 mg/dL (ref 6–20)
CO2: 25 mmol/L (ref 22–32)
Calcium: 10.3 mg/dL (ref 8.9–10.3)
Chloride: 101 mmol/L (ref 98–111)
Creatinine: 1.15 mg/dL (ref 0.61–1.24)
GFR, Estimated: >60 mL/min (ref >60)
Glucose, Bld: 99 mg/dL (ref 70–99)
Potassium: 3.8 mmol/L (ref 3.5–5.1)
Sodium: 137 mmol/L (ref 135–145)
Total Bilirubin: 0.3 mg/dL (ref 0.3–1.2)
Total Protein: 7.1 g/dL (ref 6.5–8.1)

## 2021-12-26 MED ORDER — SODIUM CHLORIDE 0.9 % IV SOLN: Freq: Once | INTRAVENOUS | Status: AC

## 2021-12-26 MED ORDER — DEXTROSE 5 % IV SOLN
20.0000 mg/m2 | Freq: Once | INTRAVENOUS | Status: AC
  Administered 2021-12-26: 40 mg via INTRAVENOUS
  Filled 2021-12-26: qty 15

## 2021-12-26 MED ORDER — DEXAMETHASONE 4 MG PO TABS: 20.0000 mg | ORAL_TABLET | Freq: Once | ORAL | Status: DC

## 2021-12-26 MED ORDER — ZOLEDRONIC ACID 4 MG/100ML IV SOLN
4.0000 mg | Freq: Once | INTRAVENOUS | Status: AC
  Administered 2021-12-26: 4 mg via INTRAVENOUS
  Filled 2021-12-26: qty 100

## 2021-12-26 NOTE — Patient Instructions (Signed)
[facility]   Discharge Instructions: Thank you for choosing [facility] to provide your oncology and hematology care.   If you have a lab appointment with the Cancer Center, please go directly to the Cancer Center and check in at the registration area.   Wear comfortable clothing and clothing appropriate for easy access to any Portacath or PICC line.   We strive to give you quality time with your provider. You may need to reschedule your appointment if you arrive late (15 or more minutes).  Arriving late affects you and other patients whose appointments are after yours.  Also, if you miss three or more appointments without notifying the office, you may be dismissed from the clinic at the provider's discretion.      For prescription refill requests, have your pharmacy contact our office and allow 72 hours for refills to be completed.    Today you received the following chemotherapy and/or immunotherapy agents Kyprolis.       To help prevent nausea and vomiting after your treatment, we encourage you to take your nausea medication as directed.  BELOW ARE SYMPTOMS THAT SHOULD BE REPORTED IMMEDIATELY: *FEVER GREATER THAN 100.4 F (38 C) OR HIGHER *CHILLS OR SWEATING *NAUSEA AND VOMITING THAT IS NOT CONTROLLED WITH YOUR NAUSEA MEDICATION *UNUSUAL SHORTNESS OF BREATH *UNUSUAL BRUISING OR BLEEDING *URINARY PROBLEMS (pain or burning when urinating, or frequent urination) *BOWEL PROBLEMS (unusual diarrhea, constipation, pain near the anus) TENDERNESS IN MOUTH AND THROAT WITH OR WITHOUT PRESENCE OF ULCERS (sore throat, sores in mouth, or a toothache) UNUSUAL RASH, SWELLING OR PAIN  UNUSUAL VAGINAL DISCHARGE OR ITCHING   Items with * indicate a potential emergency and should be followed up as soon as possible or go to the Emergency Department if any problems should occur.  Please show the CHEMOTHERAPY ALERT CARD or IMMUNOTHERAPY ALERT CARD at check-in to  the Emergency Department and triage nurse.  Should you have questions after your visit or need to cancel or reschedule your appointment, please contact [facility]  Dept: [phone]  and follow the prompts.  Office hours are 8:00 a.m. to 4:30 p.m. Monday - Friday. Please note that voicemails left after 4:00 p.m. may not be returned until the following business day.  We are closed weekends and major holidays. You have access to a nurse at all times for urgent questions. Please call the main number to the clinic Dept: [phone] and follow the prompts.   For any non-urgent questions, you may also contact your provider using MyChart. We now offer e-Visits for anyone 18 and older to request care online for non-urgent symptoms. For details visit [email or web address].   Also download the MyChart app! Go to the app store, search "MyChart", open the app, select [facility], and log in with your MyChart username and password.  Masks are optional in the cancer centers. If you would like for your care team to wear a mask while they are taking care of you, please let them know. You may have one support person who is at least 50 years old accompany you for your appointments.  Carfilzomib Injection What is this medication? CARFILZOMIB (kar FILZ oh mib) treats multiple myeloma, a type of bone marrow cancer. It works by blocking a protein that causes cancer cells to grow and multiply. This helps to slow or stop the spread of cancer cells. This medicine may be used for other purposes; ask your health care provider or   pharmacist if you have questions. COMMON BRAND NAME(S): KYPROLIS What should I tell my care team before I take this medication? They need to know if you have any of these conditions: Heart disease History of blood clots Irregular heartbeat Kidney disease Liver disease Lung or breathing disease An unusual or allergic reaction to carfilzomib, or other medications,  foods, dyes, or preservatives If you or your partner are pregnant or trying to get pregnant Breastfeeding How should I use this medication? This medication is injected into a vein. It is given by your care team in a hospital or clinic setting. Talk to your care team about the use of this medication in children. Special care may be needed. Overdosage: If you think you have taken too much of this medicine contact a poison control center or emergency room at once. NOTE: This medicine is only for you. Do not share this medicine with others. What if I miss a dose? Keep appointments for follow-up doses. It is important not to miss your dose. Call your care team if you are unable to keep an appointment. What may interact with this medication? Interactions are not expected. This list may not describe all possible interactions. Give your health care provider a list of all the medicines, herbs, non-prescription drugs, or dietary supplements you use. Also tell them if you smoke, drink alcohol, or use illegal drugs. Some items may interact with your medicine. What should I watch for while using this medication? Your condition will be monitored carefully while you are receiving this medication. You may need blood work while taking this medication. Check with your care team if you have severe diarrhea, nausea, and vomiting, or if you sweat a lot. The loss of too much body fluid may make it dangerous for you to take this medication. This medication may affect your coordination, reaction time, or judgment. Do not drive or operate machinery until you know how this medication affects you. Sit up or stand slowly to reduce the risk of dizzy or fainting spells. Drinking alcohol with this medication can increase the risk of these side effects. Talk to your care team if you may be pregnant. Serious birth defects can occur if you take this medication during pregnancy and for 6 months after the last dose. You will need a  negative pregnancy test before starting this medication. Contraception is recommended while taking this medication and for 6 months after the last dose. Your care team can help you find an option that works for you. If your partner can get pregnant, use a condom during sex while taking this medication and for 3 months after the last dose. Do not breastfeed while taking this medication and for 2 weeks after the last dose. This medication may cause infertility. Talk to your care team if you are concerned about your fertility. What side effects may I notice from receiving this medication? Side effects that you should report to your care team as soon as possible: Allergic reactions--skin rash, itching, hives, swelling of the face, lips, tongue, or throat Bleeding--bloody or black, tar-like stools, vomiting blood or brown material that looks like coffee grounds, red or dark brown urine, small red or purple spots on skin, unusual bruising or bleeding Blood clot--pain, swelling, or warmth in the leg, shortness of breath, chest pain Dizziness, loss of balance or coordination, confusion or trouble speaking Heart attack--pain or tightness in the chest, shoulders, arms, or jaw, nausea, shortness of breath, cold or clammy skin, feeling faint or lightheaded Heart  failure--shortness of breath, swelling of the ankles, feet, or hands, sudden weight gain, unusual weakness or fatigue Heart rhythm changes--fast or irregular heartbeat, dizziness, feeling faint or lightheaded, chest pain, trouble breathing Increase in blood pressure Infection--fever, chills, cough, sore throat, wounds that don't heal, pain or trouble when passing urine, general feeling of discomfort or being unwell Infusion reactions--chest pain, shortness of breath or trouble breathing, feeling faint or lightheaded Kidney injury--decrease in the amount of urine, swelling of the ankles, hands, or feet Liver injury--right upper belly pain, loss of  appetite, nausea, light-colored stool, dark yellow or brown urine, yellowing skin or eyes, unusual weakness or fatigue Lung injury--shortness of breath or trouble breathing, cough, spitting up blood, chest pain, fever Pulmonary hypertension--shortness of breath, chest pain, fast or irregular heartbeat, feeling faint or lightheaded, fatigue, swelling of the ankles or feet Stomach pain, bloody diarrhea, pale skin, unusual weakness or fatigue, decrease in the amount of urine, which may be signs of hemolytic uremic syndrome Sudden and severe headache, confusion, change in vision, seizures, which may be signs of posterior reversible encephalopathy syndrome (PRES) TTP--purple spots on the skin or inside the mouth, pale skin, yellowing skin or eyes, unusual weakness or fatigue, fever, fast or irregular heartbeat, confusion, change in vision, trouble speaking, trouble walking Tumor lysis syndrome (TLS)--nausea, vomiting, diarrhea, decrease in the amount of urine, dark urine, unusual weakness or fatigue, confusion, muscle pain or cramps, fast or irregular heartbeat, joint pain Side effects that usually do not require medical attention (report to your care team if they continue or are bothersome): Diarrhea Fatigue Nausea Trouble sleeping This list may not describe all possible side effects. Call your doctor for medical advice about side effects. You may report side effects to FDA at 1-800-FDA-1088. Where should I keep my medication? This medication is given in a hospital or clinic. It will not be stored at home. NOTE: This sheet is a summary. It may not cover all possible information. If you have questions about this medicine, talk to your doctor, pharmacist, or health care provider.  2023 Elsevier/Gold Standard (2021-08-03 00:00:00)  Zoledronic Acid Injection (Cancer) What is this medication? ZOLEDRONIC ACID (ZOE le dron ik AS id) treats high calcium levels in the blood caused by cancer. It may also be  used with chemotherapy to treat weakened bones caused by cancer. It works by slowing down the release of calcium from bones. This lowers calcium levels in your blood. It also makes your bones stronger and less likely to break (fracture). It belongs to a group of medications called bisphosphonates. This medicine may be used for other purposes; ask your health care provider or pharmacist if you have questions. COMMON BRAND NAME(S): Zometa, Zometa Powder What should I tell my care team before I take this medication? They need to know if you have any of these conditions: Dehydration Dental disease Kidney disease Liver disease Low levels of calcium in the blood Lung or breathing disease, such as asthma Receiving steroids, such as dexamethasone or prednisone An unusual or allergic reaction to zoledronic acid, other medications, foods, dyes, or preservatives Pregnant or trying to get pregnant Breast-feeding How should I use this medication? This medication is injected into a vein. It is given by your care team in a hospital or clinic setting. Talk to your care team about the use of this medication in children. Special care may be needed. Overdosage: If you think you have taken too much of this medicine contact a poison control center or emergency room  at once. NOTE: This medicine is only for you. Do not share this medicine with others. What if I miss a dose? Keep appointments for follow-up doses. It is important not to miss your dose. Call your care team if you are unable to keep an appointment. What may interact with this medication? Certain antibiotics given by injection Diuretics, such as bumetanide, furosemide NSAIDs, medications for pain and inflammation, such as ibuprofen or naproxen Teriparatide Thalidomide This list may not describe all possible interactions. Give your health care provider a list of all the medicines, herbs, non-prescription drugs, or dietary supplements you use. Also  tell them if you smoke, drink alcohol, or use illegal drugs. Some items may interact with your medicine. What should I watch for while using this medication? Visit your care team for regular checks on your progress. It may be some time before you see the benefit from this medication. Some people who take this medication have severe bone, joint, or muscle pain. This medication may also increase your risk for jaw problems or a broken thigh bone. Tell your care team right away if you have severe pain in your jaw, bones, joints, or muscles. Tell you care team if you have any pain that does not go away or that gets worse. Tell your dentist and dental surgeon that you are taking this medication. You should not have major dental surgery while on this medication. See your dentist to have a dental exam and fix any dental problems before starting this medication. Take good care of your teeth while on this medication. Make sure you see your dentist for regular follow-up appointments. You should make sure you get enough calcium and vitamin D while you are taking this medication. Discuss the foods you eat and the vitamins you take with your care team. Check with your care team if you have severe diarrhea, nausea, and vomiting, or if you sweat a lot. The loss of too much body fluid may make it dangerous for you to take this medication. You may need bloodwork while taking this medication. Talk to your care team if you wish to become pregnant or think you might be pregnant. This medication can cause serious birth defects. What side effects may I notice from receiving this medication? Side effects that you should report to your care team as soon as possible: Allergic reactions--skin rash, itching, hives, swelling of the face, lips, tongue, or throat Kidney injury--decrease in the amount of urine, swelling of the ankles, hands, or feet Low calcium level--muscle pain or cramps, confusion, tingling, or numbness in the  hands or feet Osteonecrosis of the jaw--pain, swelling, or redness in the mouth, numbness of the jaw, poor healing after dental work, unusual discharge from the mouth, visible bones in the mouth Severe bone, joint, or muscle pain Side effects that usually do not require medical attention (report to your care team if they continue or are bothersome): Constipation Fatigue Fever Loss of appetite Nausea Stomach pain This list may not describe all possible side effects. Call your doctor for medical advice about side effects. You may report side effects to FDA at 1-800-FDA-1088. Where should I keep my medication? This medication is given in a hospital or clinic. It will not be stored at home. NOTE: This sheet is a summary. It may not cover all possible information. If you have questions about this medicine, talk to your doctor, pharmacist, or health care provider.  2023 Elsevier/Gold Standard (2021-04-21 00:00:00)

## 2021-12-26 NOTE — Telephone Encounter (Signed)
Oral Oncology Patient Advocate Encounter   Began application for manufacturer assistance for Venclexta through [place].   Application will be submitted upon completion of necessary supporting documentation.   [employer]'s phone number [phone].   I will continue to check the status until final determination.   Berdine Addison, [facility] Patient [place]  [phone] (phone) [phone] (fax)

## 2021-12-27 ENCOUNTER — Telehealth: Payer: Self-pay | Admitting: Emergency Medicine

## 2021-12-27 NOTE — Telephone Encounter (Signed)
Oral Oncology Patient Advocate Encounter   Submitted application for assistance for Venclexta to [employer].   Application submitted via e-fax to [phone]   [employer]'s phone number [phone].   I will continue to check the status until final determination.   Berdine Addison, [facility] Patient [place]  [phone] (phone) [phone] (fax)

## 2021-12-27 NOTE — Telephone Encounter (Signed)
24 Hour Callback 24 Hour Callback post 1st time Kyprolis infusion. Pt reports no N/V/D. Is able to eat and drink without any issues. No skin rashes.  Pt had no questions but was advised to call with any questions or complications.

## 2021-12-29 ENCOUNTER — Telehealth: Payer: Self-pay | Admitting: *Deleted

## 2021-12-29 ENCOUNTER — Encounter: Payer: Self-pay | Admitting: Oncology

## 2021-12-29 ENCOUNTER — Other Ambulatory Visit: Payer: Self-pay

## 2021-12-29 ENCOUNTER — Other Ambulatory Visit (HOSPITAL_COMMUNITY): Payer: Self-pay

## 2021-12-29 NOTE — Telephone Encounter (Signed)
Call from  nurse case manager at [employer] requesting treatment plan on [name]. Returned call and left VM with current treatment plans per office note of Dr. Montel Clock at [facility] dated 12/15/2021

## 2021-12-30 ENCOUNTER — Encounter: Payer: Self-pay | Admitting: Oncology

## 2021-12-30 ENCOUNTER — Other Ambulatory Visit (HOSPITAL_COMMUNITY): Payer: Self-pay

## 2021-12-30 NOTE — Telephone Encounter (Signed)
Oral Chemotherapy Pharmacist Encounter  [facility] will deliver medication to [name] on 01/02/22.  Patient Education I spoke with patient for overview of new oral chemotherapy medication: Venclexta (venetoclax) for the treatment of progressive light chain multiple myeloma in conjunction with carfilzomib and dexamethasone, planned duration until disease control or unacceptable drug toxicity.  Counseled patient on administration, dosing, side effects, monitoring, drug-food interactions, safe handling, storage, and disposal. Patient will take 2 tablets (200 mg total) by mouth daily. Tablets should be swallowed whole with a meal and a full glass of water.  Side effects include but not limited to: nausea, fatigue, decreased wbc, diarrhea.    Reviewed with patient importance of keeping a medication schedule and plan for any missed doses.  After discussion with patient no patient barriers to medication adherence identified.   [name] voiced understanding and appreciation. All questions answered. Medication handout provided.  Provided patient with [facility] phone number. Patient knows to call the office with questions or concerns. Oral Chemotherapy Navigation Clinic will continue to follow.  Darl Pikes, PharmD, BCPS, BCOP, CPP Hematology/Oncology Clinical Pharmacist Practitioner [facility] [phone]  12/30/2021 11:45 AM

## 2021-12-31 ENCOUNTER — Other Ambulatory Visit: Payer: Self-pay | Admitting: Oncology

## 2022-01-02 ENCOUNTER — Inpatient Hospital Stay (HOSPITAL_BASED_OUTPATIENT_CLINIC_OR_DEPARTMENT_OTHER): Payer: BLUE CROSS/BLUE SHIELD | Admitting: Oncology

## 2022-01-02 ENCOUNTER — Inpatient Hospital Stay: Payer: BLUE CROSS/BLUE SHIELD

## 2022-01-02 ENCOUNTER — Encounter: Payer: Self-pay | Admitting: *Deleted

## 2022-01-02 ENCOUNTER — Other Ambulatory Visit (HOSPITAL_COMMUNITY): Payer: Self-pay

## 2022-01-02 VITALS — BP 138/81 | HR 71

## 2022-01-02 VITALS — BP 135/83 | HR 77 | Temp 98.2°F | Resp 18 | Ht 68.0 in | Wt 178.8 lb

## 2022-01-02 DIAGNOSIS — C9000 Multiple myeloma not having achieved remission: Secondary | ICD-10-CM | POA: Diagnosis not present

## 2022-01-02 LAB — CMP (CANCER CENTER ONLY)
ALT: 9 U/L (ref 0–44)
AST: 10 U/L — ABNORMAL LOW (ref 15–41)
Albumin: 4.3 g/dL (ref 3.5–5.0)
Alkaline Phosphatase: 60 U/L (ref 38–126)
Anion gap: 13 (ref 5–15)
BUN: 25 mg/dL — ABNORMAL HIGH (ref 6–20)
CO2: 24 mmol/L (ref 22–32)
Calcium: 9.6 mg/dL (ref 8.9–10.3)
Chloride: 99 mmol/L (ref 98–111)
Creatinine: 1.14 mg/dL (ref 0.61–1.24)
GFR, Estimated: >60 mL/min (ref >60)
Glucose, Bld: 98 mg/dL (ref 70–99)
Potassium: 3.4 mmol/L — ABNORMAL LOW (ref 3.5–5.1)
Sodium: 136 mmol/L (ref 135–145)
Total Bilirubin: 0.2 mg/dL — ABNORMAL LOW (ref 0.3–1.2)
Total Protein: 6.9 g/dL (ref 6.5–8.1)

## 2022-01-02 LAB — CBC WITH DIFFERENTIAL (CANCER CENTER ONLY)
Abs Immature Granulocytes: 0.06 10*3/uL (ref 0.00–0.07)
Basophils Absolute: 0.1 10*3/uL (ref 0.0–0.1)
Basophils Relative: 1 %
Eosinophils Absolute: 0.3 10*3/uL (ref 0.0–0.5)
Eosinophils Relative: 2 %
HCT: 38.5 % — ABNORMAL LOW (ref 39.0–52.0)
Hemoglobin: 13.3 g/dL (ref 13.0–17.0)
Immature Granulocytes: 0 %
Lymphocytes Relative: 29 %
Lymphs Abs: 4.1 10*3/uL — ABNORMAL HIGH (ref 0.7–4.0)
MCH: 33.5 pg (ref 26.0–34.0)
MCHC: 34.5 g/dL (ref 30.0–36.0)
MCV: 97 fL (ref 80.0–100.0)
Monocytes Absolute: 1 10*3/uL (ref 0.1–1.0)
Monocytes Relative: 7 %
Neutro Abs: 8.6 10*3/uL — ABNORMAL HIGH (ref 1.7–7.7)
Neutrophils Relative %: 61 %
Platelet Count: 251 10*3/uL (ref 150–400)
RBC: 3.97 MIL/uL — ABNORMAL LOW (ref 4.22–5.81)
RDW: 15.6 % — ABNORMAL HIGH (ref 11.5–15.5)
WBC Count: 14.2 10*3/uL — ABNORMAL HIGH (ref 4.0–10.5)
nRBC: 0 % (ref 0.0–0.2)

## 2022-01-02 MED ORDER — SODIUM CHLORIDE 0.9 % IV SOLN: Freq: Once | INTRAVENOUS | Status: AC

## 2022-01-02 MED ORDER — DEXTROSE 5 % IV SOLN
56.0000 mg/m2 | Freq: Once | INTRAVENOUS | Status: AC
  Administered 2022-01-02: 110 mg via INTRAVENOUS
  Filled 2022-01-02: qty 15

## 2022-01-02 MED ORDER — DEXAMETHASONE 4 MG PO TABS: 20.0000 mg | ORAL_TABLET | Freq: Once | ORAL | Status: DC

## 2022-01-02 NOTE — Progress Notes (Signed)
Patient seen by Dr. Sherrill today ? ?Vitals are within treatment parameters. ? ?Labs reviewed by Dr. Sherrill and are within treatment parameters. ? ?Per physician team, patient is ready for treatment and there are NO modifications to the treatment plan.  ?

## 2022-01-02 NOTE — Progress Notes (Signed)
  [facility] OFFICE PROGRESS NOTE   Diagnosis: Multiple myeloma  INTERVAL HISTORY:   Jordan King returns as scheduled.  He completed first treatment with carfilzomib on 12/26/2021.  No sign of allergic reaction.  No dyspnea.  The rash has resolved.  No new complaint.  He continues weekly Decadron.  He is scheduled to receive venetoclax tomorrow.  Objective:  Vital signs in last 24 hours:  Blood pressure 135/83, pulse 77, temperature 98.2 F (36.8 C), temperature source Oral, resp. rate 18, height $RemoveBe'5\' 8"'cMUdQHnTK$  (1.727 m), weight 178 lb 12.8 oz (81.1 kg), SpO2 94 %.    HEENT: No thrush Resp: Lungs clear bilaterally Cardio: Regular rate and rhythm GI: No hepatosplenomegaly Vascular: No leg edema  Skin: Faint faded rash at the lower back bilaterally   Lab Results:  Lab Results  Component Value Date   WBC 14.2 (H) 01/02/2022   HGB 13.3 01/02/2022   HCT 38.5 (L) 01/02/2022   MCV 97.0 01/02/2022   PLT 251 01/02/2022   NEUTROABS 8.6 (H) 01/02/2022    CMP  Lab Results  Component Value Date   NA 136 01/02/2022   K 3.4 (L) 01/02/2022   CL 99 01/02/2022   CO2 24 01/02/2022   GLUCOSE 98 01/02/2022   BUN 25 (H) 01/02/2022   CREATININE 1.14 01/02/2022   CALCIUM 9.6 01/02/2022   PROT 6.9 01/02/2022   ALBUMIN 4.3 01/02/2022   AST 10 (L) 01/02/2022   ALT 9 01/02/2022   ALKPHOS 60 01/02/2022   BILITOT 0.2 (L) 01/02/2022   GFRNONAA >60 01/02/2022    Medications: I have reviewed the patient's current medications.   Assessment/Plan: Multiple myeloma -05/22/2021-kappa free light chain 11.3, lambda free light chain 17,028 -25 g proteinuria, 20 g lambda light chains -Serum immunofixation 05/22/2021-monoclonal lambda light chains -Bone marrow biopsy 05/23/2021-hypercellular marrow involved by plasma cell myeloma, 80-90% plasma cells on the bone marrow biopsy, diminished iron stains -Cytogenetics-45 X, minus Y, t11:14 (11 q13) -Cycle 1 Velcade/Decadron 05/23/2021 (Decadron given  05/22/2021) -Cycle 1 DRVd 06/13/2021, treatment held 06/20/2021 due to a rash, Revlimid resumed 06/28/2021, daratumumab resumed 07/04/2021 Revlimid resumed 06/28/2021 Cycle 2 daratumumab/Revlimid/Decadron/Velcade 07/18/2021 (Revlimid started 07/19/2021, placed on hold 07/25/2021 secondary to a rash-last taken 07/24/2021) Treatment continued with weekly Velcade/Decadron and daratumumab Treatment changed to pomalidomide, daratumumab, Decadron 10/03/2021 Pomalidomide placed on hold 10/17/2021 due to a rash Pomalidomide resumed at a reduced dose of 2 mg daily for 21 days followed by 7-day break 10/31/2021 Pomalidomide 2 mg daily for 21 days beginning 11/28/2021 Lambda light chains increased 12/12/2021 Venetoclax/Carfilzomib/Decadron 12/26/2021, venetoclax started 01/05/2022   Renal failure secondary to #1-improved Hypercalcemia-status post treat with calcitonin, pamidronate 05/22/2021-resolved Lytic bone lesions Anemia Thrombocytopenia Hypocalcemia-IV calcium gluconate 05/30/2021, resolved Rash 06/20/2021, appears consistent with a drug rash, recurrent rash 07/24/2021-Revlimid placed on hold; recurrent rash 10/17/2021     Disposition: Jordan King appears stable.  He tolerated the first treatment with carfilzomib well.  The carfilzomib will be escalated to 56 mg per metered squared beginning with treatment today.  He will continue carfilzomib on a day 1, 8, 15 schedule with a 28-day cycle.  He will begin venetoclax on 01/05/2022.  He will return for lab visit and carfilzomib on 01/09/2022.  He will be scheduled for an office visit on 01/23/2022.  Jordan King declines influenza and COVID-19 vaccines.  He continues valacyclovir prophylaxis and will begin Bactrim, Levaquin, and Diflucan this week.  Betsy Coder, MD  01/02/2022  12:09 PM

## 2022-01-02 NOTE — Patient Instructions (Signed)
[place]   Discharge Instructions: Thank you for choosing [facility] to provide your oncology and hematology care.   If you have a lab appointment with the [facility], please go directly to the [facility] and check in at the registration area.   Wear comfortable clothing and clothing appropriate for easy access to any Portacath or PICC line.   We strive to give you quality time with your provider. You may need to reschedule your appointment if you arrive late (15 or more minutes).  Arriving late affects you and other patients whose appointments are after yours.  Also, if you miss three or more appointments without notifying the office, you may be dismissed from the clinic at the provider's discretion.      For prescription refill requests, have your pharmacy contact our office and allow 72 hours for refills to be completed.    Today you received the following chemotherapy and/or immunotherapy agents Kyprolis.      To help prevent nausea and vomiting after your treatment, we encourage you to take your nausea medication as directed.  BELOW ARE SYMPTOMS THAT SHOULD BE REPORTED IMMEDIATELY: *FEVER GREATER THAN 100.4 F (38 C) OR HIGHER *CHILLS OR SWEATING *NAUSEA AND VOMITING THAT IS NOT CONTROLLED WITH YOUR NAUSEA MEDICATION *UNUSUAL SHORTNESS OF BREATH *UNUSUAL BRUISING OR BLEEDING *URINARY PROBLEMS (pain or burning when urinating, or frequent urination) *BOWEL PROBLEMS (unusual diarrhea, constipation, pain near the anus) TENDERNESS IN MOUTH AND THROAT WITH OR WITHOUT PRESENCE OF ULCERS (sore throat, sores in mouth, or a toothache) UNUSUAL RASH, SWELLING OR PAIN  UNUSUAL VAGINAL DISCHARGE OR ITCHING   Items with * indicate a potential emergency and should be followed up as soon as possible or go to the Emergency Department if any problems should occur.  Please show the CHEMOTHERAPY ALERT CARD or IMMUNOTHERAPY ALERT CARD at check-in to  the Emergency Department and triage nurse.  Should you have questions after your visit or need to cancel or reschedule your appointment, please contact [facility]  Dept: [phone]  and follow the prompts.  Office hours are 8:00 a.m. to 4:30 p.m. Monday - Friday. Please note that voicemails left after 4:00 p.m. may not be returned until the following business day.  We are closed weekends and major holidays. You have access to a nurse at all times for urgent questions. Please call the main number to the clinic Dept: [phone] and follow the prompts.   For any non-urgent questions, you may also contact your provider using MyChart. We now offer e-Visits for anyone 58 and older to request care online for non-urgent symptoms. For details visit [email or web address].   Also download the MyChart app! Go to the app store, search "MyChart", open the app, select [facility], and log in with your MyChart username and password.  Masks are optional in the cancer centers. If you would like for your care team to wear a mask while they are taking care of you, please let them know. You may have one support person who is at least 50 years old accompany you for your appointments.  Carfilzomib Injection What is this medication? CARFILZOMIB (kar FILZ oh mib) treats multiple myeloma, a type of bone marrow cancer. It works by blocking a protein that causes cancer cells to grow and multiply. This helps to slow or stop the spread of cancer cells. This medicine may be used for other purposes; ask your health care provider or pharmacist  if you have questions. COMMON BRAND NAME(S): KYPROLIS What should I tell my care team before I take this medication? They need to know if you have any of these conditions: Heart disease History of blood clots Irregular heartbeat Kidney disease Liver disease Lung or breathing disease An unusual or allergic reaction to carfilzomib, or other medications,  foods, dyes, or preservatives If you or your partner are pregnant or trying to get pregnant Breastfeeding How should I use this medication? This medication is injected into a vein. It is given by your care team in a hospital or clinic setting. Talk to your care team about the use of this medication in children. Special care may be needed. Overdosage: If you think you have taken too much of this medicine contact a poison control center or emergency room at once. NOTE: This medicine is only for you. Do not share this medicine with others. What if I miss a dose? Keep appointments for follow-up doses. It is important not to miss your dose. Call your care team if you are unable to keep an appointment. What may interact with this medication? Interactions are not expected. This list may not describe all possible interactions. Give your health care provider a list of all the medicines, herbs, non-prescription drugs, or dietary supplements you use. Also tell them if you smoke, drink alcohol, or use illegal drugs. Some items may interact with your medicine. What should I watch for while using this medication? Your condition will be monitored carefully while you are receiving this medication. You may need blood work while taking this medication. Check with your care team if you have severe diarrhea, nausea, and vomiting, or if you sweat a lot. The loss of too much body fluid may make it dangerous for you to take this medication. This medication may affect your coordination, reaction time, or judgment. Do not drive or operate machinery until you know how this medication affects you. Sit up or stand slowly to reduce the risk of dizzy or fainting spells. Drinking alcohol with this medication can increase the risk of these side effects. Talk to your care team if you may be pregnant. Serious birth defects can occur if you take this medication during pregnancy and for 6 months after the last dose. You will need a  negative pregnancy test before starting this medication. Contraception is recommended while taking this medication and for 6 months after the last dose. Your care team can help you find an option that works for you. If your partner can get pregnant, use a condom during sex while taking this medication and for 3 months after the last dose. Do not breastfeed while taking this medication and for 2 weeks after the last dose. This medication may cause infertility. Talk to your care team if you are concerned about your fertility. What side effects may I notice from receiving this medication? Side effects that you should report to your care team as soon as possible: Allergic reactions--skin rash, itching, hives, swelling of the face, lips, tongue, or throat Bleeding--bloody or black, tar-like stools, vomiting blood or brown material that looks like coffee grounds, red or dark brown urine, small red or purple spots on skin, unusual bruising or bleeding Blood clot--pain, swelling, or warmth in the leg, shortness of breath, chest pain Dizziness, loss of balance or coordination, confusion or trouble speaking Heart attack--pain or tightness in the chest, shoulders, arms, or jaw, nausea, shortness of breath, cold or clammy skin, feeling faint or lightheaded Heart failure--shortness  of breath, swelling of the ankles, feet, or hands, sudden weight gain, unusual weakness or fatigue Heart rhythm changes--fast or irregular heartbeat, dizziness, feeling faint or lightheaded, chest pain, trouble breathing Increase in blood pressure Infection--fever, chills, cough, sore throat, wounds that don't heal, pain or trouble when passing urine, general feeling of discomfort or being unwell Infusion reactions--chest pain, shortness of breath or trouble breathing, feeling faint or lightheaded Kidney injury--decrease in the amount of urine, swelling of the ankles, hands, or feet Liver injury--right upper belly pain, loss of  appetite, nausea, light-colored stool, dark yellow or brown urine, yellowing skin or eyes, unusual weakness or fatigue Lung injury--shortness of breath or trouble breathing, cough, spitting up blood, chest pain, fever Pulmonary hypertension--shortness of breath, chest pain, fast or irregular heartbeat, feeling faint or lightheaded, fatigue, swelling of the ankles or feet Stomach pain, bloody diarrhea, pale skin, unusual weakness or fatigue, decrease in the amount of urine, which may be signs of hemolytic uremic syndrome Sudden and severe headache, confusion, change in vision, seizures, which may be signs of posterior reversible encephalopathy syndrome (PRES) TTP--purple spots on the skin or inside the mouth, pale skin, yellowing skin or eyes, unusual weakness or fatigue, fever, fast or irregular heartbeat, confusion, change in vision, trouble speaking, trouble walking Tumor lysis syndrome (TLS)--nausea, vomiting, diarrhea, decrease in the amount of urine, dark urine, unusual weakness or fatigue, confusion, muscle pain or cramps, fast or irregular heartbeat, joint pain Side effects that usually do not require medical attention (report to your care team if they continue or are bothersome): Diarrhea Fatigue Nausea Trouble sleeping This list may not describe all possible side effects. Call your doctor for medical advice about side effects. You may report side effects to FDA at 1-800-FDA-1088. Where should I keep my medication? This medication is given in a hospital or clinic. It will not be stored at home. NOTE: This sheet is a summary. It may not cover all possible information. If you have questions about this medicine, talk to your doctor, pharmacist, or health care provider.  2023 Elsevier/Gold Standard (2021-08-03 00:00:00)

## 2022-01-03 ENCOUNTER — Other Ambulatory Visit (HOSPITAL_COMMUNITY): Payer: Self-pay

## 2022-01-03 ENCOUNTER — Other Ambulatory Visit: Payer: Self-pay

## 2022-01-03 NOTE — Telephone Encounter (Signed)
Oral Oncology Patient Advocate Encounter   Received notification that the application for assistance for Venclexta through [employer] has been approved.   [employer]'s phone number [phone].   Effective dates: 10.17.23 until further notice.   I have spoken to the patient.  Berdine Addison, [facility] Patient [place]  [phone] (phone) [phone] (fax) 01/03/2022 2:32 PM

## 2022-01-09 ENCOUNTER — Encounter (HOSPITAL_COMMUNITY): Payer: Self-pay

## 2022-01-09 ENCOUNTER — Inpatient Hospital Stay: Payer: BLUE CROSS/BLUE SHIELD

## 2022-01-09 ENCOUNTER — Inpatient Hospital Stay: Payer: BLUE CROSS/BLUE SHIELD | Admitting: Oncology

## 2022-01-09 ENCOUNTER — Observation Stay (HOSPITAL_COMMUNITY): Payer: BLUE CROSS/BLUE SHIELD

## 2022-01-09 ENCOUNTER — Inpatient Hospital Stay (HOSPITAL_BASED_OUTPATIENT_CLINIC_OR_DEPARTMENT_OTHER): Payer: BLUE CROSS/BLUE SHIELD | Admitting: Nurse Practitioner

## 2022-01-09 ENCOUNTER — Inpatient Hospital Stay (HOSPITAL_COMMUNITY)
Admit: 2022-01-09 | Discharge: 2022-01-12 | DRG: 683 | Disposition: A | Discharge status: Home / Self Care | Payer: BLUE CROSS/BLUE SHIELD | Source: Ambulatory Visit | Attending: Family Medicine | Admitting: Family Medicine

## 2022-01-09 VITALS — BP 122/80 | HR 89 | Temp 97.9°F | Resp 18 | Ht 68.0 in | Wt 178.4 lb

## 2022-01-09 DIAGNOSIS — W540XXD Bitten by dog, subsequent encounter: Secondary | ICD-10-CM | POA: Diagnosis not present

## 2022-01-09 DIAGNOSIS — S51812A Laceration without foreign body of left forearm, initial encounter: Secondary | ICD-10-CM | POA: Diagnosis present

## 2022-01-09 DIAGNOSIS — J45909 Unspecified asthma, uncomplicated: Secondary | ICD-10-CM | POA: Diagnosis present

## 2022-01-09 DIAGNOSIS — Z79899 Other long term (current) drug therapy: Secondary | ICD-10-CM

## 2022-01-09 DIAGNOSIS — T451X5A Adverse effect of antineoplastic and immunosuppressive drugs, initial encounter: Secondary | ICD-10-CM | POA: Diagnosis present

## 2022-01-09 DIAGNOSIS — Z888 Allergy status to other drugs, medicaments and biological substances status: Secondary | ICD-10-CM

## 2022-01-09 DIAGNOSIS — F419 Anxiety disorder, unspecified: Secondary | ICD-10-CM | POA: Diagnosis not present

## 2022-01-09 DIAGNOSIS — K219 Gastro-esophageal reflux disease without esophagitis: Secondary | ICD-10-CM | POA: Diagnosis present

## 2022-01-09 DIAGNOSIS — E790 Hyperuricemia without signs of inflammatory arthritis and tophaceous disease: Secondary | ICD-10-CM | POA: Diagnosis present

## 2022-01-09 DIAGNOSIS — R21 Rash and other nonspecific skin eruption: Secondary | ICD-10-CM | POA: Diagnosis present

## 2022-01-09 DIAGNOSIS — C9000 Multiple myeloma not having achieved remission: Secondary | ICD-10-CM | POA: Diagnosis not present

## 2022-01-09 DIAGNOSIS — Z7982 Long term (current) use of aspirin: Secondary | ICD-10-CM

## 2022-01-09 DIAGNOSIS — F17210 Nicotine dependence, cigarettes, uncomplicated: Secondary | ICD-10-CM | POA: Diagnosis present

## 2022-01-09 DIAGNOSIS — Z7969 Long term (current) use of other immunomodulators and immunosuppressants: Secondary | ICD-10-CM

## 2022-01-09 DIAGNOSIS — E883 Tumor lysis syndrome: Secondary | ICD-10-CM | POA: Diagnosis present

## 2022-01-09 DIAGNOSIS — D696 Thrombocytopenia, unspecified: Secondary | ICD-10-CM | POA: Diagnosis present

## 2022-01-09 DIAGNOSIS — N179 Acute kidney failure, unspecified: Principal | ICD-10-CM | POA: Diagnosis present

## 2022-01-09 DIAGNOSIS — W540XXA Bitten by dog, initial encounter: Secondary | ICD-10-CM

## 2022-01-09 DIAGNOSIS — T380X5A Adverse effect of glucocorticoids and synthetic analogues, initial encounter: Secondary | ICD-10-CM | POA: Diagnosis present

## 2022-01-09 DIAGNOSIS — D649 Anemia, unspecified: Secondary | ICD-10-CM | POA: Diagnosis present

## 2022-01-09 DIAGNOSIS — E871 Hypo-osmolality and hyponatremia: Secondary | ICD-10-CM | POA: Diagnosis present

## 2022-01-09 DIAGNOSIS — Z885 Allergy status to narcotic agent status: Secondary | ICD-10-CM

## 2022-01-09 LAB — CMP (CANCER CENTER ONLY)
ALT: 12 U/L (ref 0–44)
AST: 20 U/L (ref 15–41)
Albumin: 4.2 g/dL (ref 3.5–5.0)
Alkaline Phosphatase: 53 U/L (ref 38–126)
Anion gap: 14 (ref 5–15)
BUN: 28 mg/dL — ABNORMAL HIGH (ref 6–20)
CO2: 20 mmol/L — ABNORMAL LOW (ref 22–32)
Calcium: 9.6 mg/dL (ref 8.9–10.3)
Chloride: 99 mmol/L (ref 98–111)
Creatinine: 2.96 mg/dL — ABNORMAL HIGH (ref 0.61–1.24)
GFR, Estimated: 25 mL/min — ABNORMAL LOW (ref >60)
Glucose, Bld: 125 mg/dL — ABNORMAL HIGH (ref 70–99)
Potassium: 3.9 mmol/L (ref 3.5–5.1)
Sodium: 133 mmol/L — ABNORMAL LOW (ref 135–145)
Total Bilirubin: 0.5 mg/dL (ref 0.3–1.2)
Total Protein: 6.7 g/dL (ref 6.5–8.1)

## 2022-01-09 LAB — CBC WITH DIFFERENTIAL (CANCER CENTER ONLY)
Abs Immature Granulocytes: 0.07 10*3/uL (ref 0.00–0.07)
Basophils Absolute: 0 10*3/uL (ref 0.0–0.1)
Basophils Relative: 0 %
Eosinophils Absolute: 0.2 10*3/uL (ref 0.0–0.5)
Eosinophils Relative: 1 %
HCT: 39.2 % (ref 39.0–52.0)
Hemoglobin: 13.5 g/dL (ref 13.0–17.0)
Immature Granulocytes: 1 %
Lymphocytes Relative: 7 %
Lymphs Abs: 1 10*3/uL (ref 0.7–4.0)
MCH: 32.8 pg (ref 26.0–34.0)
MCHC: 34.4 g/dL (ref 30.0–36.0)
MCV: 95.4 fL (ref 80.0–100.0)
Monocytes Absolute: 1.4 10*3/uL — ABNORMAL HIGH (ref 0.1–1.0)
Monocytes Relative: 10 %
Neutro Abs: 11.8 10*3/uL — ABNORMAL HIGH (ref 1.7–7.7)
Neutrophils Relative %: 81 %
Platelet Count: 160 10*3/uL (ref 150–400)
RBC: 4.11 MIL/uL — ABNORMAL LOW (ref 4.22–5.81)
RDW: 15.7 % — ABNORMAL HIGH (ref 11.5–15.5)
WBC Count: 14.4 10*3/uL — ABNORMAL HIGH (ref 4.0–10.5)
nRBC: 0 % (ref 0.0–0.2)

## 2022-01-09 LAB — URINALYSIS, ROUTINE W REFLEX MICROSCOPIC
Bilirubin Urine: NEGATIVE
Glucose, UA: NEGATIVE mg/dL
Ketones, ur: NEGATIVE mg/dL
Leukocytes,Ua: NEGATIVE
Nitrite: NEGATIVE
Protein, ur: NEGATIVE mg/dL
Specific Gravity, Urine: <1.005 — ABNORMAL LOW (ref 1.005–1.030)
pH: 5 (ref 5.0–8.0)

## 2022-01-09 LAB — LACTATE DEHYDROGENASE: LDH: 188 U/L (ref 98–192)

## 2022-01-09 LAB — URIC ACID: Uric Acid, Serum: 17.3 mg/dL — ABNORMAL HIGH (ref 3.7–8.6)

## 2022-01-09 LAB — PHOSPHORUS: Phosphorus: 4 mg/dL (ref 2.5–4.6)

## 2022-01-09 MED ORDER — ACETAMINOPHEN 650 MG RE SUPP
650.0000 mg | Freq: Four times a day (QID) | RECTAL | Status: DC | PRN
  Filled 2022-01-09: qty 1

## 2022-01-09 MED ORDER — NICOTINE 21 MG/24HR TD PT24: 21.0000 mg | MEDICATED_PATCH | Freq: Every day | TRANSDERMAL | Status: DC

## 2022-01-09 MED ORDER — ACETAMINOPHEN 325 MG PO TABS: 650.0000 mg | ORAL_TABLET | Freq: Four times a day (QID) | ORAL | Status: DC | PRN

## 2022-01-09 MED ORDER — ENOXAPARIN SODIUM 30 MG/0.3ML IJ SOSY
30.0000 mg | PREFILLED_SYRINGE | INTRAMUSCULAR | Status: DC
  Filled 2022-01-09 (×2): qty 0.3

## 2022-01-09 MED ORDER — PANTOPRAZOLE SODIUM 20 MG PO TBEC
20.0000 mg | DELAYED_RELEASE_TABLET | Freq: Every day | ORAL | Status: DC
  Administered 2022-01-10 – 2022-01-11 (×2): 20 mg via ORAL
  Filled 2022-01-09 (×3): qty 1

## 2022-01-09 MED ORDER — FLUCONAZOLE 100 MG PO TABS
200.0000 mg | ORAL_TABLET | Freq: Every day | ORAL | Status: DC
  Administered 2022-01-09: 200 mg via ORAL
  Filled 2022-01-09 (×3): qty 2

## 2022-01-09 MED ORDER — SULFAMETHOXAZOLE-TRIMETHOPRIM 800-160 MG PO TABS: 1.0000 | ORAL_TABLET | ORAL | Status: DC

## 2022-01-09 MED ORDER — SODIUM CHLORIDE 0.9 % IV SOLN
6.0000 mg | Freq: Once | INTRAVENOUS | Status: AC
  Administered 2022-01-09: 6 mg via INTRAVENOUS
  Filled 2022-01-09: qty 4

## 2022-01-09 MED ORDER — LORAZEPAM 0.5 MG PO TABS: 0.5000 mg | ORAL_TABLET | Freq: Three times a day (TID) | ORAL | Status: DC | PRN

## 2022-01-09 MED ORDER — SODIUM CHLORIDE 0.9 % IV SOLN: Freq: Once | INTRAVENOUS | Status: AC

## 2022-01-09 MED ORDER — SODIUM CHLORIDE 0.9 % IV SOLN: INTRAVENOUS | Status: DC

## 2022-01-09 MED ORDER — OXYCODONE-ACETAMINOPHEN 5-325 MG PO TABS: 1.0000 | ORAL_TABLET | Freq: Four times a day (QID) | ORAL | Status: DC | PRN

## 2022-01-09 MED ORDER — NICOTINE 21 MG/24HR TD PT24
21.0000 mg | MEDICATED_PATCH | Freq: Every day | TRANSDERMAL | Status: DC
  Administered 2022-01-09 – 2022-01-11 (×3): 21 mg via TRANSDERMAL
  Filled 2022-01-09 (×4): qty 1

## 2022-01-09 MED ORDER — LORAZEPAM 0.5 MG PO TABS
0.5000 mg | ORAL_TABLET | Freq: Three times a day (TID) | ORAL | Status: DC | PRN
  Administered 2022-01-10 – 2022-01-11 (×2): 0.5 mg via ORAL
  Filled 2022-01-09 (×2): qty 1

## 2022-01-09 MED ORDER — VALACYCLOVIR HCL 500 MG PO TABS
500.0000 mg | ORAL_TABLET | Freq: Two times a day (BID) | ORAL | Status: DC
  Administered 2022-01-09 – 2022-01-12 (×6): 500 mg via ORAL
  Filled 2022-01-09 (×6): qty 1

## 2022-01-09 MED ORDER — OXYCODONE-ACETAMINOPHEN 5-325 MG PO TABS
1.0000 | ORAL_TABLET | Freq: Four times a day (QID) | ORAL | Status: DC | PRN
  Administered 2022-01-09: 1 via ORAL
  Filled 2022-01-09: qty 1

## 2022-01-09 MED ORDER — ALBUTEROL SULFATE (2.5 MG/3ML) 0.083% IN NEBU: 2.5000 mg | INHALATION_SOLUTION | Freq: Four times a day (QID) | RESPIRATORY_TRACT | Status: DC | PRN

## 2022-01-09 MED ORDER — LEVOFLOXACIN 500 MG PO TABS: 500.0000 mg | ORAL_TABLET | Freq: Every evening | ORAL | Status: DC

## 2022-01-09 MED ORDER — POLYETHYLENE GLYCOL 3350 17 G PO PACK
17.0000 g | PACK | Freq: Every day | ORAL | Status: DC | PRN
  Administered 2022-01-10: 17 g via ORAL
  Filled 2022-01-09: qty 1

## 2022-01-09 MED ORDER — ASPIRIN 81 MG PO TBEC
81.0000 mg | DELAYED_RELEASE_TABLET | Freq: Every day | ORAL | Status: DC
  Administered 2022-01-09 – 2022-01-11 (×3): 81 mg via ORAL
  Filled 2022-01-09 (×3): qty 1

## 2022-01-09 MED ORDER — SODIUM CHLORIDE 0.9% FLUSH
3.0000 mL | Freq: Two times a day (BID) | INTRAVENOUS | Status: DC
  Administered 2022-01-10: 3 mL via INTRAVENOUS

## 2022-01-09 NOTE — Patient Instructions (Signed)
[facility]   Discharge Instructions: Thank you for choosing [facility] to provide your oncology and hematology care.   If you have a lab appointment with the Cancer Center, please go directly to the Cancer Center and check in at the registration area.   Wear comfortable clothing and clothing appropriate for easy access to any Portacath or PICC line.   We strive to give you quality time with your provider. You may need to reschedule your appointment if you arrive late (15 or more minutes).  Arriving late affects you and other patients whose appointments are after yours.  Also, if you miss three or more appointments without notifying the office, you may be dismissed from the clinic at the provider's discretion.      For prescription refill requests, have your pharmacy contact our office and allow 72 hours for refills to be completed.    Today you received the following chemotherapy and/or immunotherapy agents Kyprolis.      To help prevent nausea and vomiting after your treatment, we encourage you to take your nausea medication as directed.  BELOW ARE SYMPTOMS THAT SHOULD BE REPORTED IMMEDIATELY: *FEVER GREATER THAN 100.4 F (38 C) OR HIGHER *CHILLS OR SWEATING *NAUSEA AND VOMITING THAT IS NOT CONTROLLED WITH YOUR NAUSEA MEDICATION *UNUSUAL SHORTNESS OF BREATH *UNUSUAL BRUISING OR BLEEDING *URINARY PROBLEMS (pain or burning when urinating, or frequent urination) *BOWEL PROBLEMS (unusual diarrhea, constipation, pain near the anus) TENDERNESS IN MOUTH AND THROAT WITH OR WITHOUT PRESENCE OF ULCERS (sore throat, sores in mouth, or a toothache) UNUSUAL RASH, SWELLING OR PAIN  UNUSUAL VAGINAL DISCHARGE OR ITCHING   Items with * indicate a potential emergency and should be followed up as soon as possible or go to the Emergency Department if any problems should occur.  Please show the CHEMOTHERAPY ALERT CARD or IMMUNOTHERAPY ALERT CARD at check-in to  the Emergency Department and triage nurse.  Should you have questions after your visit or need to cancel or reschedule your appointment, please contact [facility]  Dept: [phone]  and follow the prompts.  Office hours are 8:00 a.m. to 4:30 p.m. Monday - Friday. Please note that voicemails left after 4:00 p.m. may not be returned until the following business day.  We are closed weekends and major holidays. You have access to a nurse at all times for urgent questions. Please call the main number to the clinic Dept: [phone] and follow the prompts.   For any non-urgent questions, you may also contact your provider using MyChart. We now offer e-Visits for anyone 18 and older to request care online for non-urgent symptoms. For details visit [email or web address].   Also download the MyChart app! Go to the app store, search "MyChart", open the app, select [facility], and log in with your MyChart username and password.  Masks are optional in the cancer centers. If you would like for your care team to wear a mask while they are taking care of you, please let them know. You may have one support person who is at least 50 years old accompany you for your appointments.  Carfilzomib Injection What is this medication? CARFILZOMIB (kar FILZ oh mib) treats multiple myeloma, a type of bone marrow cancer. It works by blocking a protein that causes cancer cells to grow and multiply. This helps to slow or stop the spread of cancer cells. This medicine may be used for other purposes; ask your health care provider or pharmacist   if you have questions. COMMON BRAND NAME(S): KYPROLIS What should I tell my care team before I take this medication? They need to know if you have any of these conditions: Heart disease History of blood clots Irregular heartbeat Kidney disease Liver disease Lung or breathing disease An unusual or allergic reaction to carfilzomib, or other medications,  foods, dyes, or preservatives If you or your partner are pregnant or trying to get pregnant Breastfeeding How should I use this medication? This medication is injected into a vein. It is given by your care team in a hospital or clinic setting. Talk to your care team about the use of this medication in children. Special care may be needed. Overdosage: If you think you have taken too much of this medicine contact a poison control center or emergency room at once. NOTE: This medicine is only for you. Do not share this medicine with others. What if I miss a dose? Keep appointments for follow-up doses. It is important not to miss your dose. Call your care team if you are unable to keep an appointment. What may interact with this medication? Interactions are not expected. This list may not describe all possible interactions. Give your health care provider a list of all the medicines, herbs, non-prescription drugs, or dietary supplements you use. Also tell them if you smoke, drink alcohol, or use illegal drugs. Some items may interact with your medicine. What should I watch for while using this medication? Your condition will be monitored carefully while you are receiving this medication. You may need blood work while taking this medication. Check with your care team if you have severe diarrhea, nausea, and vomiting, or if you sweat a lot. The loss of too much body fluid may make it dangerous for you to take this medication. This medication may affect your coordination, reaction time, or judgment. Do not drive or operate machinery until you know how this medication affects you. Sit up or stand slowly to reduce the risk of dizzy or fainting spells. Drinking alcohol with this medication can increase the risk of these side effects. Talk to your care team if you may be pregnant. Serious birth defects can occur if you take this medication during pregnancy and for 6 months after the last dose. You will need a  negative pregnancy test before starting this medication. Contraception is recommended while taking this medication and for 6 months after the last dose. Your care team can help you find an option that works for you. If your partner can get pregnant, use a condom during sex while taking this medication and for 3 months after the last dose. Do not breastfeed while taking this medication and for 2 weeks after the last dose. This medication may cause infertility. Talk to your care team if you are concerned about your fertility. What side effects may I notice from receiving this medication? Side effects that you should report to your care team as soon as possible: Allergic reactions--skin rash, itching, hives, swelling of the face, lips, tongue, or throat Bleeding--bloody or black, tar-like stools, vomiting blood or brown material that looks like coffee grounds, red or dark brown urine, small red or purple spots on skin, unusual bruising or bleeding Blood clot--pain, swelling, or warmth in the leg, shortness of breath, chest pain Dizziness, loss of balance or coordination, confusion or trouble speaking Heart attack--pain or tightness in the chest, shoulders, arms, or jaw, nausea, shortness of breath, cold or clammy skin, feeling faint or lightheaded Heart failure--shortness  of breath, swelling of the ankles, feet, or hands, sudden weight gain, unusual weakness or fatigue Heart rhythm changes--fast or irregular heartbeat, dizziness, feeling faint or lightheaded, chest pain, trouble breathing Increase in blood pressure Infection--fever, chills, cough, sore throat, wounds that don't heal, pain or trouble when passing urine, general feeling of discomfort or being unwell Infusion reactions--chest pain, shortness of breath or trouble breathing, feeling faint or lightheaded Kidney injury--decrease in the amount of urine, swelling of the ankles, hands, or feet Liver injury--right upper belly pain, loss of  appetite, nausea, light-colored stool, dark yellow or brown urine, yellowing skin or eyes, unusual weakness or fatigue Lung injury--shortness of breath or trouble breathing, cough, spitting up blood, chest pain, fever Pulmonary hypertension--shortness of breath, chest pain, fast or irregular heartbeat, feeling faint or lightheaded, fatigue, swelling of the ankles or feet Stomach pain, bloody diarrhea, pale skin, unusual weakness or fatigue, decrease in the amount of urine, which may be signs of hemolytic uremic syndrome Sudden and severe headache, confusion, change in vision, seizures, which may be signs of posterior reversible encephalopathy syndrome (PRES) TTP--purple spots on the skin or inside the mouth, pale skin, yellowing skin or eyes, unusual weakness or fatigue, fever, fast or irregular heartbeat, confusion, change in vision, trouble speaking, trouble walking Tumor lysis syndrome (TLS)--nausea, vomiting, diarrhea, decrease in the amount of urine, dark urine, unusual weakness or fatigue, confusion, muscle pain or cramps, fast or irregular heartbeat, joint pain Side effects that usually do not require medical attention (report to your care team if they continue or are bothersome): Diarrhea Fatigue Nausea Trouble sleeping This list may not describe all possible side effects. Call your doctor for medical advice about side effects. You may report side effects to FDA at 1-800-FDA-1088. Where should I keep my medication? This medication is given in a hospital or clinic. It will not be stored at home. NOTE: This sheet is a summary. It may not cover all possible information. If you have questions about this medicine, talk to your doctor, pharmacist, or health care provider.  2023 Elsevier/Gold Standard (2021-08-03 00:00:00)  

## 2022-01-09 NOTE — Progress Notes (Signed)
MEDICATION RELATED CONSULT NOTE - INITIAL   Pharmacy Consult for Rasburicase Indication: Tumor lysis syndrome  Allergies  Allergen Reactions   Hydrocodone-Acetaminophen Shortness Of Breath   Lenalidomide Rash   Pomalidomide Rash    Patient Measurements:   Adjusted Body Weight:   Vital Signs: Temp: 98.4 F (36.9 C) (10/23 1839) Temp Source: Oral (10/23 1839) BP: 144/91 (10/23 1839) Pulse Rate: 110 (10/23 1839)  Labs: Recent Labs    01/09/22 0955  WBC 14.4*  HGB 13.5  HCT 39.2  PLT 160  CREATININE 2.96*  PHOS 4.0  ALBUMIN 4.2  PROT 6.7  AST 20  ALT 12  ALKPHOS 53  BILITOT 0.5   Estimated Creatinine Clearance: 28.9 mL/min (A) (by C-G formula based on SCr of 2.96 mg/dL (H)).   Medical History: Past Medical History:  Diagnosis Date   Asthma     Medications:  Scheduled:  Infusions:   sodium chloride      Assessment: 40 yoM presented [facility] on 10/23 for scheduled Carfilzomib cycle 1 day 15.  He began venetoclax 01/05/2022.  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  Pharmacy is consulted to dose Rasburicase.    Labs:   Uric acid 17.3 K 3.9 Phos 4.0 Ca 9.6 SCr 2.96 (baseline ~ 1.15)   Plan:  Rasburicase '6mg'$  IV x1 IVF per MD - NS @ 125 ml/hr  Gretta Arab PharmD, BCPS [facility] main pharmacy [phone] 01/09/2022 7:14 PM

## 2022-01-09 NOTE — Progress Notes (Signed)
Patient seen infusion room for treatment today.  Patient stated he was attacked by his neighbor's dog and had multiple scrapes and bite marks on bilateral arms and legs.  Patient stated he was trying to save his dog from the neighbor's dog, but got caught in the middle of the attack.  Patient stated EMS was called and he was assessed on site.  Patient stated EMS recommended he go to the ED to be seen, but patient declined stating he knew he was going to be coming in to our office today.  Ned Card, NP made aware and came to infusion room to see patient.  Patient was advised to contact his primary care physician or to go to ED/Urgent care to have his wounds assessed and cleaned.  Patient stated he had been cleaning wounds at home with hydrogen peroxide and neosporin, but stated he will contact his primary care today.    Ned Card, NP informed of patient's Scr level of 2.96.  Verbal order received from [facility] to add on Uric Acid and Lactate dehydrogenase lab.  Lab was notified of added labwork.   Per Dr. Benay Spice and Ned Card, NP, recommendation is for patient to be admitted to monitored Scr level and Uric Acid level.  Patient was informed of recommendation at bedside by both Ned Card, NP and Dr. Benay Spice.  Patient agreed to be admitted for observation but requested to wait on bed assignment at his mother's house.  Per Dr. Benay Spice and Ned Card, NP, ok for patient to wait for bed assignment at his mother's house and to travel via private vehicle to [facility] when bed is assigned.  Roland Rack, LPN made aware and will inform Bed Board to call patient's cell phone with bed assignment.  Patient instructed to go to [facility] ED if he has not heard from Bed Board by 8pm and/or if he starts to develop symptoms of dark colored urine, pain while urinating, or change in LOC. Patient verbalized understanding and agreed.

## 2022-01-09 NOTE — Progress Notes (Signed)
[place] OFFICE PROGRESS NOTE   Diagnosis: Multiple myeloma  INTERVAL HISTORY:   Jordan King is seen in an unscheduled visit due to abnormal labs.  He is here today for Carfilzomib cycle 1 day 15.  He began venetoclax 01/05/2022.  He reports breaking up a fight between his dog and another dog yesterday.  He sustained multiple bites and scratches on both arms.  He was evaluated by EMS and recommended to go to the emergency department.  He declined emergency room evaluation.  He cleaned all of the wounds.  He felt exhausted most of the day yesterday.  No fever.  No difficulty with urination.  No diarrhea.  Objective:  Vital signs in last 24 hours:  Temperature 98, heart rate 71, blood pressure 131/97, respirations 18, oxygen saturation 99%    HEENT: No thrush or ulcers. Resp: Bilateral wheezes. Cardio: Regular rate and rhythm. GI: Abdomen soft and nontender. Vascular: No leg edema. Neuro: Alert and oriented. Skin: Multiple scratches and abrasions bilateral hand/forearm left greater than right.  What appear to be puncture wounds at the right medial wrist have surrounding erythema.  Some of the wounds are bleeding.   Lab Results:  Lab Results  Component Value Date   WBC 14.4 (H) 01/09/2022   HGB 13.5 01/09/2022   HCT 39.2 01/09/2022   MCV 95.4 01/09/2022   PLT 160 01/09/2022   NEUTROABS 11.8 (H) 01/09/2022    Imaging:  No results found.  Medications: I have reviewed the patient's current medications.  Assessment/Plan: Multiple myeloma -05/22/2021-kappa free light chain 11.3, lambda free light chain 17,028 -25 g proteinuria, 20 g lambda light chains -Serum immunofixation 05/22/2021-monoclonal lambda light chains -Bone marrow biopsy 05/23/2021-hypercellular marrow involved by plasma cell myeloma, 80-90% plasma cells on the bone marrow biopsy, diminished iron stains -Cytogenetics-45 X, minus Y, t11:14 (11 q13) -Cycle 1 Velcade/Decadron 05/23/2021 (Decadron given  05/22/2021) -Cycle 1 DRVd 06/13/2021, treatment held 06/20/2021 due to a rash, Revlimid resumed 06/28/2021, daratumumab resumed 07/04/2021 Revlimid resumed 06/28/2021 Cycle 2 daratumumab/Revlimid/Decadron/Velcade 07/18/2021 (Revlimid started 07/19/2021, placed on hold 07/25/2021 secondary to a rash-last taken 07/24/2021) Treatment continued with weekly Velcade/Decadron and daratumumab Treatment changed to pomalidomide, daratumumab, Decadron 10/03/2021 Pomalidomide placed on hold 10/17/2021 due to a rash Pomalidomide resumed at a reduced dose of 2 mg daily for 21 days followed by 7-day break 10/31/2021 Pomalidomide 2 mg daily for 21 days beginning 11/28/2021 Lambda light chains increased 12/12/2021 Venetoclax/Carfilzomib/Decadron 12/26/2021, venetoclax started 01/05/2022   Renal failure secondary to #1-improved Hypercalcemia-status post treat with calcitonin, pamidronate 05/22/2021-resolved Lytic bone lesions Anemia Thrombocytopenia Hypocalcemia-IV calcium gluconate 05/30/2021, resolved Rash 06/20/2021, appears consistent with a drug rash, recurrent rash 07/24/2021-Revlimid placed on hold; recurrent rash 10/17/2021  Disposition: Jordan King is here today for cycle 1 day 15 Carfilzomib.  He began venetoclax 01/05/2022.  Renal function has worsened and uric acid is markedly elevated.  We are concerned for tumor lysis syndrome.  IV fluids have been initiated.  We are recommending hospitalization.  Dr. Benay Spice has spoken with the hospitalist who has accepted him for admission.  He has multiple wounds over the upper extremities from an interaction with a dog.  He may need an antibiotic.  Patient seen with Dr. Benay Spice.  Ned Card ANP/GNP-BC   01/09/2022  1:30 PM This was a shared visit with Ned Card.  Mr King was interviewed and examined.  He presented today for scheduled carfilzomib therapy.  He began venetoclax 01/05/2022.  The creatinine uric acid are significantly elevated.  The  potassium and phosphorus were  normal, but we remain suspicious of tumor lysis syndrome.  Mr. Ayre agrees to hospital admission for monitoring of the serum electrolytes and uric acid.  He began intravenous hydration while at the Cancer center clinic today.  It is also possible the elevated creatinine and uric acid are related to progression of multiple myeloma with renal injury.  I think this is less likely than tumor lysis.  He had significant injury from a dog bite yesterday.  It is possible he has developed a systemic infection related to puncture wounds.  Venetoclax and carfilzomib will be placed on hold.  I will contact Dr. Feliciana Rossetti at [facility] to discuss the case.  I discussed the case with Dr. Cyndia Skeeters and he agrees to admit Jordan King to the medical service.  Recommendations: Admit for intravenous hydration and close monitoring of the creatinine, electrolytes, and uric acid Rasburicase Nephrology consult I will check on him in 01/10/2022.

## 2022-01-09 NOTE — H&P (Signed)
History and Physical   Jordan King. SMO:[id number] DOB: 11-13-1970 DOA: 01/09/2022  PCP: Ayesha Mohair, MD   Patient coming from: Home/oncology office  Chief Complaint: AKI  HPI: Jordan King. is a 51 y.o. male with medical history significant of multiple myeloma, asthma, presenting with AKI from oncology office.  Patient was evaluated at oncology office today due to abnormal labs.  Patient noted to have elevated creatinine and there is concern for possible tumor lysis syndrome versus worsening renal function secondary to his multiple myeloma and recent initiation of new treatment.  He was sent for admission for further treatment and work-up.  He also recently broke up a dog fight and was offered ED evaluation but declined.  He does have wounds on bilateral hands and arms.with some limited mobility in left hand.  He denies fevers, chills, chest pain, shortness breath, abdominal pain, constipation, diarrhea, nausea, vomiting.  ED Course: Vital signs notable for blood pressure in the 120s to 140s today as well as heart rate in the 80s to 110s.  Lab work-up included CMP with sodium 133, bicarb 21, BUN 28, creatinine elevated 2.96 and baseline at 1.14, glucose 125.  CBC with stable leukocytosis to 14.4.  LDH normal.  Uric acid elevated to 17.3.  Urinalysis with hemoglobin and rare bacteria only.  Phosphorus level normal.  Oncology requested direct admission for IV fluids, rasburicase per pharmacy, nephrology evaluation (they will need to be called in the morning).  Review of Systems: As per HPI otherwise all other systems reviewed and are negative.  Past Medical History:  Diagnosis Date   Asthma     No past surgical history on file.  Social History  reports that he has been smoking cigarettes. He has never used smokeless tobacco. He reports that he does not currently use alcohol. He reports that he does not currently use drugs.  Allergies  Allergen Reactions    Hydrocodone-Acetaminophen Shortness Of Breath   Lenalidomide Anaphylaxis, Swelling, Rash and Other (See Comments)    Severe rash and throat started to close   Venetoclax Other (See Comments)    Suspected "tumor lysis syndrome"   Pomalidomide Rash and Other (See Comments)    Sold under the brand names Pomalyst and Imnovid    No family history on file.  Prior to Admission medications   Medication Sig Start Date End Date Taking? Authorizing Provider  albuterol (VENTOLIN HFA) 108 (90 Base) MCG/ACT inhaler Inhale 2 puffs into the lungs every 6 (six) hours as needed for wheezing or shortness of breath. 09/05/21   [name], MD  aspirin EC 81 MG tablet Take 81 mg by mouth daily. Swallow whole.    [name], MD  calcium carbonate (TUMS EX) 750 MG chewable tablet Chew 2 tablets by mouth 3 (three) times daily. With every meal Patient not taking: Reported on 01/02/2022    [provider]  clotrimazole-betamethasone (LOTRISONE) cream Apply 1 Application topically 2 (two) times daily as needed. 09/05/21   [name], MD  cyclobenzaprine (FLEXERIL) 10 MG tablet Take 10 mg by mouth 3 (three) times daily as needed for muscle spasms. Patient not taking: Reported on 07/18/2021    [provider]  dexamethasone (DECADRON) 4 MG tablet Take 5 tablets (20 mg total) by mouth once a week. 12/12/21   Owens Shark, NP  fluconazole (DIFLUCAN) 200 MG tablet Take 1 tablet (200 mg total) by mouth daily. Patient not taking: Reported on 01/02/2022 12/23/21  [name], MD  ipratropium (ATROVENT) 0.06 % nasal spray Place 1 spray into both nostrils 3 (three) times daily. 12/07/21   [provider]  levofloxacin (LEVAQUIN) 500 MG tablet Take 1 tablet (500 mg total) by mouth daily. Patient not taking: Reported on 01/02/2022 12/23/21   [name], MD  loratadine (CLARITIN) 10 MG tablet Take 1 tablet (10 mg total) by mouth daily. Patient not taking: Reported on 08/29/2021  05/28/21   Geradine Girt, DO  LORazepam (ATIVAN) 0.5 MG tablet TAKE 1 TABLET(0.5 MG) BY MOUTH EVERY 8 HOURS AS NEEDED FOR ANXIETY 12/12/21   Owens Shark, NP  oxyCODONE-acetaminophen (PERCOCET/ROXICET) 5-325 MG tablet Take 1 tablet by mouth every 6 (six) hours as needed for severe pain. 12/12/21   Owens Shark, NP  pantoprazole (PROTONIX) 20 MG tablet Take 1 tablet (20 mg total) by mouth daily. 12/12/21   Owens Shark, NP  polyethylene glycol (MIRALAX / GLYCOLAX) 17 g packet Take 17 g by mouth daily as needed for mild constipation. 05/27/21   Geradine Girt, DO  prochlorperazine (COMPAZINE) 10 MG tablet TAKE 1 TABLET(10 MG) BY MOUTH EVERY 6 HOURS AS NEEDED FOR NAUSEA OR VOMITING 12/12/21   Owens Shark, NP  sulfamethoxazole-trimethoprim (BACTRIM DS) 800-160 MG tablet Take 1 tablet by mouth 3 (three) times a week. MWF Patient not taking: Reported on 01/02/2022 12/23/21   Owens Shark, NP  valACYclovir (VALTREX) 500 MG tablet Take 1 tablet (500 mg total) by mouth 2 (two) times daily. 07/19/21   [name], MD  venetoclax (VENCLEXTA) 100 MG tablet Take 2 tablets (200 mg total) by mouth daily. Tablets should be swallowed whole with a meal and a full glass of water. Patient not taking: Reported on 01/02/2022 12/26/21   [name], MD    Physical Exam: Vitals:   01/09/22 1839 01/09/22 1900  BP: (!) 144/91   Pulse: (!) 110   Resp: 18   Temp: 98.4 F (36.9 C)   TempSrc: Oral   SpO2: 94%   Weight:  80.5 kg  Height:  5' 8"  (1.727 m)    Physical Exam Constitutional:      General: He is not in acute distress.    Appearance: Normal appearance.  HENT:     Head: Normocephalic and atraumatic.     Mouth/Throat:     Mouth: Mucous membranes are moist.     Pharynx: Oropharynx is clear.  Eyes:     Extraocular Movements: Extraocular movements intact.     Pupils: Pupils are equal, round, and reactive to light.  Cardiovascular:     Rate and Rhythm: Normal rate and regular rhythm.      Pulses: Normal pulses.     Heart sounds: Normal heart sounds.  Pulmonary:     Effort: Pulmonary effort is normal. No respiratory distress.     Breath sounds: Normal breath sounds.  Abdominal:     General: Bowel sounds are normal. There is no distension.     Palpations: Abdomen is soft.     Tenderness: There is no abdominal tenderness.  Musculoskeletal:        General: No swelling or deformity.     Comments: Decreased mobility at Left 2nd and 3rd digits.  Skin:    General: Skin is warm and dry.     Comments: Wounds on bilat hands and arms.  Neurological:     General: No focal deficit present.     Mental Status: Mental status  is at baseline.    Labs on Admission: I have personally reviewed following labs and imaging studies  CBC: Recent Labs  Lab 01/09/22 0955  WBC 14.4*  NEUTROABS 11.8*  HGB 13.5  HCT 39.2  MCV 95.4  PLT 765    Basic Metabolic Panel: Recent Labs  Lab 01/09/22 0955  NA 133*  K 3.9  CL 99  CO2 20*  GLUCOSE 125*  BUN 28*  CREATININE 2.96*  CALCIUM 9.6  PHOS 4.0    GFR: Estimated Creatinine Clearance: 28.9 mL/min (A) (by C-G formula based on SCr of 2.96 mg/dL (H)).  Liver Function Tests: Recent Labs  Lab 01/09/22 0955  AST 20  ALT 12  ALKPHOS 53  BILITOT 0.5  PROT 6.7  ALBUMIN 4.2    Urine analysis:    Component Value Date/Time   COLORURINE COLORLESS (A) 01/09/2022 1337   APPEARANCEUR CLEAR 01/09/2022 1337   LABSPEC <1.005 (L) 01/09/2022 1337   PHURINE 5.0 01/09/2022 1337   GLUCOSEU NEGATIVE 01/09/2022 1337   HGBUR TRACE (A) 01/09/2022 1337   BILIRUBINUR NEGATIVE 01/09/2022 1337   KETONESUR NEGATIVE 01/09/2022 1337   PROTEINUR NEGATIVE 01/09/2022 1337   NITRITE NEGATIVE 01/09/2022 [place] 01/09/2022 1337    Radiological Exams on Admission: No results found.  EKG: Not performed thus far.  Assessment/Plan Principal Problem:   AKI (acute kidney injury) ([place]) Active Problems:   Multiple myeloma  (HCC)   GERD (gastroesophageal reflux disease)   Asthma, chronic   Anxiety   Dog bite   AKI Multiple myeloma Rule out tumor lysis syndrome > On venetoclax and carfilzomib. > Patient presenting as direct admit from cancer center.  He is noted to have elevated creatinine to 2.96 from baseline 1.14. > In the setting of multiple myeloma with uric acid of 17.3 concern is for possible tumor lysis syndrome versus injury from his multiple myeloma. > Oncology has requested IV fluids, rasburicase, nephrology consult. - Monitor on MedSurg - We will need nephrology consult in the morning - IV fluids overnight - Rasburicase per pharmacy ordered - Trend renal function and electrolytes - Continue as needed oxycodone - Continue home prophylactic Valtrex, Levaquin, Bactrim, fluconazole  Dog bite Hand wounds > Was recently bitten and scratched after breaking up a dog fight between his neighbors dogs and his dog. > Bilateral hand and arm wounds.  Decreased mobility of the left hand. > Is on Bactrim and Levaquin either way as above - We will check x-ray of left hand given mildly decreased mobility  Asthma - Continue home albuterol  GERD - Continue PPI  Anxiety - Continue as needed Ativan  DVT prophylaxis: Lovenox Code Status:   Full Family Communication:  Updated at bedside Disposition Plan:   Patient is from:  Home  Anticipated DC to:  Home  Anticipated DC date:  1 to 3 days  Anticipated DC barriers: None  Consults called:  Oncology requested direct admission, they will see in the morning.  Will need a nephrology consult in the morning. Admission status:  Observation, MedSurg  Severity of Illness: The appropriate patient status for this patient is OBSERVATION. Observation status is judged to be reasonable and necessary in order to provide the required intensity of service to ensure the patient's safety. The patient's presenting symptoms, physical exam findings, and initial radiographic  and laboratory data in the context of their medical condition is felt to place them at decreased risk for further clinical deterioration. Furthermore, it is anticipated that the patient  will be medically stable for discharge from the hospital within 2 midnights of admission.    Marcelyn Bruins MD [facility]  How to contact the [facility] Attending or Consulting provider [place] or covering provider during after hours [place], for this patient?   Check the care team in [facility] and look for a) attending/consulting TR[facility] provider listed and b) the [facility] team listed Log into [email or web address] and use [place]'s universal password to access. If you do not have the password, please contact the hospital operator. Locate the [facility] provider you are looking for under [facility] and page to a number that you can be directly reached. If you still have difficulty reaching the provider, please page the [facility] (Director on Call) for the Hospitalists listed on amion for assistance.  01/09/2022, 11:04 PM

## 2022-01-10 ENCOUNTER — Other Ambulatory Visit: Payer: Self-pay

## 2022-01-10 ENCOUNTER — Encounter (HOSPITAL_COMMUNITY): Payer: Self-pay | Admitting: Family Medicine

## 2022-01-10 ENCOUNTER — Observation Stay (HOSPITAL_COMMUNITY): Payer: BLUE CROSS/BLUE SHIELD

## 2022-01-10 ENCOUNTER — Other Ambulatory Visit (HOSPITAL_COMMUNITY): Payer: Self-pay

## 2022-01-10 DIAGNOSIS — E871 Hypo-osmolality and hyponatremia: Secondary | ICD-10-CM

## 2022-01-10 DIAGNOSIS — Z7982 Long term (current) use of aspirin: Secondary | ICD-10-CM | POA: Diagnosis not present

## 2022-01-10 DIAGNOSIS — S51812A Laceration without foreign body of left forearm, initial encounter: Secondary | ICD-10-CM | POA: Diagnosis present

## 2022-01-10 DIAGNOSIS — D649 Anemia, unspecified: Secondary | ICD-10-CM | POA: Diagnosis present

## 2022-01-10 DIAGNOSIS — Z79899 Other long term (current) drug therapy: Secondary | ICD-10-CM | POA: Diagnosis not present

## 2022-01-10 DIAGNOSIS — C9000 Multiple myeloma not having achieved remission: Secondary | ICD-10-CM | POA: Diagnosis present

## 2022-01-10 DIAGNOSIS — E883 Tumor lysis syndrome: Secondary | ICD-10-CM

## 2022-01-10 DIAGNOSIS — T380X5A Adverse effect of glucocorticoids and synthetic analogues, initial encounter: Secondary | ICD-10-CM | POA: Diagnosis present

## 2022-01-10 DIAGNOSIS — F419 Anxiety disorder, unspecified: Secondary | ICD-10-CM | POA: Diagnosis present

## 2022-01-10 DIAGNOSIS — T451X5A Adverse effect of antineoplastic and immunosuppressive drugs, initial encounter: Secondary | ICD-10-CM | POA: Diagnosis present

## 2022-01-10 DIAGNOSIS — R21 Rash and other nonspecific skin eruption: Secondary | ICD-10-CM | POA: Diagnosis present

## 2022-01-10 DIAGNOSIS — F17210 Nicotine dependence, cigarettes, uncomplicated: Secondary | ICD-10-CM | POA: Diagnosis present

## 2022-01-10 DIAGNOSIS — J45909 Unspecified asthma, uncomplicated: Secondary | ICD-10-CM | POA: Diagnosis present

## 2022-01-10 DIAGNOSIS — E790 Hyperuricemia without signs of inflammatory arthritis and tophaceous disease: Secondary | ICD-10-CM | POA: Diagnosis present

## 2022-01-10 DIAGNOSIS — Z888 Allergy status to other drugs, medicaments and biological substances status: Secondary | ICD-10-CM | POA: Diagnosis not present

## 2022-01-10 DIAGNOSIS — Z7969 Long term (current) use of other immunomodulators and immunosuppressants: Secondary | ICD-10-CM | POA: Diagnosis not present

## 2022-01-10 DIAGNOSIS — Z885 Allergy status to narcotic agent status: Secondary | ICD-10-CM | POA: Diagnosis not present

## 2022-01-10 DIAGNOSIS — K219 Gastro-esophageal reflux disease without esophagitis: Secondary | ICD-10-CM | POA: Diagnosis present

## 2022-01-10 DIAGNOSIS — N179 Acute kidney failure, unspecified: Secondary | ICD-10-CM | POA: Diagnosis present

## 2022-01-10 DIAGNOSIS — W540XXA Bitten by dog, initial encounter: Secondary | ICD-10-CM | POA: Diagnosis not present

## 2022-01-10 DIAGNOSIS — D696 Thrombocytopenia, unspecified: Secondary | ICD-10-CM | POA: Diagnosis present

## 2022-01-10 LAB — COMPREHENSIVE METABOLIC PANEL
ALT: 17 U/L (ref 0–44)
AST: 23 U/L (ref 15–41)
Albumin: 3.5 g/dL (ref 3.5–5.0)
Alkaline Phosphatase: 48 U/L (ref 38–126)
Anion gap: 9 (ref 5–15)
BUN: 37 mg/dL — ABNORMAL HIGH (ref 6–20)
CO2: 19 mmol/L — ABNORMAL LOW (ref 22–32)
Calcium: 8.1 mg/dL — ABNORMAL LOW (ref 8.9–10.3)
Chloride: 107 mmol/L (ref 98–111)
Creatinine, Ser: 3.45 mg/dL — ABNORMAL HIGH (ref 0.61–1.24)
GFR, Estimated: 21 mL/min — ABNORMAL LOW (ref >60)
Glucose, Bld: 146 mg/dL — ABNORMAL HIGH (ref 70–99)
Potassium: 4.3 mmol/L (ref 3.5–5.1)
Sodium: 135 mmol/L (ref 135–145)
Total Bilirubin: 0.5 mg/dL (ref 0.3–1.2)
Total Protein: 6.4 g/dL — ABNORMAL LOW (ref 6.5–8.1)

## 2022-01-10 LAB — CBC
HCT: 35.2 % — ABNORMAL LOW (ref 39.0–52.0)
Hemoglobin: 11.9 g/dL — ABNORMAL LOW (ref 13.0–17.0)
MCH: 33.7 pg (ref 26.0–34.0)
MCHC: 33.8 g/dL (ref 30.0–36.0)
MCV: 99.7 fL (ref 80.0–100.0)
Platelets: 164 10*3/uL (ref 150–400)
RBC: 3.53 MIL/uL — ABNORMAL LOW (ref 4.22–5.81)
RDW: 16.5 % — ABNORMAL HIGH (ref 11.5–15.5)
WBC: 13.9 10*3/uL — ABNORMAL HIGH (ref 4.0–10.5)
nRBC: 0 % (ref 0.0–0.2)

## 2022-01-10 LAB — RASBURICASE - URIC ACID: Uric Acid, Serum: 2.7 mg/dL — ABNORMAL LOW (ref 3.7–8.6)

## 2022-01-10 LAB — PHOSPHORUS: Phosphorus: 3.8 mg/dL (ref 2.5–4.6)

## 2022-01-10 MED ORDER — AMOXICILLIN-POT CLAVULANATE 500-125 MG PO TABS
500.0000 mg | ORAL_TABLET | Freq: Two times a day (BID) | ORAL | Status: DC
  Administered 2022-01-10 – 2022-01-12 (×4): 1 via ORAL
  Filled 2022-01-10 (×4): qty 1

## 2022-01-10 MED ORDER — LEVOFLOXACIN 500 MG PO TABS
500.0000 mg | ORAL_TABLET | ORAL | Status: DC
  Filled 2022-01-10: qty 1

## 2022-01-10 NOTE — Assessment & Plan Note (Signed)
-   Continue Ativan 

## 2022-01-10 NOTE — Hospital Course (Addendum)
Jordan King is a 51 y.o. M with Multiple Myeloma, asthma and smoking who presented with abnormal labs.  Patient seen in Oncology clinic for routine appointment for Carfilzomib and was noted to have Cr up to 2.96   10/23: Direct admitted, Cr 2.9 10/24: Cr up to 3.4, Nephrlogy consulted

## 2022-01-10 NOTE — Assessment & Plan Note (Signed)
Uric acid 17 on admission. Given rasburicase per pharmacy protocol, urate down to undetectable levels.

## 2022-01-10 NOTE — Assessment & Plan Note (Signed)
Mild. Resolved with IV fluids.

## 2022-01-10 NOTE — Progress Notes (Signed)
Mobility Specialist - Progress Note  Pt has refused mobility twice today, first was due to breakfast and not feeling up for it at the moment, second was due to family members being present. Pt did agree to ambulate the following morning, will check back in then.  Roderick Pee Mobility Specialist

## 2022-01-10 NOTE — Assessment & Plan Note (Signed)
Stable. -Continue Albuterol

## 2022-01-10 NOTE — Assessment & Plan Note (Addendum)
In setting of Venetoclax, TLS.  Creatinine 2.96 on admission, up to 3.4 today with fluids. - Continue fluids - Hold bactrim - Avoid nephrotoxins and hypotension - Consult Nephrology

## 2022-01-10 NOTE — Progress Notes (Signed)
IP PROGRESS NOTE  Subjective:   No complaint.  Objective: Vital signs in last 24 hours: Blood pressure 126/70, pulse 87, temperature 97.7 F (36.5 C), temperature source Oral, resp. rate 18, height 5' 8"  (1.727 m), weight 177 lb 7.5 oz (80.5 kg), SpO2 97 %.  Intake/Output from previous day: 10/23 0701 - 10/24 0700 In: 1000 [I.V.:1000] Out: 800 [Urine:800]  Physical Exam:  HEENT: No thrush Lungs: Bilateral end inspiratory and expiratory wheeze/rhonchi, no respiratory distress Cardiac: Regular rate and rhythm Abdomen: No hepatosplenomegaly Extremities: No leg edema Skin: Mild confluent erythema over the face and trunk   Lab Results: Recent Labs    01/09/22 0955  WBC 14.4*  HGB 13.5  HCT 39.2  PLT 160    BMET Recent Labs    01/09/22 0955  NA 133*  K 3.9  CL 99  CO2 20*  GLUCOSE 125*  BUN 28*  CREATININE 2.96*  CALCIUM 9.6    No results found for: "CEA1", "CEA", "STM196", "CA125"  Studies/Results: No results found.  Medications: I have reviewed the patient's current medications.  Assessment/Plan: Multiple myeloma -05/22/2021-kappa free light chain 11.3, lambda free light chain 17,028 -25 g proteinuria, 20 g lambda light chains -Serum immunofixation 05/22/2021-monoclonal lambda light chains -Bone marrow biopsy 05/23/2021-hypercellular marrow involved by plasma cell myeloma, 80-90% plasma cells on the bone marrow biopsy, diminished iron stains -Cytogenetics-45 X, minus Y, t11:14 (11 q13) -Cycle 1 Velcade/Decadron 05/23/2021 (Decadron given 05/22/2021) -Cycle 1 DRVd 06/13/2021, treatment held 06/20/2021 due to a rash, Revlimid resumed 06/28/2021, daratumumab resumed 07/04/2021 Revlimid resumed 06/28/2021 Cycle 2 daratumumab/Revlimid/Decadron/Velcade 07/18/2021 (Revlimid started 07/19/2021, placed on hold 07/25/2021 secondary to a rash-last taken 07/24/2021) Treatment continued with weekly Velcade/Decadron and daratumumab Treatment changed to pomalidomide, daratumumab, Decadron  10/03/2021 Pomalidomide placed on hold 10/17/2021 due to a rash Pomalidomide resumed at a reduced dose of 2 mg daily for 21 days followed by 7-day break 10/31/2021 Pomalidomide 2 mg daily for 21 days beginning 11/28/2021 Lambda light chains increased 12/12/2021 Venetoclax/Carfilzomib/Decadron 12/26/2021, venetoclax started 01/05/2022   Renal failure secondary to #1-improved Hypercalcemia-status post treat with calcitonin, pamidronate 05/22/2021-resolved Lytic bone lesions Anemia Thrombocytopenia Hypocalcemia-IV calcium gluconate 05/30/2021, resolved Rash 06/20/2021,  consistent with a drug rash, recurrent rash 07/24/2021-Revlimid placed on hold; recurrent rash 10/17/2021 Admission 01/09/2022 with renal failure, elevated uric acid, rasburicase 01/09/2022 Wheezing/cough-likely secondary to COPD and asthma   Jordan King appears stable.  He is admitted yesterday with a new onset rise in the creatinine and elevated uric acid.  He began venetoclax on 01/05/2022.  We are concerned he has developed tumor lysis syndrome from the venetoclax.  He received rasburicase last night.  He is maintained on antibiotic prophylaxis due to immunosuppression from the systemic therapy regimen.  The chemistry panel and uric acid are pending from this morning.  I discussed the case with Dr. Feliciana Rossetti from [facility] last night.  He recommends sending venetoclax at a reduced dose when the creatinine returns to baseline.  He has mild erythema over the face and trunk, likely related to Decadron he took yesterday.  Recommendations: 1.  Continue intravenous hydration 2.  Follow-up creatinine and uric acid this morning 3.  Continue to hold venetoclax 4.  Nephrology consult if the creatinine remains elevated   LOS: 0 days   Betsy Coder, MD   01/10/2022, 7:13 AM

## 2022-01-10 NOTE — Consult Note (Signed)
[facility] Renal Consultation Note  Requesting MD: Myrene Buddy, MD Indication for Consultation:  AKI  Chief complaint: presented from clinic with AKI  HPI:  Jordan King. is a 51 y.o. male with a history of asthma and multiple myeloma who presented to the hospital from the oncology office with AKI and concern for possible tumor lysis syndrome.  Creatinine trends as below.  Baseline creatinine 1.1 as recently as 01/02/2022.  Note that he did have AKI with a peak creatinine of 4.14 in March of this year as well.  His uric acid had been markedly elevated at 17.3 and he was given rasburicase and fluids with improvement to uric acid of 2.7.  Oncology is concerned that he has developed tumor lysis syndrome from the venetoclax as this was recently reinitiated.  Note that he was recently initiated on bactrim ppx.  Oncology has contacted hem/onc at [facility] as the patient has had adverse reactions to multiple prior agents - they are recommending to hold venetoclax for now and then to resume at a lower dose.  Note that he has an injury to his arm as he recently broke up a dog fight this weekend.  Dog is still needing to get to the vet and he almost didn't agree to come to the hospital 2/2 the same.  Short of breath today but states that is typical for him with his chemo schedule.  He states he has had a lot of urine output.  He has been on NS at 125 ml/hr.  Strict ins/outs not available.  Had 800 ml UOP charted over 10/23.   Creatinine  Date/Time Value Ref Range Status  01/09/2022 09:55 AM 2.96 (H) 0.61 - 1.24 mg/dL Final  01/02/2022 10:36 AM 1.14 0.61 - 1.24 mg/dL Final  12/26/2021 10:04 AM 1.15 0.61 - 1.24 mg/dL Final  12/12/2021 10:49 AM 1.08 0.61 - 1.24 mg/dL Final  11/14/2021 12:52 PM 1.19 0.61 - 1.24 mg/dL Final  10/31/2021 08:27 AM 1.19 0.61 - 1.24 mg/dL Final  10/17/2021 11:18 AM 1.11 0.61 - 1.24 mg/dL Final  09/19/2021 11:31 AM 1.14 0.61 - 1.24 mg/dL Final   09/12/2021 10:50 AM 1.29 (H) 0.61 - 1.24 mg/dL Final  09/05/2021 11:05 AM 1.28 (H) 0.61 - 1.24 mg/dL Final  08/29/2021 08:07 AM 1.34 (H) 0.61 - 1.24 mg/dL Final  08/22/2021 12:13 PM 1.12 0.61 - 1.24 mg/dL Final  08/16/2021 11:03 AM 1.30 (H) 0.61 - 1.24 mg/dL Final  08/08/2021 10:53 AM 1.27 (H) 0.61 - 1.24 mg/dL Final  07/18/2021 09:33 AM 1.37 (H) 0.61 - 1.24 mg/dL Final  07/11/2021 11:22 AM 1.34 (H) 0.61 - 1.24 mg/dL Final  07/04/2021 10:54 AM 1.35 (H) 0.61 - 1.24 mg/dL Final  06/28/2021 11:23 AM 1.56 (H) 0.61 - 1.24 mg/dL Final  06/20/2021 11:25 AM 1.96 (H) 0.61 - 1.24 mg/dL Final  06/13/2021 10:19 AM 2.03 (H) 0.61 - 1.24 mg/dL Final  06/02/2021 09:09 AM 1.65 (H) 0.61 - 1.24 mg/dL Final  05/30/2021 09:39 AM 1.73 (H) 0.61 - 1.24 mg/dL Final   Creatinine, Ser  Date/Time Value Ref Range Status  01/10/2022 08:28 AM 3.45 (H) 0.61 - 1.24 mg/dL Final  05/27/2021 12:41 PM 2.04 (H) 0.61 - 1.24 mg/dL Final  05/27/2021 07:17 AM 2.11 (H) 0.61 - 1.24 mg/dL Final  05/27/2021 07:17 AM 2.13 (H) 0.61 - 1.24 mg/dL Final  05/26/2021 06:08 AM 2.36 (H) 0.61 - 1.24 mg/dL Final  05/26/2021 06:08 AM 2.31 (H) 0.61 - 1.24 mg/dL Final  05/25/2021 03:44  AM 2.56 (H) 0.61 - 1.24 mg/dL Final  05/24/2021 04:34 AM 3.00 (H) 0.61 - 1.24 mg/dL Final  05/23/2021 02:06 AM 3.94 (H) 0.61 - 1.24 mg/dL Final  05/22/2021 02:15 AM 3.92 (H) 0.61 - 1.24 mg/dL Final  05/21/2021 06:36 PM 4.14 (H) 0.61 - 1.24 mg/dL Final     PMHx:   Past Medical History:  Diagnosis Date   Asthma     No past surgical history on file.  Family Hx: he denies any family hx of CKD or ESRD   Social History:  reports that he has been smoking cigarettes. He has never used smokeless tobacco. He reports that he does not currently use alcohol. He reports that he does not currently use drugs.  Allergies:  Allergies  Allergen Reactions   Hydrocodone-Acetaminophen Shortness Of Breath   Lenalidomide Anaphylaxis, Swelling, Rash and Other (See  Comments)    Severe rash and throat started to close   Venetoclax Other (See Comments)    Suspected "tumor lysis syndrome"   Pomalidomide Rash and Other (See Comments)    Sold under the brand names Pomalyst and Imnovid    Medications: Prior to Admission medications   Medication Sig Start Date End Date Taking? Authorizing Provider  albuterol (VENTOLIN HFA) 108 (90 Base) MCG/ACT inhaler Inhale 2 puffs into the lungs every 6 (six) hours as needed for wheezing or shortness of breath. Patient taking differently: Inhale 2 puffs into the lungs See admin instructions. Inhale 2 puffs into the lungs in the morning and at bedtime- and an additional 2 puffs two times a day as needed for shortness of breath 09/05/21  Yes [name], MD  aspirin EC 81 MG tablet Take 81 mg by mouth at bedtime. Swallow whole.   Yes [name], MD  calcium carbonate (TUMS EX) 750 MG chewable tablet Chew 2 tablets by mouth 3 (three) times daily as needed for heartburn.   Yes [provider]  cyclobenzaprine (FLEXERIL) 10 MG tablet Take 10 mg by mouth 3 (three) times daily as needed for muscle spasms.   Yes [provider]  dexamethasone (DECADRON) 4 MG tablet Take 5 tablets (20 mg total) by mouth once a week. Patient taking differently: Take 20 mg by mouth every Monday. 12/12/21  Yes Owens Shark, NP  fluconazole (DIFLUCAN) 200 MG tablet Take 1 tablet (200 mg total) by mouth daily. Patient taking differently: Take 200 mg by mouth at bedtime. 12/23/21  Yes [name], MD  ipratropium (ATROVENT) 0.06 % nasal spray Place 1 spray into both nostrils See admin instructions. Instill  spray into both nostrils one to three times a day 12/07/21  Yes [provider]  levofloxacin (LEVAQUIN) 500 MG tablet Take 1 tablet (500 mg total) by mouth daily. Patient taking differently: Take 500 mg by mouth daily after supper. 12/23/21  Yes [name], MD  LORazepam (ATIVAN) 0.5 MG tablet TAKE 1  TABLET(0.5 MG) BY MOUTH EVERY 8 HOURS AS NEEDED FOR ANXIETY Patient taking differently: Take 0.5 mg by mouth See admin instructions. Take 0.5 mg by mouth at bedtime and an additional 0.5 mg up to 2 times a day as needed for anxiety 12/12/21  Yes Owens Shark, NP  Nicotine (NICODERM CQ TD) Place 1 patch onto the skin daily as needed (for smoking cessation while hospitalized).   Yes [provider]  oxyCODONE-acetaminophen (PERCOCET/ROXICET) 5-325 MG tablet Take 1 tablet by mouth every 6 (six) hours as needed for severe pain. 12/12/21  Yes Owens Shark, NP  pantoprazole (PROTONIX) 20 MG tablet Take 1 tablet (20 mg total) by mouth daily. Patient taking differently: Take 20 mg by mouth daily before breakfast. 12/12/21  Yes Owens Shark, NP  polyethylene glycol (MIRALAX / GLYCOLAX) 17 g packet Take 17 g by mouth daily as needed for mild constipation. Patient taking differently: Take 17 g by mouth at bedtime as needed for mild constipation. 05/27/21  Yes Vann, Jessica U, DO  prochlorperazine (COMPAZINE) 10 MG tablet TAKE 1 TABLET(10 MG) BY MOUTH EVERY 6 HOURS AS NEEDED FOR NAUSEA OR VOMITING Patient taking differently: Take 10 mg by mouth every 6 (six) hours as needed for nausea or vomiting. 12/12/21  Yes Owens Shark, NP  sulfamethoxazole-trimethoprim (BACTRIM DS) 800-160 MG tablet Take 1 tablet by mouth 3 (three) times a week. MWF Patient taking differently: Take 1 tablet by mouth every Monday, Wednesday, and Friday. 12/23/21  Yes Owens Shark, NP  valACYclovir (VALTREX) 500 MG tablet Take 1 tablet (500 mg total) by mouth 2 (two) times daily. Patient taking differently: Take 500 mg by mouth in the morning and at bedtime. 07/19/21  Yes [name], MD  clotrimazole-betamethasone (LOTRISONE) cream Apply 1 Application topically 2 (two) times daily as needed. Patient not taking: Reported on 01/09/2022 09/05/21   [name], MD  loratadine (CLARITIN) 10 MG tablet Take 10 mg by mouth  daily. Patient not taking: Reported on 01/09/2022    [provider]  venetoclax (VENCLEXTA) 100 MG tablet Take 2 tablets (200 mg total) by mouth daily. Tablets should be swallowed whole with a meal and a full glass of water. Patient not taking: Reported on 01/09/2022 12/26/21   [name], MD    I have reviewed the patient's current and reported prior to admission medications.  Labs:     Latest Ref Rng & Units 01/10/2022    8:28 AM 01/09/2022    9:55 AM 01/02/2022   10:36 AM  BMP  Glucose 70 - 99 mg/dL 146  125  98   BUN 6 - 20 mg/dL 37  28  25   Creatinine 0.61 - 1.24 mg/dL 3.45  2.96  1.14   Sodium 135 - 145 mmol/L 135  133  136   Potassium 3.5 - 5.1 mmol/L 4.3  3.9  3.4   Chloride 98 - 111 mmol/L 107  99  99   CO2 22 - 32 mmol/L 19  20  24    Calcium 8.9 - 10.3 mg/dL 8.1  9.6  9.6     Urinalysis    Component Value Date/Time   COLORURINE COLORLESS (A) 01/09/2022 1337   APPEARANCEUR CLEAR 01/09/2022 1337   LABSPEC <1.005 (L) 01/09/2022 1337   PHURINE 5.0 01/09/2022 1337   GLUCOSEU NEGATIVE 01/09/2022 1337   HGBUR TRACE (A) 01/09/2022 1337   BILIRUBINUR NEGATIVE 01/09/2022 1337   KETONESUR NEGATIVE 01/09/2022 1337   PROTEINUR NEGATIVE 01/09/2022 1337   NITRITE NEGATIVE 01/09/2022 1337   LEUKOCYTESUR NEGATIVE 01/09/2022 1337     ROS:  Pertinent items noted in HPI and remainder of comprehensive ROS otherwise negative.  Physical Exam: Vitals:   01/10/22 0549 01/10/22 1343  BP: 126/70 133/68  Pulse: 87 89  Resp: 18 16  Temp: 97.7 F (36.5 C) 98.3 F (36.8 C)  SpO2: 97% 99%     General:  adult male in bed in NAD  HEENT: NCAT Eyes: EOMI sclera anicteric Neck: supple trachea midline  Heart: S1S2 no rub Lungs:  clear and unlabored on room air  Abdomen: soft/nt/nd Extremities: no edema appreciated; no cyanosis or clubbing Psych: normal mood and affect Skin: bruising on elbows with puncture sites; left hand is wrapped with gauze Neuro: alert and  oriented x 3 provides hx and follows commands   Assessment/Plan:  # AKI  - with hyperuricemia concerning for tumor lysis syndrome.  Hx of AKI in 05/2021 secondary to multiple myeloma  - continue fluids and is s/p rasburicase  - note he was recently initiated on bactrim - may need to consider stopping if Cr continues to rise  # Hyperuricemia  - s/p rasburicase  - fluids  - consider allopurinol per oncology   # Multiple myeloma  - agents per oncology - venetoclax on hold; difficult situation with prior intolerances   # Anemia - normocytic  - setting of multiple myeloma; mild   # Dog bite  - wound care and abx per discretion of primary team   Disposition - continue inpatient monitoring   Claudia Desanctis 01/10/2022, 5:01 PM

## 2022-01-10 NOTE — Consult Note (Signed)
WOC Nurse Consult Note: Reason for Consult:puncture wounds to bilateral forearms and hands sustained while breaking up a dog fight recently. Wound type:trauma Pressure Injury POA: N/A Measurement:See photographs Wound JIR:CVELFYB over puncture wounds with dried serum and surrounding erythema Drainage (amount, consistency, odor) none Periwound:As noted above Dressing procedure/placement/frequency:Topical wound care guidance is requested from admitting provider. I have provided Nursing with guidance to wash the bilateral forearms daily with soap and water, rinse and dry. This is to be followed by painting the lesions with povidone-iodine and allowing them to air-dry. When dry, the lesions will be covered with dry dressings and secured. A silicone foam is to be placed to the patient's sacrum for PI prevention.  [place] nursing team will not follow, but will remain available to this patient, the nursing and medical teams.  Please re-consult if needed.  Thank you for inviting [place] to participate in this patient's Plan of Care.  Maudie Flakes, MSN, RN, CNS, [place], Serita Grammes, [employer], [employer] phone:  [phone]

## 2022-01-10 NOTE — TOC Progression Note (Signed)
Transition of Care (TOC) - Progression Note    Patient Details  Name: Jordan King. MRN: [id number] Date of Birth: 09/17/1970  Transition of Care [facility]) CM/SW Contact  Angelita Ingles, RN Phone Number:[phone]  01/10/2022, 2:27 PM  Clinical Narrative:     Transition of Care [facility]) Screening Note   Patient Details  Name: Jordan King. Date of Birth: 1971-01-29   Transition of Care [facility]) CM/SW Contact:    Angelita Ingles, RN Phone Number: 01/10/2022, 2:27 PM    Transition of Care [facility]) has reviewed patient and no TOC needs have been identified at this time. We will continue to monitor patient advancement through interdisciplinary progression rounds. If new patient transition needs arise, please place a TOC consult.          Expected Discharge Plan and Services                                                 Social Determinants of Health (SDOH) Interventions    Readmission Risk Interventions    05/27/2021    2:40 PM  Readmission Risk Prevention Plan  Post Dischage Appt Complete  Medication Screening Complete  Transportation Screening Complete

## 2022-01-10 NOTE — Assessment & Plan Note (Signed)
Continue Protonix °

## 2022-01-10 NOTE — Assessment & Plan Note (Addendum)
On carfilzomib/venetoclax/decadron as an outpatient. Oncology consulted. Venetoclax held. Levaquin and Bactrim held. -Continue Valtrex and posaconazole for prophylaxis

## 2022-01-10 NOTE — Assessment & Plan Note (Addendum)
Continue Augmentin

## 2022-01-10 NOTE — Progress Notes (Signed)
  Progress Note   Patient: Jordan King. DCV:[id number] DOB: 31-Aug-1970 DOA: 01/09/2022     0 DOS: the patient was seen and examined on 01/10/2022 at 2:58PM      Brief hospital course: Jordan King is a 51 y.o. M with Multiple Myeloma, asthma and smoking who presented with abnormal labs.  Patient seen in Oncology clinic for routine appointment for Carfilzomib and was noted to have Cr up to 2.96   10/23: Direct admitted, Cr 2.9 10/24: Cr up to 3.4, Nephrlogy consulted     Assessment and Plan: * AKI (acute kidney injury) ([place]) In setting of Venetoclax, TLS.  Creatinine 2.96 on admission, up to 3.4 today with fluids. - Continue fluids - Hold bactrim - Avoid nephrotoxins and hypotension - Consult Nephrology - Strict I/Os  Tumor lysis syndrome Uric acid 17 on admission, given rasburicase per pharmacy protocol, urate down to 2.7 today -Consult oncology   Multiple myeloma ([place]) On carfilzomib and venetoclax. - Consult Oncology, appreciate cares -Continue Valtrex and posaconazole for prophylaxis - Hold Levaquin and Bactrim in the setting of Augmentin use  Hyponatremia Mild, asymptomatic - Continue IV fluids  Dog bite Broke up a dog fight prior to admission.  On exam no significant cellulitis - Continue Augmentin for 5 days  Anxiety - Continue home ativan  Asthma, chronic No evidence of flare - Continue albuterol PRN  GERD (gastroesophageal reflux disease) - Continue pantoprazole          Subjective: Patient has no swelling, no dyspnea, no confusion.  He has no new swelling in his arms or redness around the dog bites.  No confusion, vomiting.     Physical Exam: BP 133/68 (BP Location: Right Arm)   Pulse 89   Temp 98.3 F (36.8 C) (Oral)   Resp 16   Ht $R'5\' 8"'iS$  (1.727 m)   Wt 80.5 kg   SpO2 99%   BMI 26.98 kg/m   Thin adult male, sitting in bed, no acute distress RRR, no murmurs, no peripheral edema Respiratory rate normal, lungs clear without  rales or wheezes Expected arms are unwrapped, and have numerous scratches since shallow lacerations which are hemostatic, without surrounding cellulitis Abdomen soft without tenderness palpation or guarding, no ascites or distention Attention normal, affect normal, judgment insight appear normal  Data Reviewed: Discussed with nephrology and hematology Basic metabolic panel shows normalized hyponatremia Worsening creatinine Uric acid improved Hemoglobin 13, no change    Family Communication: None present    Disposition: Status is: Inpatient The patient presented with AKI in the setting of venetoclax and suspected tumor lysis syndrome  Renal function worsening, nephrology consulted        Author: Edwin Dada, MD 01/10/2022 8:06 PM  For on call review [email or web address].

## 2022-01-11 DIAGNOSIS — J45909 Unspecified asthma, uncomplicated: Secondary | ICD-10-CM | POA: Diagnosis not present

## 2022-01-11 DIAGNOSIS — F419 Anxiety disorder, unspecified: Secondary | ICD-10-CM | POA: Diagnosis not present

## 2022-01-11 DIAGNOSIS — W540XXA Bitten by dog, initial encounter: Secondary | ICD-10-CM | POA: Diagnosis not present

## 2022-01-11 DIAGNOSIS — N179 Acute kidney failure, unspecified: Secondary | ICD-10-CM | POA: Diagnosis not present

## 2022-01-11 DIAGNOSIS — E883 Tumor lysis syndrome: Secondary | ICD-10-CM

## 2022-01-11 DIAGNOSIS — E871 Hypo-osmolality and hyponatremia: Secondary | ICD-10-CM

## 2022-01-11 LAB — COMPREHENSIVE METABOLIC PANEL
ALT: 17 U/L (ref 0–44)
AST: 27 U/L (ref 15–41)
Albumin: 3.3 g/dL — ABNORMAL LOW (ref 3.5–5.0)
Alkaline Phosphatase: 43 U/L (ref 38–126)
Anion gap: 10 (ref 5–15)
BUN: 34 mg/dL — ABNORMAL HIGH (ref 6–20)
CO2: 18 mmol/L — ABNORMAL LOW (ref 22–32)
Calcium: 7.8 mg/dL — ABNORMAL LOW (ref 8.9–10.3)
Chloride: 111 mmol/L (ref 98–111)
Creatinine, Ser: 2.02 mg/dL — ABNORMAL HIGH (ref 0.61–1.24)
GFR, Estimated: 39 mL/min — ABNORMAL LOW (ref >60)
Glucose, Bld: 121 mg/dL — ABNORMAL HIGH (ref 70–99)
Potassium: 4 mmol/L (ref 3.5–5.1)
Sodium: 139 mmol/L (ref 135–145)
Total Bilirubin: 0.3 mg/dL (ref 0.3–1.2)
Total Protein: 5.9 g/dL — ABNORMAL LOW (ref 6.5–8.1)

## 2022-01-11 LAB — LACTATE DEHYDROGENASE: LDH: 173 U/L (ref 98–192)

## 2022-01-11 LAB — CBC
HCT: 33.4 % — ABNORMAL LOW (ref 39.0–52.0)
Hemoglobin: 11.3 g/dL — ABNORMAL LOW (ref 13.0–17.0)
MCH: 34.1 pg — ABNORMAL HIGH (ref 26.0–34.0)
MCHC: 33.8 g/dL (ref 30.0–36.0)
MCV: 100.9 fL — ABNORMAL HIGH (ref 80.0–100.0)
Platelets: 163 10*3/uL (ref 150–400)
RBC: 3.31 MIL/uL — ABNORMAL LOW (ref 4.22–5.81)
RDW: 16.9 % — ABNORMAL HIGH (ref 11.5–15.5)
WBC: 14.6 10*3/uL — ABNORMAL HIGH (ref 4.0–10.5)
nRBC: 0 % (ref 0.0–0.2)

## 2022-01-11 LAB — URIC ACID: Uric Acid, Serum: <1.5 mg/dL — ABNORMAL LOW (ref 3.7–8.6)

## 2022-01-11 MED ORDER — ENOXAPARIN SODIUM 40 MG/0.4ML IJ SOSY
40.0000 mg | PREFILLED_SYRINGE | INTRAMUSCULAR | Status: DC
  Filled 2022-01-11: qty 0.4

## 2022-01-11 NOTE — Progress Notes (Signed)
PROGRESS NOTE    Jordan King.  YDX:[id number] DOB: 04/15/1970 DOA: 01/09/2022 PCP: Ayesha Mohair, MD   Brief Narrative: Jordan King. is a 51 y.o. male with a history of multiple myeloma, asthma and smoking. Patient presented secondary to elevated creatinine with evidence of AKI likely secondary to Venetoclax. Venetoclax discontinued and IV fluids initiated. Associated tumor lysis syndrome, treated with rasburicase. Nephrology and hematology/oncology consulted. AKI improving.   Assessment and Plan: * AKI (acute kidney injury) ([place]) Baseline creatinine of 1.2. Creatinine of 3.45 on admission. In setting of Venetoclax and tumor lysis syndrome. Ventetoclax and Bactrim held. Creatinine down to 2.02 today -Nephrology recommendations: continue IV fluids  Tumor lysis syndrome Uric acid 17 on admission. Given rasburicase per pharmacy protocol, urate down to undetectable levels today.   Multiple myeloma ([place]) On carfilzomib/venetoclax/decadron as an outpatient. Oncology consulted. Venetoclax held. Levaquin and Bactrim held. -Continue Valtrex and posaconazole for prophylaxis  Hyponatremia Mild. Resolved with IV fluids.  Dog bite -Continue Augmentin  Anxiety -Continue Ativan  Asthma, chronic Stable. -Continue Albuterol  GERD (gastroesophageal reflux disease) -Continue Protonix    DVT prophylaxis: Lovenox Code Status:   Code Status: Full Code Family Communication: Mother at bedside Disposition Plan: Discharge home likely in 24 hours pending continued improvement in AKI   Consultants:  Nephrology Medical oncology  Procedures:  None  Antimicrobials: Augmentin Valcyclovir Diflucan    Subjective: Patient reports no issues this morning. Feeling well.  Objective: BP 137/75 (BP Location: Left Arm)   Pulse 70   Temp 97.7 F (36.5 C) (Oral)   Resp 17   Ht 5' 8"  (1.727 m)   Wt 80.5 kg   SpO2 95%   BMI 26.98 kg/m   Examination:  General exam:  Appears calm and comfortable Respiratory system: Clear to auscultation. Respiratory effort normal. Cardiovascular system: S1 & S2 heard, RRR. No murmurs, rubs, gallops or clicks. Gastrointestinal system: Abdomen is soft and nontender. Normal bowel sounds heard. Central nervous system: Alert and oriented. No focal neurological deficits. Psychiatry: Judgement and insight appear normal. Mood & affect appropriate.    Data Reviewed: I have personally reviewed following labs and imaging studies  CBC Lab Results  Component Value Date   WBC 14.6 (H) 01/11/2022   RBC 3.31 (L) 01/11/2022   HGB 11.3 (L) 01/11/2022   HCT 33.4 (L) 01/11/2022   MCV 100.9 (H) 01/11/2022   MCH 34.1 (H) 01/11/2022   PLT 163 01/11/2022   MCHC 33.8 01/11/2022   RDW 16.9 (H) 01/11/2022   LYMPHSABS 1.0 01/09/2022   MONOABS 1.4 (H) 01/09/2022   EOSABS 0.2 01/09/2022   BASOSABS 0.0 72/11/4707     Last metabolic panel Lab Results  Component Value Date   NA 139 01/11/2022   K 4.0 01/11/2022   CL 111 01/11/2022   CO2 18 (L) 01/11/2022   BUN 34 (H) 01/11/2022   CREATININE 2.02 (H) 01/11/2022   GLUCOSE 121 (H) 01/11/2022   GFRNONAA 39 (L) 01/11/2022   CALCIUM 7.8 (L) 01/11/2022   PHOS 3.8 01/10/2022   PROT 5.9 (L) 01/11/2022   ALBUMIN 3.3 (L) 01/11/2022   BILITOT 0.3 01/11/2022   ALKPHOS 43 01/11/2022   AST 27 01/11/2022   ALT 17 01/11/2022   ANIONGAP 10 01/11/2022    GFR: Estimated Creatinine Clearance: 42.3 mL/min (A) (by C-G formula based on SCr of 2.02 mg/dL (H)).  No results found for this or any previous visit (from the past 240 hour(s)).    Radiology Studies:  DG Hand 2 View Left  Result Date: 01/10/2022 CLINICAL DATA:  Patient was bitten by dog, multiple lacerations and puncture wounds to the hand, palmar, and posterior surface EXAM: LEFT HAND - 2 VIEW COMPARISON:  None Available. FINDINGS: There is a nondisplaced comminuted fracture of the tip of the third distal phalanx. No additional acute  fracture or dislocation. The joint spaces are well-maintained. No radiopaque foreign body. IMPRESSION: Nondisplaced comminuted fracture of the tip of third distal phalanx. Electronically Signed   By: Beryle Flock M.D.   On: 01/10/2022 08:02      LOS: 1 day    Cordelia Poche, MD [facility] 01/11/2022, 6:30 PM   If 7PM-7AM, please contact night-coverage [email or web address]

## 2022-01-11 NOTE — Progress Notes (Addendum)
IP PROGRESS NOTE  Subjective:    He reports improvement in his cough.  He has a rash, but no pruritus.  Objective: Vital signs in last 24 hours: Blood pressure 135/79, pulse 63, temperature (!) 97.5 F (36.4 C), temperature source Oral, resp. rate 18, height 5' 8"  (1.727 m), weight 177 lb 7.5 oz (80.5 kg), SpO2 98 %.  Intake/Output from previous day: 10/24 0701 - 10/25 0700 In: 1360.5 [P.O.:360; I.V.:1000.5] Out: 2425 [Urine:2425]  Physical Exam:  HEENT: No thrush Lungs: Mild bilateral end expiratory wheeze, no respiratory distress Cardiac: Regular rate and rhythm Abdomen: No hepatosplenomegaly Extremities: No leg edema Skin: Erythematous macular rash over the trunk and face   Lab Results: Recent Labs    01/10/22 0828 01/11/22 0515  WBC 13.9* 14.6*  HGB 11.9* 11.3*  HCT 35.2* 33.4*  PLT 164 163    BMET Recent Labs    01/10/22 0828 01/11/22 0515  NA 135 139  K 4.3 4.0  CL 107 111  CO2 19* 18*  GLUCOSE 146* 121*  BUN 37* 34*  CREATININE 3.45* 2.02*  CALCIUM 8.1* 7.8*    No results found for: "CEA1", "CEA", "EAV409", "CA125"  Studies/Results: DG Hand 2 View Left  Result Date: 01/10/2022 CLINICAL DATA:  Patient was bitten by dog, multiple lacerations and puncture wounds to the hand, palmar, and posterior surface EXAM: LEFT HAND - 2 VIEW COMPARISON:  None Available. FINDINGS: There is a nondisplaced comminuted fracture of the tip of the third distal phalanx. No additional acute fracture or dislocation. The joint spaces are well-maintained. No radiopaque foreign body. IMPRESSION: Nondisplaced comminuted fracture of the tip of third distal phalanx. Electronically Signed   By: Beryle Flock M.D.   On: 01/10/2022 08:02    Medications: I have reviewed the patient's current medications.  Assessment/Plan: Multiple myeloma -05/22/2021-kappa free light chain 11.3, lambda free light chain 17,028 -25 g proteinuria, 20 g lambda light chains -Serum immunofixation  05/22/2021-monoclonal lambda light chains -Bone marrow biopsy 05/23/2021-hypercellular marrow involved by plasma cell myeloma, 80-90% plasma cells on the bone marrow biopsy, diminished iron stains -Cytogenetics-45 X, minus Y, t11:14 (11 q13) -Cycle 1 Velcade/Decadron 05/23/2021 (Decadron given 05/22/2021) -Cycle 1 DRVd 06/13/2021, treatment held 06/20/2021 due to a rash, Revlimid resumed 06/28/2021, daratumumab resumed 07/04/2021 Revlimid resumed 06/28/2021 Cycle 2 daratumumab/Revlimid/Decadron/Velcade 07/18/2021 (Revlimid started 07/19/2021, placed on hold 07/25/2021 secondary to a rash-last taken 07/24/2021) Treatment continued with weekly Velcade/Decadron and daratumumab Treatment changed to pomalidomide, daratumumab, Decadron 10/03/2021 Pomalidomide placed on hold 10/17/2021 due to a rash Pomalidomide resumed at a reduced dose of 2 mg daily for 21 days followed by 7-day break 10/31/2021 Pomalidomide 2 mg daily for 21 days beginning 11/28/2021 Lambda light chains increased 12/12/2021 Venetoclax/Carfilzomib/Decadron 12/26/2021, venetoclax started 01/05/2022   Renal failure secondary to #1-improved Hypercalcemia-status post treat with calcitonin, pamidronate 05/22/2021-resolved Lytic bone lesions Anemia Thrombocytopenia Hypocalcemia-IV calcium gluconate 05/30/2021, resolved Rash 06/20/2021,  consistent with a drug rash, recurrent rash 07/24/2021-Revlimid placed on hold; recurrent rash 10/17/2021 Admission 01/09/2022 with renal failure, elevated uric acid, rasburicase 01/09/2022 Wheezing/cough-likely secondary to COPD and asthma   Jordan King appears stable.  The creatinine and uric acid are improved today.  The etiology of the acute renal failure is unclear.  I doubt the renal failure is related to progression of the myeloma since it developed rapidly and has improved over 24 hours.  He may have developed tumor lysis from venetoclax.  I appreciate the consult from the nephrology service.  I will add allopurinol if he resumes  venetoclax.  He has developed a rash.  I suspect the rash is due to Bactrim he started 01/05/2022.  However it is possible the rash is related to venetoclax, or less likely the other antibiotics he started 01/05/2022.   Recommendations: 1.  Continue intravenous hydration 2.  Follow-up creatinine and uric acid t tomorrow morning 3.  Continue to hold venetoclax 4.  List Bactrim as an allergy 5.  Continue Augmentin for the dog bites   LOS: 1 day   Betsy Coder, MD   01/11/2022, 1:28 PM

## 2022-01-11 NOTE — Progress Notes (Signed)
[facility] Progress Note  Name: Jordan King. MRN: [id number] DOB: [birth year]  Chief Complaint:  Presented from clinic after abnormal labs  Subjective:  he had 2.4 liters UOP over 10/24.  He has been on NS at 125 ml/hr.  He feels good and is grateful labs are better.  He was told if labs better in the am that he would be able to go tomorrow and this sounds reasonable to me   Review of systems:  Denies shortness of breath or chest pain  Denies n/v No issues with ambulation  Urinating without difficulty -------------  Background on consult:  Jordan King. is a 51 y.o. male with a history of asthma and multiple myeloma who presented to the hospital from the oncology office with AKI and concern for possible tumor lysis syndrome.  Creatinine trends as below.  Baseline creatinine 1.1 as recently as 01/02/2022.  Note that he did have AKI with a peak creatinine of 4.14 in March of this year as well.  His uric acid had been markedly elevated at 17.3 and he was given rasburicase and fluids with improvement to uric acid of 2.7.  Oncology is concerned that he has developed tumor lysis syndrome from the venetoclax as this was recently reinitiated.  Note that he was recently initiated on bactrim ppx.  Oncology has contacted hem/onc at [facility] as the patient has had adverse reactions to multiple prior agents - they are recommending to hold venetoclax for now and then to resume at a lower dose.  Note that he has an injury to his arm as he recently broke up a dog fight this weekend.  Dog is still needing to get to the vet and he almost didn't agree to come to the hospital 2/2 the same.  Short of breath today but states that is typical for him with his chemo schedule.  He states he has had a lot of urine output.  He has been on NS at 125 ml/hr.  Strict ins/outs not available.  Had 800 ml UOP charted over 10/23.   Intake/Output Summary (Last 24 hours) at 01/11/2022 1759 Last data filed  at 01/11/2022 1344 Gross per 24 hour  Intake 1692.45 ml  Output 1925 ml  Net -232.55 ml    Vitals:  Vitals:   01/10/22 1343 01/10/22 2249 01/11/22 0514 01/11/22 1341  BP: 133/68 115/68 135/79 137/75  Pulse: 89 77 63 70  Resp: _0 Temp: 98.3 F (36.8 C) 98 F (36.7 C) (!) 97.5 F (36.4 C) 97.7 F (36.5 C)  TempSrc: Oral Oral Oral Oral  SpO2: 99% 96% 98% 95%  Weight:      Height:         Physical Exam:  General:  adult male in bed in NAD  HEENT: NCAT Eyes: EOMI sclera anicteric Neck: supple trachea midline  Heart: S1S2 no rub Lungs: clear and unlabored on room air  Abdomen: soft/nt/nd Extremities: no edema appreciated; no cyanosis or clubbing Psych: normal mood and affect Skin: bruising on elbows with puncture sites; left hand is wrapped with gauze Neuro: alert and oriented x 3 provides hx and follows commands   Medications reviewed   Labs:     Latest Ref Rng & Units 01/11/2022    5:15 AM 01/10/2022    8:28 AM 01/09/2022    9:55 AM  BMP  Glucose 70 - 99 mg/dL 121  146  125   BUN 6 - 20 mg/dL 34  37  28   Creatinine 0.61 - 1.24 mg/dL 2.02  3.45  2.96   Sodium 135 - 145 mmol/L 139  135  133   Potassium 3.5 - 5.1 mmol/L 4.0  4.3  3.9   Chloride 98 - 111 mmol/L 111  107  99   CO2 22 - 32 mmol/L _0 Calcium 8.9 - 10.3 mg/dL 7.8  8.1  9.6      Assessment/Plan:   # AKI  - with hyperuricemia concerning for tumor lysis syndrome.  Hx of AKI in 05/2021 secondary to multiple myeloma  - continue fluids and is s/p rasburicase  - note he was recently initiated on bactrim - this is now off in favor of augmentin for better coverage for his dog bite - improving with supportive care   # Hyperuricemia  - s/p rasburicase  - Continue fluids  - consider allopurinol per oncology    # Multiple myeloma  - agents per oncology - venetoclax on hold; difficult situation with prior intolerances    # Anemia - normocytic  - setting of multiple myeloma; mild     # Dog bite  - wound care and abx per discretion of primary team - he is now on augmentin   Disposition - continue inpatient monitoring and if AKI continues to improve then would likely be acceptable for discharge tomorrow (from a strictly renal standpoint).  He lives 2 hours away.  Would recommend repeat labs on close follow-up with oncology and please refer back to renal if labs worsening outpatient - we are happy to see but his visits with [place] had been PRN due to the commute.    Claudia Desanctis, MD 01/11/2022 6:13 PM

## 2022-01-12 ENCOUNTER — Encounter: Payer: Self-pay | Admitting: *Deleted

## 2022-01-12 DIAGNOSIS — N179 Acute kidney failure, unspecified: Secondary | ICD-10-CM | POA: Diagnosis not present

## 2022-01-12 LAB — COMPREHENSIVE METABOLIC PANEL
ALT: 16 U/L (ref 0–44)
AST: 21 U/L (ref 15–41)
Albumin: 3 g/dL — ABNORMAL LOW (ref 3.5–5.0)
Alkaline Phosphatase: 39 U/L (ref 38–126)
Anion gap: 4 — ABNORMAL LOW (ref 5–15)
BUN: 25 mg/dL — ABNORMAL HIGH (ref 6–20)
CO2: 23 mmol/L (ref 22–32)
Calcium: 7.7 mg/dL — ABNORMAL LOW (ref 8.9–10.3)
Chloride: 113 mmol/L — ABNORMAL HIGH (ref 98–111)
Creatinine, Ser: 1.33 mg/dL — ABNORMAL HIGH (ref 0.61–1.24)
GFR, Estimated: >60 mL/min (ref >60)
Glucose, Bld: 99 mg/dL (ref 70–99)
Potassium: 4.2 mmol/L (ref 3.5–5.1)
Sodium: 140 mmol/L (ref 135–145)
Total Bilirubin: 0.2 mg/dL — ABNORMAL LOW (ref 0.3–1.2)
Total Protein: 5.3 g/dL — ABNORMAL LOW (ref 6.5–8.1)

## 2022-01-12 LAB — URIC ACID: Uric Acid, Serum: <1.5 mg/dL — ABNORMAL LOW (ref 3.7–8.6)

## 2022-01-12 MED ORDER — AMOXICILLIN-POT CLAVULANATE 500-125 MG PO TABS
500.0000 mg | ORAL_TABLET | Freq: Once | ORAL | Status: AC
  Administered 2022-01-12: 1 via ORAL
  Filled 2022-01-12: qty 1

## 2022-01-12 MED ORDER — AMOXICILLIN-POT CLAVULANATE 500-125 MG PO TABS: 500.0000 mg | ORAL_TABLET | Freq: Two times a day (BID) | ORAL | 0 refills | Status: AC

## 2022-01-12 NOTE — Discharge Summary (Signed)
Physician Discharge Summary   Patient: Jordan King. MRN: [id number] DOB: Sep 12, 1970  Admit date:     01/09/2022  Discharge date: 01/12/22  Discharge Physician: Cordelia Poche, MD   PCP: Ayesha Mohair, MD   Recommendations at discharge:  Follow-up with PCP and medical oncology  Discharge Diagnoses: Principal Problem:   AKI (acute kidney injury) ([place]) Active Problems:   Multiple myeloma ([place])   Tumor lysis syndrome   GERD (gastroesophageal reflux disease)   Asthma, chronic   Anxiety   Dog bite   Hyponatremia  Resolved Problems:   * No resolved hospital problems. *  Hospital Course: Jordan King. is a 51 y.o. male with a history of multiple myeloma, asthma and smoking. Patient presented secondary to elevated creatinine with evidence of AKI likely secondary to Venetoclax. Venetoclax discontinued and IV fluids initiated. Associated tumor lysis syndrome, treated with rasburicase. Nephrology and hematology/oncology consulted. AKI improving.  Assessment and Plan: * AKI (acute kidney injury) ([place]) Baseline creatinine of 1.2. Creatinine of 3.45 on admission. In setting of Venetoclax and tumor lysis syndrome. Ventetoclax and Bactrim held. Creatinine down to 1.33 prior to discharge.  Multiple myeloma ([place]) On carfilzomib/venetoclax/decadron as an outpatient. Oncology consulted. Venetoclax discontinued. Levaquin and Bactrim held. Continue Valtrex. Levaquin, Bactrim and fluconazole held secondary to discontinuation of Venetoclax  Tumor lysis syndrome-resolved as of 01/12/2022 Uric acid 17 on admission. Given rasburicase per pharmacy protocol, urate down to undetectable levels.  Dog bite Continue Augmentin  Anxiety Continue Ativan.  Asthma, chronic Stable. Continue Albuterol.  GERD (gastroesophageal reflux disease) Continue Protonix  Hyponatremia-resolved as of 01/12/2022 Mild. Resolved with IV fluids.   Consultants: Medical oncology, Nephrology Procedures  performed: None  Disposition: Home Diet recommendation: Regular diet  DISCHARGE MEDICATION: Allergies as of 01/12/2022       Reactions   Hydrocodone-acetaminophen Shortness Of Breath   Lenalidomide Anaphylaxis, Swelling, Rash, Other (See Comments)   Severe rash and throat started to close   Venetoclax Other (See Comments)   Suspected "tumor lysis syndrome"   Pomalidomide Rash, Other (See Comments)   Sold under the brand names Pomalyst and Imnovid        Medication List     STOP taking these medications    dexamethasone 4 MG tablet Commonly known as: DECADRON   fluconazole 200 MG tablet Commonly known as: DIFLUCAN   levofloxacin 500 MG tablet Commonly known as: Levaquin   sulfamethoxazole-trimethoprim 800-160 MG tablet Commonly known as: BACTRIM DS   Venclexta 100 MG tablet Generic drug: venetoclax       TAKE these medications    albuterol 108 (90 Base) MCG/ACT inhaler Commonly known as: VENTOLIN HFA Inhale 2 puffs into the lungs every 6 (six) hours as needed for wheezing or shortness of breath. What changed:  when to take this additional instructions   amoxicillin-clavulanate 500-125 MG tablet Commonly known as: AUGMENTIN Take 1 tablet by mouth every 12 (twelve) hours for 3 days.   aspirin EC 81 MG tablet Take 81 mg by mouth at bedtime. Swallow whole.   calcium carbonate 750 MG chewable tablet Commonly known as: TUMS EX Chew 2 tablets by mouth 3 (three) times daily as needed for heartburn.   clotrimazole-betamethasone cream Commonly known as: LOTRISONE Apply 1 Application topically 2 (two) times daily as needed.   cyclobenzaprine 10 MG tablet Commonly known as: FLEXERIL Take 10 mg by mouth 3 (three) times daily as needed for muscle spasms.   ipratropium 0.06 % nasal spray Commonly known as: ATROVENT  Place 1 spray into both nostrils See admin instructions. Instill  spray into both nostrils one to three times a day   loratadine 10 MG  tablet Commonly known as: CLARITIN Take 10 mg by mouth daily.   LORazepam 0.5 MG tablet Commonly known as: ATIVAN TAKE 1 TABLET(0.5 MG) BY MOUTH EVERY 8 HOURS AS NEEDED FOR ANXIETY What changed:  how much to take how to take this when to take this additional instructions   NICODERM CQ TD Place 1 patch onto the skin daily as needed (for smoking cessation while hospitalized).   oxyCODONE-acetaminophen 5-325 MG tablet Commonly known as: PERCOCET/ROXICET Take 1 tablet by mouth every 6 (six) hours as needed for severe pain.   pantoprazole 20 MG tablet Commonly known as: Protonix Take 1 tablet (20 mg total) by mouth daily. What changed: when to take this   polyethylene glycol 17 g packet Commonly known as: MIRALAX / GLYCOLAX Take 17 g by mouth daily as needed for mild constipation. What changed: when to take this   prochlorperazine 10 MG tablet Commonly known as: COMPAZINE TAKE 1 TABLET(10 MG) BY MOUTH EVERY 6 HOURS AS NEEDED FOR NAUSEA OR VOMITING What changed:  how much to take how to take this when to take this reasons to take this additional instructions   valACYclovir 500 MG tablet Commonly known as: VALTREX Take 1 tablet (500 mg total) by mouth 2 (two) times daily. What changed: when to take this        Discharge Exam: BP 136/83 (BP Location: Right Arm)   Pulse 67   Temp 98 F (36.7 C) (Oral)   Resp 18   Ht _0  (1.727 m)   Wt 80.5 kg   SpO2 98%   BMI 26.98 kg/m   General exam: Appears calm and comfortable Respiratory system: Clear to auscultation. Respiratory effort normal. Cardiovascular system: S1 & S2 heard, RRR. Gastrointestinal system: Abdomen is nondistended, soft and nontender. Normal bowel sounds heard. Central nervous system: Alert and oriented. No focal neurological deficits. Psychiatry: Judgement and insight appear normal. Mood & affect appropriate.   Condition at discharge: stable  The results of significant diagnostics from this  hospitalization (including imaging, microbiology, ancillary and laboratory) are listed below for reference.   Imaging Studies: DG Hand 2 View Left  Result Date: 01/10/2022 CLINICAL DATA:  Patient was bitten by dog, multiple lacerations and puncture wounds to the hand, palmar, and posterior surface EXAM: LEFT HAND - 2 VIEW COMPARISON:  None Available. FINDINGS: There is a nondisplaced comminuted fracture of the tip of the third distal phalanx. No additional acute fracture or dislocation. The joint spaces are well-maintained. No radiopaque foreign body. IMPRESSION: Nondisplaced comminuted fracture of the tip of third distal phalanx. Electronically Signed   By: Beryle Flock M.D.   On: 01/10/2022 08:02    Microbiology: Results for orders placed or performed during the hospital encounter of 05/21/21  Resp Panel by RT-PCR (Flu A&B, Covid) Nasopharyngeal Swab     Status: None   Collection Time: 05/21/21  9:23 PM   Specimen: Nasopharyngeal Swab; Nasopharyngeal(NP) swabs in vial transport medium  Result Value Ref Range Status   SARS Coronavirus 2 by RT PCR NEGATIVE NEGATIVE Final    Comment: (NOTE) SARS-CoV-2 target nucleic acids are NOT DETECTED.  The SARS-CoV-2 RNA is generally detectable in upper respiratory specimens during the acute phase of infection. The lowest concentration of SARS-CoV-2 viral copies this assay can detect is 138 copies/mL. A negative result does not preclude SARS-Cov-2  infection and should not be used as the sole basis for treatment or other patient management decisions. A negative result may occur with  improper specimen collection/handling, submission of specimen other than nasopharyngeal swab, presence of viral mutation(s) within the areas targeted by this assay, and inadequate number of viral copies(<138 copies/mL). A negative result must be combined with clinical observations, patient history, and epidemiological information. The expected result is  Negative.  Fact Sheet for Patients:  EntrepreneurPulse.com.au  Fact Sheet for Healthcare Providers:  IncredibleEmployment.be  This test is no t yet approved or cleared by the [place] FDA and  has been authorized for detection and/or diagnosis of SARS-CoV-2 by FDA under an Emergency Use Authorization (EUA). This EUA will remain  in effect (meaning this test can be used) for the duration of the COVID-19 declaration under Section 564(b)(1) of the Act, 21 U.S.C.section 360bbb-3(b)(1), unless the authorization is terminated  or revoked sooner.       Influenza A by PCR NEGATIVE NEGATIVE Final   Influenza B by PCR NEGATIVE NEGATIVE Final    Comment: (NOTE) The Xpert Xpress SARS-CoV-2/FLU/RSV plus assay is intended as an aid in the diagnosis of influenza from Nasopharyngeal swab specimens and should not be used as a sole basis for treatment. Nasal washings and aspirates are unacceptable for Xpert Xpress SARS-CoV-2/FLU/RSV testing.  Fact Sheet for Patients: EntrepreneurPulse.com.au  Fact Sheet for Healthcare Providers: IncredibleEmployment.be  This test is not yet approved or cleared by the [place] FDA and has been authorized for detection and/or diagnosis of SARS-CoV-2 by FDA under an Emergency Use Authorization (EUA). This EUA will remain in effect (meaning this test can be used) for the duration of the COVID-19 declaration under Section 564(b)(1) of the Act, 21 U.S.C. section 360bbb-3(b)(1), unless the authorization is terminated or revoked.  Performed at [facility], [place] [address], [place], [place] [address]     Labs: CBC: Recent Labs  Lab 01/09/22 [phone] 01/10/22 0828 01/11/22 0515  WBC 14.4* 13.9* 14.6*  NEUTROABS 11.8*  --   --   HGB 13.5 11.9* 11.3*  HCT 39.2 35.2* 33.4*  MCV 95.4 99.7 100.9*  PLT 160 164 383   Basic Metabolic Panel: Recent Labs  Lab 01/09/22 0955  01/10/22 0828 01/11/22 0515 01/12/22 0513  NA 133* 135 139 140  K 3.9 4.3 4.0 4.2  CL 99 107 111 113*  CO2 20* 19* 18* 23  GLUCOSE 125* 146* 121* 99  BUN 28* 37* 34* 25*  CREATININE 2.96* 3.45* 2.02* 1.33*  CALCIUM 9.6 8.1* 7.8* 7.7*  PHOS 4.0 3.8  --   --    Liver Function Tests: Recent Labs  Lab 01/09/22 0955 01/10/22 0828 01/11/22 0515 01/12/22 0513  AST _0 ALT _1 ALKPHOS 53 48 43 39  BILITOT 0.5 0.5 0.3 0.2*  PROT 6.7 6.4* 5.9* 5.3*  ALBUMIN 4.2 3.5 3.3* 3.0*    Discharge time spent: 35 minutes.  Signed: Cordelia Poche, MD [facility] 01/12/2022

## 2022-01-12 NOTE — Discharge Instructions (Addendum)
Colbert Coyer.,  You were in the hospital because of worsened kidney function. This has improved with IV fluids. Please stop taking venetoclax as recommended by your oncologist. Please follow-up with your PCP and oncologist. Regarding your arm, please continue antibiotics and follow-up with your primary care physician. If you have worsening pain, redness or develop fevers, you should return to the hospital for reevaluation.

## 2022-01-12 NOTE — Progress Notes (Signed)
IP PROGRESS NOTE  Subjective:  He feels well.  The cough has improved.  No new complaint.  Objective: Vital signs in last 24 hours: Blood pressure 136/83, pulse 67, temperature 98 F (36.7 C), temperature source Oral, resp. rate 18, height _0  (1.727 m), weight 177 lb 7.5 oz (80.5 kg), SpO2 98 %.  Intake/Output from previous day: 10/25 0701 - 10/26 0700 In: 2297 [P.O.:1172; I.V.:1125] Out: 3900 [Urine:3900]  Physical Exam:  HEENT: No thrush Lungs: Clear bilaterally, no respiratory distress Cardiac: Regular rate and rhythm Abdomen: No hepatosplenomegaly Extremities: No leg edema Skin: Fading erythematous macular rash over the trunk and face, abrasions at the forearms and hands appear to be healing.   Lab Results: Recent Labs    01/10/22 0828 01/11/22 0515  WBC 13.9* 14.6*  HGB 11.9* 11.3*  HCT 35.2* 33.4*  PLT 164 163    BMET Recent Labs    01/11/22 0515 01/12/22 0513  NA 139 140  K 4.0 4.2  CL 111 113*  CO2 18* 23  GLUCOSE 121* 99  BUN 34* 25*  CREATININE 2.02* 1.33*  CALCIUM 7.8* 7.7*    No results found for: "CEA1", "CEA", "PRX458", "CA125"  Studies/Results: No results found.  Medications: I have reviewed the patient's current medications.  Assessment/Plan: Multiple myeloma -05/22/2021-kappa free light chain 11.3, lambda free light chain 17,028 -25 g proteinuria, 20 g lambda light chains -Serum immunofixation 05/22/2021-monoclonal lambda light chains -Bone marrow biopsy 05/23/2021-hypercellular marrow involved by plasma cell myeloma, 80-90% plasma cells on the bone marrow biopsy, diminished iron stains -Cytogenetics-45 X, minus Y, t11:14 (11 q13) -Cycle 1 Velcade/Decadron 05/23/2021 (Decadron given 05/22/2021) -Cycle 1 DRVd 06/13/2021, treatment held 06/20/2021 due to a rash, Revlimid resumed 06/28/2021, daratumumab resumed 07/04/2021 Revlimid resumed 06/28/2021 Cycle 2 daratumumab/Revlimid/Decadron/Velcade 07/18/2021 (Revlimid started 07/19/2021, placed on hold  07/25/2021 secondary to a rash-last taken 07/24/2021) Treatment continued with weekly Velcade/Decadron and daratumumab Treatment changed to pomalidomide, daratumumab, Decadron 10/03/2021 Pomalidomide placed on hold 10/17/2021 due to a rash Pomalidomide resumed at a reduced dose of 2 mg daily for 21 days followed by 7-day break 10/31/2021 Pomalidomide 2 mg daily for 21 days beginning 11/28/2021 Lambda light chains increased 12/12/2021 Venetoclax/Carfilzomib/Decadron 12/26/2021, venetoclax started 01/05/2022   Renal failure secondary to #1-improved Hypercalcemia-status post treat with calcitonin, pamidronate 05/22/2021-resolved Lytic bone lesions Anemia Thrombocytopenia Hypocalcemia-IV calcium gluconate 05/30/2021, resolved Rash 06/20/2021,  consistent with a drug rash, recurrent rash 07/24/2021-Revlimid placed on hold; recurrent rash 10/17/2021 Admission 01/09/2022 with renal failure, elevated uric acid, rasburicase 01/09/2022 Wheezing/cough-likely secondary to COPD and asthma   Mr. Dy appears stable.  The creatinine is improved again today.  The uric acid remains low.  He appears stable for discharge to home.  The erythematous rash is fading.  It remains unclear whether the rash is related to Bactrim or venetoclax.  I recommended he increase oral hydration.  A follow-up appoint will be scheduled at the Cancer center on 01/16/2022.  The plan is to administer carfilzomib/Decadron on 01/16/2022.  We will consider resuming venetoclax at a reduced dose.  The abrasions and puncture wounds at the lower arms and hands appear to be healing.  Recommendations: 1.  Okay for discharge from an oncology standpoint 2.  Push oral hydration 3.  Continue to hold venetoclax and Bactrim 4.  Follow-up at the Cancer center 01/16/2022 5.  Continue aspirin and Valtrex   LOS: 2 days   Betsy Coder, MD   01/12/2022, 8:23 AM

## 2022-01-12 NOTE — Progress Notes (Signed)
Hospital D/C soon. Per Dr. Benay Spice: schedule for lab/OV/carfilzomib on 10/30. High priority scheduling message sent.

## 2022-01-12 NOTE — Progress Notes (Signed)
Patient discharged to home with family, discharge instructions reviewed with patient who verbalized understanding. 

## 2022-01-16 ENCOUNTER — Inpatient Hospital Stay: Payer: BLUE CROSS/BLUE SHIELD

## 2022-01-16 ENCOUNTER — Inpatient Hospital Stay (HOSPITAL_BASED_OUTPATIENT_CLINIC_OR_DEPARTMENT_OTHER): Payer: BLUE CROSS/BLUE SHIELD | Admitting: Nurse Practitioner

## 2022-01-16 ENCOUNTER — Encounter: Payer: Self-pay | Admitting: Nurse Practitioner

## 2022-01-16 VITALS — BP 128/90 | HR 90 | Temp 98.2°F | Resp 18 | Ht 68.0 in | Wt 175.0 lb

## 2022-01-16 VITALS — BP 119/75 | HR 80

## 2022-01-16 DIAGNOSIS — D696 Thrombocytopenia, unspecified: Secondary | ICD-10-CM | POA: Diagnosis not present

## 2022-01-16 DIAGNOSIS — Z79899 Other long term (current) drug therapy: Secondary | ICD-10-CM | POA: Diagnosis not present

## 2022-01-16 DIAGNOSIS — R944 Abnormal results of kidney function studies: Secondary | ICD-10-CM | POA: Diagnosis not present

## 2022-01-16 DIAGNOSIS — C9000 Multiple myeloma not having achieved remission: Secondary | ICD-10-CM

## 2022-01-16 DIAGNOSIS — Z5112 Encounter for antineoplastic immunotherapy: Secondary | ICD-10-CM | POA: Diagnosis present

## 2022-01-16 DIAGNOSIS — R5383 Other fatigue: Secondary | ICD-10-CM | POA: Diagnosis not present

## 2022-01-16 DIAGNOSIS — D649 Anemia, unspecified: Secondary | ICD-10-CM | POA: Diagnosis not present

## 2022-01-16 LAB — CBC WITH DIFFERENTIAL (CANCER CENTER ONLY)
Abs Immature Granulocytes: 0.05 10*3/uL (ref 0.00–0.07)
Basophils Absolute: 0 10*3/uL (ref 0.0–0.1)
Basophils Relative: 0 %
Eosinophils Absolute: 0.3 10*3/uL (ref 0.0–0.5)
Eosinophils Relative: 2 %
HCT: 42.2 % (ref 39.0–52.0)
Hemoglobin: 14.8 g/dL (ref 13.0–17.0)
Immature Granulocytes: 0 %
Lymphocytes Relative: 16 %
Lymphs Abs: 2.3 10*3/uL (ref 0.7–4.0)
MCH: 33.9 pg (ref 26.0–34.0)
MCHC: 35.1 g/dL (ref 30.0–36.0)
MCV: 96.8 fL (ref 80.0–100.0)
Monocytes Absolute: 1.5 10*3/uL — ABNORMAL HIGH (ref 0.1–1.0)
Monocytes Relative: 10 %
Neutro Abs: 10.5 10*3/uL — ABNORMAL HIGH (ref 1.7–7.7)
Neutrophils Relative %: 72 %
Platelet Count: 268 10*3/uL (ref 150–400)
RBC: 4.36 MIL/uL (ref 4.22–5.81)
RDW: 15.7 % — ABNORMAL HIGH (ref 11.5–15.5)
WBC Count: 14.6 10*3/uL — ABNORMAL HIGH (ref 4.0–10.5)
nRBC: 0 % (ref 0.0–0.2)

## 2022-01-16 LAB — CMP (CANCER CENTER ONLY)
ALT: 11 U/L (ref 0–44)
AST: 9 U/L — ABNORMAL LOW (ref 15–41)
Albumin: 4.5 g/dL (ref 3.5–5.0)
Alkaline Phosphatase: 46 U/L (ref 38–126)
Anion gap: 11 (ref 5–15)
BUN: 22 mg/dL — ABNORMAL HIGH (ref 6–20)
CO2: 25 mmol/L (ref 22–32)
Calcium: 10.8 mg/dL — ABNORMAL HIGH (ref 8.9–10.3)
Chloride: 100 mmol/L (ref 98–111)
Creatinine: 1.35 mg/dL — ABNORMAL HIGH (ref 0.61–1.24)
GFR, Estimated: >60 mL/min (ref >60)
Glucose, Bld: 107 mg/dL — ABNORMAL HIGH (ref 70–99)
Potassium: 3.9 mmol/L (ref 3.5–5.1)
Sodium: 136 mmol/L (ref 135–145)
Total Bilirubin: 0.4 mg/dL (ref 0.3–1.2)
Total Protein: 7.2 g/dL (ref 6.5–8.1)

## 2022-01-16 MED ORDER — OXYCODONE-ACETAMINOPHEN 5-325 MG PO TABS: 1.0000 | ORAL_TABLET | Freq: Four times a day (QID) | ORAL | 0 refills | Status: DC | PRN

## 2022-01-16 MED ORDER — ZOLEDRONIC ACID 4 MG/100ML IV SOLN
4.0000 mg | Freq: Once | INTRAVENOUS | Status: AC
  Administered 2022-01-16: 4 mg via INTRAVENOUS
  Filled 2022-01-16: qty 100

## 2022-01-16 MED ORDER — DEXAMETHASONE 4 MG PO TABS: 20.0000 mg | ORAL_TABLET | Freq: Once | ORAL | Status: DC

## 2022-01-16 MED ORDER — ALLOPURINOL 100 MG PO TABS: 200.0000 mg | ORAL_TABLET | Freq: Every day | ORAL | 0 refills | Status: DC

## 2022-01-16 MED ORDER — DEXTROSE 5 % IV SOLN
56.0000 mg/m2 | Freq: Once | INTRAVENOUS | Status: AC
  Administered 2022-01-16: 110 mg via INTRAVENOUS
  Filled 2022-01-16: qty 30

## 2022-01-16 MED ORDER — LORAZEPAM 0.5 MG PO TABS: ORAL_TABLET | ORAL | 0 refills | Status: DC

## 2022-01-16 MED ORDER — SODIUM CHLORIDE 0.9 % IV SOLN: Freq: Once | INTRAVENOUS | Status: AC

## 2022-01-16 NOTE — Progress Notes (Signed)
[facility] OFFICE PROGRESS NOTE   Diagnosis: Multiple myeloma  INTERVAL HISTORY:   [name] returns as scheduled.  He reports the dog bites are healing.  He feels "exhausted".  He is having some difficulty sleeping.  No fevers.  A couple of night sweats.  Appetite is stable.  Rash has resolved.  Objective:  Vital signs in last 24 hours:  Blood pressure (!) 128/90, pulse 90, temperature 98.2 F (36.8 C), temperature source Oral, resp. rate 18, height _0  (1.727 m), weight 175 lb (79.4 kg), SpO2 100 %.    HEENT: No thrush or ulcers. Resp: Lungs clear bilaterally. Cardio: Regular rate and rhythm. GI: No hepatosplenomegaly. Vascular: No leg edema. Neuro: Alert and oriented. Skin: Healing abrasions over the forearms.  No rash.   Lab Results:  Lab Results  Component Value Date   WBC 14.6 (H) 01/16/2022   HGB 14.8 01/16/2022   HCT 42.2 01/16/2022   MCV 96.8 01/16/2022   PLT 268 01/16/2022   NEUTROABS 10.5 (H) 01/16/2022    Imaging:  No results found.  Medications: I have reviewed the patient's current medications.  Assessment/Plan: Multiple myeloma -05/22/2021-kappa free light chain 11.3, lambda free light chain 17,028 -25 g proteinuria, 20 g lambda light chains -Serum immunofixation 05/22/2021-monoclonal lambda light chains -Bone marrow biopsy 05/23/2021-hypercellular marrow involved by plasma cell myeloma, 80-90% plasma cells on the bone marrow biopsy, diminished iron stains -Cytogenetics-45 X, minus Y, t11:14 (11 q13) -Cycle 1 Velcade/Decadron 05/23/2021 (Decadron given 05/22/2021) -Cycle 1 DRVd 06/13/2021, treatment held 06/20/2021 due to a rash, Revlimid resumed 06/28/2021, daratumumab resumed 07/04/2021 Revlimid resumed 06/28/2021 Cycle 2 daratumumab/Revlimid/Decadron/Velcade 07/18/2021 (Revlimid started 07/19/2021, placed on hold 07/25/2021 secondary to a rash-last taken 07/24/2021) Treatment continued with weekly Velcade/Decadron and daratumumab Treatment changed  to pomalidomide, daratumumab, Decadron 10/03/2021 Pomalidomide placed on hold 10/17/2021 due to a rash Pomalidomide resumed at a reduced dose of 2 mg daily for 21 days followed by 7-day break 10/31/2021 Pomalidomide 2 mg daily for 21 days beginning 11/28/2021 Lambda light chains increased 12/12/2021 Venetoclax/Carfilzomib/Decadron 12/26/2021, venetoclax started 01/05/2022 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  He is scheduled to receive Carfilzomib today.  He will resume venetoclax at a reduced dose of 100 mg daily on 01/19/2022.  He will begin allopurinol 200 mg daily the day prior to resuming venetoclax.  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  He also understands recurrence of the rash is possible as it is unclear which medication was the offending agent at the time of the rash last week.  He is in agreement with this plan.  We reviewed the CBC and chemistry panel from today.  Labs adequate to proceed as above.  Calcium level is elevated.  He will receive Zometa today.  We will see him in follow-up in 1 week.  Patient seen with Dr. Benay Spice.  Ned Card ANP/GNP-BC   01/16/2022  11:00 AM This was a shared visit with Ned Card.  Jordan King appears well.  The rash has resolved.  His renal function is mildly diminished and stable since  discharge from the hospital.  The calcium level is  higher today.  It is possible the acute renal insufficiency last week was related to tumor lysis syndrome versus progression of myeloma. I reviewed the case with Dr. Feliciana Rossetti.  Jordan King will begin allopurinol prophylaxis and resume a reduced dose of venetoclax on 01/19/2022.  He will return for an office and lab visit on 01/23/2022.  Jordan King will continue weekly carfilzomib/Decadron. He has hypercalcemia today.  He will receive Zometa.  I was present for greater than 50% of today's visit.  I performed medical decision making.  Julieanne Manson, MD

## 2022-01-16 NOTE — Patient Instructions (Signed)
Jordan King   Discharge Instructions: Thank you for choosing [facility] to provide your oncology and hematology care.   If you have a lab appointment with the [facility], please go directly to the [facility] and check in at the registration area.   Wear comfortable clothing and clothing appropriate for easy access to any Portacath or PICC line.   We strive to give you quality time with your provider. You may need to reschedule your appointment if you arrive late (15 or more minutes).  Arriving late affects you and other patients whose appointments are after yours.  Also, if you miss three or more appointments without notifying the office, you may be dismissed from the clinic at the provider's discretion.      For prescription refill requests, have your pharmacy contact our office and allow 72 hours for refills to be completed.    Today you received the following chemotherapy and/or immunotherapy agents Kyprolis.       To help prevent nausea and vomiting after your treatment, we encourage you to take your nausea medication as directed.  BELOW ARE SYMPTOMS THAT SHOULD BE REPORTED IMMEDIATELY: *FEVER GREATER THAN 100.4 F (38 C) OR HIGHER *CHILLS OR SWEATING *NAUSEA AND VOMITING THAT IS NOT CONTROLLED WITH YOUR NAUSEA MEDICATION *UNUSUAL SHORTNESS OF BREATH *UNUSUAL BRUISING OR BLEEDING *URINARY PROBLEMS (pain or burning when urinating, or frequent urination) *BOWEL PROBLEMS (unusual diarrhea, constipation, pain near the anus) TENDERNESS IN MOUTH AND THROAT WITH OR WITHOUT PRESENCE OF ULCERS (sore throat, sores in mouth, or a toothache) UNUSUAL RASH, SWELLING OR PAIN  UNUSUAL VAGINAL DISCHARGE OR ITCHING   Items with * indicate a potential emergency and should be followed up as soon as possible or go to the Emergency Department if any problems should occur.  Please show the CHEMOTHERAPY ALERT CARD or IMMUNOTHERAPY ALERT CARD at check-in to  the Emergency Department and triage nurse.  Should you have questions after your visit or need to cancel or reschedule your appointment, please contact [facility]  Dept: [phone]  and follow the prompts.  Office hours are 8:00 a.m. to 4:30 p.m. Monday - Friday. Please note that voicemails left after 4:00 p.m. may not be returned until the following business day.  We are closed weekends and major holidays. You have access to a nurse at all times for urgent questions. Please call the main number to the clinic Dept: [phone] and follow the prompts.   For any non-urgent questions, you may also contact your provider using MyChart. We now offer e-Visits for anyone 66 and older to request care online for non-urgent symptoms. For details visit [email or web address].   Also download the MyChart app! Go to the app store, search "MyChart", open the app, select [facility], and log in with your MyChart username and password.  Masks are optional in the cancer centers. If you would like for your care team to wear a mask while they are taking care of you, please let them know. You may have one support person who is at least 50 years old accompany you for your appointments.  Carfilzomib Injection What is this medication? CARFILZOMIB (kar FILZ oh mib) treats multiple myeloma, a type of bone marrow cancer. It works by blocking a protein that causes cancer cells to grow and multiply. This helps to slow or stop the spread of cancer cells. This medicine may be used for other purposes; ask your health care provider or  pharmacist if you have questions. COMMON BRAND NAME(S): KYPROLIS What should I tell my care team before I take this medication? They need to know if you have any of these conditions: Heart disease History of blood clots Irregular heartbeat Kidney disease Liver disease Lung or breathing disease An unusual or allergic reaction to carfilzomib, or other medications,  foods, dyes, or preservatives If you or your partner are pregnant or trying to get pregnant Breastfeeding How should I use this medication? This medication is injected into a vein. It is given by your care team in a hospital or clinic setting. Talk to your care team about the use of this medication in children. Special care may be needed. Overdosage: If you think you have taken too much of this medicine contact a poison control center or emergency room at once. NOTE: This medicine is only for you. Do not share this medicine with others. What if I miss a dose? Keep appointments for follow-up doses. It is important not to miss your dose. Call your care team if you are unable to keep an appointment. What may interact with this medication? Interactions are not expected. This list may not describe all possible interactions. Give your health care provider a list of all the medicines, herbs, non-prescription drugs, or dietary supplements you use. Also tell them if you smoke, drink alcohol, or use illegal drugs. Some items may interact with your medicine. What should I watch for while using this medication? Your condition will be monitored carefully while you are receiving this medication. You may need blood work while taking this medication. Check with your care team if you have severe diarrhea, nausea, and vomiting, or if you sweat a lot. The loss of too much body fluid may make it dangerous for you to take this medication. This medication may affect your coordination, reaction time, or judgment. Do not drive or operate machinery until you know how this medication affects you. Sit up or stand slowly to reduce the risk of dizzy or fainting spells. Drinking alcohol with this medication can increase the risk of these side effects. Talk to your care team if you may be pregnant. Serious birth defects can occur if you take this medication during pregnancy and for 6 months after the last dose. You will need a  negative pregnancy test before starting this medication. Contraception is recommended while taking this medication and for 6 months after the last dose. Your care team can help you find an option that works for you. If your partner can get pregnant, use a condom during sex while taking this medication and for 3 months after the last dose. Do not breastfeed while taking this medication and for 2 weeks after the last dose. This medication may cause infertility. Talk to your care team if you are concerned about your fertility. What side effects may I notice from receiving this medication? Side effects that you should report to your care team as soon as possible: Allergic reactions--skin rash, itching, hives, swelling of the face, lips, tongue, or throat Bleeding--bloody or black, tar-like stools, vomiting blood or brown material that looks like coffee grounds, red or dark brown urine, small red or purple spots on skin, unusual bruising or bleeding Blood clot--pain, swelling, or warmth in the leg, shortness of breath, chest pain Dizziness, loss of balance or coordination, confusion or trouble speaking Heart attack--pain or tightness in the chest, shoulders, arms, or jaw, nausea, shortness of breath, cold or clammy skin, feeling faint or lightheaded Heart  failure--shortness of breath, swelling of the ankles, feet, or hands, sudden weight gain, unusual weakness or fatigue Heart rhythm changes--fast or irregular heartbeat, dizziness, feeling faint or lightheaded, chest pain, trouble breathing Increase in blood pressure Infection--fever, chills, cough, sore throat, wounds that don't heal, pain or trouble when passing urine, general feeling of discomfort or being unwell Infusion reactions--chest pain, shortness of breath or trouble breathing, feeling faint or lightheaded Kidney injury--decrease in the amount of urine, swelling of the ankles, hands, or feet Liver injury--right upper belly pain, loss of  appetite, nausea, light-colored stool, dark yellow or brown urine, yellowing skin or eyes, unusual weakness or fatigue Lung injury--shortness of breath or trouble breathing, cough, spitting up blood, chest pain, fever Pulmonary hypertension--shortness of breath, chest pain, fast or irregular heartbeat, feeling faint or lightheaded, fatigue, swelling of the ankles or feet Stomach pain, bloody diarrhea, pale skin, unusual weakness or fatigue, decrease in the amount of urine, which may be signs of hemolytic uremic syndrome Sudden and severe headache, confusion, change in vision, seizures, which may be signs of posterior reversible encephalopathy syndrome (PRES) TTP--purple spots on the skin or inside the mouth, pale skin, yellowing skin or eyes, unusual weakness or fatigue, fever, fast or irregular heartbeat, confusion, change in vision, trouble speaking, trouble walking Tumor lysis syndrome (TLS)--nausea, vomiting, diarrhea, decrease in the amount of urine, dark urine, unusual weakness or fatigue, confusion, muscle pain or cramps, fast or irregular heartbeat, joint pain Side effects that usually do not require medical attention (report to your care team if they continue or are bothersome): Diarrhea Fatigue Nausea Trouble sleeping This list may not describe all possible side effects. Call your doctor for medical advice about side effects. You may report side effects to FDA at 1-800-FDA-1088. Where should I keep my medication? This medication is given in a hospital or clinic. It will not be stored at home. NOTE: This sheet is a summary. It may not cover all possible information. If you have questions about this medicine, talk to your doctor, pharmacist, or health care provider.  2023 Elsevier/Gold Standard (2021-08-03 00:00:00)  Zoledronic Acid Injection (Cancer) What is this medication? ZOLEDRONIC ACID (ZOE le dron ik AS id) treats high calcium levels in the blood caused by cancer. It may also be  used with chemotherapy to treat weakened bones caused by cancer. It works by slowing down the release of calcium from bones. This lowers calcium levels in your blood. It also makes your bones stronger and less likely to break (fracture). It belongs to a group of medications called bisphosphonates. This medicine may be used for other purposes; ask your health care provider or pharmacist if you have questions. COMMON BRAND NAME(S): Zometa, Zometa Powder What should I tell my care team before I take this medication? They need to know if you have any of these conditions: Dehydration Dental disease Kidney disease Liver disease Low levels of calcium in the blood Lung or breathing disease, such as asthma Receiving steroids, such as dexamethasone or prednisone An unusual or allergic reaction to zoledronic acid, other medications, foods, dyes, or preservatives Pregnant or trying to get pregnant Breast-feeding How should I use this medication? This medication is injected into a vein. It is given by your care team in a hospital or clinic setting. Talk to your care team about the use of this medication in children. Special care may be needed. Overdosage: If you think you have taken too much of this medicine contact a poison control center or emergency room  at once. NOTE: This medicine is only for you. Do not share this medicine with others. What if I miss a dose? Keep appointments for follow-up doses. It is important not to miss your dose. Call your care team if you are unable to keep an appointment. What may interact with this medication? Certain antibiotics given by injection Diuretics, such as bumetanide, furosemide NSAIDs, medications for pain and inflammation, such as ibuprofen or naproxen Teriparatide Thalidomide This list may not describe all possible interactions. Give your health care provider a list of all the medicines, herbs, non-prescription drugs, or dietary supplements you use. Also  tell them if you smoke, drink alcohol, or use illegal drugs. Some items may interact with your medicine. What should I watch for while using this medication? Visit your care team for regular checks on your progress. It may be some time before you see the benefit from this medication. Some people who take this medication have severe bone, joint, or muscle pain. This medication may also increase your risk for jaw problems or a broken thigh bone. Tell your care team right away if you have severe pain in your jaw, bones, joints, or muscles. Tell you care team if you have any pain that does not go away or that gets worse. Tell your dentist and dental surgeon that you are taking this medication. You should not have major dental surgery while on this medication. See your dentist to have a dental exam and fix any dental problems before starting this medication. Take good care of your teeth while on this medication. Make sure you see your dentist for regular follow-up appointments. You should make sure you get enough calcium and vitamin D while you are taking this medication. Discuss the foods you eat and the vitamins you take with your care team. Check with your care team if you have severe diarrhea, nausea, and vomiting, or if you sweat a lot. The loss of too much body fluid may make it dangerous for you to take this medication. You may need bloodwork while taking this medication. Talk to your care team if you wish to become pregnant or think you might be pregnant. This medication can cause serious birth defects. What side effects may I notice from receiving this medication? Side effects that you should report to your care team as soon as possible: Allergic reactions--skin rash, itching, hives, swelling of the face, lips, tongue, or throat Kidney injury--decrease in the amount of urine, swelling of the ankles, hands, or feet Low calcium level--muscle pain or cramps, confusion, tingling, or numbness in the  hands or feet Osteonecrosis of the jaw--pain, swelling, or redness in the mouth, numbness of the jaw, poor healing after dental work, unusual discharge from the mouth, visible bones in the mouth Severe bone, joint, or muscle pain Side effects that usually do not require medical attention (report to your care team if they continue or are bothersome): Constipation Fatigue Fever Loss of appetite Nausea Stomach pain This list may not describe all possible side effects. Call your doctor for medical advice about side effects. You may report side effects to FDA at 1-800-FDA-1088. Where should I keep my medication? This medication is given in a hospital or clinic. It will not be stored at home. NOTE: This sheet is a summary. It may not cover all possible information. If you have questions about this medicine, talk to your doctor, pharmacist, or health care provider.  2023 Elsevier/Gold Standard (2021-04-21 00:00:00)

## 2022-01-16 NOTE — Progress Notes (Signed)
Patient seen by Ned Card NP today  Vitals are within treatment parameters.  Labs reviewed by Ned Card NP and are within treatment parameters.  Per physician team, patient is ready for treatment and there are NO modifications to the treatment plan. Pt will also be getting Zometa today.

## 2022-01-17 ENCOUNTER — Other Ambulatory Visit: Payer: Self-pay

## 2022-01-17 ENCOUNTER — Other Ambulatory Visit: Payer: Self-pay | Admitting: Oncology

## 2022-01-18 ENCOUNTER — Other Ambulatory Visit (HOSPITAL_COMMUNITY): Payer: Self-pay

## 2022-01-19 ENCOUNTER — Other Ambulatory Visit (HOSPITAL_COMMUNITY): Payer: Self-pay

## 2022-01-20 ENCOUNTER — Other Ambulatory Visit (HOSPITAL_COMMUNITY): Payer: Self-pay

## 2022-01-23 ENCOUNTER — Inpatient Hospital Stay: Payer: BLUE CROSS/BLUE SHIELD | Attending: Oncology

## 2022-01-23 ENCOUNTER — Inpatient Hospital Stay (HOSPITAL_BASED_OUTPATIENT_CLINIC_OR_DEPARTMENT_OTHER): Payer: BLUE CROSS/BLUE SHIELD | Admitting: Nurse Practitioner

## 2022-01-23 ENCOUNTER — Inpatient Hospital Stay: Payer: BLUE CROSS/BLUE SHIELD

## 2022-01-23 ENCOUNTER — Encounter: Payer: Self-pay | Admitting: Nurse Practitioner

## 2022-01-23 VITALS — BP 128/81 | HR 100 | Temp 98.1°F | Resp 18 | Ht 68.0 in | Wt 176.0 lb

## 2022-01-23 VITALS — BP 123/85 | HR 91

## 2022-01-23 DIAGNOSIS — Z5112 Encounter for antineoplastic immunotherapy: Secondary | ICD-10-CM | POA: Diagnosis present

## 2022-01-23 DIAGNOSIS — C9000 Multiple myeloma not having achieved remission: Secondary | ICD-10-CM | POA: Insufficient documentation

## 2022-01-23 DIAGNOSIS — D649 Anemia, unspecified: Secondary | ICD-10-CM | POA: Diagnosis not present

## 2022-01-23 DIAGNOSIS — Z79899 Other long term (current) drug therapy: Secondary | ICD-10-CM | POA: Diagnosis not present

## 2022-01-23 LAB — CMP (CANCER CENTER ONLY)
ALT: 13 U/L (ref 0–44)
AST: 11 U/L — ABNORMAL LOW (ref 15–41)
Albumin: 4.5 g/dL (ref 3.5–5.0)
Alkaline Phosphatase: 53 U/L (ref 38–126)
Anion gap: 12 (ref 5–15)
BUN: 19 mg/dL (ref 6–20)
CO2: 23 mmol/L (ref 22–32)
Calcium: 10.2 mg/dL (ref 8.9–10.3)
Chloride: 100 mmol/L (ref 98–111)
Creatinine: 1.21 mg/dL (ref 0.61–1.24)
GFR, Estimated: >60 mL/min (ref >60)
Glucose, Bld: 117 mg/dL — ABNORMAL HIGH (ref 70–99)
Potassium: 4 mmol/L (ref 3.5–5.1)
Sodium: 135 mmol/L (ref 135–145)
Total Bilirubin: 0.3 mg/dL (ref 0.3–1.2)
Total Protein: 7.2 g/dL (ref 6.5–8.1)

## 2022-01-23 LAB — PHOSPHORUS: Phosphorus: 2.1 mg/dL — ABNORMAL LOW (ref 2.5–4.6)

## 2022-01-23 LAB — CBC WITH DIFFERENTIAL (CANCER CENTER ONLY)
Abs Immature Granulocytes: 0.06 10*3/uL (ref 0.00–0.07)
Basophils Absolute: 0 10*3/uL (ref 0.0–0.1)
Basophils Relative: 0 %
Eosinophils Absolute: 0.1 10*3/uL (ref 0.0–0.5)
Eosinophils Relative: 1 %
HCT: 41.3 % (ref 39.0–52.0)
Hemoglobin: 14.4 g/dL (ref 13.0–17.0)
Immature Granulocytes: 1 %
Lymphocytes Relative: 5 %
Lymphs Abs: 0.6 10*3/uL — ABNORMAL LOW (ref 0.7–4.0)
MCH: 33.5 pg (ref 26.0–34.0)
MCHC: 34.9 g/dL (ref 30.0–36.0)
MCV: 96 fL (ref 80.0–100.0)
Monocytes Absolute: 0.4 10*3/uL (ref 0.1–1.0)
Monocytes Relative: 3 %
Neutro Abs: 11.4 10*3/uL — ABNORMAL HIGH (ref 1.7–7.7)
Neutrophils Relative %: 90 %
Platelet Count: 135 10*3/uL — ABNORMAL LOW (ref 150–400)
RBC: 4.3 MIL/uL (ref 4.22–5.81)
RDW: 15.5 % (ref 11.5–15.5)
WBC Count: 12.5 10*3/uL — ABNORMAL HIGH (ref 4.0–10.5)
nRBC: 0 % (ref 0.0–0.2)

## 2022-01-23 LAB — URIC ACID: Uric Acid, Serum: 4.4 mg/dL (ref 3.7–8.6)

## 2022-01-23 LAB — LACTATE DEHYDROGENASE: LDH: 118 U/L (ref 98–192)

## 2022-01-23 MED ORDER — DEXTROSE 5 % IV SOLN
56.0000 mg/m2 | Freq: Once | INTRAVENOUS | Status: AC
  Administered 2022-01-23: 110 mg via INTRAVENOUS
  Filled 2022-01-23: qty 15

## 2022-01-23 MED ORDER — DEXAMETHASONE 4 MG PO TABS: 20.0000 mg | ORAL_TABLET | Freq: Once | ORAL | Status: DC

## 2022-01-23 MED ORDER — SODIUM CHLORIDE 0.9 % IV SOLN: Freq: Once | INTRAVENOUS | Status: AC

## 2022-01-23 NOTE — Progress Notes (Signed)
  [facility] OFFICE PROGRESS NOTE   Diagnosis: Multiple myeloma  INTERVAL HISTORY:   Jordan King returns as scheduled.  He resumed venetoclax 100 mg daily 01/19/2022.  He denies a rash.  No nausea or vomiting.  No mouth sores.  No diarrhea.  No bleeding.  Skin abrasions are healing.  Objective:  Vital signs in last 24 hours:  Blood pressure 128/81, pulse 100, temperature 98.1 F (36.7 C), temperature source Oral, resp. rate 18, height _0  (1.727 m), weight 176 lb (79.8 kg), SpO2 100 %.    HEENT: No thrush or ulcers. Resp: Lungs clear bilaterally. Cardio: Regular rate and rhythm. GI: Abdomen soft and nontender.  No hepatosplenomegaly. Vascular: No leg edema. Skin: Multiple abrasions over both forearms and hands are healing.   Lab Results:  Lab Results  Component Value Date   WBC 12.5 (H) 01/23/2022   HGB 14.4 01/23/2022   HCT 41.3 01/23/2022   MCV 96.0 01/23/2022   PLT 135 (L) 01/23/2022   NEUTROABS 11.4 (H) 01/23/2022    Imaging:  No results found.  Medications: I have reviewed the patient's current medications.  Assessment/Plan: Multiple myeloma -05/22/2021-kappa free light chain 11.3, lambda free light chain 17,028 -25 g proteinuria, 20 g lambda light chains -Serum immunofixation 05/22/2021-monoclonal lambda light chains -Bone marrow biopsy 05/23/2021-hypercellular marrow involved by plasma cell myeloma, 80-90% plasma cells on the bone marrow biopsy, diminished iron stains -Cytogenetics-45 X, minus Y, t11:14 (11 q13) -Cycle 1 Velcade/Decadron 05/23/2021 (Decadron given 05/22/2021) -Cycle 1 DRVd 06/13/2021, treatment held 06/20/2021 due to a rash, Revlimid resumed 06/28/2021, daratumumab resumed 07/04/2021 Revlimid resumed 06/28/2021 Cycle 2 daratumumab/Revlimid/Decadron/Velcade 07/18/2021 (Revlimid started 07/19/2021, placed on hold 07/25/2021 secondary to a rash-last taken 07/24/2021) Treatment continued with weekly Velcade/Decadron and daratumumab Treatment changed  to pomalidomide, daratumumab, Decadron 10/03/2021 Pomalidomide placed on hold 10/17/2021 due to a rash Pomalidomide resumed at a reduced dose of 2 mg daily for 21 days followed by 7-day break 10/31/2021 Pomalidomide 2 mg daily for 21 days beginning 11/28/2021 Lambda light chains increased 12/12/2021 Venetoclax/Carfilzomib/Decadron 12/26/2021, venetoclax started 01/05/2022 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 Carfilzomib/Decadron weekly x3 beginning 01/16/2022 Venetoclax resumed 01/19/2022 100 mg daily   Renal failure secondary to #1-improved Hypercalcemia-status post treat with calcitonin, pamidronate 05/22/2021-resolved Lytic bone lesions Anemia Thrombocytopenia Hypocalcemia-IV calcium gluconate 05/30/2021, resolved Rash 06/20/2021,  consistent with a drug rash, recurrent rash 07/24/2021-Revlimid placed on hold; recurrent rash 10/17/2021 Admission 01/09/2022 with renal failure, elevated uric acid, rasburicase 01/09/2022 Wheezing/cough-likely secondary to COPD and asthma  Disposition: Jordan King appears stable.  He began a new cycle of Carfilzomib weekly x3 beginning 01/16/2022.  He resumed venetoclax at a dose of 100 mg daily 01/19/2022.  Thus far he seems to be tolerating treatment well.  Plan to proceed with Carfilzomib today as scheduled.  He will continue venetoclax at the current dose.  CBC and chemistry panel reviewed.  Labs adequate to proceed as above.  He will return for lab and follow-up, treatment in 1 week.  We are available to see him sooner if needed.     Ned Card ANP/GNP-BC   01/23/2022  11:51 AM

## 2022-01-23 NOTE — Patient Instructions (Signed)
Jordan King   Discharge Instructions: Thank you for choosing [facility] to provide your oncology and hematology care.   If you have a lab appointment with the [facility], please go directly to the [facility] and check in at the registration area.   Wear comfortable clothing and clothing appropriate for easy access to any Portacath or PICC line.   We strive to give you quality time with your provider. You may need to reschedule your appointment if you arrive late (15 or more minutes).  Arriving late affects you and other patients whose appointments are after yours.  Also, if you miss three or more appointments without notifying the office, you may be dismissed from the clinic at the provider's discretion.      For prescription refill requests, have your pharmacy contact our office and allow 72 hours for refills to be completed.    Today you received the following chemotherapy and/or immunotherapy agents Kyprolis.       To help prevent nausea and vomiting after your treatment, we encourage you to take your nausea medication as directed.  BELOW ARE SYMPTOMS THAT SHOULD BE REPORTED IMMEDIATELY: *FEVER GREATER THAN 100.4 F (38 C) OR HIGHER *CHILLS OR SWEATING *NAUSEA AND VOMITING THAT IS NOT CONTROLLED WITH YOUR NAUSEA MEDICATION *UNUSUAL SHORTNESS OF BREATH *UNUSUAL BRUISING OR BLEEDING *URINARY PROBLEMS (pain or burning when urinating, or frequent urination) *BOWEL PROBLEMS (unusual diarrhea, constipation, pain near the anus) TENDERNESS IN MOUTH AND THROAT WITH OR WITHOUT PRESENCE OF ULCERS (sore throat, sores in mouth, or a toothache) UNUSUAL RASH, SWELLING OR PAIN  UNUSUAL VAGINAL DISCHARGE OR ITCHING   Items with * indicate a potential emergency and should be followed up as soon as possible or go to the Emergency Department if any problems should occur.  Please show the CHEMOTHERAPY ALERT CARD or IMMUNOTHERAPY ALERT CARD at check-in to  the Emergency Department and triage nurse.  Should you have questions after your visit or need to cancel or reschedule your appointment, please contact [facility]  Dept: [phone]  and follow the prompts.  Office hours are 8:00 a.m. to 4:30 p.m. Monday - Friday. Please note that voicemails left after 4:00 p.m. may not be returned until the following business day.  We are closed weekends and major holidays. You have access to a nurse at all times for urgent questions. Please call the main number to the clinic Dept: [phone] and follow the prompts.   For any non-urgent questions, you may also contact your provider using MyChart. We now offer e-Visits for anyone 68 and older to request care online for non-urgent symptoms. For details visit [email or web address].   Also download the MyChart app! Go to the app store, search "MyChart", open the app, select [facility], and log in with your MyChart username and password.  Masks are optional in the cancer centers. If you would like for your care team to wear a mask while they are taking care of you, please let them know. You may have one support person who is at least 50 years old accompany you for your appointments.  Carfilzomib Injection What is this medication? CARFILZOMIB (kar FILZ oh mib) treats multiple myeloma, a type of bone marrow cancer. It works by blocking a protein that causes cancer cells to grow and multiply. This helps to slow or stop the spread of cancer cells. This medicine may be used for other purposes; ask your health care provider or  pharmacist if you have questions. COMMON BRAND NAME(S): KYPROLIS What should I tell my care team before I take this medication? They need to know if you have any of these conditions: Heart disease History of blood clots Irregular heartbeat Kidney disease Liver disease Lung or breathing disease An unusual or allergic reaction to carfilzomib, or other medications,  foods, dyes, or preservatives If you or your partner are pregnant or trying to get pregnant Breastfeeding How should I use this medication? This medication is injected into a vein. It is given by your care team in a hospital or clinic setting. Talk to your care team about the use of this medication in children. Special care may be needed. Overdosage: If you think you have taken too much of this medicine contact a poison control center or emergency room at once. NOTE: This medicine is only for you. Do not share this medicine with others. What if I miss a dose? Keep appointments for follow-up doses. It is important not to miss your dose. Call your care team if you are unable to keep an appointment. What may interact with this medication? Interactions are not expected. This list may not describe all possible interactions. Give your health care provider a list of all the medicines, herbs, non-prescription drugs, or dietary supplements you use. Also tell them if you smoke, drink alcohol, or use illegal drugs. Some items may interact with your medicine. What should I watch for while using this medication? Your condition will be monitored carefully while you are receiving this medication. You may need blood work while taking this medication. Check with your care team if you have severe diarrhea, nausea, and vomiting, or if you sweat a lot. The loss of too much body fluid may make it dangerous for you to take this medication. This medication may affect your coordination, reaction time, or judgment. Do not drive or operate machinery until you know how this medication affects you. Sit up or stand slowly to reduce the risk of dizzy or fainting spells. Drinking alcohol with this medication can increase the risk of these side effects. Talk to your care team if you may be pregnant. Serious birth defects can occur if you take this medication during pregnancy and for 6 months after the last dose. You will need a  negative pregnancy test before starting this medication. Contraception is recommended while taking this medication and for 6 months after the last dose. Your care team can help you find an option that works for you. If your partner can get pregnant, use a condom during sex while taking this medication and for 3 months after the last dose. Do not breastfeed while taking this medication and for 2 weeks after the last dose. This medication may cause infertility. Talk to your care team if you are concerned about your fertility. What side effects may I notice from receiving this medication? Side effects that you should report to your care team as soon as possible: Allergic reactions--skin rash, itching, hives, swelling of the face, lips, tongue, or throat Bleeding--bloody or black, tar-like stools, vomiting blood or brown material that looks like coffee grounds, red or dark brown urine, small red or purple spots on skin, unusual bruising or bleeding Blood clot--pain, swelling, or warmth in the leg, shortness of breath, chest pain Dizziness, loss of balance or coordination, confusion or trouble speaking Heart attack--pain or tightness in the chest, shoulders, arms, or jaw, nausea, shortness of breath, cold or clammy skin, feeling faint or lightheaded Heart  failure--shortness of breath, swelling of the ankles, feet, or hands, sudden weight gain, unusual weakness or fatigue Heart rhythm changes--fast or irregular heartbeat, dizziness, feeling faint or lightheaded, chest pain, trouble breathing Increase in blood pressure Infection--fever, chills, cough, sore throat, wounds that don't heal, pain or trouble when passing urine, general feeling of discomfort or being unwell Infusion reactions--chest pain, shortness of breath or trouble breathing, feeling faint or lightheaded Kidney injury--decrease in the amount of urine, swelling of the ankles, hands, or feet Liver injury--right upper belly pain, loss of  appetite, nausea, light-colored stool, dark yellow or brown urine, yellowing skin or eyes, unusual weakness or fatigue Lung injury--shortness of breath or trouble breathing, cough, spitting up blood, chest pain, fever Pulmonary hypertension--shortness of breath, chest pain, fast or irregular heartbeat, feeling faint or lightheaded, fatigue, swelling of the ankles or feet Stomach pain, bloody diarrhea, pale skin, unusual weakness or fatigue, decrease in the amount of urine, which may be signs of hemolytic uremic syndrome Sudden and severe headache, confusion, change in vision, seizures, which may be signs of posterior reversible encephalopathy syndrome (PRES) TTP--purple spots on the skin or inside the mouth, pale skin, yellowing skin or eyes, unusual weakness or fatigue, fever, fast or irregular heartbeat, confusion, change in vision, trouble speaking, trouble walking Tumor lysis syndrome (TLS)--nausea, vomiting, diarrhea, decrease in the amount of urine, dark urine, unusual weakness or fatigue, confusion, muscle pain or cramps, fast or irregular heartbeat, joint pain Side effects that usually do not require medical attention (report to your care team if they continue or are bothersome): Diarrhea Fatigue Nausea Trouble sleeping This list may not describe all possible side effects. Call your doctor for medical advice about side effects. You may report side effects to FDA at 1-800-FDA-1088. Where should I keep my medication? This medication is given in a hospital or clinic. It will not be stored at home. NOTE: This sheet is a summary. It may not cover all possible information. If you have questions about this medicine, talk to your doctor, pharmacist, or health care provider.  2023 Elsevier/Gold Standard (2021-08-03 00:00:00)  Zoledronic Acid Injection (Cancer) What is this medication? ZOLEDRONIC ACID (ZOE le dron ik AS id) treats high calcium levels in the blood caused by cancer. It may also be  used with chemotherapy to treat weakened bones caused by cancer. It works by slowing down the release of calcium from bones. This lowers calcium levels in your blood. It also makes your bones stronger and less likely to break (fracture). It belongs to a group of medications called bisphosphonates. This medicine may be used for other purposes; ask your health care provider or pharmacist if you have questions. COMMON BRAND NAME(S): Zometa, Zometa Powder What should I tell my care team before I take this medication? They need to know if you have any of these conditions: Dehydration Dental disease Kidney disease Liver disease Low levels of calcium in the blood Lung or breathing disease, such as asthma Receiving steroids, such as dexamethasone or prednisone An unusual or allergic reaction to zoledronic acid, other medications, foods, dyes, or preservatives Pregnant or trying to get pregnant Breast-feeding How should I use this medication? This medication is injected into a vein. It is given by your care team in a hospital or clinic setting. Talk to your care team about the use of this medication in children. Special care may be needed. Overdosage: If you think you have taken too much of this medicine contact a poison control center or emergency room  at once. NOTE: This medicine is only for you. Do not share this medicine with others. What if I miss a dose? Keep appointments for follow-up doses. It is important not to miss your dose. Call your care team if you are unable to keep an appointment. What may interact with this medication? Certain antibiotics given by injection Diuretics, such as bumetanide, furosemide NSAIDs, medications for pain and inflammation, such as ibuprofen or naproxen Teriparatide Thalidomide This list may not describe all possible interactions. Give your health care provider a list of all the medicines, herbs, non-prescription drugs, or dietary supplements you use. Also  tell them if you smoke, drink alcohol, or use illegal drugs. Some items may interact with your medicine. What should I watch for while using this medication? Visit your care team for regular checks on your progress. It may be some time before you see the benefit from this medication. Some people who take this medication have severe bone, joint, or muscle pain. This medication may also increase your risk for jaw problems or a broken thigh bone. Tell your care team right away if you have severe pain in your jaw, bones, joints, or muscles. Tell you care team if you have any pain that does not go away or that gets worse. Tell your dentist and dental surgeon that you are taking this medication. You should not have major dental surgery while on this medication. See your dentist to have a dental exam and fix any dental problems before starting this medication. Take good care of your teeth while on this medication. Make sure you see your dentist for regular follow-up appointments. You should make sure you get enough calcium and vitamin D while you are taking this medication. Discuss the foods you eat and the vitamins you take with your care team. Check with your care team if you have severe diarrhea, nausea, and vomiting, or if you sweat a lot. The loss of too much body fluid may make it dangerous for you to take this medication. You may need bloodwork while taking this medication. Talk to your care team if you wish to become pregnant or think you might be pregnant. This medication can cause serious birth defects. What side effects may I notice from receiving this medication? Side effects that you should report to your care team as soon as possible: Allergic reactions--skin rash, itching, hives, swelling of the face, lips, tongue, or throat Kidney injury--decrease in the amount of urine, swelling of the ankles, hands, or feet Low calcium level--muscle pain or cramps, confusion, tingling, or numbness in the  hands or feet Osteonecrosis of the jaw--pain, swelling, or redness in the mouth, numbness of the jaw, poor healing after dental work, unusual discharge from the mouth, visible bones in the mouth Severe bone, joint, or muscle pain Side effects that usually do not require medical attention (report to your care team if they continue or are bothersome): Constipation Fatigue Fever Loss of appetite Nausea Stomach pain This list may not describe all possible side effects. Call your doctor for medical advice about side effects. You may report side effects to FDA at 1-800-FDA-1088. Where should I keep my medication? This medication is given in a hospital or clinic. It will not be stored at home. NOTE: This sheet is a summary. It may not cover all possible information. If you have questions about this medicine, talk to your doctor, pharmacist, or health care provider.  2023 Elsevier/Gold Standard (2021-04-21 00:00:00)

## 2022-01-23 NOTE — Progress Notes (Signed)
Patient seen by Lisa Thomas NP today  Vitals are within treatment parameters.  Labs reviewed by Lisa Thomas NP and are within treatment parameters.  Per physician team, patient is ready for treatment and there are NO modifications to the treatment plan.     

## 2022-01-24 ENCOUNTER — Other Ambulatory Visit: Payer: Self-pay

## 2022-01-24 LAB — KAPPA/LAMBDA LIGHT CHAINS
Kappa free light chain: 5.8 mg/L (ref 3.3–19.4)
Kappa, lambda light chain ratio: 1.76 — ABNORMAL HIGH (ref 0.26–1.65)
Lambda free light chains: 3.3 mg/L — ABNORMAL LOW (ref 5.7–26.3)

## 2022-01-25 ENCOUNTER — Telehealth: Payer: Self-pay

## 2022-01-25 NOTE — Telephone Encounter (Signed)
Patient gave verbal understanding and had no further questions or concerns at this time 

## 2022-01-25 NOTE — Telephone Encounter (Signed)
-----   Message from [name], MD sent at 01/24/2022  7:51 PM EST ----- Please call patient,light chains are much improved, continue current treatment

## 2022-01-26 ENCOUNTER — Other Ambulatory Visit: Payer: Self-pay

## 2022-01-29 ENCOUNTER — Other Ambulatory Visit: Payer: Self-pay | Admitting: Oncology

## 2022-01-29 DIAGNOSIS — C9000 Multiple myeloma not having achieved remission: Secondary | ICD-10-CM

## 2022-01-30 ENCOUNTER — Inpatient Hospital Stay: Payer: BLUE CROSS/BLUE SHIELD

## 2022-01-30 ENCOUNTER — Encounter: Payer: Self-pay | Admitting: *Deleted

## 2022-01-30 ENCOUNTER — Inpatient Hospital Stay (HOSPITAL_BASED_OUTPATIENT_CLINIC_OR_DEPARTMENT_OTHER): Payer: BLUE CROSS/BLUE SHIELD | Admitting: Oncology

## 2022-01-30 VITALS — BP 123/86 | HR 72

## 2022-01-30 VITALS — BP 134/85 | HR 83 | Temp 98.2°F | Resp 18 | Ht 68.0 in | Wt 177.6 lb

## 2022-01-30 DIAGNOSIS — C9000 Multiple myeloma not having achieved remission: Secondary | ICD-10-CM | POA: Diagnosis not present

## 2022-01-30 LAB — CBC WITH DIFFERENTIAL (CANCER CENTER ONLY)
Abs Immature Granulocytes: 0.03 10*3/uL (ref 0.00–0.07)
Basophils Absolute: 0 10*3/uL (ref 0.0–0.1)
Basophils Relative: 0 %
Eosinophils Absolute: 0.2 10*3/uL (ref 0.0–0.5)
Eosinophils Relative: 2 %
HCT: 39.8 % (ref 39.0–52.0)
Hemoglobin: 13.9 g/dL (ref 13.0–17.0)
Immature Granulocytes: 0 %
Lymphocytes Relative: 19 %
Lymphs Abs: 1.8 10*3/uL (ref 0.7–4.0)
MCH: 34.2 pg — ABNORMAL HIGH (ref 26.0–34.0)
MCHC: 34.9 g/dL (ref 30.0–36.0)
MCV: 97.8 fL (ref 80.0–100.0)
Monocytes Absolute: 0.7 10*3/uL (ref 0.1–1.0)
Monocytes Relative: 7 %
Neutro Abs: 6.7 10*3/uL (ref 1.7–7.7)
Neutrophils Relative %: 72 %
Platelet Count: 142 10*3/uL — ABNORMAL LOW (ref 150–400)
RBC: 4.07 MIL/uL — ABNORMAL LOW (ref 4.22–5.81)
RDW: 15.6 % — ABNORMAL HIGH (ref 11.5–15.5)
WBC Count: 9.4 10*3/uL (ref 4.0–10.5)
nRBC: 0 % (ref 0.0–0.2)

## 2022-01-30 LAB — CMP (CANCER CENTER ONLY)
ALT: 11 U/L (ref 0–44)
AST: 9 U/L — ABNORMAL LOW (ref 15–41)
Albumin: 4.3 g/dL (ref 3.5–5.0)
Alkaline Phosphatase: 58 U/L (ref 38–126)
Anion gap: 12 (ref 5–15)
BUN: 21 mg/dL — ABNORMAL HIGH (ref 6–20)
CO2: 23 mmol/L (ref 22–32)
Calcium: 9.7 mg/dL (ref 8.9–10.3)
Chloride: 101 mmol/L (ref 98–111)
Creatinine: 1.09 mg/dL (ref 0.61–1.24)
GFR, Estimated: >60 mL/min (ref >60)
Glucose, Bld: 130 mg/dL — ABNORMAL HIGH (ref 70–99)
Potassium: 3.7 mmol/L (ref 3.5–5.1)
Sodium: 136 mmol/L (ref 135–145)
Total Bilirubin: 0.3 mg/dL (ref 0.3–1.2)
Total Protein: 6.8 g/dL (ref 6.5–8.1)

## 2022-01-30 LAB — URIC ACID: Uric Acid, Serum: 5.2 mg/dL (ref 3.7–8.6)

## 2022-01-30 LAB — PHOSPHORUS: Phosphorus: 3 mg/dL (ref 2.5–4.6)

## 2022-01-30 LAB — LACTATE DEHYDROGENASE: LDH: 114 U/L (ref 98–192)

## 2022-01-30 MED ORDER — DEXAMETHASONE 4 MG PO TABS: 20.0000 mg | ORAL_TABLET | Freq: Once | ORAL | Status: DC

## 2022-01-30 MED ORDER — SODIUM CHLORIDE 0.9 % IV SOLN: Freq: Once | INTRAVENOUS | Status: AC

## 2022-01-30 MED ORDER — DEXTROSE 5 % IV SOLN
56.0000 mg/m2 | Freq: Once | INTRAVENOUS | Status: AC
  Administered 2022-01-30: 110 mg via INTRAVENOUS
  Filled 2022-01-30: qty 10

## 2022-01-30 NOTE — Progress Notes (Signed)
Patient seen by Dr. Sherrill today ? ?Vitals are within treatment parameters. ? ?Labs reviewed by Dr. Sherrill and are within treatment parameters. ? ?Per physician team, patient is ready for treatment and there are NO modifications to the treatment plan.  ?

## 2022-01-30 NOTE — Patient Instructions (Signed)
[facility]   Discharge Instructions: Thank you for choosing [facility] to provide your oncology and hematology care.   If you have a lab appointment with the Cancer Center, please go directly to the Cancer Center and check in at the registration area.   Wear comfortable clothing and clothing appropriate for easy access to any Portacath or PICC line.   We strive to give you quality time with your provider. You may need to reschedule your appointment if you arrive late (15 or more minutes).  Arriving late affects you and other patients whose appointments are after yours.  Also, if you miss three or more appointments without notifying the office, you may be dismissed from the clinic at the provider's discretion.      For prescription refill requests, have your pharmacy contact our office and allow 72 hours for refills to be completed.    Today you received the following chemotherapy and/or immunotherapy agents Kyprolis.      To help prevent nausea and vomiting after your treatment, we encourage you to take your nausea medication as directed.  BELOW ARE SYMPTOMS THAT SHOULD BE REPORTED IMMEDIATELY: *FEVER GREATER THAN 100.4 F (38 C) OR HIGHER *CHILLS OR SWEATING *NAUSEA AND VOMITING THAT IS NOT CONTROLLED WITH YOUR NAUSEA MEDICATION *UNUSUAL SHORTNESS OF BREATH *UNUSUAL BRUISING OR BLEEDING *URINARY PROBLEMS (pain or burning when urinating, or frequent urination) *BOWEL PROBLEMS (unusual diarrhea, constipation, pain near the anus) TENDERNESS IN MOUTH AND THROAT WITH OR WITHOUT PRESENCE OF ULCERS (sore throat, sores in mouth, or a toothache) UNUSUAL RASH, SWELLING OR PAIN  UNUSUAL VAGINAL DISCHARGE OR ITCHING   Items with * indicate a potential emergency and should be followed up as soon as possible or go to the Emergency Department if any problems should occur.  Please show the CHEMOTHERAPY ALERT CARD or IMMUNOTHERAPY ALERT CARD at check-in to  the Emergency Department and triage nurse.  Should you have questions after your visit or need to cancel or reschedule your appointment, please contact [facility]  Dept: [phone]  and follow the prompts.  Office hours are 8:00 a.m. to 4:30 p.m. Monday - Friday. Please note that voicemails left after 4:00 p.m. may not be returned until the following business day.  We are closed weekends and major holidays. You have access to a nurse at all times for urgent questions. Please call the main number to the clinic Dept: [phone] and follow the prompts.   For any non-urgent questions, you may also contact your provider using MyChart. We now offer e-Visits for anyone 18 and older to request care online for non-urgent symptoms. For details visit [email or web address].   Also download the MyChart app! Go to the app store, search "MyChart", open the app, select , and log in with your MyChart username and password.  Masks are optional in the cancer centers. If you would like for your care team to wear a mask while they are taking care of you, please let them know. You may have one support person who is at least 50 years old accompany you for your appointments.  Carfilzomib Injection What is this medication? CARFILZOMIB (kar FILZ oh mib) treats multiple myeloma, a type of bone marrow cancer. It works by blocking a protein that causes cancer cells to grow and multiply. This helps to slow or stop the spread of cancer cells. This medicine may be used for other purposes; ask your health care provider or pharmacist   if you have questions. COMMON BRAND NAME(S): KYPROLIS What should I tell my care team before I take this medication? They need to know if you have any of these conditions: Heart disease History of blood clots Irregular heartbeat Kidney disease Liver disease Lung or breathing disease An unusual or allergic reaction to carfilzomib, or other medications,  foods, dyes, or preservatives If you or your partner are pregnant or trying to get pregnant Breastfeeding How should I use this medication? This medication is injected into a vein. It is given by your care team in a hospital or clinic setting. Talk to your care team about the use of this medication in children. Special care may be needed. Overdosage: If you think you have taken too much of this medicine contact a poison control center or emergency room at once. NOTE: This medicine is only for you. Do not share this medicine with others. What if I miss a dose? Keep appointments for follow-up doses. It is important not to miss your dose. Call your care team if you are unable to keep an appointment. What may interact with this medication? Interactions are not expected. This list may not describe all possible interactions. Give your health care provider a list of all the medicines, herbs, non-prescription drugs, or dietary supplements you use. Also tell them if you smoke, drink alcohol, or use illegal drugs. Some items may interact with your medicine. What should I watch for while using this medication? Your condition will be monitored carefully while you are receiving this medication. You may need blood work while taking this medication. Check with your care team if you have severe diarrhea, nausea, and vomiting, or if you sweat a lot. The loss of too much body fluid may make it dangerous for you to take this medication. This medication may affect your coordination, reaction time, or judgment. Do not drive or operate machinery until you know how this medication affects you. Sit up or stand slowly to reduce the risk of dizzy or fainting spells. Drinking alcohol with this medication can increase the risk of these side effects. Talk to your care team if you may be pregnant. Serious birth defects can occur if you take this medication during pregnancy and for 6 months after the last dose. You will need a  negative pregnancy test before starting this medication. Contraception is recommended while taking this medication and for 6 months after the last dose. Your care team can help you find an option that works for you. If your partner can get pregnant, use a condom during sex while taking this medication and for 3 months after the last dose. Do not breastfeed while taking this medication and for 2 weeks after the last dose. This medication may cause infertility. Talk to your care team if you are concerned about your fertility. What side effects may I notice from receiving this medication? Side effects that you should report to your care team as soon as possible: Allergic reactions--skin rash, itching, hives, swelling of the face, lips, tongue, or throat Bleeding--bloody or black, tar-like stools, vomiting blood or brown material that looks like coffee grounds, red or dark brown urine, small red or purple spots on skin, unusual bruising or bleeding Blood clot--pain, swelling, or warmth in the leg, shortness of breath, chest pain Dizziness, loss of balance or coordination, confusion or trouble speaking Heart attack--pain or tightness in the chest, shoulders, arms, or jaw, nausea, shortness of breath, cold or clammy skin, feeling faint or lightheaded Heart failure--shortness  of breath, swelling of the ankles, feet, or hands, sudden weight gain, unusual weakness or fatigue Heart rhythm changes--fast or irregular heartbeat, dizziness, feeling faint or lightheaded, chest pain, trouble breathing Increase in blood pressure Infection--fever, chills, cough, sore throat, wounds that don't heal, pain or trouble when passing urine, general feeling of discomfort or being unwell Infusion reactions--chest pain, shortness of breath or trouble breathing, feeling faint or lightheaded Kidney injury--decrease in the amount of urine, swelling of the ankles, hands, or feet Liver injury--right upper belly pain, loss of  appetite, nausea, light-colored stool, dark yellow or brown urine, yellowing skin or eyes, unusual weakness or fatigue Lung injury--shortness of breath or trouble breathing, cough, spitting up blood, chest pain, fever Pulmonary hypertension--shortness of breath, chest pain, fast or irregular heartbeat, feeling faint or lightheaded, fatigue, swelling of the ankles or feet Stomach pain, bloody diarrhea, pale skin, unusual weakness or fatigue, decrease in the amount of urine, which may be signs of hemolytic uremic syndrome Sudden and severe headache, confusion, change in vision, seizures, which may be signs of posterior reversible encephalopathy syndrome (PRES) TTP--purple spots on the skin or inside the mouth, pale skin, yellowing skin or eyes, unusual weakness or fatigue, fever, fast or irregular heartbeat, confusion, change in vision, trouble speaking, trouble walking Tumor lysis syndrome (TLS)--nausea, vomiting, diarrhea, decrease in the amount of urine, dark urine, unusual weakness or fatigue, confusion, muscle pain or cramps, fast or irregular heartbeat, joint pain Side effects that usually do not require medical attention (report to your care team if they continue or are bothersome): Diarrhea Fatigue Nausea Trouble sleeping This list may not describe all possible side effects. Call your doctor for medical advice about side effects. You may report side effects to FDA at 1-800-FDA-1088. Where should I keep my medication? This medication is given in a hospital or clinic. It will not be stored at home. NOTE: This sheet is a summary. It may not cover all possible information. If you have questions about this medicine, talk to your doctor, pharmacist, or health care provider.  2023 Elsevier/Gold Standard (2021-08-03 00:00:00)

## 2022-01-30 NOTE — Progress Notes (Signed)
[place] OFFICE PROGRESS NOTE   Diagnosis: Multiple myeloma  INTERVAL HISTORY:   Jordan King returns as scheduled.  He continues venetoclax.  No rash.  No peripheral numbness.  No new complaints.  Objective:  Vital signs in last 24 hours:  Blood pressure 134/85, pulse 83, temperature 98.2 F (36.8 C), temperature source Oral, resp. rate 18, height _0  (1.727 m), weight 177 lb 9.6 oz (80.6 kg), SpO2 100 %.    HEENT: No thrush Resp: Lungs clear bilaterally Cardio: Regular rate and rhythm GI: No hepatosplenomegaly Vascular: No leg edema  Skin: No rash   Lab Results:  Lab Results  Component Value Date   WBC 9.4 01/30/2022   HGB 13.9 01/30/2022   HCT 39.8 01/30/2022   MCV 97.8 01/30/2022   PLT 142 (L) 01/30/2022   NEUTROABS 6.7 01/30/2022    CMP  Lab Results  Component Value Date   NA 135 01/23/2022   K 4.0 01/23/2022   CL 100 01/23/2022   CO2 23 01/23/2022   GLUCOSE 117 (H) 01/23/2022   BUN 19 01/23/2022   CREATININE 1.21 01/23/2022   CALCIUM 10.2 01/23/2022   PROT 7.2 01/23/2022   ALBUMIN 4.5 01/23/2022   AST 11 (L) 01/23/2022   ALT 13 01/23/2022   ALKPHOS 53 01/23/2022   BILITOT 0.3 01/23/2022   GFRNONAA >60 01/23/2022    No results found for: "CEA1", "CEA", "CAN199", "CA125"  Lab Results  Component Value Date   INR 1.1 05/22/2021   LABPROT 14.4 05/22/2021    Imaging:  No results found.  Medications: I have reviewed the patient's current medications.   Assessment/Plan: Multiple myeloma -05/22/2021-kappa free light chain 11.3, lambda free light chain 17,028 -25 g proteinuria, 20 g lambda light chains -Serum immunofixation 05/22/2021-monoclonal lambda light chains -Bone marrow biopsy 05/23/2021-hypercellular marrow involved by plasma cell myeloma, 80-90% plasma cells on the bone marrow biopsy, diminished iron stains -Cytogenetics-45 X, minus Y, t11:14 (11 q13) -Cycle 1 Velcade/Decadron 05/23/2021 (Decadron given 05/22/2021) -Cycle  1 DRVd 06/13/2021, treatment held 06/20/2021 due to a rash, Revlimid resumed 06/28/2021, daratumumab resumed 07/04/2021 Revlimid resumed 06/28/2021 Cycle 2 daratumumab/Revlimid/Decadron/Velcade 07/18/2021 (Revlimid started 07/19/2021, placed on hold 07/25/2021 secondary to a rash-last taken 07/24/2021) Treatment continued with weekly Velcade/Decadron and daratumumab Treatment changed to pomalidomide, daratumumab, Decadron 10/03/2021 Pomalidomide placed on hold 10/17/2021 due to a rash Pomalidomide resumed at a reduced dose of 2 mg daily for 21 days followed by 7-day break 10/31/2021 Pomalidomide 2 mg daily for 21 days beginning 11/28/2021 Lambda light chains increased 12/12/2021 Venetoclax/Carfilzomib/Decadron 12/26/2021, venetoclax started 01/05/2022 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 Carfilzomib/Decadron weekly x3 beginning 01/16/2022 Venetoclax resumed 01/19/2022 100 mg daily   Renal failure secondary to #1-improved Hypercalcemia-status post treat with calcitonin, pamidronate 05/22/2021-resolved Lytic bone lesions Anemia Thrombocytopenia Hypocalcemia-IV calcium gluconate 05/30/2021, resolved Rash 06/20/2021,  consistent with a drug rash, recurrent rash 07/24/2021-Revlimid placed on hold; recurrent rash 10/17/2021 Admission 01/09/2022 with renal failure, elevated uric acid, rasburicase 01/09/2022 Wheezing/cough-likely secondary to COPD and asthma    Disposition: Jordan King appears well.  He will complete treatment with carfilzomib today.  This will complete a 3-week cycle.  He continues daily venetoclax.  The serum light chains free lambda light chains were low on 01/23/2022.  The plan is to continue carfilzomib, Decadron, and venetoclax.  There is no evidence of tumor lysis syndrome.  He will return for an office visit and carfilzomib in 2 weeks.    Betsy Coder, MD  01/30/2022  9:25 AM

## 2022-01-31 ENCOUNTER — Encounter: Payer: Self-pay | Admitting: Oncology

## 2022-01-31 ENCOUNTER — Telehealth: Payer: Self-pay | Admitting: *Deleted

## 2022-01-31 ENCOUNTER — Other Ambulatory Visit: Payer: Self-pay

## 2022-01-31 ENCOUNTER — Other Ambulatory Visit (HOSPITAL_COMMUNITY): Payer: Self-pay

## 2022-01-31 LAB — KAPPA/LAMBDA LIGHT CHAINS
Kappa free light chain: 5 mg/L (ref 3.3–19.4)
Kappa, lambda light chain ratio: 1.56 (ref 0.26–1.65)
Lambda free light chains: 3.2 mg/L — ABNORMAL LOW (ref 5.7–26.3)

## 2022-01-31 NOTE — Telephone Encounter (Signed)
Attempted to return call of [facility] case manager to provide treatment plan information. Left VM with return call number and fax number. Per Dr. Benay Spice: Continue carfilzomib/venetoclax/dexamtthasone Continue Zometa every 12 weeks F/U with Dr. Feliciana Rossetti at [facility] on 11/30. Currently he has declined transplant.

## 2022-02-06 ENCOUNTER — Other Ambulatory Visit (HOSPITAL_COMMUNITY): Payer: Self-pay

## 2022-02-10 ENCOUNTER — Other Ambulatory Visit (HOSPITAL_COMMUNITY): Payer: Self-pay

## 2022-02-11 ENCOUNTER — Other Ambulatory Visit: Payer: Self-pay | Admitting: Oncology

## 2022-02-11 DIAGNOSIS — C9000 Multiple myeloma not having achieved remission: Secondary | ICD-10-CM

## 2022-02-13 ENCOUNTER — Inpatient Hospital Stay: Payer: BLUE CROSS/BLUE SHIELD

## 2022-02-13 ENCOUNTER — Encounter: Payer: Self-pay | Admitting: Nurse Practitioner

## 2022-02-13 ENCOUNTER — Inpatient Hospital Stay (HOSPITAL_BASED_OUTPATIENT_CLINIC_OR_DEPARTMENT_OTHER): Payer: BLUE CROSS/BLUE SHIELD | Admitting: Nurse Practitioner

## 2022-02-13 VITALS — BP 123/85 | HR 70 | Temp 98.2°F | Resp 18 | Ht 68.0 in | Wt 176.0 lb

## 2022-02-13 DIAGNOSIS — C9000 Multiple myeloma not having achieved remission: Secondary | ICD-10-CM | POA: Diagnosis not present

## 2022-02-13 LAB — CMP (CANCER CENTER ONLY)
ALT: 9 U/L (ref 0–44)
AST: 9 U/L — ABNORMAL LOW (ref 15–41)
Albumin: 4.3 g/dL (ref 3.5–5.0)
Alkaline Phosphatase: 63 U/L (ref 38–126)
Anion gap: 13 (ref 5–15)
BUN: 17 mg/dL (ref 6–20)
CO2: 21 mmol/L — ABNORMAL LOW (ref 22–32)
Calcium: 9.6 mg/dL (ref 8.9–10.3)
Chloride: 101 mmol/L (ref 98–111)
Creatinine: 1.1 mg/dL (ref 0.61–1.24)
GFR, Estimated: >60 mL/min (ref >60)
Glucose, Bld: 123 mg/dL — ABNORMAL HIGH (ref 70–99)
Potassium: 3.9 mmol/L (ref 3.5–5.1)
Sodium: 135 mmol/L (ref 135–145)
Total Bilirubin: 0.3 mg/dL (ref 0.3–1.2)
Total Protein: 7.1 g/dL (ref 6.5–8.1)

## 2022-02-13 LAB — CBC WITH DIFFERENTIAL (CANCER CENTER ONLY)
Abs Immature Granulocytes: 0.09 10*3/uL — ABNORMAL HIGH (ref 0.00–0.07)
Basophils Absolute: 0.1 10*3/uL (ref 0.0–0.1)
Basophils Relative: 0 %
Eosinophils Absolute: 0 10*3/uL (ref 0.0–0.5)
Eosinophils Relative: 0 %
HCT: 41.4 % (ref 39.0–52.0)
Hemoglobin: 14.5 g/dL (ref 13.0–17.0)
Immature Granulocytes: 1 %
Lymphocytes Relative: 4 %
Lymphs Abs: 0.6 10*3/uL — ABNORMAL LOW (ref 0.7–4.0)
MCH: 34.1 pg — ABNORMAL HIGH (ref 26.0–34.0)
MCHC: 35 g/dL (ref 30.0–36.0)
MCV: 97.4 fL (ref 80.0–100.0)
Monocytes Absolute: 0.6 10*3/uL (ref 0.1–1.0)
Monocytes Relative: 4 %
Neutro Abs: 12.9 10*3/uL — ABNORMAL HIGH (ref 1.7–7.7)
Neutrophils Relative %: 91 %
Platelet Count: 263 10*3/uL (ref 150–400)
RBC: 4.25 MIL/uL (ref 4.22–5.81)
RDW: 15.3 % (ref 11.5–15.5)
WBC Count: 14.2 10*3/uL — ABNORMAL HIGH (ref 4.0–10.5)
nRBC: 0 % (ref 0.0–0.2)

## 2022-02-13 LAB — URIC ACID: Uric Acid, Serum: 6.3 mg/dL (ref 3.7–8.6)

## 2022-02-13 LAB — PHOSPHORUS: Phosphorus: 1.6 mg/dL — ABNORMAL LOW (ref 2.5–4.6)

## 2022-02-13 MED ORDER — DEXAMETHASONE 4 MG PO TABS: 20.0000 mg | ORAL_TABLET | Freq: Once | ORAL | Status: DC

## 2022-02-13 MED ORDER — DEXTROSE 5 % IV SOLN
56.0000 mg/m2 | Freq: Once | INTRAVENOUS | Status: AC
  Administered 2022-02-13: 110 mg via INTRAVENOUS
  Filled 2022-02-13: qty 10

## 2022-02-13 MED ORDER — SODIUM CHLORIDE 0.9 % IV SOLN: Freq: Once | INTRAVENOUS | Status: AC

## 2022-02-13 MED ORDER — SODIUM CHLORIDE 0.9 % IV SOLN: Freq: Once | INTRAVENOUS | Status: DC

## 2022-02-13 NOTE — Progress Notes (Signed)
  [place] OFFICE PROGRESS NOTE   Diagnosis: Multiple myeloma  INTERVAL HISTORY:   Mr. Camargo returns as scheduled.  He continues venetoclax.  He completed cycle 1 Carfilzomib weekly x 3 on 01/30/2022.  He denies nausea/vomiting.  No mouth sores.  No diarrhea.  Intermittent constipation.  No rash.  He notes numbness/tingling in the 2 fingertips that were injured on his left hand.  No numbness/tingling in other fingertips or toes.  Objective:  Vital signs in last 24 hours:  Blood pressure 123/85, pulse 70, temperature 98.2 F (36.8 C), temperature source Oral, resp. rate 18, height _0  (1.727 m), weight 176 lb (79.8 kg), SpO2 100 %.    HEENT: No thrush or ulcers. Resp: Lungs clear bilaterally. Cardio: Regular rate and rhythm. GI: Abdomen soft and nontender.  No hepatosplenomegaly. Vascular: No leg edema. Neuro: Alert and oriented. Skin: No rash.   Lab Results:  Lab Results  Component Value Date   WBC 14.2 (H) 02/13/2022   HGB 14.5 02/13/2022   HCT 41.4 02/13/2022   MCV 97.4 02/13/2022   PLT 263 02/13/2022   NEUTROABS 12.9 (H) 02/13/2022    Imaging:  No results found.  Medications: I have reviewed the patient's current medications.  Assessment/Plan: Multiple myeloma -05/22/2021-kappa free light chain 11.3, lambda free light chain 17,028 -25 g proteinuria, 20 g lambda light chains -Serum immunofixation 05/22/2021-monoclonal lambda light chains -Bone marrow biopsy 05/23/2021-hypercellular marrow involved by plasma cell myeloma, 80-90% plasma cells on the bone marrow biopsy, diminished iron stains -Cytogenetics-45 X, minus Y, t11:14 (11 q13) -Cycle 1 Velcade/Decadron 05/23/2021 (Decadron given 05/22/2021) -Cycle 1 DRVd 06/13/2021, treatment held 06/20/2021 due to a rash, Revlimid resumed 06/28/2021, daratumumab resumed 07/04/2021 Revlimid resumed 06/28/2021 Cycle 2 daratumumab/Revlimid/Decadron/Velcade 07/18/2021 (Revlimid started 07/19/2021, placed on hold 07/25/2021  secondary to a rash-last taken 07/24/2021) Treatment continued with weekly Velcade/Decadron and daratumumab Treatment changed to pomalidomide, daratumumab, Decadron 10/03/2021 Pomalidomide placed on hold 10/17/2021 due to a rash Pomalidomide resumed at a reduced dose of 2 mg daily for 21 days followed by 7-day break 10/31/2021 Pomalidomide 2 mg daily for 21 days beginning 11/28/2021 Lambda light chains increased 12/12/2021 Venetoclax/Carfilzomib/Decadron 12/26/2021, venetoclax started 01/05/2022 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 Carfilzomib/Decadron weekly x3 beginning 01/16/2022 Venetoclax resumed 01/19/2022 100 mg daily Carfilzomib/Decadron weekly x 3 beginning 02/13/2022   Renal failure secondary to #1-improved Hypercalcemia-status post treat with calcitonin, pamidronate 05/22/2021-resolved Lytic bone lesions Anemia Thrombocytopenia Hypocalcemia-IV calcium gluconate 05/30/2021, resolved Rash 06/20/2021,  consistent with a drug rash, recurrent rash 07/24/2021-Revlimid placed on hold; recurrent rash 10/17/2021 Admission 01/09/2022 with renal failure, elevated uric acid, rasburicase 01/09/2022 Wheezing/cough-likely secondary to COPD and asthma  Disposition: Mr. Campion appears stable.  He continues venetoclax.  He will begin another cycle of carfilzomib/Decadron today.  Serum light chains were markedly improved 01/30/2022.  He has follow-up with Dr. Feliciana Rossetti at [facility] later this week.  CBC and chemistry panel reviewed.  Labs adequate to proceed as above.  We will see him in follow-up in 2 weeks.  He will contact the office in the interim with any problems.  Ned Card ANP/GNP-BC   02/13/2022  10:34 AM

## 2022-02-14 ENCOUNTER — Other Ambulatory Visit: Payer: Self-pay | Admitting: Nurse Practitioner

## 2022-02-14 ENCOUNTER — Telehealth: Payer: Self-pay

## 2022-02-14 ENCOUNTER — Other Ambulatory Visit: Payer: Self-pay

## 2022-02-14 ENCOUNTER — Other Ambulatory Visit (HOSPITAL_COMMUNITY): Payer: Self-pay

## 2022-02-14 DIAGNOSIS — C9000 Multiple myeloma not having achieved remission: Secondary | ICD-10-CM

## 2022-02-14 MED ORDER — K-PHOS 500 MG PO TABS: 500.0000 mg | ORAL_TABLET | Freq: Three times a day (TID) | ORAL | 0 refills | Status: AC

## 2022-02-14 NOTE — Telephone Encounter (Signed)
Patient gave verbal understanding had no further questions or concerns. 

## 2022-02-14 NOTE — Telephone Encounter (Signed)
-----   Message from Owens Shark, NP sent at 02/14/2022 10:15 AM EST ----- Please let him know phosphorus level is low.  I am sending a prescription to his pharmacy for a phosphorus supplement.  Thanks

## 2022-02-15 ENCOUNTER — Other Ambulatory Visit: Payer: Self-pay | Admitting: Nurse Practitioner

## 2022-02-15 ENCOUNTER — Encounter: Payer: Self-pay | Admitting: Nurse Practitioner

## 2022-02-15 DIAGNOSIS — C9000 Multiple myeloma not having achieved remission: Secondary | ICD-10-CM

## 2022-02-15 MED ORDER — OXYCODONE-ACETAMINOPHEN 5-325 MG PO TABS: 1.0000 | ORAL_TABLET | Freq: Four times a day (QID) | ORAL | 0 refills | Status: DC | PRN

## 2022-02-15 MED ORDER — LORAZEPAM 0.5 MG PO TABS: ORAL_TABLET | ORAL | 0 refills | Status: DC

## 2022-02-16 ENCOUNTER — Other Ambulatory Visit: Payer: Self-pay

## 2022-02-19 ENCOUNTER — Other Ambulatory Visit: Payer: Self-pay | Admitting: Oncology

## 2022-02-20 ENCOUNTER — Inpatient Hospital Stay: Payer: BLUE CROSS/BLUE SHIELD | Attending: Oncology

## 2022-02-20 ENCOUNTER — Inpatient Hospital Stay: Payer: BLUE CROSS/BLUE SHIELD

## 2022-02-20 VITALS — BP 118/78 | HR 86 | Temp 97.5°F | Resp 18 | Ht 68.0 in | Wt 177.0 lb

## 2022-02-20 DIAGNOSIS — C9000 Multiple myeloma not having achieved remission: Secondary | ICD-10-CM | POA: Diagnosis present

## 2022-02-20 DIAGNOSIS — D696 Thrombocytopenia, unspecified: Secondary | ICD-10-CM | POA: Diagnosis not present

## 2022-02-20 DIAGNOSIS — Z5112 Encounter for antineoplastic immunotherapy: Secondary | ICD-10-CM | POA: Insufficient documentation

## 2022-02-20 DIAGNOSIS — Z79899 Other long term (current) drug therapy: Secondary | ICD-10-CM | POA: Diagnosis not present

## 2022-02-20 DIAGNOSIS — D649 Anemia, unspecified: Secondary | ICD-10-CM | POA: Insufficient documentation

## 2022-02-20 DIAGNOSIS — J4489 Other specified chronic obstructive pulmonary disease: Secondary | ICD-10-CM | POA: Diagnosis not present

## 2022-02-20 DIAGNOSIS — F17200 Nicotine dependence, unspecified, uncomplicated: Secondary | ICD-10-CM | POA: Insufficient documentation

## 2022-02-20 LAB — CMP (CANCER CENTER ONLY)
ALT: 10 U/L (ref 0–44)
AST: 9 U/L — ABNORMAL LOW (ref 15–41)
Albumin: 4.4 g/dL (ref 3.5–5.0)
Alkaline Phosphatase: 57 U/L (ref 38–126)
Anion gap: 12 (ref 5–15)
BUN: 20 mg/dL (ref 6–20)
CO2: 23 mmol/L (ref 22–32)
Calcium: 10.4 mg/dL — ABNORMAL HIGH (ref 8.9–10.3)
Chloride: 100 mmol/L (ref 98–111)
Creatinine: 1.18 mg/dL (ref 0.61–1.24)
GFR, Estimated: >60 mL/min (ref >60)
Glucose, Bld: 120 mg/dL — ABNORMAL HIGH (ref 70–99)
Potassium: 3.9 mmol/L (ref 3.5–5.1)
Sodium: 135 mmol/L (ref 135–145)
Total Bilirubin: 0.3 mg/dL (ref 0.3–1.2)
Total Protein: 7.3 g/dL (ref 6.5–8.1)

## 2022-02-20 LAB — CBC WITH DIFFERENTIAL (CANCER CENTER ONLY)
Abs Immature Granulocytes: 0.12 10*3/uL — ABNORMAL HIGH (ref 0.00–0.07)
Basophils Absolute: 0 10*3/uL (ref 0.0–0.1)
Basophils Relative: 0 %
Eosinophils Absolute: 0 10*3/uL (ref 0.0–0.5)
Eosinophils Relative: 0 %
HCT: 42.6 % (ref 39.0–52.0)
Hemoglobin: 15 g/dL (ref 13.0–17.0)
Immature Granulocytes: 1 %
Lymphocytes Relative: 3 %
Lymphs Abs: 0.5 10*3/uL — ABNORMAL LOW (ref 0.7–4.0)
MCH: 34.1 pg — ABNORMAL HIGH (ref 26.0–34.0)
MCHC: 35.2 g/dL (ref 30.0–36.0)
MCV: 96.8 fL (ref 80.0–100.0)
Monocytes Absolute: 0.5 10*3/uL (ref 0.1–1.0)
Monocytes Relative: 3 %
Neutro Abs: 15.4 10*3/uL — ABNORMAL HIGH (ref 1.7–7.7)
Neutrophils Relative %: 93 %
Platelet Count: 171 10*3/uL (ref 150–400)
RBC: 4.4 MIL/uL (ref 4.22–5.81)
RDW: 15 % (ref 11.5–15.5)
WBC Count: 16.5 10*3/uL — ABNORMAL HIGH (ref 4.0–10.5)
nRBC: 0 % (ref 0.0–0.2)

## 2022-02-20 LAB — PHOSPHORUS: Phosphorus: 2.1 mg/dL — ABNORMAL LOW (ref 2.5–4.6)

## 2022-02-20 MED ORDER — DEXTROSE 5 % IV SOLN
56.0000 mg/m2 | Freq: Once | INTRAVENOUS | Status: AC
  Administered 2022-02-20: 110 mg via INTRAVENOUS
  Filled 2022-02-20: qty 30

## 2022-02-20 MED ORDER — SODIUM CHLORIDE 0.9 % IV SOLN: Freq: Once | INTRAVENOUS | Status: AC

## 2022-02-20 MED ORDER — DEXAMETHASONE 4 MG PO TABS: 20.0000 mg | ORAL_TABLET | Freq: Once | ORAL | Status: DC

## 2022-02-20 NOTE — Patient Instructions (Signed)
[facility]   Discharge Instructions: Thank you for choosing [facility] to provide your oncology and hematology care.   If you have a lab appointment with the Cancer Center, please go directly to the Cancer Center and check in at the registration area.   Wear comfortable clothing and clothing appropriate for easy access to any Portacath or PICC line.   We strive to give you quality time with your provider. You may need to reschedule your appointment if you arrive late (15 or more minutes).  Arriving late affects you and other patients whose appointments are after yours.  Also, if you miss three or more appointments without notifying the office, you may be dismissed from the clinic at the provider's discretion.      For prescription refill requests, have your pharmacy contact our office and allow 72 hours for refills to be completed.    Today you received the following chemotherapy and/or immunotherapy agents Kyprolis.      To help prevent nausea and vomiting after your treatment, we encourage you to take your nausea medication as directed.  BELOW ARE SYMPTOMS THAT SHOULD BE REPORTED IMMEDIATELY: *FEVER GREATER THAN 100.4 F (38 C) OR HIGHER *CHILLS OR SWEATING *NAUSEA AND VOMITING THAT IS NOT CONTROLLED WITH YOUR NAUSEA MEDICATION *UNUSUAL SHORTNESS OF BREATH *UNUSUAL BRUISING OR BLEEDING *URINARY PROBLEMS (pain or burning when urinating, or frequent urination) *BOWEL PROBLEMS (unusual diarrhea, constipation, pain near the anus) TENDERNESS IN MOUTH AND THROAT WITH OR WITHOUT PRESENCE OF ULCERS (sore throat, sores in mouth, or a toothache) UNUSUAL RASH, SWELLING OR PAIN  UNUSUAL VAGINAL DISCHARGE OR ITCHING   Items with * indicate a potential emergency and should be followed up as soon as possible or go to the Emergency Department if any problems should occur.  Please show the CHEMOTHERAPY ALERT CARD or IMMUNOTHERAPY ALERT CARD at check-in to  the Emergency Department and triage nurse.  Should you have questions after your visit or need to cancel or reschedule your appointment, please contact [facility]  Dept: [phone]  and follow the prompts.  Office hours are 8:00 a.m. to 4:30 p.m. Monday - Friday. Please note that voicemails left after 4:00 p.m. may not be returned until the following business day.  We are closed weekends and major holidays. You have access to a nurse at all times for urgent questions. Please call the main number to the clinic Dept: [phone] and follow the prompts.   For any non-urgent questions, you may also contact your provider using MyChart. We now offer e-Visits for anyone 18 and older to request care online for non-urgent symptoms. For details visit [email or web address].   Also download the MyChart app! Go to the app store, search "MyChart", open the app, select [facility], and log in with your MyChart username and password.  Masks are optional in the cancer centers. If you would like for your care team to wear a mask while they are taking care of you, please let them know. You may have one support person who is at least 50 years old accompany you for your appointments.  Carfilzomib Injection What is this medication? CARFILZOMIB (kar FILZ oh mib) treats multiple myeloma, a type of bone marrow cancer. It works by blocking a protein that causes cancer cells to grow and multiply. This helps to slow or stop the spread of cancer cells. This medicine may be used for other purposes; ask your health care provider or pharmacist   if you have questions. COMMON BRAND NAME(S): KYPROLIS What should I tell my care team before I take this medication? They need to know if you have any of these conditions: Heart disease History of blood clots Irregular heartbeat Kidney disease Liver disease Lung or breathing disease An unusual or allergic reaction to carfilzomib, or other medications,  foods, dyes, or preservatives If you or your partner are pregnant or trying to get pregnant Breastfeeding How should I use this medication? This medication is injected into a vein. It is given by your care team in a hospital or clinic setting. Talk to your care team about the use of this medication in children. Special care may be needed. Overdosage: If you think you have taken too much of this medicine contact a poison control center or emergency room at once. NOTE: This medicine is only for you. Do not share this medicine with others. What if I miss a dose? Keep appointments for follow-up doses. It is important not to miss your dose. Call your care team if you are unable to keep an appointment. What may interact with this medication? Interactions are not expected. This list may not describe all possible interactions. Give your health care provider a list of all the medicines, herbs, non-prescription drugs, or dietary supplements you use. Also tell them if you smoke, drink alcohol, or use illegal drugs. Some items may interact with your medicine. What should I watch for while using this medication? Your condition will be monitored carefully while you are receiving this medication. You may need blood work while taking this medication. Check with your care team if you have severe diarrhea, nausea, and vomiting, or if you sweat a lot. The loss of too much body fluid may make it dangerous for you to take this medication. This medication may affect your coordination, reaction time, or judgment. Do not drive or operate machinery until you know how this medication affects you. Sit up or stand slowly to reduce the risk of dizzy or fainting spells. Drinking alcohol with this medication can increase the risk of these side effects. Talk to your care team if you may be pregnant. Serious birth defects can occur if you take this medication during pregnancy and for 6 months after the last dose. You will need a  negative pregnancy test before starting this medication. Contraception is recommended while taking this medication and for 6 months after the last dose. Your care team can help you find an option that works for you. If your partner can get pregnant, use a condom during sex while taking this medication and for 3 months after the last dose. Do not breastfeed while taking this medication and for 2 weeks after the last dose. This medication may cause infertility. Talk to your care team if you are concerned about your fertility. What side effects may I notice from receiving this medication? Side effects that you should report to your care team as soon as possible: Allergic reactions--skin rash, itching, hives, swelling of the face, lips, tongue, or throat Bleeding--bloody or black, tar-like stools, vomiting blood or brown material that looks like coffee grounds, red or dark brown urine, small red or purple spots on skin, unusual bruising or bleeding Blood clot--pain, swelling, or warmth in the leg, shortness of breath, chest pain Dizziness, loss of balance or coordination, confusion or trouble speaking Heart attack--pain or tightness in the chest, shoulders, arms, or jaw, nausea, shortness of breath, cold or clammy skin, feeling faint or lightheaded Heart failure--shortness  of breath, swelling of the ankles, feet, or hands, sudden weight gain, unusual weakness or fatigue Heart rhythm changes--fast or irregular heartbeat, dizziness, feeling faint or lightheaded, chest pain, trouble breathing Increase in blood pressure Infection--fever, chills, cough, sore throat, wounds that don't heal, pain or trouble when passing urine, general feeling of discomfort or being unwell Infusion reactions--chest pain, shortness of breath or trouble breathing, feeling faint or lightheaded Kidney injury--decrease in the amount of urine, swelling of the ankles, hands, or feet Liver injury--right upper belly pain, loss of  appetite, nausea, light-colored stool, dark yellow or brown urine, yellowing skin or eyes, unusual weakness or fatigue Lung injury--shortness of breath or trouble breathing, cough, spitting up blood, chest pain, fever Pulmonary hypertension--shortness of breath, chest pain, fast or irregular heartbeat, feeling faint or lightheaded, fatigue, swelling of the ankles or feet Stomach pain, bloody diarrhea, pale skin, unusual weakness or fatigue, decrease in the amount of urine, which may be signs of hemolytic uremic syndrome Sudden and severe headache, confusion, change in vision, seizures, which may be signs of posterior reversible encephalopathy syndrome (PRES) TTP--purple spots on the skin or inside the mouth, pale skin, yellowing skin or eyes, unusual weakness or fatigue, fever, fast or irregular heartbeat, confusion, change in vision, trouble speaking, trouble walking Tumor lysis syndrome (TLS)--nausea, vomiting, diarrhea, decrease in the amount of urine, dark urine, unusual weakness or fatigue, confusion, muscle pain or cramps, fast or irregular heartbeat, joint pain Side effects that usually do not require medical attention (report to your care team if they continue or are bothersome): Diarrhea Fatigue Nausea Trouble sleeping This list may not describe all possible side effects. Call your doctor for medical advice about side effects. You may report side effects to FDA at 1-800-FDA-1088. Where should I keep my medication? This medication is given in a hospital or clinic. It will not be stored at home. NOTE: This sheet is a summary. It may not cover all possible information. If you have questions about this medicine, talk to your doctor, pharmacist, or health care provider.  2023 Elsevier/Gold Standard (2021-08-03 00:00:00)  

## 2022-02-21 ENCOUNTER — Other Ambulatory Visit: Payer: Self-pay | Admitting: Nurse Practitioner

## 2022-02-21 DIAGNOSIS — C9000 Multiple myeloma not having achieved remission: Secondary | ICD-10-CM

## 2022-02-22 ENCOUNTER — Encounter: Payer: Self-pay | Admitting: Oncology

## 2022-02-26 ENCOUNTER — Other Ambulatory Visit: Payer: Self-pay | Admitting: Oncology

## 2022-02-27 ENCOUNTER — Inpatient Hospital Stay: Payer: BLUE CROSS/BLUE SHIELD

## 2022-02-27 ENCOUNTER — Inpatient Hospital Stay: Payer: BLUE CROSS/BLUE SHIELD | Admitting: Nurse Practitioner

## 2022-02-28 ENCOUNTER — Telehealth: Payer: Self-pay

## 2022-02-28 NOTE — Telephone Encounter (Signed)
Jordan King called to cancel his appointment due to having bronchitis. He currently taking 5 days of prednisone. He is reschedule to come and I made [facility] aware.

## 2022-03-03 ENCOUNTER — Other Ambulatory Visit: Payer: Self-pay | Admitting: *Deleted

## 2022-03-03 MED ORDER — VENETOCLAX 100 MG PO TABS: 100.0000 mg | ORAL_TABLET | Freq: Every day | ORAL | 1 refills | Status: DC

## 2022-03-03 NOTE — Telephone Encounter (Signed)
Faxed request for refill on Venetoclax received from [facility] pharmacy. Refill e-scribed

## 2022-03-06 ENCOUNTER — Telehealth (HOSPITAL_BASED_OUTPATIENT_CLINIC_OR_DEPARTMENT_OTHER): Payer: Self-pay | Admitting: *Deleted

## 2022-03-06 ENCOUNTER — Inpatient Hospital Stay: Payer: BLUE CROSS/BLUE SHIELD

## 2022-03-06 ENCOUNTER — Inpatient Hospital Stay (HOSPITAL_BASED_OUTPATIENT_CLINIC_OR_DEPARTMENT_OTHER): Payer: BLUE CROSS/BLUE SHIELD | Admitting: Nurse Practitioner

## 2022-03-06 ENCOUNTER — Encounter: Payer: Self-pay | Admitting: Nurse Practitioner

## 2022-03-06 VITALS — BP 129/86 | HR 102 | Temp 98.1°F | Resp 18 | Ht 68.0 in | Wt 173.6 lb

## 2022-03-06 VITALS — BP 111/82 | HR 94 | Resp 20

## 2022-03-06 DIAGNOSIS — C9000 Multiple myeloma not having achieved remission: Secondary | ICD-10-CM

## 2022-03-06 LAB — CBC WITH DIFFERENTIAL (CANCER CENTER ONLY)
Abs Immature Granulocytes: 0.08 10*3/uL — ABNORMAL HIGH (ref 0.00–0.07)
Basophils Absolute: 0 10*3/uL (ref 0.0–0.1)
Basophils Relative: 0 %
Eosinophils Absolute: 0.1 10*3/uL (ref 0.0–0.5)
Eosinophils Relative: 1 %
HCT: 40.8 % (ref 39.0–52.0)
Hemoglobin: 14.4 g/dL (ref 13.0–17.0)
Immature Granulocytes: 1 %
Lymphocytes Relative: 3 %
Lymphs Abs: 0.5 10*3/uL — ABNORMAL LOW (ref 0.7–4.0)
MCH: 34.4 pg — ABNORMAL HIGH (ref 26.0–34.0)
MCHC: 35.3 g/dL (ref 30.0–36.0)
MCV: 97.6 fL (ref 80.0–100.0)
Monocytes Absolute: 0.5 10*3/uL (ref 0.1–1.0)
Monocytes Relative: 3 %
Neutro Abs: 15.5 10*3/uL — ABNORMAL HIGH (ref 1.7–7.7)
Neutrophils Relative %: 92 %
Platelet Count: 233 10*3/uL (ref 150–400)
RBC: 4.18 MIL/uL — ABNORMAL LOW (ref 4.22–5.81)
RDW: 14.7 % (ref 11.5–15.5)
WBC Count: 16.8 10*3/uL — ABNORMAL HIGH (ref 4.0–10.5)
nRBC: 0 % (ref 0.0–0.2)

## 2022-03-06 LAB — CMP (CANCER CENTER ONLY)
ALT: 11 U/L (ref 0–44)
AST: 11 U/L — ABNORMAL LOW (ref 15–41)
Albumin: 4.4 g/dL (ref 3.5–5.0)
Alkaline Phosphatase: 53 U/L (ref 38–126)
Anion gap: 13 (ref 5–15)
BUN: 13 mg/dL (ref 6–20)
CO2: 24 mmol/L (ref 22–32)
Calcium: 10.2 mg/dL (ref 8.9–10.3)
Chloride: 96 mmol/L — ABNORMAL LOW (ref 98–111)
Creatinine: 1.14 mg/dL (ref 0.61–1.24)
GFR, Estimated: >60 mL/min (ref >60)
Glucose, Bld: 131 mg/dL — ABNORMAL HIGH (ref 70–99)
Potassium: 3.9 mmol/L (ref 3.5–5.1)
Sodium: 133 mmol/L — ABNORMAL LOW (ref 135–145)
Total Bilirubin: 0.5 mg/dL (ref 0.3–1.2)
Total Protein: 7.5 g/dL (ref 6.5–8.1)

## 2022-03-06 LAB — PHOSPHORUS: Phosphorus: 2.6 mg/dL (ref 2.5–4.6)

## 2022-03-06 MED ORDER — DEXTROSE 5 % IV SOLN
56.0000 mg/m2 | Freq: Once | INTRAVENOUS | Status: AC
  Administered 2022-03-06: 110 mg via INTRAVENOUS
  Filled 2022-03-06: qty 15

## 2022-03-06 MED ORDER — DEXAMETHASONE 4 MG PO TABS: 20.0000 mg | ORAL_TABLET | Freq: Once | ORAL | Status: DC

## 2022-03-06 MED ORDER — SODIUM CHLORIDE 0.9 % IV SOLN: Freq: Once | INTRAVENOUS | Status: AC

## 2022-03-06 NOTE — Patient Instructions (Signed)
[facility]   Discharge Instructions: Thank you for choosing [facility] to provide your oncology and hematology care.   If you have a lab appointment with the Cancer Center, please go directly to the Cancer Center and check in at the registration area.   Wear comfortable clothing and clothing appropriate for easy access to any Portacath or PICC line.   We strive to give you quality time with your provider. You may need to reschedule your appointment if you arrive late (15 or more minutes).  Arriving late affects you and other patients whose appointments are after yours.  Also, if you miss three or more appointments without notifying the office, you may be dismissed from the clinic at the provider's discretion.      For prescription refill requests, have your pharmacy contact our office and allow 72 hours for refills to be completed.    Today you received the following chemotherapy and/or immunotherapy agents Kyprolis.      To help prevent nausea and vomiting after your treatment, we encourage you to take your nausea medication as directed.  BELOW ARE SYMPTOMS THAT SHOULD BE REPORTED IMMEDIATELY: *FEVER GREATER THAN 100.4 F (38 C) OR HIGHER *CHILLS OR SWEATING *NAUSEA AND VOMITING THAT IS NOT CONTROLLED WITH YOUR NAUSEA MEDICATION *UNUSUAL SHORTNESS OF BREATH *UNUSUAL BRUISING OR BLEEDING *URINARY PROBLEMS (pain or burning when urinating, or frequent urination) *BOWEL PROBLEMS (unusual diarrhea, constipation, pain near the anus) TENDERNESS IN MOUTH AND THROAT WITH OR WITHOUT PRESENCE OF ULCERS (sore throat, sores in mouth, or a toothache) UNUSUAL RASH, SWELLING OR PAIN  UNUSUAL VAGINAL DISCHARGE OR ITCHING   Items with * indicate a potential emergency and should be followed up as soon as possible or go to the Emergency Department if any problems should occur.  Please show the CHEMOTHERAPY ALERT CARD or IMMUNOTHERAPY ALERT CARD at check-in to  the Emergency Department and triage nurse.  Should you have questions after your visit or need to cancel or reschedule your appointment, please contact [facility]  Dept: [phone]  and follow the prompts.  Office hours are 8:00 a.m. to 4:30 p.m. Monday - Friday. Please note that voicemails left after 4:00 p.m. may not be returned until the following business day.  We are closed weekends and major holidays. You have access to a nurse at all times for urgent questions. Please call the main number to the clinic Dept: [phone] and follow the prompts.   For any non-urgent questions, you may also contact your provider using MyChart. We now offer e-Visits for anyone 18 and older to request care online for non-urgent symptoms. For details visit [email or web address].   Also download the MyChart app! Go to the app store, search "MyChart", open the app, select , and log in with your MyChart username and password.  Masks are optional in the cancer centers. If you would like for your care team to wear a mask while they are taking care of you, please let them know. You may have one support person who is at least 50 years old accompany you for your appointments.  Carfilzomib Injection What is this medication? CARFILZOMIB (kar FILZ oh mib) treats multiple myeloma, a type of bone marrow cancer. It works by blocking a protein that causes cancer cells to grow and multiply. This helps to slow or stop the spread of cancer cells. This medicine may be used for other purposes; ask your health care provider or pharmacist   if you have questions. COMMON BRAND NAME(S): KYPROLIS What should I tell my care team before I take this medication? They need to know if you have any of these conditions: Heart disease History of blood clots Irregular heartbeat Kidney disease Liver disease Lung or breathing disease An unusual or allergic reaction to carfilzomib, or other medications,  foods, dyes, or preservatives If you or your partner are pregnant or trying to get pregnant Breastfeeding How should I use this medication? This medication is injected into a vein. It is given by your care team in a hospital or clinic setting. Talk to your care team about the use of this medication in children. Special care may be needed. Overdosage: If you think you have taken too much of this medicine contact a poison control center or emergency room at once. NOTE: This medicine is only for you. Do not share this medicine with others. What if I miss a dose? Keep appointments for follow-up doses. It is important not to miss your dose. Call your care team if you are unable to keep an appointment. What may interact with this medication? Interactions are not expected. This list may not describe all possible interactions. Give your health care provider a list of all the medicines, herbs, non-prescription drugs, or dietary supplements you use. Also tell them if you smoke, drink alcohol, or use illegal drugs. Some items may interact with your medicine. What should I watch for while using this medication? Your condition will be monitored carefully while you are receiving this medication. You may need blood work while taking this medication. Check with your care team if you have severe diarrhea, nausea, and vomiting, or if you sweat a lot. The loss of too much body fluid may make it dangerous for you to take this medication. This medication may affect your coordination, reaction time, or judgment. Do not drive or operate machinery until you know how this medication affects you. Sit up or stand slowly to reduce the risk of dizzy or fainting spells. Drinking alcohol with this medication can increase the risk of these side effects. Talk to your care team if you may be pregnant. Serious birth defects can occur if you take this medication during pregnancy and for 6 months after the last dose. You will need a  negative pregnancy test before starting this medication. Contraception is recommended while taking this medication and for 6 months after the last dose. Your care team can help you find an option that works for you. If your partner can get pregnant, use a condom during sex while taking this medication and for 3 months after the last dose. Do not breastfeed while taking this medication and for 2 weeks after the last dose. This medication may cause infertility. Talk to your care team if you are concerned about your fertility. What side effects may I notice from receiving this medication? Side effects that you should report to your care team as soon as possible: Allergic reactions--skin rash, itching, hives, swelling of the face, lips, tongue, or throat Bleeding--bloody or black, tar-like stools, vomiting blood or brown material that looks like coffee grounds, red or dark brown urine, small red or purple spots on skin, unusual bruising or bleeding Blood clot--pain, swelling, or warmth in the leg, shortness of breath, chest pain Dizziness, loss of balance or coordination, confusion or trouble speaking Heart attack--pain or tightness in the chest, shoulders, arms, or jaw, nausea, shortness of breath, cold or clammy skin, feeling faint or lightheaded Heart failure--shortness  of breath, swelling of the ankles, feet, or hands, sudden weight gain, unusual weakness or fatigue Heart rhythm changes--fast or irregular heartbeat, dizziness, feeling faint or lightheaded, chest pain, trouble breathing Increase in blood pressure Infection--fever, chills, cough, sore throat, wounds that don't heal, pain or trouble when passing urine, general feeling of discomfort or being unwell Infusion reactions--chest pain, shortness of breath or trouble breathing, feeling faint or lightheaded Kidney injury--decrease in the amount of urine, swelling of the ankles, hands, or feet Liver injury--right upper belly pain, loss of  appetite, nausea, light-colored stool, dark yellow or brown urine, yellowing skin or eyes, unusual weakness or fatigue Lung injury--shortness of breath or trouble breathing, cough, spitting up blood, chest pain, fever Pulmonary hypertension--shortness of breath, chest pain, fast or irregular heartbeat, feeling faint or lightheaded, fatigue, swelling of the ankles or feet Stomach pain, bloody diarrhea, pale skin, unusual weakness or fatigue, decrease in the amount of urine, which may be signs of hemolytic uremic syndrome Sudden and severe headache, confusion, change in vision, seizures, which may be signs of posterior reversible encephalopathy syndrome (PRES) TTP--purple spots on the skin or inside the mouth, pale skin, yellowing skin or eyes, unusual weakness or fatigue, fever, fast or irregular heartbeat, confusion, change in vision, trouble speaking, trouble walking Tumor lysis syndrome (TLS)--nausea, vomiting, diarrhea, decrease in the amount of urine, dark urine, unusual weakness or fatigue, confusion, muscle pain or cramps, fast or irregular heartbeat, joint pain Side effects that usually do not require medical attention (report to your care team if they continue or are bothersome): Diarrhea Fatigue Nausea Trouble sleeping This list may not describe all possible side effects. Call your doctor for medical advice about side effects. You may report side effects to FDA at 1-800-FDA-1088. Where should I keep my medication? This medication is given in a hospital or clinic. It will not be stored at home. NOTE: This sheet is a summary. It may not cover all possible information. If you have questions about this medicine, talk to your doctor, pharmacist, or health care provider.  2023 Elsevier/Gold Standard (2021-08-03 00:00:00)  

## 2022-03-06 NOTE — Progress Notes (Signed)
[place] OFFICE PROGRESS NOTE   Diagnosis: Multiple myeloma  INTERVAL HISTORY:   Jordan King returns as scheduled.  He continues venetoclax.  He completed Carfilzomib 02/20/2022.  He canceled treatment 02/27/2022 due to bronchitis.  He completed a Z-Pak and prednisone taper.  The dyspnea is better.  He continues to smoke.  He denies nausea/vomiting.  No mouth sores.  No diarrhea.  He notes shortness of breath for 1 to 2 hours after each Carfilzomib treatment.  He reports his breathing returns to normal by the time he gets home.  Objective:  Vital signs in last 24 hours:  Blood pressure 129/86, pulse (!) 102, temperature 98.1 F (36.7 C), temperature source Oral, resp. rate 18, height _0  (1.727 m), weight 173 lb 9.6 oz (78.7 kg), SpO2 96 %.    HEENT: No thrush or ulcers. Resp: Bilateral wheezes.  No respiratory distress. Cardio: Regular rate and rhythm. GI: No hepatosplenomegaly. Vascular: No leg edema. Neuro: Alert and oriented. Skin: No rash.   Lab Results:  Lab Results  Component Value Date   WBC 16.8 (H) 03/06/2022   HGB 14.4 03/06/2022   HCT 40.8 03/06/2022   MCV 97.6 03/06/2022   PLT 233 03/06/2022   NEUTROABS 15.5 (H) 03/06/2022    Imaging:  No results found.  Medications: I have reviewed the patient's current medications.  Assessment/Plan: Multiple myeloma -05/22/2021-kappa free light chain 11.3, lambda free light chain 17,028 -25 g proteinuria, 20 g lambda light chains -Serum immunofixation 05/22/2021-monoclonal lambda light chains -Bone marrow biopsy 05/23/2021-hypercellular marrow involved by plasma cell myeloma, 80-90% plasma cells on the bone marrow biopsy, diminished iron stains -Cytogenetics-45 X, minus Y, t11:14 (11 q13) -Cycle 1 Velcade/Decadron 05/23/2021 (Decadron given 05/22/2021) -Cycle 1 DRVd 06/13/2021, treatment held 06/20/2021 due to a rash, Revlimid resumed 06/28/2021, daratumumab resumed 07/04/2021 Revlimid resumed 06/28/2021 Cycle 2  daratumumab/Revlimid/Decadron/Velcade 07/18/2021 (Revlimid started 07/19/2021, placed on hold 07/25/2021 secondary to a rash-last taken 07/24/2021) Treatment continued with weekly Velcade/Decadron and daratumumab Treatment changed to pomalidomide, daratumumab, Decadron 10/03/2021 Pomalidomide placed on hold 10/17/2021 due to a rash Pomalidomide resumed at a reduced dose of 2 mg daily for 21 days followed by 7-day break 10/31/2021 Pomalidomide 2 mg daily for 21 days beginning 11/28/2021 Lambda light chains increased 12/12/2021 Venetoclax/Carfilzomib/Decadron 12/26/2021, venetoclax started 01/05/2022 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 Carfilzomib/Decadron weekly x3 beginning 01/16/2022 Venetoclax resumed 01/19/2022 100 mg daily Carfilzomib/Decadron weekly x 3 beginning 02/13/2022   Renal failure secondary to #1-improved Hypercalcemia-status post treat with calcitonin, pamidronate 05/22/2021-resolved Lytic bone lesions Anemia Thrombocytopenia Hypocalcemia-IV calcium gluconate 05/30/2021, resolved Rash 06/20/2021,  consistent with a drug rash, recurrent rash 07/24/2021-Revlimid placed on hold; recurrent rash 10/17/2021 Admission 01/09/2022 with renal failure, elevated uric acid, rasburicase 01/09/2022 Wheezing/cough-likely secondary to COPD and asthma  Disposition: Jordan King appears unchanged.  He will continue venetoclax.  He canceled his Carfilzomib appointment last week due to "bronchitis".  Symptoms are better.  At today's visit he reports developing dyspnea following each Carfilzomib infusion, prior to leaving the office.  The dyspnea resolves over the course of 1 to 2 hours.  Plan to continue Carfilzomib.  He will notify the office if the dyspnea does not resolve.  CBC and chemistry panel reviewed.  Labs adequate to proceed as above.  He will return for lab and Carfilzomib in 1 week.  We will see him in follow-up in 2 weeks.  He will contact the office in the interim as  outlined above or with any  other problems.  Patient seen with Dr. Benay Spice.    Ned Card ANP/GNP-BC   03/06/2022  11:16 AM   This was a shared visit with Ned Card.  Jordan King was interviewed and examined.  He will complete another cycle of carfilzomib beginning today.  He continues venetoclax.  He has experienced improvement in the elevated free serum light chains with the current systemic therapy regimen.  The plan is to continue carfilzomib/venetoclax/Decadron.  He plans to follow-up with [facility] to decide on proceeding with stem cell therapy.  Jordan King reports increased dyspnea after the last several treatments with carfilzomib.  The dyspnea is transient.  He reports resolution after using his inhaler.  He has chronic exertional dyspnea predating the myeloma diagnosis.  He continues smoking.  I suspect the dyspnea is related to COPD, potentially exacerbated by acute reversible toxicity from carfilzomib.  He will call for increased dyspnea.  He will continue every 3 months Zometa.  I was present for greater than 50% today's visit.  I performed medical decision making.  Julieanne Manson, MD

## 2022-03-06 NOTE — Progress Notes (Signed)
Patient seen by Ned Card NP today  Vitals are not all within treatment parameters. HR is 102  Labs reviewed by Ned Card NP and are within treatment parameters.  Per physician team, patient is ready for treatment and there are NO modifications to the treatment plan.

## 2022-03-06 NOTE — Telephone Encounter (Signed)
Staff message sent for insurance prior authorization for the Echocardiogram ordered by Ned Card, NP

## 2022-03-07 ENCOUNTER — Encounter (HOSPITAL_BASED_OUTPATIENT_CLINIC_OR_DEPARTMENT_OTHER): Payer: Self-pay | Admitting: Internal Medicine

## 2022-03-07 ENCOUNTER — Other Ambulatory Visit: Payer: Self-pay

## 2022-03-07 NOTE — Telephone Encounter (Signed)
Left message for patient to call and schedule the Echocardiogram ordered by Ned Card, NP

## 2022-03-08 ENCOUNTER — Other Ambulatory Visit: Payer: Self-pay

## 2022-03-08 LAB — KAPPA/LAMBDA LIGHT CHAINS
Kappa free light chain: 6.6 mg/L (ref 3.3–19.4)
Kappa, lambda light chain ratio: 1.74 — ABNORMAL HIGH (ref 0.26–1.65)
Lambda free light chains: 3.8 mg/L — ABNORMAL LOW (ref 5.7–26.3)

## 2022-03-09 ENCOUNTER — Telehealth: Payer: Self-pay

## 2022-03-09 NOTE — Telephone Encounter (Signed)
Telephone call to patient regarding his appointments on 03/22/22.  Lab and treatment appointment moved to 03/21/22 at 1100 and 1130 respectively. Patient is aware of this and agreeable.

## 2022-03-13 ENCOUNTER — Other Ambulatory Visit: Payer: Self-pay | Admitting: Oncology

## 2022-03-14 NOTE — Telephone Encounter (Signed)
Called patient to discuss scheduling the Echocardiogram ordered by Ned Card, NP.  Patient would like to have testing done closer to home and he states he will discuss with Dr.Sherrill at his next visit.  The available times that were offered fo [facility] were not convenient for the patient.

## 2022-03-15 ENCOUNTER — Inpatient Hospital Stay: Payer: BLUE CROSS/BLUE SHIELD

## 2022-03-15 ENCOUNTER — Inpatient Hospital Stay: Payer: BLUE CROSS/BLUE SHIELD | Admitting: Nutrition

## 2022-03-15 ENCOUNTER — Encounter: Payer: Self-pay | Admitting: *Deleted

## 2022-03-15 ENCOUNTER — Inpatient Hospital Stay (HOSPITAL_BASED_OUTPATIENT_CLINIC_OR_DEPARTMENT_OTHER): Payer: BLUE CROSS/BLUE SHIELD | Admitting: Oncology

## 2022-03-15 VITALS — BP 124/74 | HR 79 | Resp 18

## 2022-03-15 VITALS — BP 126/84 | HR 90 | Temp 98.2°F | Resp 18 | Ht 68.0 in | Wt 176.2 lb

## 2022-03-15 DIAGNOSIS — C9000 Multiple myeloma not having achieved remission: Secondary | ICD-10-CM | POA: Diagnosis not present

## 2022-03-15 LAB — CMP (CANCER CENTER ONLY)
ALT: 9 U/L (ref 0–44)
AST: 9 U/L — ABNORMAL LOW (ref 15–41)
Albumin: 4.2 g/dL (ref 3.5–5.0)
Alkaline Phosphatase: 53 U/L (ref 38–126)
Anion gap: 11 (ref 5–15)
BUN: 28 mg/dL — ABNORMAL HIGH (ref 6–20)
CO2: 25 mmol/L (ref 22–32)
Calcium: 9.8 mg/dL (ref 8.9–10.3)
Chloride: 100 mmol/L (ref 98–111)
Creatinine: 1.17 mg/dL (ref 0.61–1.24)
GFR, Estimated: >60 mL/min (ref >60)
Glucose, Bld: 99 mg/dL (ref 70–99)
Potassium: 4 mmol/L (ref 3.5–5.1)
Sodium: 136 mmol/L (ref 135–145)
Total Bilirubin: 0.3 mg/dL (ref 0.3–1.2)
Total Protein: 6.7 g/dL (ref 6.5–8.1)

## 2022-03-15 LAB — CBC WITH DIFFERENTIAL (CANCER CENTER ONLY)
Abs Immature Granulocytes: 0.06 10*3/uL (ref 0.00–0.07)
Basophils Absolute: 0 10*3/uL (ref 0.0–0.1)
Basophils Relative: 0 %
Eosinophils Absolute: 0.1 10*3/uL (ref 0.0–0.5)
Eosinophils Relative: 1 %
HCT: 39 % (ref 39.0–52.0)
Hemoglobin: 13.7 g/dL (ref 13.0–17.0)
Immature Granulocytes: 1 %
Lymphocytes Relative: 20 %
Lymphs Abs: 1.8 10*3/uL (ref 0.7–4.0)
MCH: 34.3 pg — ABNORMAL HIGH (ref 26.0–34.0)
MCHC: 35.1 g/dL (ref 30.0–36.0)
MCV: 97.5 fL (ref 80.0–100.0)
Monocytes Absolute: 0.9 10*3/uL (ref 0.1–1.0)
Monocytes Relative: 9 %
Neutro Abs: 6.4 10*3/uL (ref 1.7–7.7)
Neutrophils Relative %: 69 %
Platelet Count: 195 10*3/uL (ref 150–400)
RBC: 4 MIL/uL — ABNORMAL LOW (ref 4.22–5.81)
RDW: 14.3 % (ref 11.5–15.5)
WBC Count: 9.3 10*3/uL (ref 4.0–10.5)
nRBC: 0 % (ref 0.0–0.2)

## 2022-03-15 MED ORDER — DEXTROSE 5 % IV SOLN
56.0000 mg/m2 | Freq: Once | INTRAVENOUS | Status: AC
  Administered 2022-03-15: 110 mg via INTRAVENOUS
  Filled 2022-03-15: qty 10

## 2022-03-15 MED ORDER — DEXAMETHASONE 4 MG PO TABS: 20.0000 mg | ORAL_TABLET | Freq: Once | ORAL | Status: DC

## 2022-03-15 MED ORDER — DEXAMETHASONE 4 MG PO TABS: 20.0000 mg | ORAL_TABLET | ORAL | 0 refills | Status: DC

## 2022-03-15 MED ORDER — SODIUM CHLORIDE 0.9 % IV SOLN: Freq: Once | INTRAVENOUS | Status: AC

## 2022-03-15 NOTE — Progress Notes (Signed)
[place] OFFICE PROGRESS NOTE   Diagnosis: Multiple myeloma  INTERVAL HISTORY:   Jordan King returns as scheduled.  He completed another treatment with carfilzomib/Decadron on 03/06/2022.  He tolerated the treatment well.  No neuropathy symptoms.  No dyspnea following this cycle of chemotherapy his dyspnea has improved.  He feels a recent episode of "bronchitis "is resolving.  No difficulty with peripheral IV access.  Objective:  Vital signs in last 24 hours:  Blood pressure 126/84, pulse 90, temperature 98.2 F (36.8 C), temperature source Oral, resp. rate 18, height _0  (1.727 m), weight 176 lb 3.2 oz (79.9 kg), SpO2 99 %.    HEENT: No thrush Resp: Lungs clear bilaterally, no respiratory distress Cardio: Regular rate and rhythm GI: No hepatosplenomegaly Vascular: No leg edema    Lab Results:  Lab Results  Component Value Date   WBC 9.3 03/15/2022   HGB 13.7 03/15/2022   HCT 39.0 03/15/2022   MCV 97.5 03/15/2022   PLT 195 03/15/2022   NEUTROABS 6.4 03/15/2022    CMP  Lab Results  Component Value Date   NA 136 03/15/2022   K 4.0 03/15/2022   CL 100 03/15/2022   CO2 25 03/15/2022   GLUCOSE 99 03/15/2022   BUN 28 (H) 03/15/2022   CREATININE 1.17 03/15/2022   CALCIUM 9.8 03/15/2022   PROT 6.7 03/15/2022   ALBUMIN 4.2 03/15/2022   AST 9 (L) 03/15/2022   ALT 9 03/15/2022   ALKPHOS 53 03/15/2022   BILITOT 0.3 03/15/2022   GFRNONAA >60 03/15/2022    Medications: I have reviewed the patient's current medications.   Assessment/Plan:  Multiple myeloma -05/22/2021-kappa free light chain 11.3, lambda free light chain 17,028 -25 g proteinuria, 20 g lambda light chains -Serum immunofixation 05/22/2021-monoclonal lambda light chains -Bone marrow biopsy 05/23/2021-hypercellular marrow involved by plasma cell myeloma, 80-90% plasma cells on the bone marrow biopsy, diminished iron stains -Cytogenetics-45 X, minus Y, t11:14 (11 q13) -Cycle 1  Velcade/Decadron 05/23/2021 (Decadron given 05/22/2021) -Cycle 1 DRVd 06/13/2021, treatment held 06/20/2021 due to a rash, Revlimid resumed 06/28/2021, daratumumab resumed 07/04/2021 Revlimid resumed 06/28/2021 Cycle 2 daratumumab/Revlimid/Decadron/Velcade 07/18/2021 (Revlimid started 07/19/2021, placed on hold 07/25/2021 secondary to a rash-last taken 07/24/2021) Treatment continued with weekly Velcade/Decadron and daratumumab Treatment changed to pomalidomide, daratumumab, Decadron 10/03/2021 Pomalidomide placed on hold 10/17/2021 due to a rash Pomalidomide resumed at a reduced dose of 2 mg daily for 21 days followed by 7-day break 10/31/2021 Pomalidomide 2 mg daily for 21 days beginning 11/28/2021 Lambda light chains increased 12/12/2021 Venetoclax/Carfilzomib/Decadron 12/26/2021, venetoclax started 01/05/2022 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 Carfilzomib/Decadron weekly x3 beginning 01/16/2022 Venetoclax resumed 01/19/2022 100 mg daily Carfilzomib/Decadron weekly x 3 beginning 02/13/2022 (day 15 not given) Carfilzomib /Decadron weekly x 3 beginning 03/06/2022   Renal failure secondary to #1-improved Hypercalcemia-status post treat with calcitonin, pamidronate 05/22/2021-resolved Lytic bone lesions Anemia Thrombocytopenia Hypocalcemia-IV calcium gluconate 05/30/2021, resolved Rash 06/20/2021,  consistent with a drug rash, recurrent rash 07/24/2021-Revlimid placed on hold; recurrent rash 10/17/2021 Admission 01/09/2022 with renal failure, elevated uric acid, rasburicase 01/09/2022 Wheezing/cough-likely secondary to COPD and asthma   Disposition: Mr Malenfant appears stable.  He appears to be tolerating the carfilzomib and venetoclax well.  The light chains were normal earlier this month.  The plan is to continue carfilzomib on a 3/4-week schedule.  He continues venetoclax.  He reports resolution of dyspnea he experienced earlier this month.  The dyspnea was likely related to a flare  of COPD.  He reports he used a nebulizer treatment prior to the office visit 03/06/2022.  This may have accounted for the tachycardia on 03/06/2022.  He will complete treatment with carfilzomib/Decadron today and again on 03/21/2021.  He will return for an office visit on 04/04/2021.  Betsy Coder, MD  03/15/2022  12:24 PM

## 2022-03-15 NOTE — Patient Instructions (Signed)
[facility]   Discharge Instructions: Thank you for choosing [facility] to provide your oncology and hematology care.   If you have a lab appointment with the Cancer Center, please go directly to the Cancer Center and check in at the registration area.   Wear comfortable clothing and clothing appropriate for easy access to any Portacath or PICC line.   We strive to give you quality time with your provider. You may need to reschedule your appointment if you arrive late (15 or more minutes).  Arriving late affects you and other patients whose appointments are after yours.  Also, if you miss three or more appointments without notifying the office, you may be dismissed from the clinic at the provider's discretion.      For prescription refill requests, have your pharmacy contact our office and allow 72 hours for refills to be completed.    Today you received the following chemotherapy and/or immunotherapy agents Kyprolis.      To help prevent nausea and vomiting after your treatment, we encourage you to take your nausea medication as directed.  BELOW ARE SYMPTOMS THAT SHOULD BE REPORTED IMMEDIATELY: *FEVER GREATER THAN 100.4 F (38 C) OR HIGHER *CHILLS OR SWEATING *NAUSEA AND VOMITING THAT IS NOT CONTROLLED WITH YOUR NAUSEA MEDICATION *UNUSUAL SHORTNESS OF BREATH *UNUSUAL BRUISING OR BLEEDING *URINARY PROBLEMS (pain or burning when urinating, or frequent urination) *BOWEL PROBLEMS (unusual diarrhea, constipation, pain near the anus) TENDERNESS IN MOUTH AND THROAT WITH OR WITHOUT PRESENCE OF ULCERS (sore throat, sores in mouth, or a toothache) UNUSUAL RASH, SWELLING OR PAIN  UNUSUAL VAGINAL DISCHARGE OR ITCHING   Items with * indicate a potential emergency and should be followed up as soon as possible or go to the Emergency Department if any problems should occur.  Please show the CHEMOTHERAPY ALERT CARD or IMMUNOTHERAPY ALERT CARD at check-in to  the Emergency Department and triage nurse.  Should you have questions after your visit or need to cancel or reschedule your appointment, please contact [facility]  Dept: [phone]  and follow the prompts.  Office hours are 8:00 a.m. to 4:30 p.m. Monday - Friday. Please note that voicemails left after 4:00 p.m. may not be returned until the following business day.  We are closed weekends and major holidays. You have access to a nurse at all times for urgent questions. Please call the main number to the clinic Dept: [phone] and follow the prompts.   For any non-urgent questions, you may also contact your provider using MyChart. We now offer e-Visits for anyone 18 and older to request care online for non-urgent symptoms. For details visit [email or web address].   Also download the MyChart app! Go to the app store, search "MyChart", open the app, select [facility], and log in with your MyChart username and password.  Masks are optional in the cancer centers. If you would like for your care team to wear a mask while they are taking care of you, please let them know. You may have one support person who is at least 50 years old accompany you for your appointments.  Carfilzomib Injection What is this medication? CARFILZOMIB (kar FILZ oh mib) treats multiple myeloma, a type of bone marrow cancer. It works by blocking a protein that causes cancer cells to grow and multiply. This helps to slow or stop the spread of cancer cells. This medicine may be used for other purposes; ask your health care provider or pharmacist   if you have questions. COMMON BRAND NAME(S): KYPROLIS What should I tell my care team before I take this medication? They need to know if you have any of these conditions: Heart disease History of blood clots Irregular heartbeat Kidney disease Liver disease Lung or breathing disease An unusual or allergic reaction to carfilzomib, or other medications,  foods, dyes, or preservatives If you or your partner are pregnant or trying to get pregnant Breastfeeding How should I use this medication? This medication is injected into a vein. It is given by your care team in a hospital or clinic setting. Talk to your care team about the use of this medication in children. Special care may be needed. Overdosage: If you think you have taken too much of this medicine contact a poison control center or emergency room at once. NOTE: This medicine is only for you. Do not share this medicine with others. What if I miss a dose? Keep appointments for follow-up doses. It is important not to miss your dose. Call your care team if you are unable to keep an appointment. What may interact with this medication? Interactions are not expected. This list may not describe all possible interactions. Give your health care provider a list of all the medicines, herbs, non-prescription drugs, or dietary supplements you use. Also tell them if you smoke, drink alcohol, or use illegal drugs. Some items may interact with your medicine. What should I watch for while using this medication? Your condition will be monitored carefully while you are receiving this medication. You may need blood work while taking this medication. Check with your care team if you have severe diarrhea, nausea, and vomiting, or if you sweat a lot. The loss of too much body fluid may make it dangerous for you to take this medication. This medication may affect your coordination, reaction time, or judgment. Do not drive or operate machinery until you know how this medication affects you. Sit up or stand slowly to reduce the risk of dizzy or fainting spells. Drinking alcohol with this medication can increase the risk of these side effects. Talk to your care team if you may be pregnant. Serious birth defects can occur if you take this medication during pregnancy and for 6 months after the last dose. You will need a  negative pregnancy test before starting this medication. Contraception is recommended while taking this medication and for 6 months after the last dose. Your care team can help you find an option that works for you. If your partner can get pregnant, use a condom during sex while taking this medication and for 3 months after the last dose. Do not breastfeed while taking this medication and for 2 weeks after the last dose. This medication may cause infertility. Talk to your care team if you are concerned about your fertility. What side effects may I notice from receiving this medication? Side effects that you should report to your care team as soon as possible: Allergic reactions--skin rash, itching, hives, swelling of the face, lips, tongue, or throat Bleeding--bloody or black, tar-like stools, vomiting blood or brown material that looks like coffee grounds, red or dark brown urine, small red or purple spots on skin, unusual bruising or bleeding Blood clot--pain, swelling, or warmth in the leg, shortness of breath, chest pain Dizziness, loss of balance or coordination, confusion or trouble speaking Heart attack--pain or tightness in the chest, shoulders, arms, or jaw, nausea, shortness of breath, cold or clammy skin, feeling faint or lightheaded Heart failure--shortness  of breath, swelling of the ankles, feet, or hands, sudden weight gain, unusual weakness or fatigue Heart rhythm changes--fast or irregular heartbeat, dizziness, feeling faint or lightheaded, chest pain, trouble breathing Increase in blood pressure Infection--fever, chills, cough, sore throat, wounds that don't heal, pain or trouble when passing urine, general feeling of discomfort or being unwell Infusion reactions--chest pain, shortness of breath or trouble breathing, feeling faint or lightheaded Kidney injury--decrease in the amount of urine, swelling of the ankles, hands, or feet Liver injury--right upper belly pain, loss of  appetite, nausea, light-colored stool, dark yellow or brown urine, yellowing skin or eyes, unusual weakness or fatigue Lung injury--shortness of breath or trouble breathing, cough, spitting up blood, chest pain, fever Pulmonary hypertension--shortness of breath, chest pain, fast or irregular heartbeat, feeling faint or lightheaded, fatigue, swelling of the ankles or feet Stomach pain, bloody diarrhea, pale skin, unusual weakness or fatigue, decrease in the amount of urine, which may be signs of hemolytic uremic syndrome Sudden and severe headache, confusion, change in vision, seizures, which may be signs of posterior reversible encephalopathy syndrome (PRES) TTP--purple spots on the skin or inside the mouth, pale skin, yellowing skin or eyes, unusual weakness or fatigue, fever, fast or irregular heartbeat, confusion, change in vision, trouble speaking, trouble walking Tumor lysis syndrome (TLS)--nausea, vomiting, diarrhea, decrease in the amount of urine, dark urine, unusual weakness or fatigue, confusion, muscle pain or cramps, fast or irregular heartbeat, joint pain Side effects that usually do not require medical attention (report to your care team if they continue or are bothersome): Diarrhea Fatigue Nausea Trouble sleeping This list may not describe all possible side effects. Call your doctor for medical advice about side effects. You may report side effects to FDA at 1-800-FDA-1088. Where should I keep my medication? This medication is given in a hospital or clinic. It will not be stored at home. NOTE: This sheet is a summary. It may not cover all possible information. If you have questions about this medicine, talk to your doctor, pharmacist, or health care provider.  2023 Elsevier/Gold Standard (2021-08-03 00:00:00)  

## 2022-03-15 NOTE — Progress Notes (Signed)
Patient identified at risk for malnutrition on the MST report secondary to weight loss and poor appetite.  Met with patient briefly in infusion. Patient diagnosed with multiple myeloma and completed another treatment with carfilzomib/Decadron on 03/06/2022.   Weight improved today to 176 # 3 oz from 173 # 9.6 oz.  Patient denies poor appetite and reports good oral intake. States everything tastes like "burnt hair". He has found that if he increases spices, food tastes a little better. No other nutrition impact symptoms.  Nutrition Diagnosis: Food and Nutrition Related Knowledge Deficit related to multiple myeloma and associated treatments as evidenced by no prior need for nutrition information.  Intervention: Educated on strategies to improve taste alterations. Provided nutrition fact sheet. Discouraged intentional weight loss during treatment. Provided contact information.  Monitoring, Evaluation, Goals: Patient will tolerate adequate calories and protein to maintain lean body mass.  No follow up needed at this time. Nutrition diagnosis resolved.

## 2022-03-15 NOTE — Progress Notes (Signed)
Patient seen by Dr. Benay Spice today  Vitals are within treatment parameters.  Labs reviewed by Dr. Benay Spice and are within treatment parameters.  Per physician team, patient is ready for treatment and there are NO modifications to the treatment plan. Zometa is not due today.

## 2022-03-16 ENCOUNTER — Encounter: Payer: Self-pay | Admitting: *Deleted

## 2022-03-21 ENCOUNTER — Inpatient Hospital Stay: Payer: BLUE CROSS/BLUE SHIELD | Attending: Oncology

## 2022-03-21 ENCOUNTER — Inpatient Hospital Stay: Payer: BLUE CROSS/BLUE SHIELD

## 2022-03-21 VITALS — BP 115/79 | HR 70 | Temp 98.1°F | Resp 18 | Ht 68.0 in | Wt 178.6 lb

## 2022-03-21 DIAGNOSIS — D649 Anemia, unspecified: Secondary | ICD-10-CM | POA: Diagnosis not present

## 2022-03-21 DIAGNOSIS — C9000 Multiple myeloma not having achieved remission: Secondary | ICD-10-CM

## 2022-03-21 DIAGNOSIS — J4489 Other specified chronic obstructive pulmonary disease: Secondary | ICD-10-CM | POA: Diagnosis not present

## 2022-03-21 DIAGNOSIS — Z5112 Encounter for antineoplastic immunotherapy: Secondary | ICD-10-CM | POA: Diagnosis present

## 2022-03-21 DIAGNOSIS — D696 Thrombocytopenia, unspecified: Secondary | ICD-10-CM | POA: Diagnosis not present

## 2022-03-21 DIAGNOSIS — Z79899 Other long term (current) drug therapy: Secondary | ICD-10-CM | POA: Insufficient documentation

## 2022-03-21 LAB — CBC WITH DIFFERENTIAL (CANCER CENTER ONLY)
Abs Immature Granulocytes: 0.06 10*3/uL (ref 0.00–0.07)
Basophils Absolute: 0 10*3/uL (ref 0.0–0.1)
Basophils Relative: 0 %
Eosinophils Absolute: 0.1 10*3/uL (ref 0.0–0.5)
Eosinophils Relative: 1 %
HCT: 40.3 % (ref 39.0–52.0)
Hemoglobin: 14 g/dL (ref 13.0–17.0)
Immature Granulocytes: 1 %
Lymphocytes Relative: 17 %
Lymphs Abs: 1.6 10*3/uL (ref 0.7–4.0)
MCH: 34.3 pg — ABNORMAL HIGH (ref 26.0–34.0)
MCHC: 34.7 g/dL (ref 30.0–36.0)
MCV: 98.8 fL (ref 80.0–100.0)
Monocytes Absolute: 0.8 10*3/uL (ref 0.1–1.0)
Monocytes Relative: 8 %
Neutro Abs: 7.3 10*3/uL (ref 1.7–7.7)
Neutrophils Relative %: 73 %
Platelet Count: 157 10*3/uL (ref 150–400)
RBC: 4.08 MIL/uL — ABNORMAL LOW (ref 4.22–5.81)
RDW: 14.7 % (ref 11.5–15.5)
WBC Count: 9.8 10*3/uL (ref 4.0–10.5)
nRBC: 0 % (ref 0.0–0.2)

## 2022-03-21 LAB — CMP (CANCER CENTER ONLY)
ALT: 10 U/L (ref 0–44)
AST: 11 U/L — ABNORMAL LOW (ref 15–41)
Albumin: 4.3 g/dL (ref 3.5–5.0)
Alkaline Phosphatase: 57 U/L (ref 38–126)
Anion gap: 12 (ref 5–15)
BUN: 19 mg/dL (ref 6–20)
CO2: 23 mmol/L (ref 22–32)
Calcium: 10.1 mg/dL (ref 8.9–10.3)
Chloride: 99 mmol/L (ref 98–111)
Creatinine: 1.2 mg/dL (ref 0.61–1.24)
GFR, Estimated: >60 mL/min (ref >60)
Glucose, Bld: 130 mg/dL — ABNORMAL HIGH (ref 70–99)
Potassium: 3.9 mmol/L (ref 3.5–5.1)
Sodium: 134 mmol/L — ABNORMAL LOW (ref 135–145)
Total Bilirubin: 0.4 mg/dL (ref 0.3–1.2)
Total Protein: 6.7 g/dL (ref 6.5–8.1)

## 2022-03-21 LAB — PHOSPHORUS: Phosphorus: 3.3 mg/dL (ref 2.5–4.6)

## 2022-03-21 MED ORDER — DEXTROSE 5 % IV SOLN
56.0000 mg/m2 | Freq: Once | INTRAVENOUS | Status: AC
  Administered 2022-03-21: 110 mg via INTRAVENOUS
  Filled 2022-03-21: qty 10

## 2022-03-21 MED ORDER — SODIUM CHLORIDE 0.9 % IV SOLN: Freq: Once | INTRAVENOUS | Status: AC

## 2022-03-21 MED ORDER — DEXAMETHASONE 4 MG PO TABS: 20.0000 mg | ORAL_TABLET | Freq: Once | ORAL | Status: DC

## 2022-03-21 NOTE — Patient Instructions (Signed)
[place]   Discharge Instructions: Thank you for choosing [facility] to provide your oncology and hematology care.   If you have a lab appointment with the [facility], please go directly to the [facility] and check in at the registration area.   Wear comfortable clothing and clothing appropriate for easy access to any Portacath or PICC line.   We strive to give you quality time with your provider. You may need to reschedule your appointment if you arrive late (15 or more minutes).  Arriving late affects you and other patients whose appointments are after yours.  Also, if you miss three or more appointments without notifying the office, you may be dismissed from the clinic at the provider's discretion.      For prescription refill requests, have your pharmacy contact our office and allow 72 hours for refills to be completed.    Today you received the following chemotherapy and/or immunotherapy agents Kyprolis.      To help prevent nausea and vomiting after your treatment, we encourage you to take your nausea medication as directed.  BELOW ARE SYMPTOMS THAT SHOULD BE REPORTED IMMEDIATELY: *FEVER GREATER THAN 100.4 F (38 C) OR HIGHER *CHILLS OR SWEATING *NAUSEA AND VOMITING THAT IS NOT CONTROLLED WITH YOUR NAUSEA MEDICATION *UNUSUAL SHORTNESS OF BREATH *UNUSUAL BRUISING OR BLEEDING *URINARY PROBLEMS (pain or burning when urinating, or frequent urination) *BOWEL PROBLEMS (unusual diarrhea, constipation, pain near the anus) TENDERNESS IN MOUTH AND THROAT WITH OR WITHOUT PRESENCE OF ULCERS (sore throat, sores in mouth, or a toothache) UNUSUAL RASH, SWELLING OR PAIN  UNUSUAL VAGINAL DISCHARGE OR ITCHING   Items with * indicate a potential emergency and should be followed up as soon as possible or go to the Emergency Department if any problems should occur.  Please show the CHEMOTHERAPY ALERT CARD or IMMUNOTHERAPY ALERT CARD at check-in to  the Emergency Department and triage nurse.  Should you have questions after your visit or need to cancel or reschedule your appointment, please contact [facility]  Dept: [phone]  and follow the prompts.  Office hours are 8:00 a.m. to 4:30 p.m. Monday - Friday. Please note that voicemails left after 4:00 p.m. may not be returned until the following business day.  We are closed weekends and major holidays. You have access to a nurse at all times for urgent questions. Please call the main number to the clinic Dept: [phone] and follow the prompts.   For any non-urgent questions, you may also contact your provider using MyChart. We now offer e-Visits for anyone 64 and older to request care online for non-urgent symptoms. For details visit [email or web address].   Also download the MyChart app! Go to the app store, search "MyChart", open the app, select , and log in with your MyChart username and password.  Carfilzomib Injection What is this medication? CARFILZOMIB (kar FILZ oh mib) treats multiple myeloma, a type of bone marrow cancer. It works by blocking a protein that causes cancer cells to grow and multiply. This helps to slow or stop the spread of cancer cells. This medicine may be used for other purposes; ask your health care provider or pharmacist if you have questions. COMMON BRAND NAME(S): KYPROLIS What should I tell my care team before I take this medication? They need to know if you have any of these conditions: Heart disease History of blood clots Irregular heartbeat Kidney disease Liver disease Lung or breathing disease An unusual  or allergic reaction to carfilzomib, or other medications, foods, dyes, or preservatives If you or your partner are pregnant or trying to get pregnant Breastfeeding How should I use this medication? This medication is injected into a vein. It is given by your care team in a hospital or clinic  setting. Talk to your care team about the use of this medication in children. Special care may be needed. Overdosage: If you think you have taken too much of this medicine contact a poison control center or emergency room at once. NOTE: This medicine is only for you. Do not share this medicine with others. What if I miss a dose? Keep appointments for follow-up doses. It is important not to miss your dose. Call your care team if you are unable to keep an appointment. What may interact with this medication? Interactions are not expected. This list may not describe all possible interactions. Give your health care provider a list of all the medicines, herbs, non-prescription drugs, or dietary supplements you use. Also tell them if you smoke, drink alcohol, or use illegal drugs. Some items may interact with your medicine. What should I watch for while using this medication? Your condition will be monitored carefully while you are receiving this medication. You may need blood work while taking this medication. Check with your care team if you have severe diarrhea, nausea, and vomiting, or if you sweat a lot. The loss of too much body fluid may make it dangerous for you to take this medication. This medication may affect your coordination, reaction time, or judgment. Do not drive or operate machinery until you know how this medication affects you. Sit up or stand slowly to reduce the risk of dizzy or fainting spells. Drinking alcohol with this medication can increase the risk of these side effects. Talk to your care team if you may be pregnant. Serious birth defects can occur if you take this medication during pregnancy and for 6 months after the last dose. You will need a negative pregnancy test before starting this medication. Contraception is recommended while taking this medication and for 6 months after the last dose. Your care team can help you find an option that works for you. If your partner can get  pregnant, use a condom during sex while taking this medication and for 3 months after the last dose. Do not breastfeed while taking this medication and for 2 weeks after the last dose. This medication may cause infertility. Talk to your care team if you are concerned about your fertility. What side effects may I notice from receiving this medication? Side effects that you should report to your care team as soon as possible: Allergic reactions--skin rash, itching, hives, swelling of the face, lips, tongue, or throat Bleeding--bloody or black, tar-like stools, vomiting blood or brown material that looks like coffee grounds, red or dark brown urine, small red or purple spots on skin, unusual bruising or bleeding Blood clot--pain, swelling, or warmth in the leg, shortness of breath, chest pain Dizziness, loss of balance or coordination, confusion or trouble speaking Heart attack--pain or tightness in the chest, shoulders, arms, or jaw, nausea, shortness of breath, cold or clammy skin, feeling faint or lightheaded Heart failure--shortness of breath, swelling of the ankles, feet, or hands, sudden weight gain, unusual weakness or fatigue Heart rhythm changes--fast or irregular heartbeat, dizziness, feeling faint or lightheaded, chest pain, trouble breathing Increase in blood pressure Infection--fever, chills, cough, sore throat, wounds that don't heal, pain or trouble when passing  urine, general feeling of discomfort or being unwell Infusion reactions--chest pain, shortness of breath or trouble breathing, feeling faint or lightheaded Kidney injury--decrease in the amount of urine, swelling of the ankles, hands, or feet Liver injury--right upper belly pain, loss of appetite, nausea, light-colored stool, dark yellow or brown urine, yellowing skin or eyes, unusual weakness or fatigue Lung injury--shortness of breath or trouble breathing, cough, spitting up blood, chest pain, fever Pulmonary  hypertension--shortness of breath, chest pain, fast or irregular heartbeat, feeling faint or lightheaded, fatigue, swelling of the ankles or feet Stomach pain, bloody diarrhea, pale skin, unusual weakness or fatigue, decrease in the amount of urine, which may be signs of hemolytic uremic syndrome Sudden and severe headache, confusion, change in vision, seizures, which may be signs of posterior reversible encephalopathy syndrome (PRES) TTP--purple spots on the skin or inside the mouth, pale skin, yellowing skin or eyes, unusual weakness or fatigue, fever, fast or irregular heartbeat, confusion, change in vision, trouble speaking, trouble walking Tumor lysis syndrome (TLS)--nausea, vomiting, diarrhea, decrease in the amount of urine, dark urine, unusual weakness or fatigue, confusion, muscle pain or cramps, fast or irregular heartbeat, joint pain Side effects that usually do not require medical attention (report to your care team if they continue or are bothersome): Diarrhea Fatigue Nausea Trouble sleeping This list may not describe all possible side effects. Call your doctor for medical advice about side effects. You may report side effects to FDA at 1-800-FDA-1088. Where should I keep my medication? This medication is given in a hospital or clinic. It will not be stored at home. NOTE: This sheet is a summary. It may not cover all possible information. If you have questions about this medicine, talk to your doctor, pharmacist, or health care provider.  2023 Elsevier/Gold Standard (2021-08-03 00:00:00)

## 2022-03-22 ENCOUNTER — Inpatient Hospital Stay: Payer: BLUE CROSS/BLUE SHIELD

## 2022-03-29 ENCOUNTER — Inpatient Hospital Stay: Payer: BLUE CROSS/BLUE SHIELD

## 2022-03-29 ENCOUNTER — Inpatient Hospital Stay: Payer: BLUE CROSS/BLUE SHIELD | Admitting: Oncology

## 2022-04-01 ENCOUNTER — Other Ambulatory Visit: Payer: Self-pay | Admitting: Oncology

## 2022-04-04 ENCOUNTER — Inpatient Hospital Stay: Payer: BLUE CROSS/BLUE SHIELD

## 2022-04-04 ENCOUNTER — Encounter: Payer: Self-pay | Admitting: *Deleted

## 2022-04-04 ENCOUNTER — Other Ambulatory Visit: Payer: BLUE CROSS/BLUE SHIELD

## 2022-04-04 ENCOUNTER — Inpatient Hospital Stay (HOSPITAL_BASED_OUTPATIENT_CLINIC_OR_DEPARTMENT_OTHER): Payer: BLUE CROSS/BLUE SHIELD | Admitting: Nurse Practitioner

## 2022-04-04 ENCOUNTER — Encounter: Payer: Self-pay | Admitting: Nurse Practitioner

## 2022-04-04 VITALS — BP 115/76 | HR 100 | Temp 98.1°F | Resp 18 | Ht 68.0 in | Wt 180.6 lb

## 2022-04-04 VITALS — BP 132/84 | HR 89 | Resp 20

## 2022-04-04 DIAGNOSIS — C9000 Multiple myeloma not having achieved remission: Secondary | ICD-10-CM | POA: Diagnosis not present

## 2022-04-04 LAB — CMP (CANCER CENTER ONLY)
ALT: 10 U/L (ref 0–44)
AST: 10 U/L — ABNORMAL LOW (ref 15–41)
Albumin: 4.5 g/dL (ref 3.5–5.0)
Alkaline Phosphatase: 59 U/L (ref 38–126)
Anion gap: 11 (ref 5–15)
BUN: 22 mg/dL — ABNORMAL HIGH (ref 6–20)
CO2: 23 mmol/L (ref 22–32)
Calcium: 10.9 mg/dL — ABNORMAL HIGH (ref 8.9–10.3)
Chloride: 102 mmol/L (ref 98–111)
Creatinine: 1.1 mg/dL (ref 0.61–1.24)
GFR, Estimated: >60 mL/min (ref >60)
Glucose, Bld: 131 mg/dL — ABNORMAL HIGH (ref 70–99)
Potassium: 3.9 mmol/L (ref 3.5–5.1)
Sodium: 136 mmol/L (ref 135–145)
Total Bilirubin: 0.4 mg/dL (ref 0.3–1.2)
Total Protein: 7.1 g/dL (ref 6.5–8.1)

## 2022-04-04 LAB — CBC WITH DIFFERENTIAL (CANCER CENTER ONLY)
Abs Immature Granulocytes: 0.07 10*3/uL (ref 0.00–0.07)
Basophils Absolute: 0 10*3/uL (ref 0.0–0.1)
Basophils Relative: 0 %
Eosinophils Absolute: 0 10*3/uL (ref 0.0–0.5)
Eosinophils Relative: 0 %
HCT: 43 % (ref 39.0–52.0)
Hemoglobin: 14.9 g/dL (ref 13.0–17.0)
Immature Granulocytes: 1 %
Lymphocytes Relative: 7 %
Lymphs Abs: 0.9 10*3/uL (ref 0.7–4.0)
MCH: 34.6 pg — ABNORMAL HIGH (ref 26.0–34.0)
MCHC: 34.7 g/dL (ref 30.0–36.0)
MCV: 99.8 fL (ref 80.0–100.0)
Monocytes Absolute: 0.4 10*3/uL (ref 0.1–1.0)
Monocytes Relative: 4 %
Neutro Abs: 11.2 10*3/uL — ABNORMAL HIGH (ref 1.7–7.7)
Neutrophils Relative %: 88 %
Platelet Count: 289 10*3/uL (ref 150–400)
RBC: 4.31 MIL/uL (ref 4.22–5.81)
RDW: 14.3 % (ref 11.5–15.5)
WBC Count: 12.7 10*3/uL — ABNORMAL HIGH (ref 4.0–10.5)
nRBC: 0 % (ref 0.0–0.2)

## 2022-04-04 LAB — PHOSPHORUS: Phosphorus: 2.7 mg/dL (ref 2.5–4.6)

## 2022-04-04 MED ORDER — DEXTROSE 5 % IV SOLN
56.0000 mg/m2 | Freq: Once | INTRAVENOUS | Status: AC
  Administered 2022-04-04: 110 mg via INTRAVENOUS
  Filled 2022-04-04: qty 30

## 2022-04-04 MED ORDER — LORAZEPAM 0.5 MG PO TABS: ORAL_TABLET | ORAL | 0 refills | Status: DC

## 2022-04-04 MED ORDER — OXYCODONE-ACETAMINOPHEN 5-325 MG PO TABS: 1.0000 | ORAL_TABLET | Freq: Four times a day (QID) | ORAL | 0 refills | Status: DC | PRN

## 2022-04-04 MED ORDER — SODIUM CHLORIDE 0.9 % IV SOLN: Freq: Once | INTRAVENOUS | Status: AC

## 2022-04-04 NOTE — Patient Instructions (Signed)
[place]   Discharge Instructions: Thank you for choosing [facility] to provide your oncology and hematology care.   If you have a lab appointment with the [facility], please go directly to the [facility] and check in at the registration area.   Wear comfortable clothing and clothing appropriate for easy access to any Portacath or PICC line.   We strive to give you quality time with your provider. You may need to reschedule your appointment if you arrive late (15 or more minutes).  Arriving late affects you and other patients whose appointments are after yours.  Also, if you miss three or more appointments without notifying the office, you may be dismissed from the clinic at the provider's discretion.      For prescription refill requests, have your pharmacy contact our office and allow 72 hours for refills to be completed.    Today you received the following chemotherapy and/or immunotherapy agents Carfilzomib (KYPROLIS).      To help prevent nausea and vomiting after your treatment, we encourage you to take your nausea medication as directed.  BELOW ARE SYMPTOMS THAT SHOULD BE REPORTED IMMEDIATELY: *FEVER GREATER THAN 100.4 F (38 C) OR HIGHER *CHILLS OR SWEATING *NAUSEA AND VOMITING THAT IS NOT CONTROLLED WITH YOUR NAUSEA MEDICATION *UNUSUAL SHORTNESS OF BREATH *UNUSUAL BRUISING OR BLEEDING *URINARY PROBLEMS (pain or burning when urinating, or frequent urination) *BOWEL PROBLEMS (unusual diarrhea, constipation, pain near the anus) TENDERNESS IN MOUTH AND THROAT WITH OR WITHOUT PRESENCE OF ULCERS (sore throat, sores in mouth, or a toothache) UNUSUAL RASH, SWELLING OR PAIN  UNUSUAL VAGINAL DISCHARGE OR ITCHING   Items with * indicate a potential emergency and should be followed up as soon as possible or go to the Emergency Department if any problems should occur.  Please show the CHEMOTHERAPY ALERT CARD or IMMUNOTHERAPY ALERT CARD at  check-in to the Emergency Department and triage nurse.  Should you have questions after your visit or need to cancel or reschedule your appointment, please contact [facility]  Dept: [phone]  and follow the prompts.  Office hours are 8:00 a.m. to 4:30 p.m. Monday - Friday. Please note that voicemails left after 4:00 p.m. may not be returned until the following business day.  We are closed weekends and major holidays. You have access to a nurse at all times for urgent questions. Please call the main number to the clinic Dept: [phone] and follow the prompts.   For any non-urgent questions, you may also contact your provider using MyChart. We now offer e-Visits for anyone 60 and older to request care online for non-urgent symptoms. For details visit [email or web address].   Also download the MyChart app! Go to the app store, search "MyChart", open the app, select [facility], and log in with your MyChart username and password.  Carfilzomib Injection What is this medication? CARFILZOMIB (kar FILZ oh mib) treats multiple myeloma, a type of bone marrow cancer. It works by blocking a protein that causes cancer cells to grow and multiply. This helps to slow or stop the spread of cancer cells. This medicine may be used for other purposes; ask your health care provider or pharmacist if you have questions. COMMON BRAND NAME(S): KYPROLIS What should I tell my care team before I take this medication? They need to know if you have any of these conditions: Heart disease History of blood clots Irregular heartbeat Kidney disease Liver disease Lung or breathing disease An  unusual or allergic reaction to carfilzomib, or other medications, foods, dyes, or preservatives If you or your partner are pregnant or trying to get pregnant Breastfeeding How should I use this medication? This medication is injected into a vein. It is given by your care team in a hospital or clinic  setting. Talk to your care team about the use of this medication in children. Special care may be needed. Overdosage: If you think you have taken too much of this medicine contact a poison control center or emergency room at once. NOTE: This medicine is only for you. Do not share this medicine with others. What if I miss a dose? Keep appointments for follow-up doses. It is important not to miss your dose. Call your care team if you are unable to keep an appointment. What may interact with this medication? Interactions are not expected. This list may not describe all possible interactions. Give your health care provider a list of all the medicines, herbs, non-prescription drugs, or dietary supplements you use. Also tell them if you smoke, drink alcohol, or use illegal drugs. Some items may interact with your medicine. What should I watch for while using this medication? Your condition will be monitored carefully while you are receiving this medication. You may need blood work while taking this medication. Check with your care team if you have severe diarrhea, nausea, and vomiting, or if you sweat a lot. The loss of too much body fluid may make it dangerous for you to take this medication. This medication may affect your coordination, reaction time, or judgment. Do not drive or operate machinery until you know how this medication affects you. Sit up or stand slowly to reduce the risk of dizzy or fainting spells. Drinking alcohol with this medication can increase the risk of these side effects. Talk to your care team if you may be pregnant. Serious birth defects can occur if you take this medication during pregnancy and for 6 months after the last dose. You will need a negative pregnancy test before starting this medication. Contraception is recommended while taking this medication and for 6 months after the last dose. Your care team can help you find an option that works for you. If your partner can get  pregnant, use a condom during sex while taking this medication and for 3 months after the last dose. Do not breastfeed while taking this medication and for 2 weeks after the last dose. This medication may cause infertility. Talk to your care team if you are concerned about your fertility. What side effects may I notice from receiving this medication? Side effects that you should report to your care team as soon as possible: Allergic reactions--skin rash, itching, hives, swelling of the face, lips, tongue, or throat Bleeding--bloody or black, tar-like stools, vomiting blood or brown material that looks like coffee grounds, red or dark brown urine, small red or purple spots on skin, unusual bruising or bleeding Blood clot--pain, swelling, or warmth in the leg, shortness of breath, chest pain Dizziness, loss of balance or coordination, confusion or trouble speaking Heart attack--pain or tightness in the chest, shoulders, arms, or jaw, nausea, shortness of breath, cold or clammy skin, feeling faint or lightheaded Heart failure--shortness of breath, swelling of the ankles, feet, or hands, sudden weight gain, unusual weakness or fatigue Heart rhythm changes--fast or irregular heartbeat, dizziness, feeling faint or lightheaded, chest pain, trouble breathing Increase in blood pressure Infection--fever, chills, cough, sore throat, wounds that don't heal, pain or trouble when  passing urine, general feeling of discomfort or being unwell Infusion reactions--chest pain, shortness of breath or trouble breathing, feeling faint or lightheaded Kidney injury--decrease in the amount of urine, swelling of the ankles, hands, or feet Liver injury--right upper belly pain, loss of appetite, nausea, light-colored stool, dark yellow or brown urine, yellowing skin or eyes, unusual weakness or fatigue Lung injury--shortness of breath or trouble breathing, cough, spitting up blood, chest pain, fever Pulmonary  hypertension--shortness of breath, chest pain, fast or irregular heartbeat, feeling faint or lightheaded, fatigue, swelling of the ankles or feet Stomach pain, bloody diarrhea, pale skin, unusual weakness or fatigue, decrease in the amount of urine, which may be signs of hemolytic uremic syndrome Sudden and severe headache, confusion, change in vision, seizures, which may be signs of posterior reversible encephalopathy syndrome (PRES) TTP--purple spots on the skin or inside the mouth, pale skin, yellowing skin or eyes, unusual weakness or fatigue, fever, fast or irregular heartbeat, confusion, change in vision, trouble speaking, trouble walking Tumor lysis syndrome (TLS)--nausea, vomiting, diarrhea, decrease in the amount of urine, dark urine, unusual weakness or fatigue, confusion, muscle pain or cramps, fast or irregular heartbeat, joint pain Side effects that usually do not require medical attention (report to your care team if they continue or are bothersome): Diarrhea Fatigue Nausea Trouble sleeping This list may not describe all possible side effects. Call your doctor for medical advice about side effects. You may report side effects to FDA at 1-800-FDA-1088. Where should I keep my medication? This medication is given in a hospital or clinic. It will not be stored at home. NOTE: This sheet is a summary. It may not cover all possible information. If you have questions about this medicine, talk to your doctor, pharmacist, or health care provider.  2023 Elsevier/Gold Standard (2021-08-03 00:00:00)

## 2022-04-04 NOTE — Progress Notes (Signed)
Patient seen by Lisa Thomas, NP today  Vitals are within treatment parameters.   Labs reviewed by Lisa Thomas, NP and are within treatment parameters.  Per physician team, patient is ready for treatment and there are NO modifications to the treatment plan. 

## 2022-04-04 NOTE — Progress Notes (Signed)
  [facility] OFFICE PROGRESS NOTE   Diagnosis: Multiple myeloma  INTERVAL HISTORY:   Jordan King returns as scheduled.  He continues venetoclax.  Current Carfilzomib schedule is 3 out of 4 weeks.  He overall feels well.  No nausea vomiting.  No mouth sores.  No diarrhea.  He continues to note mild dyspnea for about an hour after each treatment.  He feels bronchitis has resolved.  Objective:  Vital signs in last 24 hours:  Blood pressure 115/76, pulse 100, temperature 98.1 F (36.7 C), temperature source Oral, resp. rate 18, height '5\' 8"'$  (1.727 m), weight 180 lb 9.6 oz (81.9 kg), SpO2 96 %.    HEENT: No thrush. Resp: Lungs clear bilaterally. Cardio: Regular rate and rhythm. GI: Abdomen soft and nontender.  No hepatosplenomegaly. Vascular: No leg edema.   Lab Results:  Lab Results  Component Value Date   WBC 12.7 (H) 04/04/2022   HGB 14.9 04/04/2022   HCT 43.0 04/04/2022   MCV 99.8 04/04/2022   PLT 289 04/04/2022   NEUTROABS 11.2 (H) 04/04/2022    Imaging:  No results found.  Medications: I have reviewed the patient's current medications.  Assessment/Plan: Multiple myeloma -05/22/2021-kappa free light chain 11.3, lambda free light chain 17,028 -25 g proteinuria, 20 g lambda light chains -Serum immunofixation 05/22/2021-monoclonal lambda light chains -Bone marrow biopsy 05/23/2021-hypercellular marrow involved by plasma cell myeloma, 80-90% plasma cells on the bone marrow biopsy, diminished iron stains -Cytogenetics-45 X, minus Y, t11:14 (11 q13) -Cycle 1 Velcade/Decadron 05/23/2021 (Decadron given 05/22/2021) -Cycle 1 DRVd 06/13/2021, treatment held 06/20/2021 due to a rash, Revlimid resumed 06/28/2021, daratumumab resumed 07/04/2021 Revlimid resumed 06/28/2021 Cycle 2 daratumumab/Revlimid/Decadron/Velcade 07/18/2021 (Revlimid started 07/19/2021, placed on hold 07/25/2021 secondary to a rash-last taken 07/24/2021) Treatment continued with weekly Velcade/Decadron and  daratumumab Treatment changed to pomalidomide, daratumumab, Decadron 10/03/2021 Pomalidomide placed on hold 10/17/2021 due to a rash Pomalidomide resumed at a reduced dose of 2 mg daily for 21 days followed by 7-day break 10/31/2021 Pomalidomide 2 mg daily for 21 days beginning 11/28/2021 Lambda light chains increased 12/12/2021 Venetoclax/Carfilzomib/Decadron 12/26/2021, venetoclax started 01/05/2022 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 CARFILZOMIB/Decadron weekly x3 beginning 01/16/2022 Venetoclax resumed 01/19/2022 100 mg daily Carfilzomib/Decadron weekly x 3 beginning 02/13/2022 (day 15 not given) Carfilzomib /Decadron weekly x 3 beginning 03/06/2022 Carfilzomib/Decadron weekly x 3 beginning 04/04/2022   Renal failure secondary to #1-improved Hypercalcemia-status post treat with calcitonin, pamidronate 05/22/2021-resolved Lytic bone lesions Anemia Thrombocytopenia Hypocalcemia-IV calcium gluconate 05/30/2021, resolved Rash 06/20/2021,  consistent with a drug rash, recurrent rash 07/24/2021-Revlimid placed on hold; recurrent rash 10/17/2021 Admission 01/09/2022 with renal failure, elevated uric acid, rasburicase 01/09/2022 Wheezing/cough-likely secondary to COPD and asthma  Disposition: Jordan King appears stable.  He continues venetoclax.  Current Carfilzomib schedule is 3 out of 4 weeks.  He will begin a new cycle today.  We will follow-up on the light chains.  CBC and chemistry panel reviewed.  Labs adequate to proceed as above.  Calcium is mildly elevated.  We will repeat when he returns next week.  We will see him in follow-up in 2 weeks.  He will contact the office in the interim with any problems.    Ned Card ANP/GNP-BC   04/04/2022  10:51 AM

## 2022-04-05 ENCOUNTER — Other Ambulatory Visit: Payer: Self-pay

## 2022-04-05 LAB — KAPPA/LAMBDA LIGHT CHAINS
Kappa free light chain: 5.1 mg/L (ref 3.3–19.4)
Kappa, lambda light chain ratio: 1.96 — ABNORMAL HIGH (ref 0.26–1.65)
Lambda free light chains: 2.6 mg/L — ABNORMAL LOW (ref 5.7–26.3)

## 2022-04-05 NOTE — Telephone Encounter (Signed)
Left message for patient to call and discuss scheduling the Echocardiogram ordered by Ned Card, NP

## 2022-04-07 ENCOUNTER — Other Ambulatory Visit: Payer: Self-pay

## 2022-04-11 NOTE — Telephone Encounter (Signed)
Left message for patient to call and discuss scheduling the Echocardiogram ordered by Ned Card, NP

## 2022-04-12 ENCOUNTER — Encounter (HOSPITAL_BASED_OUTPATIENT_CLINIC_OR_DEPARTMENT_OTHER): Payer: Self-pay | Admitting: *Deleted

## 2022-04-12 ENCOUNTER — Inpatient Hospital Stay: Payer: BLUE CROSS/BLUE SHIELD

## 2022-04-12 ENCOUNTER — Inpatient Hospital Stay: Payer: BLUE CROSS/BLUE SHIELD | Admitting: Oncology

## 2022-04-12 ENCOUNTER — Encounter: Payer: Self-pay | Admitting: *Deleted

## 2022-04-12 VITALS — BP 122/80 | HR 80 | Resp 18

## 2022-04-12 DIAGNOSIS — C9000 Multiple myeloma not having achieved remission: Secondary | ICD-10-CM

## 2022-04-12 LAB — CBC WITH DIFFERENTIAL (CANCER CENTER ONLY)
Abs Immature Granulocytes: 0.1 10*3/uL — ABNORMAL HIGH (ref 0.00–0.07)
Basophils Absolute: 0 10*3/uL (ref 0.0–0.1)
Basophils Relative: 0 %
Eosinophils Absolute: 0 10*3/uL (ref 0.0–0.5)
Eosinophils Relative: 0 %
HCT: 41.4 % (ref 39.0–52.0)
Hemoglobin: 14.4 g/dL (ref 13.0–17.0)
Immature Granulocytes: 1 %
Lymphocytes Relative: 6 %
Lymphs Abs: 0.8 10*3/uL (ref 0.7–4.0)
MCH: 34.8 pg — ABNORMAL HIGH (ref 26.0–34.0)
MCHC: 34.8 g/dL (ref 30.0–36.0)
MCV: 100 fL (ref 80.0–100.0)
Monocytes Absolute: 0.6 10*3/uL (ref 0.1–1.0)
Monocytes Relative: 5 %
Neutro Abs: 12 10*3/uL — ABNORMAL HIGH (ref 1.7–7.7)
Neutrophils Relative %: 88 %
Platelet Count: 170 10*3/uL (ref 150–400)
RBC: 4.14 MIL/uL — ABNORMAL LOW (ref 4.22–5.81)
RDW: 14.2 % (ref 11.5–15.5)
WBC Count: 13.5 10*3/uL — ABNORMAL HIGH (ref 4.0–10.5)
nRBC: 0 % (ref 0.0–0.2)

## 2022-04-12 LAB — CMP (CANCER CENTER ONLY)
ALT: 10 U/L (ref 0–44)
AST: 7 U/L — ABNORMAL LOW (ref 15–41)
Albumin: 4.3 g/dL (ref 3.5–5.0)
Alkaline Phosphatase: 51 U/L (ref 38–126)
Anion gap: 9 (ref 5–15)
BUN: 19 mg/dL (ref 6–20)
CO2: 27 mmol/L (ref 22–32)
Calcium: 10.5 mg/dL — ABNORMAL HIGH (ref 8.9–10.3)
Chloride: 100 mmol/L (ref 98–111)
Creatinine: 1.14 mg/dL (ref 0.61–1.24)
GFR, Estimated: >60 mL/min (ref >60)
Glucose, Bld: 107 mg/dL — ABNORMAL HIGH (ref 70–99)
Potassium: 4.1 mmol/L (ref 3.5–5.1)
Sodium: 136 mmol/L (ref 135–145)
Total Bilirubin: 0.4 mg/dL (ref 0.3–1.2)
Total Protein: 7.2 g/dL (ref 6.5–8.1)

## 2022-04-12 MED ORDER — ZOLEDRONIC ACID 4 MG/100ML IV SOLN
4.0000 mg | Freq: Once | INTRAVENOUS | Status: AC
  Administered 2022-04-12: 4 mg via INTRAVENOUS
  Filled 2022-04-12: qty 100

## 2022-04-12 MED ORDER — DEXTROSE 5 % IV SOLN
56.0000 mg/m2 | Freq: Once | INTRAVENOUS | Status: AC
  Administered 2022-04-12: 110 mg via INTRAVENOUS
  Filled 2022-04-12: qty 15

## 2022-04-12 MED ORDER — SODIUM CHLORIDE 0.9 % IV SOLN: Freq: Once | INTRAVENOUS | Status: AC

## 2022-04-12 NOTE — Progress Notes (Signed)
  [place] [facility] OFFICE PROGRESS NOTE   Diagnosis: Multiple myeloma  INTERVAL HISTORY:   Jordan King returns as scheduled.  He continues treatment with venetoclax, carfilzomib, and Decadron.  He has not experienced significant dyspnea following the last several cycles of carfilzomib.  No new complaint.  Objective:  Vital signs in last 24 hours:  Blood pressure (!) 128/90, pulse 88, temperature 98.1 F (36.7 C), temperature source Oral, resp. rate 18, height '5\' 8"'$  (1.727 m), weight 178 lb 9.6 oz (81 kg), SpO2 98 %.    HEENT: No thrush Resp: Lungs clear bilaterally Cardio: Regular rate and rhythm GI: No hepatosplenomegaly Vascular: No leg edema     Lab Results:  Lab Results  Component Value Date   WBC 13.5 (H) 04/12/2022   HGB 14.4 04/12/2022   HCT 41.4 04/12/2022   MCV 100.0 04/12/2022   PLT 170 04/12/2022   NEUTROABS 12.0 (H) 04/12/2022    CMP  Lab Results  Component Value Date   NA 136 04/04/2022   K 3.9 04/04/2022   CL 102 04/04/2022   CO2 23 04/04/2022   GLUCOSE 131 (H) 04/04/2022   BUN 22 (H) 04/04/2022   CREATININE 1.10 04/04/2022   CALCIUM 10.9 (H) 04/04/2022   PROT 7.1 04/04/2022   ALBUMIN 4.5 04/04/2022   AST 10 (L) 04/04/2022   ALT 10 04/04/2022   ALKPHOS 59 04/04/2022   BILITOT 0.4 04/04/2022   GFRNONAA >60 04/04/2022     Medications: I have reviewed the patient's current medications.   Assessment/Plan: Multiple myeloma -05/22/2021-kappa free light chain 11.3, lambda free light chain 17,028 -25 g proteinuria, 20 g lambda light chains -Serum immunofixation 05/22/2021-monoclonal lambda light chains -Bone marrow biopsy 05/23/2021-hypercellular marrow involved by plasma cell myeloma, 80-90% plasma cells on the bone marrow biopsy, diminished iron stains -Cytogenetics-45 X, minus Y, t11:14 (11 q13) -Cycle 1 Velcade/Decadron 05/23/2021 (Decadron given 05/22/2021) -Cycle 1 DRVd 06/13/2021, treatment held 06/20/2021 due to a rash, Revlimid resumed  06/28/2021, daratumumab resumed 07/04/2021 Revlimid resumed 06/28/2021 Cycle 2 daratumumab/Revlimid/Decadron/Velcade 07/18/2021 (Revlimid started 07/19/2021, placed on hold 07/25/2021 secondary to a rash-last taken 07/24/2021) Treatment continued with weekly Velcade/Decadron and daratumumab Treatment changed to pomalidomide, daratumumab, Decadron 10/03/2021 Pomalidomide placed on hold 10/17/2021 due to a rash Pomalidomide resumed at a reduced dose of 2 mg daily for 21 days followed by 7-day break 10/31/2021 Pomalidomide 2 mg daily for 21 days beginning 11/28/2021 Lambda light chains increased 12/12/2021 Venetoclax/Carfilzomib/Decadron 12/26/2021, venetoclax started 01/05/2022 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 CARFILZOMIB/Decadron weekly x3 beginning 01/16/2022 Venetoclax resumed 01/19/2022 100 mg daily Carfilzomib/Decadron weekly x 3 beginning 02/13/2022 (day 15 not given) Carfilzomib /Decadron weekly x 3 beginning 03/06/2022 Carfilzomib/Decadron weekly x 3 beginning 04/04/2022   Renal failure secondary to #1-improved Hypercalcemia-status post treat with calcitonin, pamidronate 05/22/2021-resolved Lytic bone lesions Anemia Thrombocytopenia Hypocalcemia-IV calcium gluconate 05/30/2021, resolved Rash 06/20/2021,  consistent with a drug rash, recurrent rash 07/24/2021-Revlimid placed on hold; recurrent rash 10/17/2021 Admission 01/09/2022 with renal failure, elevated uric acid, rasburicase 01/09/2022 Wheezing/cough-likely secondary to COPD and asthma    Disposition: Mr. Bowden appears stable.  He is completing another cycle of carfilzomib/Decadron/venetoclax.  He will return for an office visit prior to the next cycle of treatment beginning 05/02/2022.  He is scheduled to see Dr. Feliciana Rossetti in February to discuss stem cell therapy.  The lambda light chains returned low on 04/04/2022.  Betsy Coder, MD  04/12/2022  11:10 AM

## 2022-04-12 NOTE — Patient Instructions (Signed)
[facility]   Discharge Instructions: Thank you for choosing [facility] to provide your oncology and hematology care.   If you have a lab appointment with the [facility], please go directly to the [facility] and check in at the registration area.   Wear comfortable clothing and clothing appropriate for easy access to any Portacath or PICC line.   We strive to give you quality time with your provider. You may need to reschedule your appointment if you arrive late (15 or more minutes).  Arriving late affects you and other patients whose appointments are after yours.  Also, if you miss three or more appointments without notifying the office, you may be dismissed from the clinic at the provider's discretion.      For prescription refill requests, have your pharmacy contact our office and allow 72 hours for refills to be completed.    Today you received the following chemotherapy and/or immunotherapy agents Kyprolis, Zometa      To help prevent nausea and vomiting after your treatment, we encourage you to take your nausea medication as directed.  BELOW ARE SYMPTOMS THAT SHOULD BE REPORTED IMMEDIATELY: *FEVER GREATER THAN 100.4 F (38 C) OR HIGHER *CHILLS OR SWEATING *NAUSEA AND VOMITING THAT IS NOT CONTROLLED WITH YOUR NAUSEA MEDICATION *UNUSUAL SHORTNESS OF BREATH *UNUSUAL BRUISING OR BLEEDING *URINARY PROBLEMS (pain or burning when urinating, or frequent urination) *BOWEL PROBLEMS (unusual diarrhea, constipation, pain near the anus) TENDERNESS IN MOUTH AND THROAT WITH OR WITHOUT PRESENCE OF ULCERS (sore throat, sores in mouth, or a toothache) UNUSUAL RASH, SWELLING OR PAIN  UNUSUAL VAGINAL DISCHARGE OR ITCHING   Items with * indicate a potential emergency and should be followed up as soon as possible or go to the Emergency Department if any problems should occur.  Please show the CHEMOTHERAPY ALERT CARD or IMMUNOTHERAPY ALERT CARD at  check-in to the Emergency Department and triage nurse.  Should you have questions after your visit or need to cancel or reschedule your appointment, please contact [facility]  Dept: [phone]  and follow the prompts.  Office hours are 8:00 a.m. to 4:30 p.m. Monday - Friday. Please note that voicemails left after 4:00 p.m. may not be returned until the following business day.  We are closed weekends and major holidays. You have access to a nurse at all times for urgent questions. Please call the main number to the clinic Dept: [phone] and follow the prompts.   For any non-urgent questions, you may also contact your provider using MyChart. We now offer e-Visits for anyone 7 and older to request care online for non-urgent symptoms. For details visit [email or web address].   Also download the MyChart app! Go to the app store, search "MyChart", open the app, select [facility], and log in with your MyChart username and password.  Carfilzomib Injection What is this medication? CARFILZOMIB (kar FILZ oh mib) treats multiple myeloma, a type of bone marrow cancer. It works by blocking a protein that causes cancer cells to grow and multiply. This helps to slow or stop the spread of cancer cells. This medicine may be used for other purposes; ask your health care provider or pharmacist if you have questions. COMMON BRAND NAME(S): KYPROLIS What should I tell my care team before I take this medication? They need to know if you have any of these conditions: Heart disease History of blood clots Irregular heartbeat Kidney disease Liver disease Lung or breathing  disease An unusual or allergic reaction to carfilzomib, or other medications, foods, dyes, or preservatives If you or your partner are pregnant or trying to get pregnant Breastfeeding How should I use this medication? This medication is injected into a vein. It is given by your care team in a hospital or  clinic setting. Talk to your care team about the use of this medication in children. Special care may be needed. Overdosage: If you think you have taken too much of this medicine contact a poison control center or emergency room at once. NOTE: This medicine is only for you. Do not share this medicine with others. What if I miss a dose? Keep appointments for follow-up doses. It is important not to miss your dose. Call your care team if you are unable to keep an appointment. What may interact with this medication? Interactions are not expected. This list may not describe all possible interactions. Give your health care provider a list of all the medicines, herbs, non-prescription drugs, or dietary supplements you use. Also tell them if you smoke, drink alcohol, or use illegal drugs. Some items may interact with your medicine. What should I watch for while using this medication? Your condition will be monitored carefully while you are receiving this medication. You may need blood work while taking this medication. Check with your care team if you have severe diarrhea, nausea, and vomiting, or if you sweat a lot. The loss of too much body fluid may make it dangerous for you to take this medication. This medication may affect your coordination, reaction time, or judgment. Do not drive or operate machinery until you know how this medication affects you. Sit up or stand slowly to reduce the risk of dizzy or fainting spells. Drinking alcohol with this medication can increase the risk of these side effects. Talk to your care team if you may be pregnant. Serious birth defects can occur if you take this medication during pregnancy and for 6 months after the last dose. You will need a negative pregnancy test before starting this medication. Contraception is recommended while taking this medication and for 6 months after the last dose. Your care team can help you find an option that works for you. If your partner  can get pregnant, use a condom during sex while taking this medication and for 3 months after the last dose. Do not breastfeed while taking this medication and for 2 weeks after the last dose. This medication may cause infertility. Talk to your care team if you are concerned about your fertility. What side effects may I notice from receiving this medication? Side effects that you should report to your care team as soon as possible: Allergic reactions--skin rash, itching, hives, swelling of the face, lips, tongue, or throat Bleeding--bloody or black, tar-like stools, vomiting blood or brown material that looks like coffee grounds, red or dark brown urine, small red or purple spots on skin, unusual bruising or bleeding Blood clot--pain, swelling, or warmth in the leg, shortness of breath, chest pain Dizziness, loss of balance or coordination, confusion or trouble speaking Heart attack--pain or tightness in the chest, shoulders, arms, or jaw, nausea, shortness of breath, cold or clammy skin, feeling faint or lightheaded Heart failure--shortness of breath, swelling of the ankles, feet, or hands, sudden weight gain, unusual weakness or fatigue Heart rhythm changes--fast or irregular heartbeat, dizziness, feeling faint or lightheaded, chest pain, trouble breathing Increase in blood pressure Infection--fever, chills, cough, sore throat, wounds that don't heal, pain or  trouble when passing urine, general feeling of discomfort or being unwell Infusion reactions--chest pain, shortness of breath or trouble breathing, feeling faint or lightheaded Kidney injury--decrease in the amount of urine, swelling of the ankles, hands, or feet Liver injury--right upper belly pain, loss of appetite, nausea, light-colored stool, dark yellow or brown urine, yellowing skin or eyes, unusual weakness or fatigue Lung injury--shortness of breath or trouble breathing, cough, spitting up blood, chest pain, fever Pulmonary  hypertension--shortness of breath, chest pain, fast or irregular heartbeat, feeling faint or lightheaded, fatigue, swelling of the ankles or feet Stomach pain, bloody diarrhea, pale skin, unusual weakness or fatigue, decrease in the amount of urine, which may be signs of hemolytic uremic syndrome Sudden and severe headache, confusion, change in vision, seizures, which may be signs of posterior reversible encephalopathy syndrome (PRES) TTP--purple spots on the skin or inside the mouth, pale skin, yellowing skin or eyes, unusual weakness or fatigue, fever, fast or irregular heartbeat, confusion, change in vision, trouble speaking, trouble walking Tumor lysis syndrome (TLS)--nausea, vomiting, diarrhea, decrease in the amount of urine, dark urine, unusual weakness or fatigue, confusion, muscle pain or cramps, fast or irregular heartbeat, joint pain Side effects that usually do not require medical attention (report to your care team if they continue or are bothersome): Diarrhea Fatigue Nausea Trouble sleeping This list may not describe all possible side effects. Call your doctor for medical advice about side effects. You may report side effects to FDA at 1-800-FDA-1088. Where should I keep my medication? This medication is given in a hospital or clinic. It will not be stored at home. NOTE: This sheet is a summary. It may not cover all possible information. If you have questions about this medicine, talk to your doctor, pharmacist, or health care provider.  2023 Elsevier/Gold Standard (2021-08-03 00:00:00) Zoledronic Acid Injection (Cancer) What is this medication? ZOLEDRONIC ACID (ZOE le dron ik AS id) treats high calcium levels in the blood caused by cancer. It may also be used with chemotherapy to treat weakened bones caused by cancer. It works by slowing down the release of calcium from bones. This lowers calcium levels in your blood. It also makes your bones stronger and less likely to break  (fracture). It belongs to a group of medications called bisphosphonates. This medicine may be used for other purposes; ask your health care provider or pharmacist if you have questions. COMMON BRAND NAME(S): Zometa, Zometa Powder What should I tell my care team before I take this medication? They need to know if you have any of these conditions: Dehydration Dental disease Kidney disease Liver disease Low levels of calcium in the blood Lung or breathing disease, such as asthma Receiving steroids, such as dexamethasone or prednisone An unusual or allergic reaction to zoledronic acid, other medications, foods, dyes, or preservatives Pregnant or trying to get pregnant Breast-feeding How should I use this medication? This medication is injected into a vein. It is given by your care team in a hospital or clinic setting. Talk to your care team about the use of this medication in children. Special care may be needed. Overdosage: If you think you have taken too much of this medicine contact a poison control center or emergency room at once. NOTE: This medicine is only for you. Do not share this medicine with others. What if I miss a dose? Keep appointments for follow-up doses. It is important not to miss your dose. Call your care team if you are unable to keep an appointment. What  may interact with this medication? Certain antibiotics given by injection Diuretics, such as bumetanide, furosemide NSAIDs, medications for pain and inflammation, such as ibuprofen or naproxen Teriparatide Thalidomide This list may not describe all possible interactions. Give your health care provider a list of all the medicines, herbs, non-prescription drugs, or dietary supplements you use. Also tell them if you smoke, drink alcohol, or use illegal drugs. Some items may interact with your medicine. What should I watch for while using this medication? Visit your care team for regular checks on your progress. It may be  some time before you see the benefit from this medication. Some people who take this medication have severe bone, joint, or muscle pain. This medication may also increase your risk for jaw problems or a broken thigh bone. Tell your care team right away if you have severe pain in your jaw, bones, joints, or muscles. Tell you care team if you have any pain that does not go away or that gets worse. Tell your dentist and dental surgeon that you are taking this medication. You should not have major dental surgery while on this medication. See your dentist to have a dental exam and fix any dental problems before starting this medication. Take good care of your teeth while on this medication. Make sure you see your dentist for regular follow-up appointments. You should make sure you get enough calcium and vitamin D while you are taking this medication. Discuss the foods you eat and the vitamins you take with your care team. Check with your care team if you have severe diarrhea, nausea, and vomiting, or if you sweat a lot. The loss of too much body fluid may make it dangerous for you to take this medication. You may need bloodwork while taking this medication. Talk to your care team if you wish to become pregnant or think you might be pregnant. This medication can cause serious birth defects. What side effects may I notice from receiving this medication? Side effects that you should report to your care team as soon as possible: Allergic reactions--skin rash, itching, hives, swelling of the face, lips, tongue, or throat Kidney injury--decrease in the amount of urine, swelling of the ankles, hands, or feet Low calcium level--muscle pain or cramps, confusion, tingling, or numbness in the hands or feet Osteonecrosis of the jaw--pain, swelling, or redness in the mouth, numbness of the jaw, poor healing after dental work, unusual discharge from the mouth, visible bones in the mouth Severe bone, joint, or muscle  pain Side effects that usually do not require medical attention (report to your care team if they continue or are bothersome): Constipation Fatigue Fever Loss of appetite Nausea Stomach pain This list may not describe all possible side effects. Call your doctor for medical advice about side effects. You may report side effects to FDA at 1-800-FDA-1088. Where should I keep my medication? This medication is given in a hospital or clinic. It will not be stored at home. NOTE: This sheet is a summary. It may not cover all possible information. If you have questions about this medicine, talk to your doctor, pharmacist, or health care provider.  2023 Elsevier/Gold Standard (2021-04-21 00:00:00)

## 2022-04-18 ENCOUNTER — Inpatient Hospital Stay: Payer: BLUE CROSS/BLUE SHIELD

## 2022-04-18 ENCOUNTER — Inpatient Hospital Stay: Payer: BLUE CROSS/BLUE SHIELD | Admitting: Oncology

## 2022-04-18 VITALS — BP 121/86 | HR 88 | Temp 98.0°F | Resp 20 | Ht 68.0 in | Wt 177.4 lb

## 2022-04-18 DIAGNOSIS — C9000 Multiple myeloma not having achieved remission: Secondary | ICD-10-CM | POA: Diagnosis not present

## 2022-04-18 LAB — CBC WITH DIFFERENTIAL (CANCER CENTER ONLY)
Abs Immature Granulocytes: 0.11 10*3/uL — ABNORMAL HIGH (ref 0.00–0.07)
Basophils Absolute: 0 10*3/uL (ref 0.0–0.1)
Basophils Relative: 0 %
Eosinophils Absolute: 0 10*3/uL (ref 0.0–0.5)
Eosinophils Relative: 0 %
HCT: 40.8 % (ref 39.0–52.0)
Hemoglobin: 14.1 g/dL (ref 13.0–17.0)
Immature Granulocytes: 1 %
Lymphocytes Relative: 3 %
Lymphs Abs: 0.5 10*3/uL — ABNORMAL LOW (ref 0.7–4.0)
MCH: 34.1 pg — ABNORMAL HIGH (ref 26.0–34.0)
MCHC: 34.6 g/dL (ref 30.0–36.0)
MCV: 98.6 fL (ref 80.0–100.0)
Monocytes Absolute: 0.4 10*3/uL (ref 0.1–1.0)
Monocytes Relative: 3 %
Neutro Abs: 12.4 10*3/uL — ABNORMAL HIGH (ref 1.7–7.7)
Neutrophils Relative %: 93 %
Platelet Count: 168 10*3/uL (ref 150–400)
RBC: 4.14 MIL/uL — ABNORMAL LOW (ref 4.22–5.81)
RDW: 13.8 % (ref 11.5–15.5)
WBC Count: 13.4 10*3/uL — ABNORMAL HIGH (ref 4.0–10.5)
nRBC: 0 % (ref 0.0–0.2)

## 2022-04-18 LAB — CMP (CANCER CENTER ONLY)
ALT: 9 U/L (ref 0–44)
AST: 8 U/L — ABNORMAL LOW (ref 15–41)
Albumin: 4.3 g/dL (ref 3.5–5.0)
Alkaline Phosphatase: 58 U/L (ref 38–126)
Anion gap: 11 (ref 5–15)
BUN: 17 mg/dL (ref 6–20)
CO2: 23 mmol/L (ref 22–32)
Calcium: 10 mg/dL (ref 8.9–10.3)
Chloride: 100 mmol/L (ref 98–111)
Creatinine: 1.12 mg/dL (ref 0.61–1.24)
GFR, Estimated: >60 mL/min (ref >60)
Glucose, Bld: 123 mg/dL — ABNORMAL HIGH (ref 70–99)
Potassium: 3.9 mmol/L (ref 3.5–5.1)
Sodium: 134 mmol/L — ABNORMAL LOW (ref 135–145)
Total Bilirubin: 0.5 mg/dL (ref 0.3–1.2)
Total Protein: 7.4 g/dL (ref 6.5–8.1)

## 2022-04-18 MED ORDER — SODIUM CHLORIDE 0.9 % IV SOLN: Freq: Once | INTRAVENOUS | Status: AC

## 2022-04-18 MED ORDER — DEXTROSE 5 % IV SOLN
56.0000 mg/m2 | Freq: Once | INTRAVENOUS | Status: AC
  Administered 2022-04-18: 110 mg via INTRAVENOUS
  Filled 2022-04-18: qty 15

## 2022-04-18 NOTE — Patient Instructions (Signed)
[facility]   Discharge Instructions: Thank you for choosing [facility] to provide your oncology and hematology care.   If you have a lab appointment with the Cancer Center, please go directly to the Cancer Center and check in at the registration area.   Wear comfortable clothing and clothing appropriate for easy access to any Portacath or PICC line.   We strive to give you quality time with your provider. You may need to reschedule your appointment if you arrive late (15 or more minutes).  Arriving late affects you and other patients whose appointments are after yours.  Also, if you miss three or more appointments without notifying the office, you may be dismissed from the clinic at the provider's discretion.      For prescription refill requests, have your pharmacy contact our office and allow 72 hours for refills to be completed.    Today you received the following chemotherapy and/or immunotherapy agents Carfilzomib (KYPROLIS).      To help prevent nausea and vomiting after your treatment, we encourage you to take your nausea medication as directed.  BELOW ARE SYMPTOMS THAT SHOULD BE REPORTED IMMEDIATELY: *FEVER GREATER THAN 100.4 F (38 C) OR HIGHER *CHILLS OR SWEATING *NAUSEA AND VOMITING THAT IS NOT CONTROLLED WITH YOUR NAUSEA MEDICATION *UNUSUAL SHORTNESS OF BREATH *UNUSUAL BRUISING OR BLEEDING *URINARY PROBLEMS (pain or burning when urinating, or frequent urination) *BOWEL PROBLEMS (unusual diarrhea, constipation, pain near the anus) TENDERNESS IN MOUTH AND THROAT WITH OR WITHOUT PRESENCE OF ULCERS (sore throat, sores in mouth, or a toothache) UNUSUAL RASH, SWELLING OR PAIN  UNUSUAL VAGINAL DISCHARGE OR ITCHING   Items with * indicate a potential emergency and should be followed up as soon as possible or go to the Emergency Department if any problems should occur.  Please show the CHEMOTHERAPY ALERT CARD or IMMUNOTHERAPY ALERT  CARD at check-in to the Emergency Department and triage nurse.  Should you have questions after your visit or need to cancel or reschedule your appointment, please contact [facility]  Dept: [phone]  and follow the prompts.  Office hours are 8:00 a.m. to 4:30 p.m. Monday - Friday. Please note that voicemails left after 4:00 p.m. may not be returned until the following business day.  We are closed weekends and major holidays. You have access to a nurse at all times for urgent questions. Please call the main number to the clinic Dept: [phone] and follow the prompts.   For any non-urgent questions, you may also contact your provider using MyChart. We now offer e-Visits for anyone 18 and older to request care online for non-urgent symptoms. For details visit [email or web address].   Also download the MyChart app! Go to the app store, search "MyChart", open the app, select , and log in with your MyChart username and password.  Carfilzomib Injection What is this medication? CARFILZOMIB (kar FILZ oh mib) treats multiple myeloma, a type of bone marrow cancer. It works by blocking a protein that causes cancer cells to grow and multiply. This helps to slow or stop the spread of cancer cells. This medicine may be used for other purposes; ask your health care provider or pharmacist if you have questions. COMMON BRAND NAME(S): KYPROLIS What should I tell my care team before I take this medication? They need to know if you have any of these conditions: Heart disease History of blood clots Irregular heartbeat Kidney disease Liver disease Lung or breathing   disease An unusual or allergic reaction to carfilzomib, or other medications, foods, dyes, or preservatives If you or your partner are pregnant or trying to get pregnant Breastfeeding How should I use this medication? This medication is injected into a vein. It is given by your care team in a  hospital or clinic setting. Talk to your care team about the use of this medication in children. Special care may be needed. Overdosage: If you think you have taken too much of this medicine contact a poison control center or emergency room at once. NOTE: This medicine is only for you. Do not share this medicine with others. What if I miss a dose? Keep appointments for follow-up doses. It is important not to miss your dose. Call your care team if you are unable to keep an appointment. What may interact with this medication? Interactions are not expected. This list may not describe all possible interactions. Give your health care provider a list of all the medicines, herbs, non-prescription drugs, or dietary supplements you use. Also tell them if you smoke, drink alcohol, or use illegal drugs. Some items may interact with your medicine. What should I watch for while using this medication? Your condition will be monitored carefully while you are receiving this medication. You may need blood work while taking this medication. Check with your care team if you have severe diarrhea, nausea, and vomiting, or if you sweat a lot. The loss of too much body fluid may make it dangerous for you to take this medication. This medication may affect your coordination, reaction time, or judgment. Do not drive or operate machinery until you know how this medication affects you. Sit up or stand slowly to reduce the risk of dizzy or fainting spells. Drinking alcohol with this medication can increase the risk of these side effects. Talk to your care team if you may be pregnant. Serious birth defects can occur if you take this medication during pregnancy and for 6 months after the last dose. You will need a negative pregnancy test before starting this medication. Contraception is recommended while taking this medication and for 6 months after the last dose. Your care team can help you find an option that works for you. If  your partner can get pregnant, use a condom during sex while taking this medication and for 3 months after the last dose. Do not breastfeed while taking this medication and for 2 weeks after the last dose. This medication may cause infertility. Talk to your care team if you are concerned about your fertility. What side effects may I notice from receiving this medication? Side effects that you should report to your care team as soon as possible: Allergic reactions--skin rash, itching, hives, swelling of the face, lips, tongue, or throat Bleeding--bloody or black, tar-like stools, vomiting blood or brown material that looks like coffee grounds, red or dark brown urine, small red or purple spots on skin, unusual bruising or bleeding Blood clot--pain, swelling, or warmth in the leg, shortness of breath, chest pain Dizziness, loss of balance or coordination, confusion or trouble speaking Heart attack--pain or tightness in the chest, shoulders, arms, or jaw, nausea, shortness of breath, cold or clammy skin, feeling faint or lightheaded Heart failure--shortness of breath, swelling of the ankles, feet, or hands, sudden weight gain, unusual weakness or fatigue Heart rhythm changes--fast or irregular heartbeat, dizziness, feeling faint or lightheaded, chest pain, trouble breathing Increase in blood pressure Infection--fever, chills, cough, sore throat, wounds that don't heal, pain or   trouble when passing urine, general feeling of discomfort or being unwell Infusion reactions--chest pain, shortness of breath or trouble breathing, feeling faint or lightheaded Kidney injury--decrease in the amount of urine, swelling of the ankles, hands, or feet Liver injury--right upper belly pain, loss of appetite, nausea, light-colored stool, dark yellow or brown urine, yellowing skin or eyes, unusual weakness or fatigue Lung injury--shortness of breath or trouble breathing, cough, spitting up blood, chest pain,  fever Pulmonary hypertension--shortness of breath, chest pain, fast or irregular heartbeat, feeling faint or lightheaded, fatigue, swelling of the ankles or feet Stomach pain, bloody diarrhea, pale skin, unusual weakness or fatigue, decrease in the amount of urine, which may be signs of hemolytic uremic syndrome Sudden and severe headache, confusion, change in vision, seizures, which may be signs of posterior reversible encephalopathy syndrome (PRES) TTP--purple spots on the skin or inside the mouth, pale skin, yellowing skin or eyes, unusual weakness or fatigue, fever, fast or irregular heartbeat, confusion, change in vision, trouble speaking, trouble walking Tumor lysis syndrome (TLS)--nausea, vomiting, diarrhea, decrease in the amount of urine, dark urine, unusual weakness or fatigue, confusion, muscle pain or cramps, fast or irregular heartbeat, joint pain Side effects that usually do not require medical attention (report to your care team if they continue or are bothersome): Diarrhea Fatigue Nausea Trouble sleeping This list may not describe all possible side effects. Call your doctor for medical advice about side effects. You may report side effects to FDA at 1-800-FDA-1088. Where should I keep my medication? This medication is given in a hospital or clinic. It will not be stored at home. NOTE: This sheet is a summary. It may not cover all possible information. If you have questions about this medicine, talk to your doctor, pharmacist, or health care provider.  2023 Elsevier/Gold Standard (2021-08-03 00:00:00)   

## 2022-04-19 ENCOUNTER — Other Ambulatory Visit: Payer: BLUE CROSS/BLUE SHIELD

## 2022-04-19 ENCOUNTER — Ambulatory Visit: Payer: BLUE CROSS/BLUE SHIELD

## 2022-04-26 ENCOUNTER — Ambulatory Visit: Payer: BLUE CROSS/BLUE SHIELD

## 2022-04-26 ENCOUNTER — Other Ambulatory Visit: Payer: BLUE CROSS/BLUE SHIELD

## 2022-04-28 ENCOUNTER — Telehealth: Payer: Self-pay | Admitting: *Deleted

## 2022-04-28 NOTE — Telephone Encounter (Signed)
Left VM with [employer] with update on current treatment regimen and that he will be seen at [facility] on 2/29 to discuss BMT.

## 2022-04-29 ENCOUNTER — Other Ambulatory Visit: Payer: Self-pay | Admitting: Oncology

## 2022-04-29 DIAGNOSIS — C9000 Multiple myeloma not having achieved remission: Secondary | ICD-10-CM

## 2022-05-02 ENCOUNTER — Inpatient Hospital Stay (HOSPITAL_BASED_OUTPATIENT_CLINIC_OR_DEPARTMENT_OTHER): Payer: BLUE CROSS/BLUE SHIELD | Admitting: Oncology

## 2022-05-02 ENCOUNTER — Inpatient Hospital Stay: Payer: BLUE CROSS/BLUE SHIELD

## 2022-05-02 ENCOUNTER — Inpatient Hospital Stay: Payer: BLUE CROSS/BLUE SHIELD | Attending: Oncology

## 2022-05-02 VITALS — BP 133/90 | HR 100 | Temp 98.2°F | Resp 18 | Ht 68.0 in | Wt 179.0 lb

## 2022-05-02 DIAGNOSIS — D649 Anemia, unspecified: Secondary | ICD-10-CM | POA: Insufficient documentation

## 2022-05-02 DIAGNOSIS — D696 Thrombocytopenia, unspecified: Secondary | ICD-10-CM | POA: Insufficient documentation

## 2022-05-02 DIAGNOSIS — Z7969 Long term (current) use of other immunomodulators and immunosuppressants: Secondary | ICD-10-CM | POA: Diagnosis not present

## 2022-05-02 DIAGNOSIS — Z5112 Encounter for antineoplastic immunotherapy: Secondary | ICD-10-CM | POA: Insufficient documentation

## 2022-05-02 DIAGNOSIS — C9000 Multiple myeloma not having achieved remission: Secondary | ICD-10-CM | POA: Diagnosis not present

## 2022-05-02 DIAGNOSIS — Z79899 Other long term (current) drug therapy: Secondary | ICD-10-CM | POA: Insufficient documentation

## 2022-05-02 LAB — CBC WITH DIFFERENTIAL (CANCER CENTER ONLY)
Abs Immature Granulocytes: 0.02 10*3/uL (ref 0.00–0.07)
Basophils Absolute: 0 10*3/uL (ref 0.0–0.1)
Basophils Relative: 0 %
Eosinophils Absolute: 0.1 10*3/uL (ref 0.0–0.5)
Eosinophils Relative: 1 %
HCT: 41.9 % (ref 39.0–52.0)
Hemoglobin: 14.5 g/dL (ref 13.0–17.0)
Immature Granulocytes: 0 %
Lymphocytes Relative: 6 %
Lymphs Abs: 0.7 10*3/uL (ref 0.7–4.0)
MCH: 34.4 pg — ABNORMAL HIGH (ref 26.0–34.0)
MCHC: 34.6 g/dL (ref 30.0–36.0)
MCV: 99.5 fL (ref 80.0–100.0)
Monocytes Absolute: 0.6 10*3/uL (ref 0.1–1.0)
Monocytes Relative: 5 %
Neutro Abs: 10.4 10*3/uL — ABNORMAL HIGH (ref 1.7–7.7)
Neutrophils Relative %: 88 %
Platelet Count: 227 10*3/uL (ref 150–400)
RBC: 4.21 MIL/uL — ABNORMAL LOW (ref 4.22–5.81)
RDW: 13.8 % (ref 11.5–15.5)
WBC Count: 11.8 10*3/uL — ABNORMAL HIGH (ref 4.0–10.5)
nRBC: 0 % (ref 0.0–0.2)

## 2022-05-02 LAB — CMP (CANCER CENTER ONLY)
ALT: 10 U/L (ref 0–44)
AST: 8 U/L — ABNORMAL LOW (ref 15–41)
Albumin: 4.3 g/dL (ref 3.5–5.0)
Alkaline Phosphatase: 64 U/L (ref 38–126)
Anion gap: 10 (ref 5–15)
BUN: 18 mg/dL (ref 6–20)
CO2: 21 mmol/L — ABNORMAL LOW (ref 22–32)
Calcium: 9.4 mg/dL (ref 8.9–10.3)
Chloride: 107 mmol/L (ref 98–111)
Creatinine: 1.18 mg/dL (ref 0.61–1.24)
GFR, Estimated: >60 mL/min (ref >60)
Glucose, Bld: 132 mg/dL — ABNORMAL HIGH (ref 70–99)
Potassium: 3.6 mmol/L (ref 3.5–5.1)
Sodium: 138 mmol/L (ref 135–145)
Total Bilirubin: 0.4 mg/dL (ref 0.3–1.2)
Total Protein: 6.8 g/dL (ref 6.5–8.1)

## 2022-05-02 LAB — PHOSPHORUS: Phosphorus: 2.3 mg/dL — ABNORMAL LOW (ref 2.5–4.6)

## 2022-05-02 NOTE — Progress Notes (Signed)
[facility] OFFICE PROGRESS NOTE   Diagnosis: Multiple myeloma  INTERVAL HISTORY:   Jordan King returns as scheduled.  He completed another 3 weeks of carfilzomib/Decadron beginning 04/04/2022.  He continues venetoclax.  He developed nausea and diarrhea beginning yesterday afternoon.  The diarrhea persisted during the night and this morning.  He had chills last night.  No fever.  He has increased low back pain.  No vomiting.  No dysuria.  No household contacts have been ill.  Objective:  Vital signs in last 24 hours:  Blood pressure (!) 133/90, pulse 100, temperature 98.2 F (36.8 C), temperature source Oral, resp. rate 18, height 5' 8"$  (1.727 m), weight 179 lb (81.2 kg), SpO2 98 %.    HEENT: No thrush or ulcers Resp: Lungs clear bilaterally Cardio: Regular rate and rhythm GI: Soft and nontender, no hepatosplenomegaly Vascular: No leg edema, slight decrease in skin turgor   Lab Results:  Lab Results  Component Value Date   WBC 11.8 (H) 05/02/2022   HGB 14.5 05/02/2022   HCT 41.9 05/02/2022   MCV 99.5 05/02/2022   PLT 227 05/02/2022   NEUTROABS 10.4 (H) 05/02/2022    CMP  Lab Results  Component Value Date   NA 134 (L) 04/18/2022   K 3.9 04/18/2022   CL 100 04/18/2022   CO2 23 04/18/2022   GLUCOSE 123 (H) 04/18/2022   BUN 17 04/18/2022   CREATININE 1.12 04/18/2022   CALCIUM 10.0 04/18/2022   PROT 7.4 04/18/2022   ALBUMIN 4.3 04/18/2022   AST 8 (L) 04/18/2022   ALT 9 04/18/2022   ALKPHOS 58 04/18/2022   BILITOT 0.5 04/18/2022   GFRNONAA >60 04/18/2022     Medications: I have reviewed the patient's current medications.   Assessment/Plan: Multiple myeloma -05/22/2021-kappa free light chain 11.3, lambda free light chain 17,028 -25 g proteinuria, 20 g lambda light chains -Serum immunofixation 05/22/2021-monoclonal lambda light chains -Bone marrow biopsy 05/23/2021-hypercellular marrow involved by plasma cell myeloma, 80-90% plasma cells on the bone  marrow biopsy, diminished iron stains -Cytogenetics-45 X, minus Y, t11:14 (11 q13) -Cycle 1 Velcade/Decadron 05/23/2021 (Decadron given 05/22/2021) -Cycle 1 DRVd 06/13/2021, treatment held 06/20/2021 due to a rash, Revlimid resumed 06/28/2021, daratumumab resumed 07/04/2021 Revlimid resumed 06/28/2021 Cycle 2 daratumumab/Revlimid/Decadron/Velcade 07/18/2021 (Revlimid started 07/19/2021, placed on hold 07/25/2021 secondary to a rash-last taken 07/24/2021) Treatment continued with weekly Velcade/Decadron and daratumumab Treatment changed to pomalidomide, daratumumab, Decadron 10/03/2021 Pomalidomide placed on hold 10/17/2021 due to a rash Pomalidomide resumed at a reduced dose of 2 mg daily for 21 days followed by 7-day break 10/31/2021 Pomalidomide 2 mg daily for 21 days beginning 11/28/2021 Lambda light chains increased 12/12/2021 Venetoclax/Carfilzomib/Decadron 12/26/2021, venetoclax started 01/05/2022 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 CARFILZOMIB/Decadron weekly x3 beginning 01/16/2022 Venetoclax resumed 01/19/2022 100 mg daily Carfilzomib/Decadron weekly x 3 beginning 02/13/2022 (day 15 not given) Carfilzomib /Decadron weekly x 3 beginning 03/06/2022 Carfilzomib/Decadron weekly x 3 beginning 04/04/2022   Renal failure secondary to #1-improved Hypercalcemia-status post treat with calcitonin, pamidronate 05/22/2021-resolved Lytic bone lesions Anemia Thrombocytopenia Hypocalcemia-IV calcium gluconate 05/30/2021, resolved Rash 06/20/2021,  consistent with a drug rash, recurrent rash 07/24/2021-Revlimid placed on hold; recurrent rash 10/17/2021 Admission 01/09/2022 with renal failure, elevated uric acid, rasburicase 01/09/2022 Wheezing/cough-likely secondary to COPD and asthma     Disposition: Jordan King has continued treatment with carfilzomib/Decadron/venetoclax for treatment of myeloma.  The lambda light chains remain low.  We will follow-up on the myeloma panel from today.  He  has  a diarrhea illness today.  He most likely has a viral gastroenteritis.  Treatment will be held today.  He will hold venetoclax until the diarrhea resolves.  He will call if the diarrhea does not resolve over the next few days.  Jordan King will return in 1 week that with the plan to resume carfilzomib/Decadron.  Jordan Coder, MD  05/02/2022  10:34 AM

## 2022-05-03 ENCOUNTER — Other Ambulatory Visit: Payer: Self-pay | Admitting: *Deleted

## 2022-05-03 ENCOUNTER — Other Ambulatory Visit: Payer: Self-pay

## 2022-05-03 DIAGNOSIS — C9000 Multiple myeloma not having achieved remission: Secondary | ICD-10-CM

## 2022-05-03 LAB — KAPPA/LAMBDA LIGHT CHAINS
Kappa free light chain: 4.8 mg/L (ref 3.3–19.4)
Kappa, lambda light chain ratio: 1.85 — ABNORMAL HIGH (ref 0.26–1.65)
Lambda free light chains: 2.6 mg/L — ABNORMAL LOW (ref 5.7–26.3)

## 2022-05-09 ENCOUNTER — Inpatient Hospital Stay: Payer: BLUE CROSS/BLUE SHIELD

## 2022-05-09 VITALS — BP 119/86 | HR 83 | Temp 98.2°F | Resp 18 | Ht 68.0 in | Wt 177.1 lb

## 2022-05-09 DIAGNOSIS — C9000 Multiple myeloma not having achieved remission: Secondary | ICD-10-CM

## 2022-05-09 LAB — CBC WITH DIFFERENTIAL (CANCER CENTER ONLY)
Abs Immature Granulocytes: 0.04 10*3/uL (ref 0.00–0.07)
Basophils Absolute: 0 10*3/uL (ref 0.0–0.1)
Basophils Relative: 0 %
Eosinophils Absolute: 0.1 10*3/uL (ref 0.0–0.5)
Eosinophils Relative: 1 %
HCT: 41.5 % (ref 39.0–52.0)
Hemoglobin: 14.5 g/dL (ref 13.0–17.0)
Immature Granulocytes: 0 %
Lymphocytes Relative: 7 %
Lymphs Abs: 0.8 10*3/uL (ref 0.7–4.0)
MCH: 34.4 pg — ABNORMAL HIGH (ref 26.0–34.0)
MCHC: 34.9 g/dL (ref 30.0–36.0)
MCV: 98.6 fL (ref 80.0–100.0)
Monocytes Absolute: 0.4 10*3/uL (ref 0.1–1.0)
Monocytes Relative: 4 %
Neutro Abs: 9.1 10*3/uL — ABNORMAL HIGH (ref 1.7–7.7)
Neutrophils Relative %: 88 %
Platelet Count: 229 10*3/uL (ref 150–400)
RBC: 4.21 MIL/uL — ABNORMAL LOW (ref 4.22–5.81)
RDW: 13.5 % (ref 11.5–15.5)
WBC Count: 10.3 10*3/uL (ref 4.0–10.5)
nRBC: 0 % (ref 0.0–0.2)

## 2022-05-09 LAB — CMP (CANCER CENTER ONLY)
ALT: 13 U/L (ref 0–44)
AST: 11 U/L — ABNORMAL LOW (ref 15–41)
Albumin: 4.5 g/dL (ref 3.5–5.0)
Alkaline Phosphatase: 64 U/L (ref 38–126)
Anion gap: 10 (ref 5–15)
BUN: 16 mg/dL (ref 6–20)
CO2: 23 mmol/L (ref 22–32)
Calcium: 9.4 mg/dL (ref 8.9–10.3)
Chloride: 104 mmol/L (ref 98–111)
Creatinine: 1.01 mg/dL (ref 0.61–1.24)
GFR, Estimated: >60 mL/min (ref >60)
Glucose, Bld: 112 mg/dL — ABNORMAL HIGH (ref 70–99)
Potassium: 3.8 mmol/L (ref 3.5–5.1)
Sodium: 137 mmol/L (ref 135–145)
Total Bilirubin: 0.3 mg/dL (ref 0.3–1.2)
Total Protein: 6.9 g/dL (ref 6.5–8.1)

## 2022-05-09 LAB — PHOSPHORUS: Phosphorus: 1.7 mg/dL — ABNORMAL LOW (ref 2.5–4.6)

## 2022-05-09 MED ORDER — SODIUM CHLORIDE 0.9 % IV SOLN: Freq: Once | INTRAVENOUS | Status: AC

## 2022-05-09 MED ORDER — DEXTROSE 5 % IV SOLN
56.0000 mg/m2 | Freq: Once | INTRAVENOUS | Status: AC
  Administered 2022-05-09: 110 mg via INTRAVENOUS
  Filled 2022-05-09: qty 30

## 2022-05-09 NOTE — Patient Instructions (Signed)
[facility]   Discharge Instructions: Thank you for choosing [facility] to provide your oncology and hematology care.   If you have a lab appointment with the [facility], please go directly to the [facility] and check in at the registration area.   Wear comfortable clothing and clothing appropriate for easy access to any Portacath or PICC line.   We strive to give you quality time with your provider. You may need to reschedule your appointment if you arrive late (15 or more minutes).  Arriving late affects you and other patients whose appointments are after yours.  Also, if you miss three or more appointments without notifying the office, you may be dismissed from the clinic at the provider's discretion.      For prescription refill requests, have your pharmacy contact our office and allow 72 hours for refills to be completed.    Today you received the following chemotherapy and/or immunotherapy agents Carfilzomib (KYPROLIS).      To help prevent nausea and vomiting after your treatment, we encourage you to take your nausea medication as directed.  BELOW ARE SYMPTOMS THAT SHOULD BE REPORTED IMMEDIATELY: *FEVER GREATER THAN 100.4 F (38 C) OR HIGHER *CHILLS OR SWEATING *NAUSEA AND VOMITING THAT IS NOT CONTROLLED WITH YOUR NAUSEA MEDICATION *UNUSUAL SHORTNESS OF BREATH *UNUSUAL BRUISING OR BLEEDING *URINARY PROBLEMS (pain or burning when urinating, or frequent urination) *BOWEL PROBLEMS (unusual diarrhea, constipation, pain near the anus) TENDERNESS IN MOUTH AND THROAT WITH OR WITHOUT PRESENCE OF ULCERS (sore throat, sores in mouth, or a toothache) UNUSUAL RASH, SWELLING OR PAIN  UNUSUAL VAGINAL DISCHARGE OR ITCHING   Items with * indicate a potential emergency and should be followed up as soon as possible or go to the Emergency Department if any problems should occur.  Please show the CHEMOTHERAPY ALERT CARD or IMMUNOTHERAPY ALERT  CARD at check-in to the Emergency Department and triage nurse.  Should you have questions after your visit or need to cancel or reschedule your appointment, please contact [facility]  Dept: [phone]  and follow the prompts.  Office hours are 8:00 a.m. to 4:30 p.m. Monday - Friday. Please note that voicemails left after 4:00 p.m. may not be returned until the following business day.  We are closed weekends and major holidays. You have access to a nurse at all times for urgent questions. Please call the main number to the clinic Dept: [phone] and follow the prompts.   For any non-urgent questions, you may also contact your provider using MyChart. We now offer e-Visits for anyone 37 and older to request care online for non-urgent symptoms. For details visit [email or web address].   Also download the MyChart app! Go to the app store, search "MyChart", open the app, select [facility], and log in with your MyChart username and password.  Carfilzomib Injection What is this medication? CARFILZOMIB (kar FILZ oh mib) treats multiple myeloma, a type of bone marrow cancer. It works by blocking a protein that causes cancer cells to grow and multiply. This helps to slow or stop the spread of cancer cells. This medicine may be used for other purposes; ask your health care provider or pharmacist if you have questions. COMMON BRAND NAME(S): KYPROLIS What should I tell my care team before I take this medication? They need to know if you have any of these conditions: Heart disease History of blood clots Irregular heartbeat Kidney disease Liver disease Lung or breathing  disease An unusual or allergic reaction to carfilzomib, or other medications, foods, dyes, or preservatives If you or your partner are pregnant or trying to get pregnant Breastfeeding How should I use this medication? This medication is injected into a vein. It is given by your care team in a  hospital or clinic setting. Talk to your care team about the use of this medication in children. Special care may be needed. Overdosage: If you think you have taken too much of this medicine contact a poison control center or emergency room at once. NOTE: This medicine is only for you. Do not share this medicine with others. What if I miss a dose? Keep appointments for follow-up doses. It is important not to miss your dose. Call your care team if you are unable to keep an appointment. What may interact with this medication? Interactions are not expected. This list may not describe all possible interactions. Give your health care provider a list of all the medicines, herbs, non-prescription drugs, or dietary supplements you use. Also tell them if you smoke, drink alcohol, or use illegal drugs. Some items may interact with your medicine. What should I watch for while using this medication? Your condition will be monitored carefully while you are receiving this medication. You may need blood work while taking this medication. Check with your care team if you have severe diarrhea, nausea, and vomiting, or if you sweat a lot. The loss of too much body fluid may make it dangerous for you to take this medication. This medication may affect your coordination, reaction time, or judgment. Do not drive or operate machinery until you know how this medication affects you. Sit up or stand slowly to reduce the risk of dizzy or fainting spells. Drinking alcohol with this medication can increase the risk of these side effects. Talk to your care team if you may be pregnant. Serious birth defects can occur if you take this medication during pregnancy and for 6 months after the last dose. You will need a negative pregnancy test before starting this medication. Contraception is recommended while taking this medication and for 6 months after the last dose. Your care team can help you find an option that works for you. If  your partner can get pregnant, use a condom during sex while taking this medication and for 3 months after the last dose. Do not breastfeed while taking this medication and for 2 weeks after the last dose. This medication may cause infertility. Talk to your care team if you are concerned about your fertility. What side effects may I notice from receiving this medication? Side effects that you should report to your care team as soon as possible: Allergic reactions--skin rash, itching, hives, swelling of the face, lips, tongue, or throat Bleeding--bloody or black, tar-like stools, vomiting blood or brown material that looks like coffee grounds, red or dark brown urine, small red or purple spots on skin, unusual bruising or bleeding Blood clot--pain, swelling, or warmth in the leg, shortness of breath, chest pain Dizziness, loss of balance or coordination, confusion or trouble speaking Heart attack--pain or tightness in the chest, shoulders, arms, or jaw, nausea, shortness of breath, cold or clammy skin, feeling faint or lightheaded Heart failure--shortness of breath, swelling of the ankles, feet, or hands, sudden weight gain, unusual weakness or fatigue Heart rhythm changes--fast or irregular heartbeat, dizziness, feeling faint or lightheaded, chest pain, trouble breathing Increase in blood pressure Infection--fever, chills, cough, sore throat, wounds that don't heal, pain or  trouble when passing urine, general feeling of discomfort or being unwell Infusion reactions--chest pain, shortness of breath or trouble breathing, feeling faint or lightheaded Kidney injury--decrease in the amount of urine, swelling of the ankles, hands, or feet Liver injury--right upper belly pain, loss of appetite, nausea, light-colored stool, dark yellow or brown urine, yellowing skin or eyes, unusual weakness or fatigue Lung injury--shortness of breath or trouble breathing, cough, spitting up blood, chest pain,  fever Pulmonary hypertension--shortness of breath, chest pain, fast or irregular heartbeat, feeling faint or lightheaded, fatigue, swelling of the ankles or feet Stomach pain, bloody diarrhea, pale skin, unusual weakness or fatigue, decrease in the amount of urine, which may be signs of hemolytic uremic syndrome Sudden and severe headache, confusion, change in vision, seizures, which may be signs of posterior reversible encephalopathy syndrome (PRES) TTP--purple spots on the skin or inside the mouth, pale skin, yellowing skin or eyes, unusual weakness or fatigue, fever, fast or irregular heartbeat, confusion, change in vision, trouble speaking, trouble walking Tumor lysis syndrome (TLS)--nausea, vomiting, diarrhea, decrease in the amount of urine, dark urine, unusual weakness or fatigue, confusion, muscle pain or cramps, fast or irregular heartbeat, joint pain Side effects that usually do not require medical attention (report to your care team if they continue or are bothersome): Diarrhea Fatigue Nausea Trouble sleeping This list may not describe all possible side effects. Call your doctor for medical advice about side effects. You may report side effects to FDA at 1-800-FDA-1088. Where should I keep my medication? This medication is given in a hospital or clinic. It will not be stored at home. NOTE: This sheet is a summary. It may not cover all possible information. If you have questions about this medicine, talk to your doctor, pharmacist, or health care provider.  2023 Elsevier/Gold Standard (2021-08-03 00:00:00)

## 2022-05-10 ENCOUNTER — Other Ambulatory Visit: Payer: Self-pay

## 2022-05-10 DIAGNOSIS — C9000 Multiple myeloma not having achieved remission: Secondary | ICD-10-CM

## 2022-05-11 ENCOUNTER — Other Ambulatory Visit: Payer: Self-pay

## 2022-05-11 ENCOUNTER — Telehealth: Payer: Self-pay

## 2022-05-11 DIAGNOSIS — C9000 Multiple myeloma not having achieved remission: Secondary | ICD-10-CM

## 2022-05-11 NOTE — Telephone Encounter (Signed)
Patient gave verbal understanding and had no further questions or concerns  

## 2022-05-11 NOTE — Telephone Encounter (Signed)
-----   Message from Jordan Pier, MD sent at 05/10/2022  8:10 AM EST ----- Please call patient, resume k-phos, repeat phosphorous next lab

## 2022-05-11 NOTE — Telephone Encounter (Signed)
-----   Message from [name], MD sent at 05/10/2022  8:10 AM EST ----- Please call patient, resume k-phos, repeat phosphorous next lab

## 2022-05-12 ENCOUNTER — Encounter: Payer: Self-pay | Admitting: Oncology

## 2022-05-12 ENCOUNTER — Other Ambulatory Visit: Payer: Self-pay | Admitting: *Deleted

## 2022-05-12 MED ORDER — VENETOCLAX 100 MG PO TABS: 100.0000 mg | ORAL_TABLET | Freq: Every day | ORAL | 1 refills | Status: DC

## 2022-05-14 ENCOUNTER — Other Ambulatory Visit: Payer: Self-pay | Admitting: Oncology

## 2022-05-14 DIAGNOSIS — C9000 Multiple myeloma not having achieved remission: Secondary | ICD-10-CM

## 2022-05-16 ENCOUNTER — Inpatient Hospital Stay: Payer: BLUE CROSS/BLUE SHIELD

## 2022-05-16 ENCOUNTER — Inpatient Hospital Stay: Payer: BLUE CROSS/BLUE SHIELD | Admitting: Licensed Clinical Social Worker

## 2022-05-16 VITALS — BP 113/79 | HR 78 | Temp 98.5°F | Resp 18 | Ht 68.0 in | Wt 180.2 lb

## 2022-05-16 DIAGNOSIS — C9000 Multiple myeloma not having achieved remission: Secondary | ICD-10-CM

## 2022-05-16 LAB — CBC WITH DIFFERENTIAL (CANCER CENTER ONLY)
Abs Immature Granulocytes: 0.05 10*3/uL (ref 0.00–0.07)
Basophils Absolute: 0 10*3/uL (ref 0.0–0.1)
Basophils Relative: 0 %
Eosinophils Absolute: 0.1 10*3/uL (ref 0.0–0.5)
Eosinophils Relative: 1 %
HCT: 41.6 % (ref 39.0–52.0)
Hemoglobin: 14.5 g/dL (ref 13.0–17.0)
Immature Granulocytes: 0 %
Lymphocytes Relative: 7 %
Lymphs Abs: 0.8 10*3/uL (ref 0.7–4.0)
MCH: 34.2 pg — ABNORMAL HIGH (ref 26.0–34.0)
MCHC: 34.9 g/dL (ref 30.0–36.0)
MCV: 98.1 fL (ref 80.0–100.0)
Monocytes Absolute: 0.5 10*3/uL (ref 0.1–1.0)
Monocytes Relative: 4 %
Neutro Abs: 9.8 10*3/uL — ABNORMAL HIGH (ref 1.7–7.7)
Neutrophils Relative %: 88 %
Platelet Count: 142 10*3/uL — ABNORMAL LOW (ref 150–400)
RBC: 4.24 MIL/uL (ref 4.22–5.81)
RDW: 13.6 % (ref 11.5–15.5)
WBC Count: 11.2 10*3/uL — ABNORMAL HIGH (ref 4.0–10.5)
nRBC: 0 % (ref 0.0–0.2)

## 2022-05-16 LAB — CMP (CANCER CENTER ONLY)
ALT: 10 U/L (ref 0–44)
AST: 10 U/L — ABNORMAL LOW (ref 15–41)
Albumin: 4.4 g/dL (ref 3.5–5.0)
Alkaline Phosphatase: 57 U/L (ref 38–126)
Anion gap: 8 (ref 5–15)
BUN: 19 mg/dL (ref 6–20)
CO2: 24 mmol/L (ref 22–32)
Calcium: 10 mg/dL (ref 8.9–10.3)
Chloride: 104 mmol/L (ref 98–111)
Creatinine: 1.05 mg/dL (ref 0.61–1.24)
GFR, Estimated: >60 mL/min (ref >60)
Glucose, Bld: 100 mg/dL — ABNORMAL HIGH (ref 70–99)
Potassium: 4.1 mmol/L (ref 3.5–5.1)
Sodium: 136 mmol/L (ref 135–145)
Total Bilirubin: 0.3 mg/dL (ref 0.3–1.2)
Total Protein: 6.8 g/dL (ref 6.5–8.1)

## 2022-05-16 LAB — PHOSPHORUS: Phosphorus: 2.4 mg/dL — ABNORMAL LOW (ref 2.5–4.6)

## 2022-05-16 MED ORDER — SODIUM CHLORIDE 0.9 % IV SOLN: Freq: Once | INTRAVENOUS | Status: AC

## 2022-05-16 MED ORDER — DEXTROSE 5 % IV SOLN
56.0000 mg/m2 | Freq: Once | INTRAVENOUS | Status: AC
  Administered 2022-05-16: 110 mg via INTRAVENOUS
  Filled 2022-05-16: qty 30

## 2022-05-16 NOTE — Patient Instructions (Signed)
[place]   Discharge Instructions: Thank you for choosing [facility] to provide your oncology and hematology care.   If you have a lab appointment with the [facility], please go directly to the [facility] and check in at the registration area.   Wear comfortable clothing and clothing appropriate for easy access to any Portacath or PICC line.   We strive to give you quality time with your provider. You may need to reschedule your appointment if you arrive late (15 or more minutes).  Arriving late affects you and other patients whose appointments are after yours.  Also, if you miss three or more appointments without notifying the office, you may be dismissed from the clinic at the provider's discretion.      For prescription refill requests, have your pharmacy contact our office and allow 72 hours for refills to be completed.    Today you received the following chemotherapy and/or immunotherapy agents Carfilzomib (KYPROLIS).      To help prevent nausea and vomiting after your treatment, we encourage you to take your nausea medication as directed.  BELOW ARE SYMPTOMS THAT SHOULD BE REPORTED IMMEDIATELY: *FEVER GREATER THAN 100.4 F (38 C) OR HIGHER *CHILLS OR SWEATING *NAUSEA AND VOMITING THAT IS NOT CONTROLLED WITH YOUR NAUSEA MEDICATION *UNUSUAL SHORTNESS OF BREATH *UNUSUAL BRUISING OR BLEEDING *URINARY PROBLEMS (pain or burning when urinating, or frequent urination) *BOWEL PROBLEMS (unusual diarrhea, constipation, pain near the anus) TENDERNESS IN MOUTH AND THROAT WITH OR WITHOUT PRESENCE OF ULCERS (sore throat, sores in mouth, or a toothache) UNUSUAL RASH, SWELLING OR PAIN  UNUSUAL VAGINAL DISCHARGE OR ITCHING   Items with * indicate a potential emergency and should be followed up as soon as possible or go to the Emergency Department if any problems should occur.  Please show the CHEMOTHERAPY ALERT CARD or IMMUNOTHERAPY ALERT  CARD at check-in to the Emergency Department and triage nurse.  Should you have questions after your visit or need to cancel or reschedule your appointment, please contact [facility]  Dept: [phone]  and follow the prompts.  Office hours are 8:00 a.m. to 4:30 p.m. Monday - Friday. Please note that voicemails left after 4:00 p.m. may not be returned until the following business day.  We are closed weekends and major holidays. You have access to a nurse at all times for urgent questions. Please call the main number to the clinic Dept: [phone] and follow the prompts.   For any non-urgent questions, you may also contact your provider using MyChart. We now offer e-Visits for anyone 59 and older to request care online for non-urgent symptoms. For details visit [email or web address].   Also download the MyChart app! Go to the app store, search "MyChart", open the app, select [facility], and log in with your MyChart username and password.  Carfilzomib Injection What is this medication? CARFILZOMIB (kar FILZ oh mib) treats multiple myeloma, a type of bone marrow cancer. It works by blocking a protein that causes cancer cells to grow and multiply. This helps to slow or stop the spread of cancer cells. This medicine may be used for other purposes; ask your health care provider or pharmacist if you have questions. COMMON BRAND NAME(S): KYPROLIS What should I tell my care team before I take this medication? They need to know if you have any of these conditions: Heart disease History of blood clots Irregular heartbeat Kidney disease Liver disease Lung or breathing  disease An unusual or allergic reaction to carfilzomib, or other medications, foods, dyes, or preservatives If you or your partner are pregnant or trying to get pregnant Breastfeeding How should I use this medication? This medication is injected into a vein. It is given by your care team in a  hospital or clinic setting. Talk to your care team about the use of this medication in children. Special care may be needed. Overdosage: If you think you have taken too much of this medicine contact a poison control center or emergency room at once. NOTE: This medicine is only for you. Do not share this medicine with others. What if I miss a dose? Keep appointments for follow-up doses. It is important not to miss your dose. Call your care team if you are unable to keep an appointment. What may interact with this medication? Interactions are not expected. This list may not describe all possible interactions. Give your health care provider a list of all the medicines, herbs, non-prescription drugs, or dietary supplements you use. Also tell them if you smoke, drink alcohol, or use illegal drugs. Some items may interact with your medicine. What should I watch for while using this medication? Your condition will be monitored carefully while you are receiving this medication. You may need blood work while taking this medication. Check with your care team if you have severe diarrhea, nausea, and vomiting, or if you sweat a lot. The loss of too much body fluid may make it dangerous for you to take this medication. This medication may affect your coordination, reaction time, or judgment. Do not drive or operate machinery until you know how this medication affects you. Sit up or stand slowly to reduce the risk of dizzy or fainting spells. Drinking alcohol with this medication can increase the risk of these side effects. Talk to your care team if you may be pregnant. Serious birth defects can occur if you take this medication during pregnancy and for 6 months after the last dose. You will need a negative pregnancy test before starting this medication. Contraception is recommended while taking this medication and for 6 months after the last dose. Your care team can help you find an option that works for you. If  your partner can get pregnant, use a condom during sex while taking this medication and for 3 months after the last dose. Do not breastfeed while taking this medication and for 2 weeks after the last dose. This medication may cause infertility. Talk to your care team if you are concerned about your fertility. What side effects may I notice from receiving this medication? Side effects that you should report to your care team as soon as possible: Allergic reactions--skin rash, itching, hives, swelling of the face, lips, tongue, or throat Bleeding--bloody or black, tar-like stools, vomiting blood or brown material that looks like coffee grounds, red or dark brown urine, small red or purple spots on skin, unusual bruising or bleeding Blood clot--pain, swelling, or warmth in the leg, shortness of breath, chest pain Dizziness, loss of balance or coordination, confusion or trouble speaking Heart attack--pain or tightness in the chest, shoulders, arms, or jaw, nausea, shortness of breath, cold or clammy skin, feeling faint or lightheaded Heart failure--shortness of breath, swelling of the ankles, feet, or hands, sudden weight gain, unusual weakness or fatigue Heart rhythm changes--fast or irregular heartbeat, dizziness, feeling faint or lightheaded, chest pain, trouble breathing Increase in blood pressure Infection--fever, chills, cough, sore throat, wounds that don't heal, pain or  trouble when passing urine, general feeling of discomfort or being unwell Infusion reactions--chest pain, shortness of breath or trouble breathing, feeling faint or lightheaded Kidney injury--decrease in the amount of urine, swelling of the ankles, hands, or feet Liver injury--right upper belly pain, loss of appetite, nausea, light-colored stool, dark yellow or brown urine, yellowing skin or eyes, unusual weakness or fatigue Lung injury--shortness of breath or trouble breathing, cough, spitting up blood, chest pain,  fever Pulmonary hypertension--shortness of breath, chest pain, fast or irregular heartbeat, feeling faint or lightheaded, fatigue, swelling of the ankles or feet Stomach pain, bloody diarrhea, pale skin, unusual weakness or fatigue, decrease in the amount of urine, which may be signs of hemolytic uremic syndrome Sudden and severe headache, confusion, change in vision, seizures, which may be signs of posterior reversible encephalopathy syndrome (PRES) TTP--purple spots on the skin or inside the mouth, pale skin, yellowing skin or eyes, unusual weakness or fatigue, fever, fast or irregular heartbeat, confusion, change in vision, trouble speaking, trouble walking Tumor lysis syndrome (TLS)--nausea, vomiting, diarrhea, decrease in the amount of urine, dark urine, unusual weakness or fatigue, confusion, muscle pain or cramps, fast or irregular heartbeat, joint pain Side effects that usually do not require medical attention (report to your care team if they continue or are bothersome): Diarrhea Fatigue Nausea Trouble sleeping This list may not describe all possible side effects. Call your doctor for medical advice about side effects. You may report side effects to FDA at 1-800-FDA-1088. Where should I keep my medication? This medication is given in a hospital or clinic. It will not be stored at home. NOTE: This sheet is a summary. It may not cover all possible information. If you have questions about this medicine, talk to your doctor, pharmacist, or health care provider.  2023 Elsevier/Gold Standard (2020-04-15 00:00:00)

## 2022-05-16 NOTE — Progress Notes (Signed)
[facility]  Initial Assessment   Bekim Venne. is a 52 y.o. year old male presenting alone. Clinical Social Work met with patient while he was in infusion to assess  psychosocial needs.   SDOH (Social Determinants of Health) assessments performed: Yes   SDOH Screenings   Food Insecurity: No Food Insecurity (01/10/2022)  Housing: Low Risk  (01/10/2022)  Transportation Needs: No Transportation Needs (01/10/2022)  Utilities: Not At Risk (01/10/2022)  Tobacco Use: High Risk (04/04/2022)     Distress Screen completed: No    05/31/2021   11:45 AM  ONCBCN DISTRESS SCREENING  Screening Type Initial Screening  Distress experienced in past week (1-10) 0  Spiritual/Religous concerns type Relating to God  Physical Problem type Pain;Getting around  Physician notified of physical symptoms No  Referral to clinical psychology No  Referral to clinical social work No  Referral to dietition No  Referral to financial advocate No  Referral to support programs No  Referral to palliative care No      Family/Social Information:  Housing Arrangement: patient lives with his girlfriend.  Family members/support persons in your life? Family and Friends.  Patient mother lives in [place].  She is caring for her mother-in-law. Transportation concerns: yes, due to the cost of gas.  Employment: Legally disabled.  Previously worked at [employer]. 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 Patient enjoys time with family/ friends Current coping  skills/ strengths: Active sense of humor , Capable of independent living , Communication skills , General fund of knowledge , and Supportive family/friends     SUMMARY: Current SDOH Barriers:  Fixed income.  Clinical Social Work Clinical Goal(s):  Explore community resource options for unmet needs related to:  Transportation  Interventions: Discussed common feeling and emotions when being diagnosed with cancer, and the importance of support during treatment Informed patient of the support team roles and support services at [facility] Provided [facility] contact information and encouraged patient to call with any questions or concerns Provided patient with information about the [employer] and [employer] and [employer].  Also gave patient two [employer] gift cards to help pay for gas.   Follow Up Plan: Patient will contact CSW with any support or resource needs Patient verbalizes understanding of plan: Yes    Rodman Pickle Katelin Kutsch, LCSW

## 2022-05-24 ENCOUNTER — Other Ambulatory Visit (HOSPITAL_BASED_OUTPATIENT_CLINIC_OR_DEPARTMENT_OTHER): Payer: Self-pay

## 2022-05-24 ENCOUNTER — Inpatient Hospital Stay: Payer: BLUE CROSS/BLUE SHIELD | Attending: Oncology

## 2022-05-24 ENCOUNTER — Inpatient Hospital Stay: Payer: BLUE CROSS/BLUE SHIELD

## 2022-05-24 ENCOUNTER — Encounter: Payer: Self-pay | Admitting: Nurse Practitioner

## 2022-05-24 ENCOUNTER — Inpatient Hospital Stay: Payer: BLUE CROSS/BLUE SHIELD | Admitting: Nurse Practitioner

## 2022-05-24 VITALS — BP 125/84 | HR 95 | Temp 98.1°F | Resp 18 | Ht 68.0 in | Wt 178.2 lb

## 2022-05-24 VITALS — BP 125/80 | HR 87 | Temp 98.0°F | Resp 18

## 2022-05-24 DIAGNOSIS — Z79899 Other long term (current) drug therapy: Secondary | ICD-10-CM | POA: Insufficient documentation

## 2022-05-24 DIAGNOSIS — C9000 Multiple myeloma not having achieved remission: Secondary | ICD-10-CM

## 2022-05-24 DIAGNOSIS — Z5112 Encounter for antineoplastic immunotherapy: Secondary | ICD-10-CM | POA: Diagnosis present

## 2022-05-24 DIAGNOSIS — D649 Anemia, unspecified: Secondary | ICD-10-CM | POA: Insufficient documentation

## 2022-05-24 LAB — CBC WITH DIFFERENTIAL (CANCER CENTER ONLY)
Abs Immature Granulocytes: 0.07 10*3/uL (ref 0.00–0.07)
Basophils Absolute: 0 10*3/uL (ref 0.0–0.1)
Basophils Relative: 0 %
Eosinophils Absolute: 0.1 10*3/uL (ref 0.0–0.5)
Eosinophils Relative: 1 %
HCT: 44.1 % (ref 39.0–52.0)
Hemoglobin: 15.4 g/dL (ref 13.0–17.0)
Immature Granulocytes: 1 %
Lymphocytes Relative: 6 %
Lymphs Abs: 0.8 10*3/uL (ref 0.7–4.0)
MCH: 34.1 pg — ABNORMAL HIGH (ref 26.0–34.0)
MCHC: 34.9 g/dL (ref 30.0–36.0)
MCV: 97.6 fL (ref 80.0–100.0)
Monocytes Absolute: 0.6 10*3/uL (ref 0.1–1.0)
Monocytes Relative: 4 %
Neutro Abs: 11.6 10*3/uL — ABNORMAL HIGH (ref 1.7–7.7)
Neutrophils Relative %: 88 %
Platelet Count: 220 10*3/uL (ref 150–400)
RBC: 4.52 MIL/uL (ref 4.22–5.81)
RDW: 13.6 % (ref 11.5–15.5)
WBC Count: 13.1 10*3/uL — ABNORMAL HIGH (ref 4.0–10.5)
nRBC: 0 % (ref 0.0–0.2)

## 2022-05-24 LAB — CMP (CANCER CENTER ONLY)
ALT: 10 U/L (ref 0–44)
AST: 10 U/L — ABNORMAL LOW (ref 15–41)
Albumin: 4.6 g/dL (ref 3.5–5.0)
Alkaline Phosphatase: 68 U/L (ref 38–126)
Anion gap: 11 (ref 5–15)
BUN: 18 mg/dL (ref 6–20)
CO2: 22 mmol/L (ref 22–32)
Calcium: 10.4 mg/dL — ABNORMAL HIGH (ref 8.9–10.3)
Chloride: 103 mmol/L (ref 98–111)
Creatinine: 1.09 mg/dL (ref 0.61–1.24)
GFR, Estimated: >60 mL/min (ref >60)
Glucose, Bld: 118 mg/dL — ABNORMAL HIGH (ref 70–99)
Potassium: 4.1 mmol/L (ref 3.5–5.1)
Sodium: 136 mmol/L (ref 135–145)
Total Bilirubin: 0.3 mg/dL (ref 0.3–1.2)
Total Protein: 7.4 g/dL (ref 6.5–8.1)

## 2022-05-24 LAB — PHOSPHORUS: Phosphorus: 2.4 mg/dL — ABNORMAL LOW (ref 2.5–4.6)

## 2022-05-24 MED ORDER — DEXTROSE 5 % IV SOLN
56.0000 mg/m2 | Freq: Once | INTRAVENOUS | Status: AC
  Administered 2022-05-24: 110 mg via INTRAVENOUS
  Filled 2022-05-24: qty 30

## 2022-05-24 MED ORDER — SODIUM CHLORIDE 0.9 % IV SOLN: Freq: Once | INTRAVENOUS | Status: AC

## 2022-05-24 NOTE — Patient Instructions (Signed)
[facility]   Discharge Instructions: Thank you for choosing [facility] to provide your oncology and hematology care.   If you have a lab appointment with the [facility], please go directly to the [facility] and check in at the registration area.   Wear comfortable clothing and clothing appropriate for easy access to any Portacath or PICC line.   We strive to give you quality time with your provider. You may need to reschedule your appointment if you arrive late (15 or more minutes).  Arriving late affects you and other patients whose appointments are after yours.  Also, if you miss three or more appointments without notifying the office, you may be dismissed from the clinic at the provider's discretion.      For prescription refill requests, have your pharmacy contact our office and allow 72 hours for refills to be completed.    Today you received the following chemotherapy and/or immunotherapy agents Kyprolis.      To help prevent nausea and vomiting after your treatment, we encourage you to take your nausea medication as directed.  BELOW ARE SYMPTOMS THAT SHOULD BE REPORTED IMMEDIATELY: *FEVER GREATER THAN 100.4 F (38 C) OR HIGHER *CHILLS OR SWEATING *NAUSEA AND VOMITING THAT IS NOT CONTROLLED WITH YOUR NAUSEA MEDICATION *UNUSUAL SHORTNESS OF BREATH *UNUSUAL BRUISING OR BLEEDING *URINARY PROBLEMS (pain or burning when urinating, or frequent urination) *BOWEL PROBLEMS (unusual diarrhea, constipation, pain near the anus) TENDERNESS IN MOUTH AND THROAT WITH OR WITHOUT PRESENCE OF ULCERS (sore throat, sores in mouth, or a toothache) UNUSUAL RASH, SWELLING OR PAIN  UNUSUAL VAGINAL DISCHARGE OR ITCHING   Items with * indicate a potential emergency and should be followed up as soon as possible or go to the Emergency Department if any problems should occur.  Please show the CHEMOTHERAPY ALERT CARD or IMMUNOTHERAPY ALERT CARD at  check-in to the Emergency Department and triage nurse.  Should you have questions after your visit or need to cancel or reschedule your appointment, please contact [facility]  Dept: [phone]  and follow the prompts.  Office hours are 8:00 a.m. to 4:30 p.m. Monday - Friday. Please note that voicemails left after 4:00 p.m. may not be returned until the following business day.  We are closed weekends and major holidays. You have access to a nurse at all times for urgent questions. Please call the main number to the clinic Dept: [phone] and follow the prompts.   For any non-urgent questions, you may also contact your provider using MyChart. We now offer e-Visits for anyone 52 and older to request care online for non-urgent symptoms. For details visit [email or web address].   Also download the MyChart app! Go to the app store, search "MyChart", open the app, select [facility], and log in with your MyChart username and password.  Carfilzomib Injection What is this medication? CARFILZOMIB (kar FILZ oh mib) treats multiple myeloma, a type of bone marrow cancer. It works by blocking a protein that causes cancer cells to grow and multiply. This helps to slow or stop the spread of cancer cells. This medicine may be used for other purposes; ask your health care provider or pharmacist if you have questions. COMMON BRAND NAME(S): KYPROLIS What should I tell my care team before I take this medication? They need to know if you have any of these conditions: Heart disease History of blood clots Irregular heartbeat Kidney disease Liver disease Lung or breathing disease  An unusual or allergic reaction to carfilzomib, or other medications, foods, dyes, or preservatives If you or your partner are pregnant or trying to get pregnant Breastfeeding How should I use this medication? This medication is injected into a vein. It is given by your care team in a hospital or  clinic setting. Talk to your care team about the use of this medication in children. Special care may be needed. Overdosage: If you think you have taken too much of this medicine contact a poison control center or emergency room at once. NOTE: This medicine is only for you. Do not share this medicine with others. What if I miss a dose? Keep appointments for follow-up doses. It is important not to miss your dose. Call your care team if you are unable to keep an appointment. What may interact with this medication? Interactions are not expected. This list may not describe all possible interactions. Give your health care provider a list of all the medicines, herbs, non-prescription drugs, or dietary supplements you use. Also tell them if you smoke, drink alcohol, or use illegal drugs. Some items may interact with your medicine. What should I watch for while using this medication? Your condition will be monitored carefully while you are receiving this medication. You may need blood work while taking this medication. Check with your care team if you have severe diarrhea, nausea, and vomiting, or if you sweat a lot. The loss of too much body fluid may make it dangerous for you to take this medication. This medication may affect your coordination, reaction time, or judgment. Do not drive or operate machinery until you know how this medication affects you. Sit up or stand slowly to reduce the risk of dizzy or fainting spells. Drinking alcohol with this medication can increase the risk of these side effects. Talk to your care team if you may be pregnant. Serious birth defects can occur if you take this medication during pregnancy and for 6 months after the last dose. You will need a negative pregnancy test before starting this medication. Contraception is recommended while taking this medication and for 6 months after the last dose. Your care team can help you find an option that works for you. If your partner  can get pregnant, use a condom during sex while taking this medication and for 3 months after the last dose. Do not breastfeed while taking this medication and for 2 weeks after the last dose. This medication may cause infertility. Talk to your care team if you are concerned about your fertility. What side effects may I notice from receiving this medication? Side effects that you should report to your care team as soon as possible: Allergic reactions--skin rash, itching, hives, swelling of the face, lips, tongue, or throat Bleeding--bloody or black, tar-like stools, vomiting blood or brown material that looks like coffee grounds, red or dark brown urine, small red or purple spots on skin, unusual bruising or bleeding Blood clot--pain, swelling, or warmth in the leg, shortness of breath, chest pain Dizziness, loss of balance or coordination, confusion or trouble speaking Heart attack--pain or tightness in the chest, shoulders, arms, or jaw, nausea, shortness of breath, cold or clammy skin, feeling faint or lightheaded Heart failure--shortness of breath, swelling of the ankles, feet, or hands, sudden weight gain, unusual weakness or fatigue Heart rhythm changes--fast or irregular heartbeat, dizziness, feeling faint or lightheaded, chest pain, trouble breathing Increase in blood pressure Infection--fever, chills, cough, sore throat, wounds that don't heal, pain or trouble  when passing urine, general feeling of discomfort or being unwell Infusion reactions--chest pain, shortness of breath or trouble breathing, feeling faint or lightheaded Kidney injury--decrease in the amount of urine, swelling of the ankles, hands, or feet Liver injury--right upper belly pain, loss of appetite, nausea, light-colored stool, dark yellow or brown urine, yellowing skin or eyes, unusual weakness or fatigue Lung injury--shortness of breath or trouble breathing, cough, spitting up blood, chest pain, fever Pulmonary  hypertension--shortness of breath, chest pain, fast or irregular heartbeat, feeling faint or lightheaded, fatigue, swelling of the ankles or feet Stomach pain, bloody diarrhea, pale skin, unusual weakness or fatigue, decrease in the amount of urine, which may be signs of hemolytic uremic syndrome Sudden and severe headache, confusion, change in vision, seizures, which may be signs of posterior reversible encephalopathy syndrome (PRES) TTP--purple spots on the skin or inside the mouth, pale skin, yellowing skin or eyes, unusual weakness or fatigue, fever, fast or irregular heartbeat, confusion, change in vision, trouble speaking, trouble walking Tumor lysis syndrome (TLS)--nausea, vomiting, diarrhea, decrease in the amount of urine, dark urine, unusual weakness or fatigue, confusion, muscle pain or cramps, fast or irregular heartbeat, joint pain Side effects that usually do not require medical attention (report to your care team if they continue or are bothersome): Diarrhea Fatigue Nausea Trouble sleeping This list may not describe all possible side effects. Call your doctor for medical advice about side effects. You may report side effects to FDA at 1-800-FDA-1088. Where should I keep my medication? This medication is given in a hospital or clinic. It will not be stored at home. NOTE: This sheet is a summary. It may not cover all possible information. If you have questions about this medicine, talk to your doctor, pharmacist, or health care provider.  2023 Elsevier/Gold Standard (2020-04-15 00:00:00)

## 2022-05-24 NOTE — Progress Notes (Signed)
[facility] OFFICE PROGRESS NOTE   Diagnosis: Multiple myeloma  INTERVAL HISTORY:   Jordan King returns as scheduled.  He continues venetoclax.  He began another cycle of Carfilzomib/Decadron 05/09/2022.  He saw Dr. Feliciana Rossetti at [facility] 05/18/2022.  Felt to be responding well to treatment with VGPR/unconfirmed CR.  They discussed moving forward with transplant.  He has periodic loose stools.  He wonders if this could be diet related.  He feels a little nauseated this morning.  He has a headache.  He thinks the headache may be due to driving in bad weather.  No numbness or tingling in the hands or feet.  He is concerned about the vaccines he would need to receive following transplant.  Objective:  Vital signs in last 24 hours:  Blood pressure 125/84, pulse 95, temperature 98.1 F (36.7 C), temperature source Oral, resp. rate 18, height '5\' 8"'$  (1.727 m), weight 178 lb 3.2 oz (80.8 kg), SpO2 98 %.    HEENT: No thrush or ulcers. Resp: Lungs clear bilaterally. Cardio: Regular rate and rhythm. GI: Abdomen soft and nontender.  No hepatosplenomegaly. Vascular: No leg edema. Skin: No rash.   Lab Results:  Lab Results  Component Value Date   WBC 11.2 (H) 05/16/2022   HGB 14.5 05/16/2022   HCT 41.6 05/16/2022   MCV 98.1 05/16/2022   PLT 142 (L) 05/16/2022   NEUTROABS 9.8 (H) 05/16/2022    Imaging:  No results found.  Medications: I have reviewed the patient's current medications.  Assessment/Plan: Multiple myeloma -05/22/2021-kappa free light chain 11.3, lambda free light chain 17,028 -25 g proteinuria, 20 g lambda light chains -Serum immunofixation 05/22/2021-monoclonal lambda light chains -Bone marrow biopsy 05/23/2021-hypercellular marrow involved by plasma cell myeloma, 80-90% plasma cells on the bone marrow biopsy, diminished iron stains -Cytogenetics-45 X, minus Y, t11:14 (11 q13) -Cycle 1 Velcade/Decadron 05/23/2021 (Decadron given 05/22/2021) -Cycle 1 DRVd 06/13/2021,  treatment held 06/20/2021 due to a rash, Revlimid resumed 06/28/2021, daratumumab resumed 07/04/2021 Revlimid resumed 06/28/2021 Cycle 2 daratumumab/Revlimid/Decadron/Velcade 07/18/2021 (Revlimid started 07/19/2021, placed on hold 07/25/2021 secondary to a rash-last taken 07/24/2021) Treatment continued with weekly Velcade/Decadron and daratumumab Treatment changed to pomalidomide, daratumumab, Decadron 10/03/2021 Pomalidomide placed on hold 10/17/2021 due to a rash Pomalidomide resumed at a reduced dose of 2 mg daily for 21 days followed by 7-day break 10/31/2021 Pomalidomide 2 mg daily for 21 days beginning 11/28/2021 Lambda light chains increased 12/12/2021 Venetoclax/Carfilzomib/Decadron 12/26/2021, venetoclax started 01/05/2022 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 CARFILZOMIB/Decadron weekly x3 beginning 01/16/2022 Venetoclax resumed 01/19/2022 100 mg daily Carfilzomib/Decadron weekly x 3 beginning 02/13/2022 (day 15 not given) Carfilzomib /Decadron weekly x 3 beginning 03/06/2022 Carfilzomib/Decadron weekly x 3 beginning 04/04/2022 Carfilzomib/Decadron weekly x 3 beginning 05/09/2022   Renal failure secondary to #1-improved Hypercalcemia-status post treat with calcitonin, pamidronate 05/22/2021-resolved Lytic bone lesions Anemia Thrombocytopenia Hypocalcemia-IV calcium gluconate 05/30/2021, resolved Rash 06/20/2021,  consistent with a drug rash, recurrent rash 07/24/2021-Revlimid placed on hold; recurrent rash 10/17/2021 Admission 01/09/2022 with renal failure, elevated uric acid, rasburicase 01/09/2022 Wheezing/cough-likely secondary to COPD and asthma  Disposition: Jordan King appears stable.  He continues Carfilzomib/Decadron weekly x 3.  He will complete the current cycle today.  He is off next week.  He will return to begin another cycle 06/07/2022.  He is discussing transplant with Dr. Feliciana Rossetti.  He is expecting a call from the transplant coordinator in the near future.  Plan to  keep the current treatment going for now.  CBC reviewed.  Counts adequate to proceed with treatment today.  We will see him in follow-up in 4 weeks.  He will contact the office in the interim with any problems.      Ned Card ANP/GNP-BC   05/24/2022  10:12 AM

## 2022-05-24 NOTE — Progress Notes (Signed)
Per patient report, premedication Decadron taken at home this morning at 8:00 am

## 2022-05-24 NOTE — Progress Notes (Signed)
Patient seen by Lisa Thomas NP today  Vitals are within treatment parameters.  Labs reviewed by Lisa Thomas NP and are within treatment parameters.  Per physician team, patient is ready for treatment and there are NO modifications to the treatment plan.  

## 2022-06-04 ENCOUNTER — Other Ambulatory Visit: Payer: Self-pay | Admitting: Oncology

## 2022-06-07 ENCOUNTER — Other Ambulatory Visit: Payer: Self-pay | Admitting: Oncology

## 2022-06-07 ENCOUNTER — Other Ambulatory Visit: Payer: Self-pay | Admitting: Nurse Practitioner

## 2022-06-07 ENCOUNTER — Inpatient Hospital Stay: Payer: BLUE CROSS/BLUE SHIELD

## 2022-06-07 ENCOUNTER — Encounter: Payer: Self-pay | Admitting: Oncology

## 2022-06-07 ENCOUNTER — Ambulatory Visit: Payer: BLUE CROSS/BLUE SHIELD

## 2022-06-07 ENCOUNTER — Other Ambulatory Visit: Payer: BLUE CROSS/BLUE SHIELD

## 2022-06-07 VITALS — BP 113/84 | HR 81 | Temp 98.5°F | Resp 20 | Ht 68.0 in | Wt 180.0 lb

## 2022-06-07 DIAGNOSIS — C9000 Multiple myeloma not having achieved remission: Secondary | ICD-10-CM

## 2022-06-07 LAB — CMP (CANCER CENTER ONLY)
ALT: 12 U/L (ref 0–44)
AST: 10 U/L — ABNORMAL LOW (ref 15–41)
Albumin: 3.9 g/dL (ref 3.5–5.0)
Alkaline Phosphatase: 58 U/L (ref 38–126)
Anion gap: 11 (ref 5–15)
BUN: 21 mg/dL — ABNORMAL HIGH (ref 6–20)
CO2: 21 mmol/L — ABNORMAL LOW (ref 22–32)
Calcium: 9.7 mg/dL (ref 8.9–10.3)
Chloride: 105 mmol/L (ref 98–111)
Creatinine: 1.15 mg/dL (ref 0.61–1.24)
GFR, Estimated: >60 mL/min (ref >60)
Glucose, Bld: 137 mg/dL — ABNORMAL HIGH (ref 70–99)
Potassium: 3.6 mmol/L (ref 3.5–5.1)
Sodium: 137 mmol/L (ref 135–145)
Total Bilirubin: 0.3 mg/dL (ref 0.3–1.2)
Total Protein: 6.5 g/dL (ref 6.5–8.1)

## 2022-06-07 LAB — CBC WITH DIFFERENTIAL (CANCER CENTER ONLY)
Abs Immature Granulocytes: 0.06 10*3/uL (ref 0.00–0.07)
Basophils Absolute: 0 10*3/uL (ref 0.0–0.1)
Basophils Relative: 0 %
Eosinophils Absolute: 0.1 10*3/uL (ref 0.0–0.5)
Eosinophils Relative: 1 %
HCT: 42.3 % (ref 39.0–52.0)
Hemoglobin: 14.7 g/dL (ref 13.0–17.0)
Immature Granulocytes: 1 %
Lymphocytes Relative: 5 %
Lymphs Abs: 0.7 10*3/uL (ref 0.7–4.0)
MCH: 34.3 pg — ABNORMAL HIGH (ref 26.0–34.0)
MCHC: 34.8 g/dL (ref 30.0–36.0)
MCV: 98.8 fL (ref 80.0–100.0)
Monocytes Absolute: 0.3 10*3/uL (ref 0.1–1.0)
Monocytes Relative: 2 %
Neutro Abs: 12.1 10*3/uL — ABNORMAL HIGH (ref 1.7–7.7)
Neutrophils Relative %: 91 %
Platelet Count: 226 10*3/uL (ref 150–400)
RBC: 4.28 MIL/uL (ref 4.22–5.81)
RDW: 14.1 % (ref 11.5–15.5)
WBC Count: 13.3 10*3/uL — ABNORMAL HIGH (ref 4.0–10.5)
nRBC: 0 % (ref 0.0–0.2)

## 2022-06-07 MED ORDER — SODIUM CHLORIDE 0.9 % IV SOLN: Freq: Once | INTRAVENOUS | Status: AC

## 2022-06-07 MED ORDER — DEXTROSE 5 % IV SOLN
56.0000 mg/m2 | Freq: Once | INTRAVENOUS | Status: AC
  Administered 2022-06-07: 110 mg via INTRAVENOUS
  Filled 2022-06-07: qty 30

## 2022-06-07 NOTE — Patient Instructions (Signed)
[facility]   Discharge Instructions: Thank you for choosing [facility] to provide your oncology and hematology care.   If you have a lab appointment with the Cancer Center, please go directly to the Cancer Center and check in at the registration area.   Wear comfortable clothing and clothing appropriate for easy access to any Portacath or PICC line.   We strive to give you quality time with your provider. You may need to reschedule your appointment if you arrive late (15 or more minutes).  Arriving late affects you and other patients whose appointments are after yours.  Also, if you miss three or more appointments without notifying the office, you may be dismissed from the clinic at the provider's discretion.      For prescription refill requests, have your pharmacy contact our office and allow 72 hours for refills to be completed.    Today you received the following chemotherapy and/or immunotherapy agents Carfilzomib (KYPROLIS).      To help prevent nausea and vomiting after your treatment, we encourage you to take your nausea medication as directed.  BELOW ARE SYMPTOMS THAT SHOULD BE REPORTED IMMEDIATELY: *FEVER GREATER THAN 100.4 F (38 C) OR HIGHER *CHILLS OR SWEATING *NAUSEA AND VOMITING THAT IS NOT CONTROLLED WITH YOUR NAUSEA MEDICATION *UNUSUAL SHORTNESS OF BREATH *UNUSUAL BRUISING OR BLEEDING *URINARY PROBLEMS (pain or burning when urinating, or frequent urination) *BOWEL PROBLEMS (unusual diarrhea, constipation, pain near the anus) TENDERNESS IN MOUTH AND THROAT WITH OR WITHOUT PRESENCE OF ULCERS (sore throat, sores in mouth, or a toothache) UNUSUAL RASH, SWELLING OR PAIN  UNUSUAL VAGINAL DISCHARGE OR ITCHING   Items with * indicate a potential emergency and should be followed up as soon as possible or go to the Emergency Department if any problems should occur.  Please show the CHEMOTHERAPY ALERT CARD or IMMUNOTHERAPY ALERT  CARD at check-in to the Emergency Department and triage nurse.  Should you have questions after your visit or need to cancel or reschedule your appointment, please contact [facility]  Dept: [phone]  and follow the prompts.  Office hours are 8:00 a.m. to 4:30 p.m. Monday - Friday. Please note that voicemails left after 4:00 p.m. may not be returned until the following business day.  We are closed weekends and major holidays. You have access to a nurse at all times for urgent questions. Please call the main number to the clinic Dept: [phone] and follow the prompts.   For any non-urgent questions, you may also contact your provider using MyChart. We now offer e-Visits for anyone 18 and older to request care online for non-urgent symptoms. For details visit [email or web address].   Also download the MyChart app! Go to the app store, search "MyChart", open the app, select [facility], and log in with your MyChart username and password.  Carfilzomib Injection What is this medication? CARFILZOMIB (kar FILZ oh mib) treats multiple myeloma, a type of bone marrow cancer. It works by blocking a protein that causes cancer cells to grow and multiply. This helps to slow or stop the spread of cancer cells. This medicine may be used for other purposes; ask your health care provider or pharmacist if you have questions. COMMON BRAND NAME(S): KYPROLIS What should I tell my care team before I take this medication? They need to know if you have any of these conditions: Heart disease History of blood clots Irregular heartbeat Kidney disease Liver disease Lung or breathing   disease An unusual or allergic reaction to carfilzomib, or other medications, foods, dyes, or preservatives If you or your partner are pregnant or trying to get pregnant Breastfeeding How should I use this medication? This medication is injected into a vein. It is given by your care team in a  hospital or clinic setting. Talk to your care team about the use of this medication in children. Special care may be needed. Overdosage: If you think you have taken too much of this medicine contact a poison control center or emergency room at once. NOTE: This medicine is only for you. Do not share this medicine with others. What if I miss a dose? Keep appointments for follow-up doses. It is important not to miss your dose. Call your care team if you are unable to keep an appointment. What may interact with this medication? Interactions are not expected. This list may not describe all possible interactions. Give your health care provider a list of all the medicines, herbs, non-prescription drugs, or dietary supplements you use. Also tell them if you smoke, drink alcohol, or use illegal drugs. Some items may interact with your medicine. What should I watch for while using this medication? Your condition will be monitored carefully while you are receiving this medication. You may need blood work while taking this medication. Check with your care team if you have severe diarrhea, nausea, and vomiting, or if you sweat a lot. The loss of too much body fluid may make it dangerous for you to take this medication. This medication may affect your coordination, reaction time, or judgment. Do not drive or operate machinery until you know how this medication affects you. Sit up or stand slowly to reduce the risk of dizzy or fainting spells. Drinking alcohol with this medication can increase the risk of these side effects. Talk to your care team if you may be pregnant. Serious birth defects can occur if you take this medication during pregnancy and for 6 months after the last dose. You will need a negative pregnancy test before starting this medication. Contraception is recommended while taking this medication and for 6 months after the last dose. Your care team can help you find an option that works for you. If  your partner can get pregnant, use a condom during sex while taking this medication and for 3 months after the last dose. Do not breastfeed while taking this medication and for 2 weeks after the last dose. This medication may cause infertility. Talk to your care team if you are concerned about your fertility. What side effects may I notice from receiving this medication? Side effects that you should report to your care team as soon as possible: Allergic reactions--skin rash, itching, hives, swelling of the face, lips, tongue, or throat Bleeding--bloody or black, tar-like stools, vomiting blood or brown material that looks like coffee grounds, red or dark brown urine, small red or purple spots on skin, unusual bruising or bleeding Blood clot--pain, swelling, or warmth in the leg, shortness of breath, chest pain Dizziness, loss of balance or coordination, confusion or trouble speaking Heart attack--pain or tightness in the chest, shoulders, arms, or jaw, nausea, shortness of breath, cold or clammy skin, feeling faint or lightheaded Heart failure--shortness of breath, swelling of the ankles, feet, or hands, sudden weight gain, unusual weakness or fatigue Heart rhythm changes--fast or irregular heartbeat, dizziness, feeling faint or lightheaded, chest pain, trouble breathing Increase in blood pressure Infection--fever, chills, cough, sore throat, wounds that don't heal, pain or   trouble when passing urine, general feeling of discomfort or being unwell Infusion reactions--chest pain, shortness of breath or trouble breathing, feeling faint or lightheaded Kidney injury--decrease in the amount of urine, swelling of the ankles, hands, or feet Liver injury--right upper belly pain, loss of appetite, nausea, light-colored stool, dark yellow or brown urine, yellowing skin or eyes, unusual weakness or fatigue Lung injury--shortness of breath or trouble breathing, cough, spitting up blood, chest pain,  fever Pulmonary hypertension--shortness of breath, chest pain, fast or irregular heartbeat, feeling faint or lightheaded, fatigue, swelling of the ankles or feet Stomach pain, bloody diarrhea, pale skin, unusual weakness or fatigue, decrease in the amount of urine, which may be signs of hemolytic uremic syndrome Sudden and severe headache, confusion, change in vision, seizures, which may be signs of posterior reversible encephalopathy syndrome (PRES) TTP--purple spots on the skin or inside the mouth, pale skin, yellowing skin or eyes, unusual weakness or fatigue, fever, fast or irregular heartbeat, confusion, change in vision, trouble speaking, trouble walking Tumor lysis syndrome (TLS)--nausea, vomiting, diarrhea, decrease in the amount of urine, dark urine, unusual weakness or fatigue, confusion, muscle pain or cramps, fast or irregular heartbeat, joint pain Side effects that usually do not require medical attention (report to your care team if they continue or are bothersome): Diarrhea Fatigue Nausea Trouble sleeping This list may not describe all possible side effects. Call your doctor for medical advice about side effects. You may report side effects to FDA at 1-800-FDA-1088. Where should I keep my medication? This medication is given in a hospital or clinic. It will not be stored at home. NOTE: This sheet is a summary. It may not cover all possible information. If you have questions about this medicine, talk to your doctor, pharmacist, or health care provider.  2023 Elsevier/Gold Standard (2020-04-15 00:00:00)   

## 2022-06-08 ENCOUNTER — Encounter: Payer: Self-pay | Admitting: Oncology

## 2022-06-08 ENCOUNTER — Other Ambulatory Visit: Payer: Self-pay | Admitting: Nurse Practitioner

## 2022-06-08 DIAGNOSIS — C9000 Multiple myeloma not having achieved remission: Secondary | ICD-10-CM

## 2022-06-08 MED ORDER — DEXAMETHASONE 4 MG PO TABS: 20.0000 mg | ORAL_TABLET | ORAL | 0 refills | Status: DC

## 2022-06-08 MED ORDER — OXYCODONE-ACETAMINOPHEN 5-325 MG PO TABS: 1.0000 | ORAL_TABLET | Freq: Four times a day (QID) | ORAL | 0 refills | Status: DC | PRN

## 2022-06-08 MED ORDER — LORAZEPAM 0.5 MG PO TABS: ORAL_TABLET | ORAL | 0 refills | Status: DC

## 2022-06-09 ENCOUNTER — Encounter: Payer: Self-pay | Admitting: *Deleted

## 2022-06-09 NOTE — Progress Notes (Signed)
Received request from [facility] for all records from January 2024 to present. This was emailed to HIM.

## 2022-06-14 ENCOUNTER — Ambulatory Visit: Payer: BLUE CROSS/BLUE SHIELD

## 2022-06-14 ENCOUNTER — Other Ambulatory Visit: Payer: BLUE CROSS/BLUE SHIELD

## 2022-06-14 ENCOUNTER — Inpatient Hospital Stay: Payer: BLUE CROSS/BLUE SHIELD

## 2022-06-14 ENCOUNTER — Encounter: Payer: Self-pay | Admitting: Oncology

## 2022-06-14 VITALS — BP 123/81 | HR 81 | Temp 98.6°F | Resp 20 | Ht 68.0 in | Wt 180.1 lb

## 2022-06-14 DIAGNOSIS — C9000 Multiple myeloma not having achieved remission: Secondary | ICD-10-CM

## 2022-06-14 LAB — CMP (CANCER CENTER ONLY)
ALT: 10 U/L (ref 0–44)
AST: 10 U/L — ABNORMAL LOW (ref 15–41)
Albumin: 4.5 g/dL (ref 3.5–5.0)
Alkaline Phosphatase: 58 U/L (ref 38–126)
Anion gap: 9 (ref 5–15)
BUN: 20 mg/dL (ref 6–20)
CO2: 23 mmol/L (ref 22–32)
Calcium: 10.2 mg/dL (ref 8.9–10.3)
Chloride: 104 mmol/L (ref 98–111)
Creatinine: 1.04 mg/dL (ref 0.61–1.24)
GFR, Estimated: >60 mL/min (ref >60)
Glucose, Bld: 109 mg/dL — ABNORMAL HIGH (ref 70–99)
Potassium: 3.7 mmol/L (ref 3.5–5.1)
Sodium: 136 mmol/L (ref 135–145)
Total Bilirubin: 0.3 mg/dL (ref 0.3–1.2)
Total Protein: 7.1 g/dL (ref 6.5–8.1)

## 2022-06-14 LAB — CBC WITH DIFFERENTIAL (CANCER CENTER ONLY)
Abs Immature Granulocytes: 0.05 10*3/uL (ref 0.00–0.07)
Basophils Absolute: 0 10*3/uL (ref 0.0–0.1)
Basophils Relative: 0 %
Eosinophils Absolute: 0.1 10*3/uL (ref 0.0–0.5)
Eosinophils Relative: 1 %
HCT: 44.1 % (ref 39.0–52.0)
Hemoglobin: 15.4 g/dL (ref 13.0–17.0)
Immature Granulocytes: 0 %
Lymphocytes Relative: 6 %
Lymphs Abs: 0.8 10*3/uL (ref 0.7–4.0)
MCH: 34 pg (ref 26.0–34.0)
MCHC: 34.9 g/dL (ref 30.0–36.0)
MCV: 97.4 fL (ref 80.0–100.0)
Monocytes Absolute: 0.6 10*3/uL (ref 0.1–1.0)
Monocytes Relative: 5 %
Neutro Abs: 10.9 10*3/uL — ABNORMAL HIGH (ref 1.7–7.7)
Neutrophils Relative %: 88 %
Platelet Count: 147 10*3/uL — ABNORMAL LOW (ref 150–400)
RBC: 4.53 MIL/uL (ref 4.22–5.81)
RDW: 13.9 % (ref 11.5–15.5)
WBC Count: 12.4 10*3/uL — ABNORMAL HIGH (ref 4.0–10.5)
nRBC: 0 % (ref 0.0–0.2)

## 2022-06-14 MED ORDER — DEXTROSE 5 % IV SOLN
56.0000 mg/m2 | Freq: Once | INTRAVENOUS | Status: AC
  Administered 2022-06-14: 110 mg via INTRAVENOUS
  Filled 2022-06-14: qty 30

## 2022-06-14 MED ORDER — SODIUM CHLORIDE 0.9 % IV SOLN: Freq: Once | INTRAVENOUS | Status: AC

## 2022-06-14 NOTE — Patient Instructions (Signed)
[facility]   Discharge Instructions: Thank you for choosing [facility] to provide your oncology and hematology care.   If you have a lab appointment with the Cancer Center, please go directly to the Cancer Center and check in at the registration area.   Wear comfortable clothing and clothing appropriate for easy access to any Portacath or PICC line.   We strive to give you quality time with your provider. You may need to reschedule your appointment if you arrive late (15 or more minutes).  Arriving late affects you and other patients whose appointments are after yours.  Also, if you miss three or more appointments without notifying the office, you may be dismissed from the clinic at the provider's discretion.      For prescription refill requests, have your pharmacy contact our office and allow 72 hours for refills to be completed.    Today you received the following chemotherapy and/or immunotherapy agents Carfilzomib (KYPROLIS).      To help prevent nausea and vomiting after your treatment, we encourage you to take your nausea medication as directed.  BELOW ARE SYMPTOMS THAT SHOULD BE REPORTED IMMEDIATELY: *FEVER GREATER THAN 100.4 F (38 C) OR HIGHER *CHILLS OR SWEATING *NAUSEA AND VOMITING THAT IS NOT CONTROLLED WITH YOUR NAUSEA MEDICATION *UNUSUAL SHORTNESS OF BREATH *UNUSUAL BRUISING OR BLEEDING *URINARY PROBLEMS (pain or burning when urinating, or frequent urination) *BOWEL PROBLEMS (unusual diarrhea, constipation, pain near the anus) TENDERNESS IN MOUTH AND THROAT WITH OR WITHOUT PRESENCE OF ULCERS (sore throat, sores in mouth, or a toothache) UNUSUAL RASH, SWELLING OR PAIN  UNUSUAL VAGINAL DISCHARGE OR ITCHING   Items with * indicate a potential emergency and should be followed up as soon as possible or go to the Emergency Department if any problems should occur.  Please show the CHEMOTHERAPY ALERT CARD or IMMUNOTHERAPY ALERT  CARD at check-in to the Emergency Department and triage nurse.  Should you have questions after your visit or need to cancel or reschedule your appointment, please contact [facility]  Dept: [phone]  and follow the prompts.  Office hours are 8:00 a.m. to 4:30 p.m. Monday - Friday. Please note that voicemails left after 4:00 p.m. may not be returned until the following business day.  We are closed weekends and major holidays. You have access to a nurse at all times for urgent questions. Please call the main number to the clinic Dept: [phone] and follow the prompts.   For any non-urgent questions, you may also contact your provider using MyChart. We now offer e-Visits for anyone 18 and older to request care online for non-urgent symptoms. For details visit [email or web address].   Also download the MyChart app! Go to the app store, search "MyChart", open the app, select [facility], and log in with your MyChart username and password.  Carfilzomib Injection What is this medication? CARFILZOMIB (kar FILZ oh mib) treats multiple myeloma, a type of bone marrow cancer. It works by blocking a protein that causes cancer cells to grow and multiply. This helps to slow or stop the spread of cancer cells. This medicine may be used for other purposes; ask your health care provider or pharmacist if you have questions. COMMON BRAND NAME(S): KYPROLIS What should I tell my care team before I take this medication? They need to know if you have any of these conditions: Heart disease History of blood clots Irregular heartbeat Kidney disease Liver disease Lung or breathing   disease An unusual or allergic reaction to carfilzomib, or other medications, foods, dyes, or preservatives If you or your partner are pregnant or trying to get pregnant Breastfeeding How should I use this medication? This medication is injected into a vein. It is given by your care team in a  hospital or clinic setting. Talk to your care team about the use of this medication in children. Special care may be needed. Overdosage: If you think you have taken too much of this medicine contact a poison control center or emergency room at once. NOTE: This medicine is only for you. Do not share this medicine with others. What if I miss a dose? Keep appointments for follow-up doses. It is important not to miss your dose. Call your care team if you are unable to keep an appointment. What may interact with this medication? Interactions are not expected. This list may not describe all possible interactions. Give your health care provider a list of all the medicines, herbs, non-prescription drugs, or dietary supplements you use. Also tell them if you smoke, drink alcohol, or use illegal drugs. Some items may interact with your medicine. What should I watch for while using this medication? Your condition will be monitored carefully while you are receiving this medication. You may need blood work while taking this medication. Check with your care team if you have severe diarrhea, nausea, and vomiting, or if you sweat a lot. The loss of too much body fluid may make it dangerous for you to take this medication. This medication may affect your coordination, reaction time, or judgment. Do not drive or operate machinery until you know how this medication affects you. Sit up or stand slowly to reduce the risk of dizzy or fainting spells. Drinking alcohol with this medication can increase the risk of these side effects. Talk to your care team if you may be pregnant. Serious birth defects can occur if you take this medication during pregnancy and for 6 months after the last dose. You will need a negative pregnancy test before starting this medication. Contraception is recommended while taking this medication and for 6 months after the last dose. Your care team can help you find an option that works for you. If  your partner can get pregnant, use a condom during sex while taking this medication and for 3 months after the last dose. Do not breastfeed while taking this medication and for 2 weeks after the last dose. This medication may cause infertility. Talk to your care team if you are concerned about your fertility. What side effects may I notice from receiving this medication? Side effects that you should report to your care team as soon as possible: Allergic reactions--skin rash, itching, hives, swelling of the face, lips, tongue, or throat Bleeding--bloody or black, tar-like stools, vomiting blood or brown material that looks like coffee grounds, red or dark brown urine, small red or purple spots on skin, unusual bruising or bleeding Blood clot--pain, swelling, or warmth in the leg, shortness of breath, chest pain Dizziness, loss of balance or coordination, confusion or trouble speaking Heart attack--pain or tightness in the chest, shoulders, arms, or jaw, nausea, shortness of breath, cold or clammy skin, feeling faint or lightheaded Heart failure--shortness of breath, swelling of the ankles, feet, or hands, sudden weight gain, unusual weakness or fatigue Heart rhythm changes--fast or irregular heartbeat, dizziness, feeling faint or lightheaded, chest pain, trouble breathing Increase in blood pressure Infection--fever, chills, cough, sore throat, wounds that don't heal, pain or   trouble when passing urine, general feeling of discomfort or being unwell Infusion reactions--chest pain, shortness of breath or trouble breathing, feeling faint or lightheaded Kidney injury--decrease in the amount of urine, swelling of the ankles, hands, or feet Liver injury--right upper belly pain, loss of appetite, nausea, light-colored stool, dark yellow or brown urine, yellowing skin or eyes, unusual weakness or fatigue Lung injury--shortness of breath or trouble breathing, cough, spitting up blood, chest pain,  fever Pulmonary hypertension--shortness of breath, chest pain, fast or irregular heartbeat, feeling faint or lightheaded, fatigue, swelling of the ankles or feet Stomach pain, bloody diarrhea, pale skin, unusual weakness or fatigue, decrease in the amount of urine, which may be signs of hemolytic uremic syndrome Sudden and severe headache, confusion, change in vision, seizures, which may be signs of posterior reversible encephalopathy syndrome (PRES) TTP--purple spots on the skin or inside the mouth, pale skin, yellowing skin or eyes, unusual weakness or fatigue, fever, fast or irregular heartbeat, confusion, change in vision, trouble speaking, trouble walking Tumor lysis syndrome (TLS)--nausea, vomiting, diarrhea, decrease in the amount of urine, dark urine, unusual weakness or fatigue, confusion, muscle pain or cramps, fast or irregular heartbeat, joint pain Side effects that usually do not require medical attention (report to your care team if they continue or are bothersome): Diarrhea Fatigue Nausea Trouble sleeping This list may not describe all possible side effects. Call your doctor for medical advice about side effects. You may report side effects to FDA at 1-800-FDA-1088. Where should I keep my medication? This medication is given in a hospital or clinic. It will not be stored at home. NOTE: This sheet is a summary. It may not cover all possible information. If you have questions about this medicine, talk to your doctor, pharmacist, or health care provider.  2023 Elsevier/Gold Standard (2020-04-15 00:00:00)   

## 2022-06-16 ENCOUNTER — Other Ambulatory Visit: Payer: Self-pay | Admitting: *Deleted

## 2022-06-16 MED ORDER — VENETOCLAX 100 MG PO TABS: 100.0000 mg | ORAL_TABLET | Freq: Every day | ORAL | 1 refills | Status: DC

## 2022-06-16 NOTE — Telephone Encounter (Signed)
Oral oncology team reports [facility] pharmacy needs refill on his venetoclax scrip.

## 2022-06-20 ENCOUNTER — Other Ambulatory Visit: Payer: Self-pay

## 2022-06-20 ENCOUNTER — Inpatient Hospital Stay: Payer: BLUE CROSS/BLUE SHIELD

## 2022-06-20 ENCOUNTER — Inpatient Hospital Stay (HOSPITAL_BASED_OUTPATIENT_CLINIC_OR_DEPARTMENT_OTHER): Payer: BLUE CROSS/BLUE SHIELD | Admitting: Oncology

## 2022-06-20 ENCOUNTER — Inpatient Hospital Stay: Payer: BLUE CROSS/BLUE SHIELD | Attending: Oncology | Admitting: *Deleted

## 2022-06-20 ENCOUNTER — Encounter: Payer: Self-pay | Admitting: *Deleted

## 2022-06-20 VITALS — BP 126/95 | HR 71

## 2022-06-20 DIAGNOSIS — D649 Anemia, unspecified: Secondary | ICD-10-CM | POA: Diagnosis not present

## 2022-06-20 DIAGNOSIS — Z5112 Encounter for antineoplastic immunotherapy: Secondary | ICD-10-CM | POA: Diagnosis present

## 2022-06-20 DIAGNOSIS — C9000 Multiple myeloma not having achieved remission: Secondary | ICD-10-CM

## 2022-06-20 DIAGNOSIS — Z79899 Other long term (current) drug therapy: Secondary | ICD-10-CM | POA: Diagnosis not present

## 2022-06-20 DIAGNOSIS — D696 Thrombocytopenia, unspecified: Secondary | ICD-10-CM | POA: Insufficient documentation

## 2022-06-20 LAB — CBC WITH DIFFERENTIAL (CANCER CENTER ONLY)
Abs Immature Granulocytes: 0.06 10*3/uL (ref 0.00–0.07)
Basophils Absolute: 0 10*3/uL (ref 0.0–0.1)
Basophils Relative: 0 %
Eosinophils Absolute: 0 10*3/uL (ref 0.0–0.5)
Eosinophils Relative: 0 %
HCT: 43.6 % (ref 39.0–52.0)
Hemoglobin: 15.1 g/dL (ref 13.0–17.0)
Immature Granulocytes: 0 %
Lymphocytes Relative: 4 %
Lymphs Abs: 0.5 10*3/uL — ABNORMAL LOW (ref 0.7–4.0)
MCH: 33.9 pg (ref 26.0–34.0)
MCHC: 34.6 g/dL (ref 30.0–36.0)
MCV: 97.8 fL (ref 80.0–100.0)
Monocytes Absolute: 0.2 10*3/uL (ref 0.1–1.0)
Monocytes Relative: 2 %
Neutro Abs: 13.5 10*3/uL — ABNORMAL HIGH (ref 1.7–7.7)
Neutrophils Relative %: 94 %
Platelet Count: 152 10*3/uL (ref 150–400)
RBC: 4.46 MIL/uL (ref 4.22–5.81)
RDW: 14 % (ref 11.5–15.5)
WBC Count: 14.3 10*3/uL — ABNORMAL HIGH (ref 4.0–10.5)
nRBC: 0 % (ref 0.0–0.2)

## 2022-06-20 LAB — CMP (CANCER CENTER ONLY)
ALT: 10 U/L (ref 0–44)
AST: 10 U/L — ABNORMAL LOW (ref 15–41)
Albumin: 4.4 g/dL (ref 3.5–5.0)
Alkaline Phosphatase: 58 U/L (ref 38–126)
Anion gap: 12 (ref 5–15)
BUN: 22 mg/dL — ABNORMAL HIGH (ref 6–20)
CO2: 19 mmol/L — ABNORMAL LOW (ref 22–32)
Calcium: 10.1 mg/dL (ref 8.9–10.3)
Chloride: 105 mmol/L (ref 98–111)
Creatinine: 1.02 mg/dL (ref 0.61–1.24)
GFR, Estimated: >60 mL/min (ref >60)
Glucose, Bld: 157 mg/dL — ABNORMAL HIGH (ref 70–99)
Potassium: 4 mmol/L (ref 3.5–5.1)
Sodium: 136 mmol/L (ref 135–145)
Total Bilirubin: 0.3 mg/dL (ref 0.3–1.2)
Total Protein: 7.1 g/dL (ref 6.5–8.1)

## 2022-06-20 MED ORDER — ZOLEDRONIC ACID 4 MG/100ML IV SOLN
4.0000 mg | Freq: Once | INTRAVENOUS | Status: AC
  Administered 2022-06-20: 4 mg via INTRAVENOUS
  Filled 2022-06-20: qty 100

## 2022-06-20 MED ORDER — SODIUM CHLORIDE 0.9 % IV SOLN: Freq: Once | INTRAVENOUS | Status: AC

## 2022-06-20 MED ORDER — DEXTROSE 5 % IV SOLN
56.0000 mg/m2 | Freq: Once | INTRAVENOUS | Status: AC
  Administered 2022-06-20: 110 mg via INTRAVENOUS
  Filled 2022-06-20: qty 30

## 2022-06-20 NOTE — Progress Notes (Signed)
Left VM for Dr. Sylvan Cheese triage nurse, Kalman Shan reporting last planned dose of carfilzomib was given today and patient is still on venetoclax 100 mg daily. What are plans on proceeding with transplant? Phone [phone]

## 2022-06-20 NOTE — Progress Notes (Signed)
[facility] OFFICE PROGRESS NOTE   Diagnosis: Multiple myeloma  INTERVAL HISTORY:   Mr Caraker  begin another cycle of carfilzomib/Decadron on 06/07/2022.  No neuropathy symptoms.  He continues venetoclax.  He saw Dr. Feliciana Rossetti 05/18/2022 and is undergoing evaluation to proceed with stem cell therapy.  Objective:  Vital signs in last 24 hours:  Blood pressure 131/81, pulse 90, temperature 98.1 F (36.7 C), temperature source Oral, resp. rate 18, height 5\' 8"  (1.727 m), weight 180 lb 12.8 oz (82 kg), SpO2 99 %.    HEENT: No thrush Resp: Lungs clear bilaterally Cardio: Regular rate and rhythm GI: No hepatosplenomegaly Vascular: No leg edema   Lab Results:  Lab Results  Component Value Date   WBC 14.3 (H) 06/20/2022   HGB 15.1 06/20/2022   HCT 43.6 06/20/2022   MCV 97.8 06/20/2022   PLT 152 06/20/2022   NEUTROABS 13.5 (H) 06/20/2022    CMP  Lab Results  Component Value Date   NA 136 06/20/2022   K 4.0 06/20/2022   CL 105 06/20/2022   CO2 19 (L) 06/20/2022   GLUCOSE 157 (H) 06/20/2022   BUN 22 (H) 06/20/2022   CREATININE 1.02 06/20/2022   CALCIUM 10.1 06/20/2022   PROT 7.1 06/20/2022   ALBUMIN 4.4 06/20/2022   AST 10 (L) 06/20/2022   ALT 10 06/20/2022   ALKPHOS 58 06/20/2022   BILITOT 0.3 06/20/2022   GFRNONAA >60 06/20/2022    No results found for: "CEA1", "CEA", "CAN199", "CA125"  Lab Results  Component Value Date   INR 1.1 05/22/2021   LABPROT 14.4 05/22/2021    Imaging:  No results found.  Medications: I have reviewed the patient's current medications.   Assessment/Plan: Multiple myeloma -05/22/2021-kappa free light chain 11.3, lambda free light chain 17,028 -25 g proteinuria, 20 g lambda light chains -Serum immunofixation 05/22/2021-monoclonal lambda light chains -Bone marrow biopsy 05/23/2021-hypercellular marrow involved by plasma cell myeloma, 80-90% plasma cells on the bone marrow biopsy, diminished iron stains -Cytogenetics-45 X,  minus Y, t11:14 (11 q13) -Cycle 1 Velcade/Decadron 05/23/2021 (Decadron given 05/22/2021) -Cycle 1 DRVd 06/13/2021, treatment held 06/20/2021 due to a rash, Revlimid resumed 06/28/2021, daratumumab resumed 07/04/2021 Revlimid resumed 06/28/2021 Cycle 2 daratumumab/Revlimid/Decadron/Velcade 07/18/2021 (Revlimid started 07/19/2021, placed on hold 07/25/2021 secondary to a rash-last taken 07/24/2021) Treatment continued with weekly Velcade/Decadron and daratumumab Treatment changed to pomalidomide, daratumumab, Decadron 10/03/2021 Pomalidomide placed on hold 10/17/2021 due to a rash Pomalidomide resumed at a reduced dose of 2 mg daily for 21 days followed by 7-day break 10/31/2021 Pomalidomide 2 mg daily for 21 days beginning 11/28/2021 Lambda light chains increased 12/12/2021 Venetoclax/Carfilzomib/Decadron 12/26/2021, venetoclax started 01/05/2022 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 CARFILZOMIB/Decadron weekly x3 beginning 01/16/2022 Venetoclax resumed 01/19/2022 100 mg daily Carfilzomib/Decadron weekly x 3 beginning 02/13/2022 (day 15 not given) Carfilzomib /Decadron weekly x 3 beginning 03/06/2022 Carfilzomib/Decadron weekly x 3 beginning 04/04/2022 Carfilzomib/Decadron weekly x 3 beginning 05/09/2022 Carfilzomib/Decadron weekly x 3 beginning 06/07/2022   Renal failure secondary to #1-improved Hypercalcemia-status post treat with calcitonin, pamidronate 05/22/2021-resolved Lytic bone lesions Anemia Thrombocytopenia Hypocalcemia-IV calcium gluconate 05/30/2021, resolved Rash 06/20/2021,  consistent with a drug rash, recurrent rash 07/24/2021-Revlimid placed on hold; recurrent rash 10/17/2021 Admission 01/09/2022 with renal failure, elevated uric acid, rasburicase 01/09/2022 Wheezing/cough-likely secondary to COPD and asthma    Disposition: Mr Bixler is an clinical admission for multiple myeloma.  He will complete another cycle of carfilzomib/Decadron today.  He continues daily  venetoclax.  He is undergoing prestem  cell evaluation at [facility].  We will contact the [facility] team to discuss stopping venetoclax and carfilzomib.  Mr. Bonomo will receive Zometa today. He will be scheduled for an office visit in approximately 6-7 weeks.  We will see him sooner as recommended by the [facility] transplant team.  Betsy Coder, MD  06/20/2022  11:40 AM

## 2022-06-20 NOTE — Progress Notes (Signed)
Patient seen by Dr. Benay Spice today  Vitals are within treatment parameters.No intervention needed for BP 131/81  Labs reviewed by Dr. Benay Spice and are within treatment parameters.  Per physician team, patient is ready for treatment. Please note that modifications are being made to the treatment plan including Adding Zometa today

## 2022-06-20 NOTE — Patient Instructions (Signed)
[facility]   Discharge Instructions: Thank you for choosing [facility] to provide your oncology and hematology care.   If you have a lab appointment with the [facility], please go directly to the [facility] and check in at the registration area.   Wear comfortable clothing and clothing appropriate for easy access to any Portacath or PICC line.   We strive to give you quality time with your provider. You may need to reschedule your appointment if you arrive late (15 or more minutes).  Arriving late affects you and other patients whose appointments are after yours.  Also, if you miss three or more appointments without notifying the office, you may be dismissed from the clinic at the provider's discretion.      For prescription refill requests, have your pharmacy contact our office and allow 72 hours for refills to be completed.    Today you received the following chemotherapy and/or immunotherapy agents Kyprolis.      To help prevent nausea and vomiting after your treatment, we encourage you to take your nausea medication as directed.  BELOW ARE SYMPTOMS THAT SHOULD BE REPORTED IMMEDIATELY: *FEVER GREATER THAN 100.4 F (38 C) OR HIGHER *CHILLS OR SWEATING *NAUSEA AND VOMITING THAT IS NOT CONTROLLED WITH YOUR NAUSEA MEDICATION *UNUSUAL SHORTNESS OF BREATH *UNUSUAL BRUISING OR BLEEDING *URINARY PROBLEMS (pain or burning when urinating, or frequent urination) *BOWEL PROBLEMS (unusual diarrhea, constipation, pain near the anus) TENDERNESS IN MOUTH AND THROAT WITH OR WITHOUT PRESENCE OF ULCERS (sore throat, sores in mouth, or a toothache) UNUSUAL RASH, SWELLING OR PAIN  UNUSUAL VAGINAL DISCHARGE OR ITCHING   Items with * indicate a potential emergency and should be followed up as soon as possible or go to the Emergency Department if any problems should occur.  Please show the CHEMOTHERAPY ALERT CARD or IMMUNOTHERAPY ALERT CARD at  check-in to the Emergency Department and triage nurse.  Should you have questions after your visit or need to cancel or reschedule your appointment, please contact [facility]  Dept: [phone]  and follow the prompts.  Office hours are 8:00 a.m. to 4:30 p.m. Monday - Friday. Please note that voicemails left after 4:00 p.m. may not be returned until the following business day.  We are closed weekends and major holidays. You have access to a nurse at all times for urgent questions. Please call the main number to the clinic Dept: [phone] and follow the prompts.   For any non-urgent questions, you may also contact your provider using MyChart. We now offer e-Visits for anyone 17 and older to request care online for non-urgent symptoms. For details visit [email or web address].   Also download the MyChart app! Go to the app store, search "MyChart", open the app, select [facility], and log in with your MyChart username and password.  Carfilzomib Injection What is this medication? CARFILZOMIB (kar FILZ oh mib) treats multiple myeloma, a type of bone marrow cancer. It works by blocking a protein that causes cancer cells to grow and multiply. This helps to slow or stop the spread of cancer cells. This medicine may be used for other purposes; ask your health care provider or pharmacist if you have questions. COMMON BRAND NAME(S): KYPROLIS What should I tell my care team before I take this medication? They need to know if you have any of these conditions: Heart disease History of blood clots Irregular heartbeat Kidney disease Liver disease Lung or breathing disease  An unusual or allergic reaction to carfilzomib, or other medications, foods, dyes, or preservatives If you or your partner are pregnant or trying to get pregnant Breastfeeding How should I use this medication? This medication is injected into a vein. It is given by your care team in a hospital or  clinic setting. Talk to your care team about the use of this medication in children. Special care may be needed. Overdosage: If you think you have taken too much of this medicine contact a poison control center or emergency room at once. NOTE: This medicine is only for you. Do not share this medicine with others. What if I miss a dose? Keep appointments for follow-up doses. It is important not to miss your dose. Call your care team if you are unable to keep an appointment. What may interact with this medication? Interactions are not expected. This list may not describe all possible interactions. Give your health care provider a list of all the medicines, herbs, non-prescription drugs, or dietary supplements you use. Also tell them if you smoke, drink alcohol, or use illegal drugs. Some items may interact with your medicine. What should I watch for while using this medication? Your condition will be monitored carefully while you are receiving this medication. You may need blood work while taking this medication. Check with your care team if you have severe diarrhea, nausea, and vomiting, or if you sweat a lot. The loss of too much body fluid may make it dangerous for you to take this medication. This medication may affect your coordination, reaction time, or judgment. Do not drive or operate machinery until you know how this medication affects you. Sit up or stand slowly to reduce the risk of dizzy or fainting spells. Drinking alcohol with this medication can increase the risk of these side effects. Talk to your care team if you may be pregnant. Serious birth defects can occur if you take this medication during pregnancy and for 6 months after the last dose. You will need a negative pregnancy test before starting this medication. Contraception is recommended while taking this medication and for 6 months after the last dose. Your care team can help you find an option that works for you. If your partner  can get pregnant, use a condom during sex while taking this medication and for 3 months after the last dose. Do not breastfeed while taking this medication and for 2 weeks after the last dose. This medication may cause infertility. Talk to your care team if you are concerned about your fertility. What side effects may I notice from receiving this medication? Side effects that you should report to your care team as soon as possible: Allergic reactions--skin rash, itching, hives, swelling of the face, lips, tongue, or throat Bleeding--bloody or black, tar-like stools, vomiting blood or brown material that looks like coffee grounds, red or dark brown urine, small red or purple spots on skin, unusual bruising or bleeding Blood clot--pain, swelling, or warmth in the leg, shortness of breath, chest pain Dizziness, loss of balance or coordination, confusion or trouble speaking Heart attack--pain or tightness in the chest, shoulders, arms, or jaw, nausea, shortness of breath, cold or clammy skin, feeling faint or lightheaded Heart failure--shortness of breath, swelling of the ankles, feet, or hands, sudden weight gain, unusual weakness or fatigue Heart rhythm changes--fast or irregular heartbeat, dizziness, feeling faint or lightheaded, chest pain, trouble breathing Increase in blood pressure Infection--fever, chills, cough, sore throat, wounds that don't heal, pain or trouble  when passing urine, general feeling of discomfort or being unwell Infusion reactions--chest pain, shortness of breath or trouble breathing, feeling faint or lightheaded Kidney injury--decrease in the amount of urine, swelling of the ankles, hands, or feet Liver injury--right upper belly pain, loss of appetite, nausea, light-colored stool, dark yellow or brown urine, yellowing skin or eyes, unusual weakness or fatigue Lung injury--shortness of breath or trouble breathing, cough, spitting up blood, chest pain, fever Pulmonary  hypertension--shortness of breath, chest pain, fast or irregular heartbeat, feeling faint or lightheaded, fatigue, swelling of the ankles or feet Stomach pain, bloody diarrhea, pale skin, unusual weakness or fatigue, decrease in the amount of urine, which may be signs of hemolytic uremic syndrome Sudden and severe headache, confusion, change in vision, seizures, which may be signs of posterior reversible encephalopathy syndrome (PRES) TTP--purple spots on the skin or inside the mouth, pale skin, yellowing skin or eyes, unusual weakness or fatigue, fever, fast or irregular heartbeat, confusion, change in vision, trouble speaking, trouble walking Tumor lysis syndrome (TLS)--nausea, vomiting, diarrhea, decrease in the amount of urine, dark urine, unusual weakness or fatigue, confusion, muscle pain or cramps, fast or irregular heartbeat, joint pain Side effects that usually do not require medical attention (report to your care team if they continue or are bothersome): Diarrhea Fatigue Nausea Trouble sleeping This list may not describe all possible side effects. Call your doctor for medical advice about side effects. You may report side effects to FDA at 1-800-FDA-1088. Where should I keep my medication? This medication is given in a hospital or clinic. It will not be stored at home. NOTE: This sheet is a summary. It may not cover all possible information. If you have questions about this medicine, talk to your doctor, pharmacist, or health care provider.  2023 Elsevier/Gold Standard (2020-04-15 00:00:00) Zoledronic Acid Injection (Cancer) What is this medication? ZOLEDRONIC ACID (ZOE le dron ik AS id) treats high calcium levels in the blood caused by cancer. It may also be used with chemotherapy to treat weakened bones caused by cancer. It works by slowing down the release of calcium from bones. This lowers calcium levels in your blood. It also makes your bones stronger and less likely to break  (fracture). It belongs to a group of medications called bisphosphonates. This medicine may be used for other purposes; ask your health care provider or pharmacist if you have questions. COMMON BRAND NAME(S): Zometa, Zometa Powder What should I tell my care team before I take this medication? They need to know if you have any of these conditions: Dehydration Dental disease Kidney disease Liver disease Low levels of calcium in the blood Lung or breathing disease, such as asthma Receiving steroids, such as dexamethasone or prednisone An unusual or allergic reaction to zoledronic acid, other medications, foods, dyes, or preservatives Pregnant or trying to get pregnant Breast-feeding How should I use this medication? This medication is injected into a vein. It is given by your care team in a hospital or clinic setting. Talk to your care team about the use of this medication in children. Special care may be needed. Overdosage: If you think you have taken too much of this medicine contact a poison control center or emergency room at once. NOTE: This medicine is only for you. Do not share this medicine with others. What if I miss a dose? Keep appointments for follow-up doses. It is important not to miss your dose. Call your care team if you are unable to keep an appointment. What may  interact with this medication? Certain antibiotics given by injection Diuretics, such as bumetanide, furosemide NSAIDs, medications for pain and inflammation, such as ibuprofen or naproxen Teriparatide Thalidomide This list may not describe all possible interactions. Give your health care provider a list of all the medicines, herbs, non-prescription drugs, or dietary supplements you use. Also tell them if you smoke, drink alcohol, or use illegal drugs. Some items may interact with your medicine. What should I watch for while using this medication? Visit your care team for regular checks on your progress. It may be  some time before you see the benefit from this medication. Some people who take this medication have severe bone, joint, or muscle pain. This medication may also increase your risk for jaw problems or a broken thigh bone. Tell your care team right away if you have severe pain in your jaw, bones, joints, or muscles. Tell you care team if you have any pain that does not go away or that gets worse. Tell your dentist and dental surgeon that you are taking this medication. You should not have major dental surgery while on this medication. See your dentist to have a dental exam and fix any dental problems before starting this medication. Take good care of your teeth while on this medication. Make sure you see your dentist for regular follow-up appointments. You should make sure you get enough calcium and vitamin D while you are taking this medication. Discuss the foods you eat and the vitamins you take with your care team. Check with your care team if you have severe diarrhea, nausea, and vomiting, or if you sweat a lot. The loss of too much body fluid may make it dangerous for you to take this medication. You may need bloodwork while taking this medication. Talk to your care team if you wish to become pregnant or think you might be pregnant. This medication can cause serious birth defects. What side effects may I notice from receiving this medication? Side effects that you should report to your care team as soon as possible: Allergic reactions--skin rash, itching, hives, swelling of the face, lips, tongue, or throat Kidney injury--decrease in the amount of urine, swelling of the ankles, hands, or feet Low calcium level--muscle pain or cramps, confusion, tingling, or numbness in the hands or feet Osteonecrosis of the jaw--pain, swelling, or redness in the mouth, numbness of the jaw, poor healing after dental work, unusual discharge from the mouth, visible bones in the mouth Severe bone, joint, or muscle  pain Side effects that usually do not require medical attention (report to your care team if they continue or are bothersome): Constipation Fatigue Fever Loss of appetite Nausea Stomach pain This list may not describe all possible side effects. Call your doctor for medical advice about side effects. You may report side effects to FDA at 1-800-FDA-1088. Where should I keep my medication? This medication is given in a hospital or clinic. It will not be stored at home. NOTE: This sheet is a summary. It may not cover all possible information. If you have questions about this medicine, talk to your doctor, pharmacist, or health care provider.  2023 Elsevier/Gold Standard (2007-04-27 00:00:00)

## 2022-06-21 ENCOUNTER — Ambulatory Visit: Payer: BLUE CROSS/BLUE SHIELD | Admitting: Oncology

## 2022-06-21 ENCOUNTER — Ambulatory Visit: Payer: BLUE CROSS/BLUE SHIELD

## 2022-06-21 ENCOUNTER — Other Ambulatory Visit: Payer: BLUE CROSS/BLUE SHIELD

## 2022-06-21 LAB — KAPPA/LAMBDA LIGHT CHAINS
Kappa free light chain: 4.6 mg/L (ref 3.3–19.4)
Kappa, lambda light chain ratio: 1.84 — ABNORMAL HIGH (ref 0.26–1.65)
Lambda free light chains: 2.5 mg/L — ABNORMAL LOW (ref 5.7–26.3)

## 2022-06-22 ENCOUNTER — Other Ambulatory Visit: Payer: Self-pay

## 2022-07-06 ENCOUNTER — Other Ambulatory Visit (HOSPITAL_COMMUNITY): Payer: Self-pay

## 2022-07-10 ENCOUNTER — Encounter: Payer: Self-pay | Admitting: *Deleted

## 2022-07-10 NOTE — Progress Notes (Signed)
Received form for property tax exclusion forwarded to this office from Dr. Anne Hahn at [facility] who declines to complete this form. As in past, Dr. Truett Perna will not complete this form due to it requires documentation that his disability is permanent.

## 2022-07-19 ENCOUNTER — Other Ambulatory Visit: Payer: Self-pay | Admitting: Nurse Practitioner

## 2022-07-19 DIAGNOSIS — C9000 Multiple myeloma not having achieved remission: Secondary | ICD-10-CM

## 2022-07-20 ENCOUNTER — Other Ambulatory Visit: Payer: Self-pay | Admitting: Nurse Practitioner

## 2022-07-20 DIAGNOSIS — C9000 Multiple myeloma not having achieved remission: Secondary | ICD-10-CM

## 2022-07-20 MED ORDER — LORAZEPAM 0.5 MG PO TABS
ORAL_TABLET | ORAL | 0 refills | Status: DC
Start: 2022-07-20 — End: 2022-08-22

## 2022-07-20 MED ORDER — OXYCODONE-ACETAMINOPHEN 5-325 MG PO TABS
1.0000 | ORAL_TABLET | Freq: Four times a day (QID) | ORAL | 0 refills | Status: DC | PRN
Start: 2022-07-20 — End: 2022-08-22

## 2022-08-08 ENCOUNTER — Inpatient Hospital Stay: Payer: BLUE CROSS/BLUE SHIELD | Admitting: Oncology

## 2022-08-11 ENCOUNTER — Other Ambulatory Visit: Payer: Self-pay

## 2022-08-16 ENCOUNTER — Other Ambulatory Visit: Payer: Self-pay | Admitting: Oncology

## 2022-08-16 DIAGNOSIS — C9000 Multiple myeloma not having achieved remission: Secondary | ICD-10-CM

## 2022-08-17 ENCOUNTER — Other Ambulatory Visit: Payer: Self-pay

## 2022-08-21 ENCOUNTER — Other Ambulatory Visit: Payer: Self-pay | Admitting: Nurse Practitioner

## 2022-08-21 DIAGNOSIS — C9000 Multiple myeloma not having achieved remission: Secondary | ICD-10-CM

## 2022-08-22 ENCOUNTER — Inpatient Hospital Stay: Payer: BLUE CROSS/BLUE SHIELD | Attending: Oncology

## 2022-08-22 ENCOUNTER — Encounter: Payer: Self-pay | Admitting: Oncology

## 2022-08-22 ENCOUNTER — Inpatient Hospital Stay: Payer: BLUE CROSS/BLUE SHIELD

## 2022-08-22 ENCOUNTER — Encounter: Payer: Self-pay | Admitting: *Deleted

## 2022-08-22 ENCOUNTER — Inpatient Hospital Stay (HOSPITAL_BASED_OUTPATIENT_CLINIC_OR_DEPARTMENT_OTHER): Payer: BLUE CROSS/BLUE SHIELD | Admitting: Oncology

## 2022-08-22 VITALS — BP 120/83 | HR 76 | Resp 16

## 2022-08-22 VITALS — BP 129/81 | HR 87 | Temp 98.1°F | Resp 18 | Ht 68.0 in | Wt 177.7 lb

## 2022-08-22 DIAGNOSIS — C9000 Multiple myeloma not having achieved remission: Secondary | ICD-10-CM | POA: Insufficient documentation

## 2022-08-22 DIAGNOSIS — Z5112 Encounter for antineoplastic immunotherapy: Secondary | ICD-10-CM | POA: Insufficient documentation

## 2022-08-22 DIAGNOSIS — D649 Anemia, unspecified: Secondary | ICD-10-CM | POA: Diagnosis not present

## 2022-08-22 DIAGNOSIS — Z79899 Other long term (current) drug therapy: Secondary | ICD-10-CM | POA: Insufficient documentation

## 2022-08-22 DIAGNOSIS — J449 Chronic obstructive pulmonary disease, unspecified: Secondary | ICD-10-CM | POA: Diagnosis not present

## 2022-08-22 LAB — CMP (CANCER CENTER ONLY)
ALT: 9 U/L (ref 0–44)
AST: 9 U/L — ABNORMAL LOW (ref 15–41)
Albumin: 4.5 g/dL (ref 3.5–5.0)
Alkaline Phosphatase: 78 U/L (ref 38–126)
Anion gap: 11 (ref 5–15)
BUN: 18 mg/dL (ref 6–20)
CO2: 22 mmol/L (ref 22–32)
Calcium: 9.8 mg/dL (ref 8.9–10.3)
Chloride: 104 mmol/L (ref 98–111)
Creatinine: 0.92 mg/dL (ref 0.61–1.24)
GFR, Estimated: >60 mL/min (ref >60)
Glucose, Bld: 112 mg/dL — ABNORMAL HIGH (ref 70–99)
Potassium: 3.7 mmol/L (ref 3.5–5.1)
Sodium: 137 mmol/L (ref 135–145)
Total Bilirubin: 0.4 mg/dL (ref 0.3–1.2)
Total Protein: 6.9 g/dL (ref 6.5–8.1)

## 2022-08-22 LAB — CBC WITH DIFFERENTIAL (CANCER CENTER ONLY)
Abs Immature Granulocytes: 0.03 10*3/uL (ref 0.00–0.07)
Basophils Absolute: 0.1 10*3/uL (ref 0.0–0.1)
Basophils Relative: 1 %
Eosinophils Absolute: 0.2 10*3/uL (ref 0.0–0.5)
Eosinophils Relative: 2 %
HCT: 41.6 % (ref 39.0–52.0)
Hemoglobin: 14.4 g/dL (ref 13.0–17.0)
Immature Granulocytes: 0 %
Lymphocytes Relative: 8 %
Lymphs Abs: 0.9 10*3/uL (ref 0.7–4.0)
MCH: 32.8 pg (ref 26.0–34.0)
MCHC: 34.6 g/dL (ref 30.0–36.0)
MCV: 94.8 fL (ref 80.0–100.0)
Monocytes Absolute: 0.9 10*3/uL (ref 0.1–1.0)
Monocytes Relative: 7 %
Neutro Abs: 9.8 10*3/uL — ABNORMAL HIGH (ref 1.7–7.7)
Neutrophils Relative %: 82 %
Platelet Count: 271 10*3/uL (ref 150–400)
RBC: 4.39 MIL/uL (ref 4.22–5.81)
RDW: 13.7 % (ref 11.5–15.5)
WBC Count: 11.9 10*3/uL — ABNORMAL HIGH (ref 4.0–10.5)
nRBC: 0 % (ref 0.0–0.2)

## 2022-08-22 MED ORDER — SODIUM CHLORIDE 0.9 % IV SOLN: Freq: Once | INTRAVENOUS | Status: AC

## 2022-08-22 MED ORDER — OXYCODONE-ACETAMINOPHEN 5-325 MG PO TABS
1.00 | ORAL_TABLET | Freq: Four times a day (QID) | ORAL | 0 refills | Status: DC | PRN
Start: 2022-08-22 — End: 2022-09-22

## 2022-08-22 MED ORDER — DEXTROSE 5 % IV SOLN
56.0000 mg/m2 | Freq: Once | INTRAVENOUS | Status: AC
  Administered 2022-08-22: 110 mg via INTRAVENOUS
  Filled 2022-08-22: qty 30

## 2022-08-22 MED ORDER — LORAZEPAM 0.5 MG PO TABS
ORAL_TABLET | ORAL | 0 refills | Status: DC
Start: 2022-08-22 — End: 2022-09-22

## 2022-08-22 NOTE — Patient Instructions (Signed)
[facility]   Discharge Instructions: Thank you for choosing [facility] to provide your oncology and hematology care.   If you have a lab appointment with the Cancer Center, please go directly to the Cancer Center and check in at the registration area.   Wear comfortable clothing and clothing appropriate for easy access to any Portacath or PICC line.   We strive to give you quality time with your provider. You may need to reschedule your appointment if you arrive late (15 or more minutes).  Arriving late affects you and other patients whose appointments are after yours.  Also, if you miss three or more appointments without notifying the office, you may be dismissed from the clinic at the provider's discretion.      For prescription refill requests, have your pharmacy contact our office and allow 72 hours for refills to be completed.    Today you received the following chemotherapy and/or immunotherapy agents Carfilzomib (KYPROLIS).      To help prevent nausea and vomiting after your treatment, we encourage you to take your nausea medication as directed.  BELOW ARE SYMPTOMS THAT SHOULD BE REPORTED IMMEDIATELY: *FEVER GREATER THAN 100.4 F (38 C) OR HIGHER *CHILLS OR SWEATING *NAUSEA AND VOMITING THAT IS NOT CONTROLLED WITH YOUR NAUSEA MEDICATION *UNUSUAL SHORTNESS OF BREATH *UNUSUAL BRUISING OR BLEEDING *URINARY PROBLEMS (pain or burning when urinating, or frequent urination) *BOWEL PROBLEMS (unusual diarrhea, constipation, pain near the anus) TENDERNESS IN MOUTH AND THROAT WITH OR WITHOUT PRESENCE OF ULCERS (sore throat, sores in mouth, or a toothache) UNUSUAL RASH, SWELLING OR PAIN  UNUSUAL VAGINAL DISCHARGE OR ITCHING   Items with * indicate a potential emergency and should be followed up as soon as possible or go to the Emergency Department if any problems should occur.  Please show the CHEMOTHERAPY ALERT CARD or IMMUNOTHERAPY ALERT  CARD at check-in to the Emergency Department and triage nurse.  Should you have questions after your visit or need to cancel or reschedule your appointment, please contact [facility]  Dept: [phone]  and follow the prompts.  Office hours are 8:00 a.m. to 4:30 p.m. Monday - Friday. Please note that voicemails left after 4:00 p.m. may not be returned until the following business day.  We are closed weekends and major holidays. You have access to a nurse at all times for urgent questions. Please call the main number to the clinic Dept: [phone] and follow the prompts.   For any non-urgent questions, you may also contact your provider using MyChart. We now offer e-Visits for anyone 61 and older to request care online for non-urgent symptoms. For details visit [email or web address].   Also download the MyChart app! Go to the app store, search "MyChart", open the app, select [facility], and log in with your MyChart username and password.  Carfilzomib Injection What is this medication? CARFILZOMIB (kar FILZ oh mib) treats multiple myeloma, a type of bone marrow cancer. It works by blocking a protein that causes cancer cells to grow and multiply. This helps to slow or stop the spread of cancer cells. This medicine may be used for other purposes; ask your health care provider or pharmacist if you have questions. COMMON BRAND NAME(S): KYPROLIS What should I tell my care team before I take this medication? They need to know if you have any of these conditions: Heart disease History of blood clots Irregular heartbeat Kidney disease Liver disease Lung or breathing  disease An unusual or allergic reaction to carfilzomib, or other medications, foods, dyes, or preservatives If you or your partner are pregnant or trying to get pregnant Breastfeeding How should I use this medication? This medication is injected into a vein. It is given by your care team in a  hospital or clinic setting. Talk to your care team about the use of this medication in children. Special care may be needed. Overdosage: If you think you have taken too much of this medicine contact a poison control center or emergency room at once. NOTE: This medicine is only for you. Do not share this medicine with others. What if I miss a dose? Keep appointments for follow-up doses. It is important not to miss your dose. Call your care team if you are unable to keep an appointment. What may interact with this medication? Interactions are not expected. This list may not describe all possible interactions. Give your health care provider a list of all the medicines, herbs, non-prescription drugs, or dietary supplements you use. Also tell them if you smoke, drink alcohol, or use illegal drugs. Some items may interact with your medicine. What should I watch for while using this medication? Your condition will be monitored carefully while you are receiving this medication. You may need blood work while taking this medication. Check with your care team if you have severe diarrhea, nausea, and vomiting, or if you sweat a lot. The loss of too much body fluid may make it dangerous for you to take this medication. This medication may affect your coordination, reaction time, or judgment. Do not drive or operate machinery until you know how this medication affects you. Sit up or stand slowly to reduce the risk of dizzy or fainting spells. Drinking alcohol with this medication can increase the risk of these side effects. Talk to your care team if you may be pregnant. Serious birth defects can occur if you take this medication during pregnancy and for 6 months after the last dose. You will need a negative pregnancy test before starting this medication. Contraception is recommended while taking this medication and for 6 months after the last dose. Your care team can help you find an option that works for you. If  your partner can get pregnant, use a condom during sex while taking this medication and for 3 months after the last dose. Do not breastfeed while taking this medication and for 2 weeks after the last dose. This medication may cause infertility. Talk to your care team if you are concerned about your fertility. What side effects may I notice from receiving this medication? Side effects that you should report to your care team as soon as possible: Allergic reactions--skin rash, itching, hives, swelling of the face, lips, tongue, or throat Bleeding--bloody or black, tar-like stools, vomiting blood or brown material that looks like coffee grounds, red or dark brown urine, small red or purple spots on skin, unusual bruising or bleeding Blood clot--pain, swelling, or warmth in the leg, shortness of breath, chest pain Dizziness, loss of balance or coordination, confusion or trouble speaking Heart attack--pain or tightness in the chest, shoulders, arms, or jaw, nausea, shortness of breath, cold or clammy skin, feeling faint or lightheaded Heart failure--shortness of breath, swelling of the ankles, feet, or hands, sudden weight gain, unusual weakness or fatigue Heart rhythm changes--fast or irregular heartbeat, dizziness, feeling faint or lightheaded, chest pain, trouble breathing Increase in blood pressure Infection--fever, chills, cough, sore throat, wounds that don't heal, pain or  trouble when passing urine, general feeling of discomfort or being unwell Infusion reactions--chest pain, shortness of breath or trouble breathing, feeling faint or lightheaded Kidney injury--decrease in the amount of urine, swelling of the ankles, hands, or feet Liver injury--right upper belly pain, loss of appetite, nausea, light-colored stool, dark yellow or brown urine, yellowing skin or eyes, unusual weakness or fatigue Lung injury--shortness of breath or trouble breathing, cough, spitting up blood, chest pain,  fever Pulmonary hypertension--shortness of breath, chest pain, fast or irregular heartbeat, feeling faint or lightheaded, fatigue, swelling of the ankles or feet Stomach pain, bloody diarrhea, pale skin, unusual weakness or fatigue, decrease in the amount of urine, which may be signs of hemolytic uremic syndrome Sudden and severe headache, confusion, change in vision, seizures, which may be signs of posterior reversible encephalopathy syndrome (PRES) TTP--purple spots on the skin or inside the mouth, pale skin, yellowing skin or eyes, unusual weakness or fatigue, fever, fast or irregular heartbeat, confusion, change in vision, trouble speaking, trouble walking Tumor lysis syndrome (TLS)--nausea, vomiting, diarrhea, decrease in the amount of urine, dark urine, unusual weakness or fatigue, confusion, muscle pain or cramps, fast or irregular heartbeat, joint pain Side effects that usually do not require medical attention (report to your care team if they continue or are bothersome): Diarrhea Fatigue Nausea Trouble sleeping This list may not describe all possible side effects. Call your doctor for medical advice about side effects. You may report side effects to FDA at 1-800-FDA-1088. Where should I keep my medication? This medication is given in a hospital or clinic. It will not be stored at home. NOTE: This sheet is a summary. It may not cover all possible information. If you have questions about this medicine, talk to your doctor, pharmacist, or health care provider.  2024 Elsevier/Gold Standard (2021-08-04 00:00:00)

## 2022-08-22 NOTE — Progress Notes (Signed)
[facility] OFFICE PROGRESS NOTE   Diagnosis: Multiple myeloma  INTERVAL HISTORY:   [name] was last treated with carfilzomib in on 06/20/2022.  He has undergone a pretransplant evaluation and stem cell collection at [facility].  He underwent stem cell mobilization beginning 08/05/2022 and stem cell collection on 08/09/2022.  The stem cell transplant is being scheduled for July.  We were contacted Jordan King to request to continue carfilzomib maintenance in the interim.  [name] continues to have low back pain.  He continues smoking.  Objective:  Vital signs in last 24 hours:  Blood pressure 129/81, pulse 87, temperature 98.1 F (36.7 C), temperature source Oral, resp. rate 18, height 5\' 8"  (1.727 m), weight 177 lb 11.2 oz (80.6 kg), SpO2 100 %.    HEENT: No thrush Resp: Lungs clear bilaterally Cardio: Regular rate and rhythm GI: No hepatosplenomegaly Vascular: No leg edema   Lab Results:  Lab Results  Component Value Date   WBC 11.9 (H) 08/22/2022   HGB 14.4 08/22/2022   HCT 41.6 08/22/2022   MCV 94.8 08/22/2022   PLT 271 08/22/2022   NEUTROABS 9.8 (H) 08/22/2022    CMP  Lab Results  Component Value Date   NA 137 08/22/2022   K 3.7 08/22/2022   CL 104 08/22/2022   CO2 22 08/22/2022   GLUCOSE 112 (H) 08/22/2022   BUN 18 08/22/2022   CREATININE 0.92 08/22/2022   CALCIUM 9.8 08/22/2022   PROT 6.9 08/22/2022   ALBUMIN 4.5 08/22/2022   AST 9 (L) 08/22/2022   ALT 9 08/22/2022   ALKPHOS 78 08/22/2022   BILITOT 0.4 08/22/2022   GFRNONAA >60 08/22/2022     Medications: I have reviewed the patient's current medications.   Assessment/Plan: Multiple myeloma -05/22/2021-kappa free light chain 11.3, lambda free light chain 17,028 -25 g proteinuria, 20 g lambda light chains -Serum immunofixation 05/22/2021-monoclonal lambda light chains -Bone marrow biopsy 05/23/2021-hypercellular marrow involved by plasma cell myeloma, 80-90% plasma cells on the bone  marrow biopsy, diminished iron stains -Cytogenetics-45 X, minus Y, t11:14 (11 q13) -Cycle 1 Velcade/Decadron 05/23/2021 (Decadron given 05/22/2021) -Cycle 1 DRVd 06/13/2021, treatment held 06/20/2021 due to a rash, Revlimid resumed 06/28/2021, daratumumab resumed 07/04/2021 Revlimid resumed 06/28/2021 Cycle 2 daratumumab/Revlimid/Decadron/Velcade 07/18/2021 (Revlimid started 07/19/2021, placed on hold 07/25/2021 secondary to a rash-last taken 07/24/2021) Treatment continued with weekly Velcade/Decadron and daratumumab Treatment changed to pomalidomide, daratumumab, Decadron 10/03/2021 Pomalidomide placed on hold 10/17/2021 due to a rash Pomalidomide resumed at a reduced dose of 2 mg daily for 21 days followed by 7-day break 10/31/2021 Pomalidomide 2 mg daily for 21 days beginning 11/28/2021 Lambda light chains increased 12/12/2021 Venetoclax/Carfilzomib/Decadron 12/26/2021, venetoclax started 01/05/2022 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 CARFILZOMIB/Decadron weekly x3 beginning 01/16/2022 Venetoclax resumed 01/19/2022 100 mg daily Carfilzomib/Decadron weekly x 3 beginning 02/13/2022 (day 15 not given) Carfilzomib /Decadron weekly x 3 beginning 03/06/2022 Carfilzomib/Decadron weekly x 3 beginning 04/04/2022 Carfilzomib/Decadron weekly x 3 beginning 05/09/2022 Carfilzomib/Decadron weekly x 3 beginning 06/07/2022 Last carfilzomib 06/20/2022 PET 06/29/2022-no evidence of active myeloma healing right anterior fourth rib fracture Bone marrow biopsy 07/06/2022-mildly hypocellular bone marrow (20%), no increase in plasma cells, MRD negative Normal kappa and lambda light chains 08/03/2022   Renal failure secondary to #1-improved Hypercalcemia-status post treat with calcitonin, pamidronate 05/22/2021-resolved Lytic bone lesions Anemia Thrombocytopenia Hypocalcemia-IV calcium gluconate 05/30/2021, resolved Rash 06/20/2021,  consistent with a drug rash, recurrent rash 07/24/2021-Revlimid placed on  hold; recurrent rash 10/17/2021 Admission 01/09/2022 with renal  failure, elevated uric acid, rasburicase 01/09/2022 Wheezing/cough-likely secondary to COPD and asthma      Disposition: Jordan King appears stable.  He has been maintained off carfilzomib/Decadron and venetoclax for the past few months.  A restaging evaluation at [facility] reveals no evidence of persistent myeloma.  He has undergone stem cell collection and is being scheduled for transplant next month.  Jordan King request we continue carfilzomib in the interim.  He will begin weekly carfilzomib/Decadron today.  I recommended he resume Valtrex prophylaxis.  He will return for an office visit in 2 weeks.  He will be due for Zometa in early July.  We refilled prescriptions for oxycodone and lorazepam.    Jordan Papas, MD  08/22/2022  10:48 AM

## 2022-08-22 NOTE — Progress Notes (Signed)
Patient seen by Dr. Truett Perna today  Vitals are within treatment parameters.No intervention for BP 115/81  Labs reviewed by Dr. Truett Perna and are within treatment parameters.  Per physician team, patient is ready for treatment and there are NO modifications to the treatment plan.  He will take his dexamethasone at home per usual

## 2022-08-23 ENCOUNTER — Other Ambulatory Visit: Payer: Self-pay

## 2022-08-23 ENCOUNTER — Encounter: Payer: Self-pay | Admitting: *Deleted

## 2022-08-23 NOTE — Progress Notes (Signed)
Per Jordan King, his last dose of venetoclax was 06/23/2022.

## 2022-08-29 ENCOUNTER — Inpatient Hospital Stay: Payer: BLUE CROSS/BLUE SHIELD

## 2022-09-05 ENCOUNTER — Encounter: Payer: Self-pay | Admitting: Nurse Practitioner

## 2022-09-05 ENCOUNTER — Inpatient Hospital Stay: Payer: BLUE CROSS/BLUE SHIELD

## 2022-09-05 ENCOUNTER — Inpatient Hospital Stay (HOSPITAL_BASED_OUTPATIENT_CLINIC_OR_DEPARTMENT_OTHER): Payer: BLUE CROSS/BLUE SHIELD | Admitting: Nurse Practitioner

## 2022-09-05 ENCOUNTER — Inpatient Hospital Stay: Payer: BLUE CROSS/BLUE SHIELD | Admitting: Nurse Practitioner

## 2022-09-05 ENCOUNTER — Inpatient Hospital Stay: Payer: BLUE CROSS/BLUE SHIELD | Admitting: Licensed Clinical Social Worker

## 2022-09-05 VITALS — BP 114/92 | HR 96 | Temp 98.1°F | Resp 18 | Ht 68.0 in | Wt 177.7 lb

## 2022-09-05 VITALS — BP 131/83 | HR 83 | Resp 18

## 2022-09-05 DIAGNOSIS — C9000 Multiple myeloma not having achieved remission: Secondary | ICD-10-CM

## 2022-09-05 LAB — CBC WITH DIFFERENTIAL (CANCER CENTER ONLY)
Abs Immature Granulocytes: 0.06 10*3/uL (ref 0.00–0.07)
Basophils Absolute: 0 10*3/uL (ref 0.0–0.1)
Basophils Relative: 0 %
Eosinophils Absolute: 0.1 10*3/uL (ref 0.0–0.5)
Eosinophils Relative: 1 %
HCT: 44.4 % (ref 39.0–52.0)
Hemoglobin: 15.4 g/dL (ref 13.0–17.0)
Immature Granulocytes: 0 %
Lymphocytes Relative: 3 %
Lymphs Abs: 0.4 10*3/uL — ABNORMAL LOW (ref 0.7–4.0)
MCH: 32.8 pg (ref 26.0–34.0)
MCHC: 34.7 g/dL (ref 30.0–36.0)
MCV: 94.7 fL (ref 80.0–100.0)
Monocytes Absolute: 0.2 10*3/uL (ref 0.1–1.0)
Monocytes Relative: 1 %
Neutro Abs: 13.2 10*3/uL — ABNORMAL HIGH (ref 1.7–7.7)
Neutrophils Relative %: 95 %
Platelet Count: 252 10*3/uL (ref 150–400)
RBC: 4.69 MIL/uL (ref 4.22–5.81)
RDW: 13.8 % (ref 11.5–15.5)
WBC Count: 13.9 10*3/uL — ABNORMAL HIGH (ref 4.0–10.5)
nRBC: 0 % (ref 0.0–0.2)

## 2022-09-05 LAB — CMP (CANCER CENTER ONLY)
ALT: 11 U/L (ref 0–44)
AST: 10 U/L — ABNORMAL LOW (ref 15–41)
Albumin: 4.5 g/dL (ref 3.5–5.0)
Alkaline Phosphatase: 68 U/L (ref 38–126)
Anion gap: 12 (ref 5–15)
BUN: 19 mg/dL (ref 6–20)
CO2: 22 mmol/L (ref 22–32)
Calcium: 10.4 mg/dL — ABNORMAL HIGH (ref 8.9–10.3)
Chloride: 102 mmol/L (ref 98–111)
Creatinine: 1.03 mg/dL (ref 0.61–1.24)
GFR, Estimated: >60 mL/min (ref >60)
Glucose, Bld: 129 mg/dL — ABNORMAL HIGH (ref 70–99)
Potassium: 3.9 mmol/L (ref 3.5–5.1)
Sodium: 136 mmol/L (ref 135–145)
Total Bilirubin: 0.3 mg/dL (ref 0.3–1.2)
Total Protein: 7.2 g/dL (ref 6.5–8.1)

## 2022-09-05 MED ORDER — DEXTROSE 5 % IV SOLN
56.0000 mg/m2 | Freq: Once | INTRAVENOUS | Status: AC
  Administered 2022-09-05: 110 mg via INTRAVENOUS
  Filled 2022-09-05: qty 30

## 2022-09-05 MED ORDER — SODIUM CHLORIDE 0.9 % IV SOLN: Freq: Once | INTRAVENOUS | Status: AC

## 2022-09-05 NOTE — Progress Notes (Signed)
[facility] CSW Progress Note  Visual merchandiser met with patient per his request while was in infusion.  He said due to his fixed income, he is experiencing financial strain and asked about the visa gift cards.  Informed him that the program no longer existed, but that he could apply for the [employer].  He agreed and CSW made the referral to Merla Riches.  He also received a bag of food from the food pantry.  Patient appeared to respond positively to engaging with CSW.  He expressed no other needs.    Dorothey Baseman, LCSW Clinical Social Worker [facility]

## 2022-09-05 NOTE — Progress Notes (Signed)
[facility] OFFICE PROGRESS NOTE   Diagnosis: Multiple myeloma  INTERVAL HISTORY:   [name] returns for follow-up.  He completed a treatment with Carfilzomib 08/22/2022.  He canceled on his appointment for Carfilzomib last week due to diarrhea after eating at a restaurant.  The diarrhea has resolved.  He denies nausea/vomiting.  No mouth sores.  He has stable dyspnea on exertion.  No cough or fever.  Around 2 weeks ago he had a temperature of 102.5 after prolonged sun exposure.  The temperature resolved without intervention and has not recurred.  Objective:  Vital signs in last 24 hours:  Blood pressure (!) 114/92, pulse 96, temperature 98.1 F (36.7 C), temperature source Oral, resp. rate 18, height 5\' 8"  (1.727 m), weight 177 lb 11.2 oz (80.6 kg), SpO2 98 %.    HEENT: No thrush or ulcers. Resp: Lungs clear bilaterally. Cardio: Regular rate and rhythm. GI: No hepatosplenomegaly. Vascular: No leg edema.    Lab Results:  Lab Results  Component Value Date   WBC 13.9 (H) 09/05/2022   HGB 15.4 09/05/2022   HCT 44.4 09/05/2022   MCV 94.7 09/05/2022   PLT 252 09/05/2022   NEUTROABS 13.2 (H) 09/05/2022    Imaging:  No results found.  Medications: I have reviewed the patient's current medications.  Assessment/Plan: Multiple myeloma -05/22/2021-kappa free light chain 11.3, lambda free light chain 17,028 -25 g proteinuria, 20 g lambda light chains -Serum immunofixation 05/22/2021-monoclonal lambda light chains -Bone marrow biopsy 05/23/2021-hypercellular marrow involved by plasma cell myeloma, 80-90% plasma cells on the bone marrow biopsy, diminished iron stains -Cytogenetics-45 X, minus Y, t11:14 (11 q13) -Cycle 1 Velcade/Decadron 05/23/2021 (Decadron given 05/22/2021) -Cycle 1 DRVd 06/13/2021, treatment held 06/20/2021 due to a rash, Revlimid resumed 06/28/2021, daratumumab resumed 07/04/2021 Revlimid resumed 06/28/2021 Cycle 2 daratumumab/Revlimid/Decadron/Velcade  07/18/2021 (Revlimid started 07/19/2021, placed on hold 07/25/2021 secondary to a rash-last taken 07/24/2021) Treatment continued with weekly Velcade/Decadron and daratumumab Treatment changed to pomalidomide, daratumumab, Decadron 10/03/2021 Pomalidomide placed on hold 10/17/2021 due to a rash Pomalidomide resumed at a reduced dose of 2 mg daily for 21 days followed by 7-day break 10/31/2021 Pomalidomide 2 mg daily for 21 days beginning 11/28/2021 Lambda light chains increased 12/12/2021 Venetoclax/Carfilzomib/Decadron 12/26/2021, venetoclax started 01/05/2022 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 CARFILZOMIB/Decadron weekly x3 beginning 01/16/2022 Venetoclax resumed 01/19/2022 100 mg daily Carfilzomib/Decadron weekly x 3 beginning 02/13/2022 (day 15 not given) Carfilzomib /Decadron weekly x 3 beginning 03/06/2022 Carfilzomib/Decadron weekly x 3 beginning 04/04/2022 Carfilzomib/Decadron weekly x 3 beginning 05/09/2022 Carfilzomib/Decadron weekly x 3 beginning 06/07/2022 Last carfilzomib 06/20/2022 PET 06/29/2022-no evidence of active myeloma healing right anterior fourth rib fracture Bone marrow biopsy 07/06/2022-mildly hypocellular bone marrow (20%), no increase in plasma cells, MRD negative Normal kappa and lambda light chains 08/03/2022 Carfilzomib 08/22/2022   Renal failure secondary to #1-improved Hypercalcemia-status post treat with calcitonin, pamidronate 05/22/2021-resolved Lytic bone lesions Anemia Thrombocytopenia Hypocalcemia-IV calcium gluconate 05/30/2021, resolved Rash 06/20/2021,  consistent with a drug rash, recurrent rash 07/24/2021-Revlimid placed on hold; recurrent rash 10/17/2021 Admission 01/09/2022 with renal failure, elevated uric acid, rasburicase 01/09/2022 Wheezing/cough-likely secondary to COPD and asthma    Disposition: Mr. [name] appears stable.  He is receiving Carfilzomib maintenance prior to stem cell transplant scheduled for July.  Mr. [name] indicates  today will be his final dose of Carfilzomib.  Plan to proceed as scheduled.  CBC reviewed.  Counts adequate.  Chemistry panel is pending.  He had an isolated fever a few weeks ago.  He understands to contact the office with recurrent fever.  He will return for follow-up in approximately 2 months.  We are available to see him sooner if needed.    Lonna Cobb ANP/GNP-BC   09/05/2022  1:25 PM

## 2022-09-05 NOTE — Patient Instructions (Signed)
[facility]   Discharge Instructions: Thank you for choosing [facility] to provide your oncology and hematology care.   If you have a lab appointment with the Cancer Center, please go directly to the Cancer Center and check in at the registration area.   Wear comfortable clothing and clothing appropriate for easy access to any Portacath or PICC line.   We strive to give you quality time with your provider. You may need to reschedule your appointment if you arrive late (15 or more minutes).  Arriving late affects you and other patients whose appointments are after yours.  Also, if you miss three or more appointments without notifying the office, you may be dismissed from the clinic at the provider's discretion.      For prescription refill requests, have your pharmacy contact our office and allow 72 hours for refills to be completed.    Today you received the following chemotherapy and/or immunotherapy agents Carfilzomib (KYPROLIS).      To help prevent nausea and vomiting after your treatment, we encourage you to take your nausea medication as directed.  BELOW ARE SYMPTOMS THAT SHOULD BE REPORTED IMMEDIATELY: *FEVER GREATER THAN 100.4 F (38 C) OR HIGHER *CHILLS OR SWEATING *NAUSEA AND VOMITING THAT IS NOT CONTROLLED WITH YOUR NAUSEA MEDICATION *UNUSUAL SHORTNESS OF BREATH *UNUSUAL BRUISING OR BLEEDING *URINARY PROBLEMS (pain or burning when urinating, or frequent urination) *BOWEL PROBLEMS (unusual diarrhea, constipation, pain near the anus) TENDERNESS IN MOUTH AND THROAT WITH OR WITHOUT PRESENCE OF ULCERS (sore throat, sores in mouth, or a toothache) UNUSUAL RASH, SWELLING OR PAIN  UNUSUAL VAGINAL DISCHARGE OR ITCHING   Items with * indicate a potential emergency and should be followed up as soon as possible or go to the Emergency Department if any problems should occur.  Please show the CHEMOTHERAPY ALERT CARD or IMMUNOTHERAPY ALERT  CARD at check-in to the Emergency Department and triage nurse.  Should you have questions after your visit or need to cancel or reschedule your appointment, please contact [facility]  Dept: [phone]  and follow the prompts.  Office hours are 8:00 a.m. to 4:30 p.m. Monday - Friday. Please note that voicemails left after 4:00 p.m. may not be returned until the following business day.  We are closed weekends and major holidays. You have access to a nurse at all times for urgent questions. Please call the main number to the clinic Dept: [phone] and follow the prompts.   For any non-urgent questions, you may also contact your provider using MyChart. We now offer e-Visits for anyone 18 and older to request care online for non-urgent symptoms. For details visit [email or web address].   Also download the MyChart app! Go to the app store, search "MyChart", open the app, select [facility], and log in with your MyChart username and password.  Carfilzomib Injection What is this medication? CARFILZOMIB (kar FILZ oh mib) treats multiple myeloma, a type of bone marrow cancer. It works by blocking a protein that causes cancer cells to grow and multiply. This helps to slow or stop the spread of cancer cells. This medicine may be used for other purposes; ask your health care provider or pharmacist if you have questions. COMMON BRAND NAME(S): KYPROLIS What should I tell my care team before I take this medication? They need to know if you have any of these conditions: Heart disease History of blood clots Irregular heartbeat Kidney disease Liver disease Lung or breathing   disease An unusual or allergic reaction to carfilzomib, or other medications, foods, dyes, or preservatives If you or your partner are pregnant or trying to get pregnant Breastfeeding How should I use this medication? This medication is injected into a vein. It is given by your care team in a  hospital or clinic setting. Talk to your care team about the use of this medication in children. Special care may be needed. Overdosage: If you think you have taken too much of this medicine contact a poison control center or emergency room at once. NOTE: This medicine is only for you. Do not share this medicine with others. What if I miss a dose? Keep appointments for follow-up doses. It is important not to miss your dose. Call your care team if you are unable to keep an appointment. What may interact with this medication? Interactions are not expected. This list may not describe all possible interactions. Give your health care provider a list of all the medicines, herbs, non-prescription drugs, or dietary supplements you use. Also tell them if you smoke, drink alcohol, or use illegal drugs. Some items may interact with your medicine. What should I watch for while using this medication? Your condition will be monitored carefully while you are receiving this medication. You may need blood work while taking this medication. Check with your care team if you have severe diarrhea, nausea, and vomiting, or if you sweat a lot. The loss of too much body fluid may make it dangerous for you to take this medication. This medication may affect your coordination, reaction time, or judgment. Do not drive or operate machinery until you know how this medication affects you. Sit up or stand slowly to reduce the risk of dizzy or fainting spells. Drinking alcohol with this medication can increase the risk of these side effects. Talk to your care team if you may be pregnant. Serious birth defects can occur if you take this medication during pregnancy and for 6 months after the last dose. You will need a negative pregnancy test before starting this medication. Contraception is recommended while taking this medication and for 6 months after the last dose. Your care team can help you find an option that works for you. If  your partner can get pregnant, use a condom during sex while taking this medication and for 3 months after the last dose. Do not breastfeed while taking this medication and for 2 weeks after the last dose. This medication may cause infertility. Talk to your care team if you are concerned about your fertility. What side effects may I notice from receiving this medication? Side effects that you should report to your care team as soon as possible: Allergic reactions--skin rash, itching, hives, swelling of the face, lips, tongue, or throat Bleeding--bloody or black, tar-like stools, vomiting blood or brown material that looks like coffee grounds, red or dark brown urine, small red or purple spots on skin, unusual bruising or bleeding Blood clot--pain, swelling, or warmth in the leg, shortness of breath, chest pain Dizziness, loss of balance or coordination, confusion or trouble speaking Heart attack--pain or tightness in the chest, shoulders, arms, or jaw, nausea, shortness of breath, cold or clammy skin, feeling faint or lightheaded Heart failure--shortness of breath, swelling of the ankles, feet, or hands, sudden weight gain, unusual weakness or fatigue Heart rhythm changes--fast or irregular heartbeat, dizziness, feeling faint or lightheaded, chest pain, trouble breathing Increase in blood pressure Infection--fever, chills, cough, sore throat, wounds that don't heal, pain or   trouble when passing urine, general feeling of discomfort or being unwell Infusion reactions--chest pain, shortness of breath or trouble breathing, feeling faint or lightheaded Kidney injury--decrease in the amount of urine, swelling of the ankles, hands, or feet Liver injury--right upper belly pain, loss of appetite, nausea, light-colored stool, dark yellow or brown urine, yellowing skin or eyes, unusual weakness or fatigue Lung injury--shortness of breath or trouble breathing, cough, spitting up blood, chest pain,  fever Pulmonary hypertension--shortness of breath, chest pain, fast or irregular heartbeat, feeling faint or lightheaded, fatigue, swelling of the ankles or feet Stomach pain, bloody diarrhea, pale skin, unusual weakness or fatigue, decrease in the amount of urine, which may be signs of hemolytic uremic syndrome Sudden and severe headache, confusion, change in vision, seizures, which may be signs of posterior reversible encephalopathy syndrome (PRES) TTP--purple spots on the skin or inside the mouth, pale skin, yellowing skin or eyes, unusual weakness or fatigue, fever, fast or irregular heartbeat, confusion, change in vision, trouble speaking, trouble walking Tumor lysis syndrome (TLS)--nausea, vomiting, diarrhea, decrease in the amount of urine, dark urine, unusual weakness or fatigue, confusion, muscle pain or cramps, fast or irregular heartbeat, joint pain Side effects that usually do not require medical attention (report to your care team if they continue or are bothersome): Diarrhea Fatigue Nausea Trouble sleeping This list may not describe all possible side effects. Call your doctor for medical advice about side effects. You may report side effects to FDA at 1-800-FDA-1088. Where should I keep my medication? This medication is given in a hospital or clinic. It will not be stored at home. NOTE: This sheet is a summary. It may not cover all possible information. If you have questions about this medicine, talk to your doctor, pharmacist, or health care provider.  2024 Elsevier/Gold Standard (2021-08-04 00:00:00)   

## 2022-09-05 NOTE — Progress Notes (Addendum)
Patient seen by Lonna Cobb NP today  Vitals are not all within treatment parameters. B/P 114/92 no intervention   Labs reviewed by Lonna Cobb NP and are within treatment parameters.  Per physician team, patient is ready for treatment and there are NO modifications to the treatment plan.

## 2022-09-07 ENCOUNTER — Other Ambulatory Visit: Payer: Self-pay

## 2022-09-08 ENCOUNTER — Telehealth: Payer: Self-pay

## 2022-09-08 NOTE — Telephone Encounter (Signed)
The patient has indicated that they will contact [facility] to inform the provider there, and will update [place] on the next steps recommended by the provider.

## 2022-09-08 NOTE — Telephone Encounter (Signed)
-----   Message from Rana Snare, NP sent at 09/07/2022  5:14 PM EDT ----- Please let him know that the calcium level was mildly elevated on recent labs.  Recommend repeat CMET in 1 to 2 weeks.  Please schedule if he agrees.

## 2022-09-22 ENCOUNTER — Other Ambulatory Visit: Payer: Self-pay | Admitting: Nurse Practitioner

## 2022-09-22 ENCOUNTER — Other Ambulatory Visit: Payer: Self-pay | Admitting: Oncology

## 2022-09-22 ENCOUNTER — Telehealth: Payer: Self-pay

## 2022-09-22 ENCOUNTER — Encounter: Payer: Self-pay | Admitting: Oncology

## 2022-09-22 DIAGNOSIS — C9000 Multiple myeloma not having achieved remission: Secondary | ICD-10-CM

## 2022-09-22 MED ORDER — OXYCODONE-ACETAMINOPHEN 5-325 MG PO TABS
1.0000 | ORAL_TABLET | Freq: Four times a day (QID) | ORAL | 0 refills | Status: DC | PRN
Start: 2022-09-22 — End: 2022-11-21

## 2022-09-22 NOTE — Telephone Encounter (Signed)
Spoke with pt regarding his prescription refill requests and reminded pt about the 48-hour turn around time for all medication requests. Pt verbalizes understanding

## 2022-10-19 ENCOUNTER — Telehealth: Payer: Self-pay | Admitting: *Deleted

## 2022-10-19 NOTE — Telephone Encounter (Signed)
Called from [facility] Transplant navigator. Jordan King is being d/c tomorrow and will need lab/OV between 9/12 and 9/22. She will fax orders. Scheduling message sent.

## 2022-10-21 ENCOUNTER — Other Ambulatory Visit: Payer: Self-pay

## 2022-10-26 ENCOUNTER — Other Ambulatory Visit: Payer: Self-pay | Admitting: *Deleted

## 2022-10-26 DIAGNOSIS — C9000 Multiple myeloma not having achieved remission: Secondary | ICD-10-CM

## 2022-10-26 NOTE — Progress Notes (Signed)
Placed orders for labs requested by Dr. Anne Hahn at [facility] for 11/30/2022

## 2022-11-07 ENCOUNTER — Ambulatory Visit: Payer: BLUE CROSS/BLUE SHIELD | Admitting: Oncology

## 2022-11-07 ENCOUNTER — Other Ambulatory Visit: Payer: BLUE CROSS/BLUE SHIELD

## 2022-11-21 ENCOUNTER — Other Ambulatory Visit: Payer: Self-pay | Admitting: Oncology

## 2022-11-21 ENCOUNTER — Other Ambulatory Visit: Payer: Self-pay | Admitting: Nurse Practitioner

## 2022-11-21 DIAGNOSIS — C9000 Multiple myeloma not having achieved remission: Secondary | ICD-10-CM

## 2022-11-21 MED ORDER — OXYCODONE-ACETAMINOPHEN 5-325 MG PO TABS: 1.0000 | ORAL_TABLET | Freq: Four times a day (QID) | ORAL | 0 refills | Status: DC | PRN

## 2022-11-21 MED ORDER — LORAZEPAM 0.5 MG PO TABS: ORAL_TABLET | ORAL | 0 refills | Status: DC

## 2022-11-23 ENCOUNTER — Encounter: Payer: Self-pay | Admitting: Oncology

## 2022-11-30 ENCOUNTER — Inpatient Hospital Stay: Payer: Self-pay | Attending: Oncology

## 2022-11-30 ENCOUNTER — Inpatient Hospital Stay (HOSPITAL_BASED_OUTPATIENT_CLINIC_OR_DEPARTMENT_OTHER): Payer: BLUE CROSS/BLUE SHIELD | Admitting: Oncology

## 2022-11-30 VITALS — BP 109/87 | HR 99 | Temp 98.2°F | Resp 18 | Ht 68.0 in | Wt 161.7 lb

## 2022-11-30 DIAGNOSIS — Z7969 Long term (current) use of other immunomodulators and immunosuppressants: Secondary | ICD-10-CM | POA: Diagnosis not present

## 2022-11-30 DIAGNOSIS — C9000 Multiple myeloma not having achieved remission: Secondary | ICD-10-CM | POA: Insufficient documentation

## 2022-11-30 DIAGNOSIS — D649 Anemia, unspecified: Secondary | ICD-10-CM | POA: Diagnosis not present

## 2022-11-30 LAB — CMP (CANCER CENTER ONLY)
ALT: 8 U/L (ref 0–44)
AST: 10 U/L — ABNORMAL LOW (ref 15–41)
Albumin: 4.3 g/dL (ref 3.5–5.0)
Alkaline Phosphatase: 60 U/L (ref 38–126)
Anion gap: 12 (ref 5–15)
BUN: 17 mg/dL (ref 6–20)
CO2: 25 mmol/L (ref 22–32)
Calcium: 9.8 mg/dL (ref 8.9–10.3)
Chloride: 100 mmol/L (ref 98–111)
Creatinine: 1.13 mg/dL (ref 0.61–1.24)
GFR, Estimated: >60 mL/min (ref >60)
Glucose, Bld: 99 mg/dL (ref 70–99)
Potassium: 4 mmol/L (ref 3.5–5.1)
Sodium: 137 mmol/L (ref 135–145)
Total Bilirubin: 0.3 mg/dL (ref 0.3–1.2)
Total Protein: 7.4 g/dL (ref 6.5–8.1)

## 2022-11-30 LAB — SAMPLE TO BLOOD BANK

## 2022-11-30 LAB — CBC WITH DIFFERENTIAL (CANCER CENTER ONLY)
Abs Immature Granulocytes: 0.03 10*3/uL (ref 0.00–0.07)
Basophils Absolute: 0.1 10*3/uL (ref 0.0–0.1)
Basophils Relative: 1 %
Eosinophils Absolute: 0.5 10*3/uL (ref 0.0–0.5)
Eosinophils Relative: 5 %
HCT: 42.2 % (ref 39.0–52.0)
Hemoglobin: 14.5 g/dL (ref 13.0–17.0)
Immature Granulocytes: 0 %
Lymphocytes Relative: 10 %
Lymphs Abs: 1.1 10*3/uL (ref 0.7–4.0)
MCH: 32.6 pg (ref 26.0–34.0)
MCHC: 34.4 g/dL (ref 30.0–36.0)
MCV: 94.8 fL (ref 80.0–100.0)
Monocytes Absolute: 1 10*3/uL (ref 0.1–1.0)
Monocytes Relative: 9 %
Neutro Abs: 8.3 10*3/uL — ABNORMAL HIGH (ref 1.7–7.7)
Neutrophils Relative %: 75 %
Platelet Count: 200 10*3/uL (ref 150–400)
RBC: 4.45 MIL/uL (ref 4.22–5.81)
RDW: 17.7 % — ABNORMAL HIGH (ref 11.5–15.5)
WBC Count: 11 10*3/uL — ABNORMAL HIGH (ref 4.0–10.5)
nRBC: 0 % (ref 0.0–0.2)

## 2022-11-30 LAB — MAGNESIUM: Magnesium: 1.9 mg/dL (ref 1.7–2.4)

## 2022-11-30 NOTE — Addendum Note (Signed)
Addended by: Wandalee Ferdinand on: 11/30/2022 02:38 PM   Modules accepted: Orders

## 2022-11-30 NOTE — Progress Notes (Signed)
[facility] OFFICE PROGRESS NOTE   Diagnosis: Multiple myeloma  INTERVAL HISTORY:   Jordan King underwent melphalan conditioning prior to autologous stem cell infusion on 10/06/2022.  He has returned home.  He was seen at day 31 by the transplant team.  He is scheduled undergo a restaging valuation a day +100. Jordan King complains of malaise.  He has pain in the right arm and lower back.  He reports the right arm pain developed during the transplant process and has persisted.  Objective:  Vital signs in last 24 hours:  Blood pressure 109/87, pulse 99, temperature 98.2 F (36.8 C), temperature source Oral, resp. rate 18, height 5\' 8"  (1.727 m), weight 161 lb 11.2 oz (73.3 kg), SpO2 100%.    HEENT: Mild white coat over the tongue, no buccal thrush Resp: Lungs clear bilaterally Cardio: Regular rate and rhythm GI: No hepatosplenomegaly Vascular: No leg or arm edema Musculoskeletal: No right arm tenderness.  No mass.   Lab Results:  Lab Results  Component Value Date   WBC 11.0 (H) 11/30/2022   HGB 14.5 11/30/2022   HCT 42.2 11/30/2022   MCV 94.8 11/30/2022   PLT 200 11/30/2022   NEUTROABS 8.3 (H) 11/30/2022    CMP  Lab Results  Component Value Date   NA 137 11/30/2022   K 4.0 11/30/2022   CL 100 11/30/2022   CO2 25 11/30/2022   GLUCOSE 99 11/30/2022   BUN 17 11/30/2022   CREATININE 1.13 11/30/2022   CALCIUM 9.8 11/30/2022   PROT 7.4 11/30/2022   ALBUMIN 4.3 11/30/2022   AST 10 (L) 11/30/2022   ALT 8 11/30/2022   ALKPHOS 60 11/30/2022   BILITOT 0.3 11/30/2022   GFRNONAA >60 11/30/2022     Medications: I have reviewed the patient's current medications.   Assessment/Plan: Multiple myeloma -05/22/2021-kappa free light chain 11.3, lambda free light chain 17,028 -25 g proteinuria, 20 g lambda light chains -Serum immunofixation 05/22/2021-monoclonal lambda light chains -Bone marrow biopsy 05/23/2021-hypercellular marrow involved by plasma cell myeloma,  80-90% plasma cells on the bone marrow biopsy, diminished iron stains -Cytogenetics-45 X, minus Y, t11:14 (11 q13) -Cycle 1 Velcade/Decadron 05/23/2021 (Decadron given 05/22/2021) -Cycle 1 DRVd 06/13/2021, treatment held 06/20/2021 due to a rash, Revlimid resumed 06/28/2021, daratumumab resumed 07/04/2021 Revlimid resumed 06/28/2021 Cycle 2 daratumumab/Revlimid/Decadron/Velcade 07/18/2021 (Revlimid started 07/19/2021, placed on hold 07/25/2021 secondary to a rash-last taken 07/24/2021) Treatment continued with weekly Velcade/Decadron and daratumumab Treatment changed to pomalidomide, daratumumab, Decadron 10/03/2021 Pomalidomide placed on hold 10/17/2021 due to a rash Pomalidomide resumed at a reduced dose of 2 mg daily for 21 days followed by 7-day break 10/31/2021 Pomalidomide 2 mg daily for 21 days beginning 11/28/2021 Lambda light chains increased 12/12/2021 Venetoclax/Carfilzomib/Decadron 12/26/2021, venetoclax started 01/05/2022 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 CARFILZOMIB/Decadron weekly x3 beginning 01/16/2022 Venetoclax resumed 01/19/2022 100 mg daily Carfilzomib/Decadron weekly x 3 beginning 02/13/2022 (day 15 not given) Carfilzomib /Decadron weekly x 3 beginning 03/06/2022 Carfilzomib/Decadron weekly x 3 beginning 04/04/2022 Carfilzomib/Decadron weekly x 3 beginning 05/09/2022 Carfilzomib/Decadron weekly x 3 beginning 06/07/2022 Last carfilzomib 06/20/2022 PET 06/29/2022-no evidence of active myeloma healing right anterior fourth rib fracture Bone marrow biopsy 07/06/2022-mildly hypocellular bone marrow (20%), no increase in plasma cells, MRD negative Normal kappa and lambda light chains 08/03/2022 Carfilzomib 08/22/2022 10/05/2022-melphalan 10/06/2022-stem cell infusion   Renal failure secondary to #1-improved Hypercalcemia-status post treat with calcitonin, pamidronate 05/22/2021-resolved Lytic bone lesions Anemia Thrombocytopenia Hypocalcemia-IV calcium gluconate  05/30/2021, resolved Rash 06/20/2021,  consistent with  a drug rash, recurrent rash 07/24/2021-Revlimid placed on hold; recurrent rash 10/17/2021 Admission 01/09/2022 with renal failure, elevated uric acid, rasburicase 01/09/2022 Wheezing/cough-likely secondary to COPD and asthma      Disposition: [name] is now at day 43 following autologous stem cell therapy given for treatment of multiple myeloma.  The CBC and chemistry panel are unremarkable.  I recommended he resume Zometa.  He declines to resume Zometa until after the day 100 restaging evaluation.  He is scheduled for follow-up at [facility] for a restaging evaluation 01/16/2023.  He will return for an office visit and Zometa 01/30/2023.  We will refer him for imaging if the right arm pain persists.  We will follow-up on the myeloma panel from today.   Thornton Papas, MD  11/30/2022  11:27 AM

## 2022-12-01 ENCOUNTER — Other Ambulatory Visit: Payer: Self-pay

## 2022-12-02 LAB — BETA 2 MICROGLOBULIN, SERUM: Beta-2 Microglobulin: 2.3 mg/L (ref 0.6–2.4)

## 2022-12-04 LAB — KAPPA/LAMBDA LIGHT CHAINS
Kappa free light chain: 10.9 mg/L (ref 3.3–19.4)
Kappa, lambda light chain ratio: 1.31 (ref 0.26–1.65)
Lambda free light chains: 8.3 mg/L (ref 5.7–26.3)

## 2022-12-05 LAB — PROTEIN ELECTROPHORESIS, SERUM
A/G Ratio: 1 (ref 0.7–1.7)
Albumin ELP: 3.5 g/dL (ref 2.9–4.4)
Alpha-1-Globulin: 0.3 g/dL (ref 0.0–0.4)
Alpha-2-Globulin: 1.2 g/dL — ABNORMAL HIGH (ref 0.4–1.0)
Beta Globulin: 1.2 g/dL (ref 0.7–1.3)
Gamma Globulin: 0.6 g/dL (ref 0.4–1.8)
Globulin, Total: 3.4 g/dL (ref 2.2–3.9)
Total Protein ELP: 6.9 g/dL (ref 6.0–8.5)

## 2022-12-05 LAB — IMMUNOFIXATION ELECTROPHORESIS
IgA: 99 mg/dL (ref 90–386)
IgG (Immunoglobin G), Serum: 685 mg/dL (ref 603–1613)
IgM (Immunoglobulin M), Srm: 11 mg/dL — ABNORMAL LOW (ref 20–172)
Total Protein ELP: 6.6 g/dL (ref 6.0–8.5)

## 2022-12-15 ENCOUNTER — Other Ambulatory Visit: Payer: Self-pay

## 2022-12-25 ENCOUNTER — Other Ambulatory Visit: Payer: Self-pay | Admitting: Nurse Practitioner

## 2022-12-25 DIAGNOSIS — C9000 Multiple myeloma not having achieved remission: Secondary | ICD-10-CM

## 2022-12-25 MED ORDER — LORAZEPAM 0.5 MG PO TABS: ORAL_TABLET | ORAL | 0 refills | Status: DC

## 2022-12-25 MED ORDER — OXYCODONE-ACETAMINOPHEN 5-325 MG PO TABS: 1.0000 | ORAL_TABLET | Freq: Four times a day (QID) | ORAL | 0 refills | Status: DC | PRN

## 2022-12-25 NOTE — Telephone Encounter (Signed)
Forwarded to NP.

## 2023-01-04 ENCOUNTER — Encounter: Payer: Self-pay | Admitting: Oncology

## 2023-01-22 ENCOUNTER — Other Ambulatory Visit: Payer: Self-pay | Admitting: Nurse Practitioner

## 2023-01-22 DIAGNOSIS — C9000 Multiple myeloma not having achieved remission: Secondary | ICD-10-CM

## 2023-01-22 NOTE — Telephone Encounter (Signed)
Forwarded to provider.

## 2023-01-22 NOTE — Telephone Encounter (Signed)
Forwarded to NP to fill

## 2023-01-23 ENCOUNTER — Other Ambulatory Visit: Payer: Self-pay | Admitting: Nurse Practitioner

## 2023-01-23 DIAGNOSIS — C9000 Multiple myeloma not having achieved remission: Secondary | ICD-10-CM

## 2023-01-23 MED ORDER — OXYCODONE-ACETAMINOPHEN 5-325 MG PO TABS: 1.0000 | ORAL_TABLET | Freq: Four times a day (QID) | ORAL | 0 refills | Status: DC | PRN

## 2023-01-23 MED ORDER — LORAZEPAM 0.5 MG PO TABS: ORAL_TABLET | ORAL | 0 refills | Status: DC

## 2023-01-29 ENCOUNTER — Other Ambulatory Visit: Payer: Self-pay | Admitting: *Deleted

## 2023-01-29 DIAGNOSIS — C9000 Multiple myeloma not having achieved remission: Secondary | ICD-10-CM

## 2023-01-30 ENCOUNTER — Inpatient Hospital Stay (HOSPITAL_BASED_OUTPATIENT_CLINIC_OR_DEPARTMENT_OTHER): Payer: BLUE CROSS/BLUE SHIELD | Admitting: Oncology

## 2023-01-30 ENCOUNTER — Inpatient Hospital Stay: Payer: BLUE CROSS/BLUE SHIELD | Attending: Oncology

## 2023-01-30 ENCOUNTER — Encounter: Payer: Self-pay | Admitting: *Deleted

## 2023-01-30 ENCOUNTER — Inpatient Hospital Stay: Payer: BLUE CROSS/BLUE SHIELD

## 2023-01-30 VITALS — BP 122/86 | HR 84 | Resp 15

## 2023-01-30 VITALS — BP 113/85 | HR 70 | Temp 97.9°F | Resp 18 | Ht 68.0 in | Wt 164.3 lb

## 2023-01-30 DIAGNOSIS — C9000 Multiple myeloma not having achieved remission: Secondary | ICD-10-CM

## 2023-01-30 LAB — CMP (CANCER CENTER ONLY)
ALT: 7 U/L (ref 0–44)
AST: 10 U/L — ABNORMAL LOW (ref 15–41)
Albumin: 4.2 g/dL (ref 3.5–5.0)
Alkaline Phosphatase: 77 U/L (ref 38–126)
Anion gap: 10 (ref 5–15)
BUN: 14 mg/dL (ref 6–20)
CO2: 23 mmol/L (ref 22–32)
Calcium: 10 mg/dL (ref 8.9–10.3)
Chloride: 103 mmol/L (ref 98–111)
Creatinine: 0.97 mg/dL (ref 0.61–1.24)
GFR, Estimated: >60 mL/min (ref >60)
Glucose, Bld: 115 mg/dL — ABNORMAL HIGH (ref 70–99)
Potassium: 3.7 mmol/L (ref 3.5–5.1)
Sodium: 136 mmol/L (ref 135–145)
Total Bilirubin: 0.3 mg/dL (ref <1.2)
Total Protein: 7.2 g/dL (ref 6.5–8.1)

## 2023-01-30 LAB — CBC WITH DIFFERENTIAL (CANCER CENTER ONLY)
Abs Immature Granulocytes: 0.03 10*3/uL (ref 0.00–0.07)
Basophils Absolute: 0.1 10*3/uL (ref 0.0–0.1)
Basophils Relative: 1 %
Eosinophils Absolute: 0.4 10*3/uL (ref 0.0–0.5)
Eosinophils Relative: 4 %
HCT: 43.9 % (ref 39.0–52.0)
Hemoglobin: 15.4 g/dL (ref 13.0–17.0)
Immature Granulocytes: 0 %
Lymphocytes Relative: 12 %
Lymphs Abs: 1.2 10*3/uL (ref 0.7–4.0)
MCH: 33.6 pg (ref 26.0–34.0)
MCHC: 35.1 g/dL (ref 30.0–36.0)
MCV: 95.6 fL (ref 80.0–100.0)
Monocytes Absolute: 0.8 10*3/uL (ref 0.1–1.0)
Monocytes Relative: 8 %
Neutro Abs: 7.6 10*3/uL (ref 1.7–7.7)
Neutrophils Relative %: 75 %
Platelet Count: 207 10*3/uL (ref 150–400)
RBC: 4.59 MIL/uL (ref 4.22–5.81)
RDW: 14.4 % (ref 11.5–15.5)
WBC Count: 10 10*3/uL (ref 4.0–10.5)
nRBC: 0 % (ref 0.0–0.2)

## 2023-01-30 MED ORDER — ZOLEDRONIC ACID 4 MG/100ML IV SOLN
4.0000 mg | Freq: Once | INTRAVENOUS | Status: AC
  Administered 2023-01-30: 4 mg via INTRAVENOUS
  Filled 2023-01-30: qty 100

## 2023-01-30 MED ORDER — SODIUM CHLORIDE 0.9 % IV SOLN: Freq: Once | INTRAVENOUS | Status: AC

## 2023-01-30 NOTE — Patient Instructions (Signed)

## 2023-01-30 NOTE — Progress Notes (Signed)
[facility] OFFICE PROGRESS NOTE   Diagnosis: Multiple myeloma  INTERVAL HISTORY:   Mr Caughell returns as scheduled.  He reports malaise.  He has chronic back pain.  He underwent the day 100 restaging evaluation at [facility] last week.  He is in clinical remission from myeloma.  Dr. Anne Hahn will contact him to recommend maintenance therapy.  He is here for Zometa today.  He denies tooth or jaw pain.  Objective:  Vital signs in last 24 hours:  Blood pressure 113/85, pulse 70, temperature 97.9 F (36.6 C), temperature source Temporal, resp. rate 18, height 5\' 8"  (1.727 m), weight 164 lb 4.8 oz (74.5 kg), SpO2 96%.    Resp: Lungs clear bilaterally Cardio: Regular rate and rhythm GI: No hepatosplenomegaly Vascular: No leg edema   Lab Results:  Lab Results  Component Value Date   WBC 10.0 01/30/2023   HGB 15.4 01/30/2023   HCT 43.9 01/30/2023   MCV 95.6 01/30/2023   PLT 207 01/30/2023   NEUTROABS 7.6 01/30/2023    CMP  Lab Results  Component Value Date   NA 136 01/30/2023   K 3.7 01/30/2023   CL 103 01/30/2023   CO2 23 01/30/2023   GLUCOSE 115 (H) 01/30/2023   BUN 14 01/30/2023   CREATININE 0.97 01/30/2023   CALCIUM 10.0 01/30/2023   PROT 7.2 01/30/2023   ALBUMIN 4.2 01/30/2023   AST 10 (L) 01/30/2023   ALT 7 01/30/2023   ALKPHOS 77 01/30/2023   BILITOT 0.3 01/30/2023   GFRNONAA >60 01/30/2023      Medications: I have reviewed the patient's current medications.   Assessment/Plan: Multiple myeloma -05/22/2021-kappa free light chain 11.3, lambda free light chain 17,028 -25 g proteinuria, 20 g lambda light chains -Serum immunofixation 05/22/2021-monoclonal lambda light chains -Bone marrow biopsy 05/23/2021-hypercellular marrow involved by plasma cell myeloma, 80-90% plasma cells on the bone marrow biopsy, diminished iron stains -Cytogenetics-45 X, minus Y, t11:14 (11 q13) -Cycle 1 Velcade/Decadron 05/23/2021 (Decadron given 05/22/2021) -Cycle 1 DRVd  06/13/2021, treatment held 06/20/2021 due to a rash, Revlimid resumed 06/28/2021, daratumumab resumed 07/04/2021 Revlimid resumed 06/28/2021 Cycle 2 daratumumab/Revlimid/Decadron/Velcade 07/18/2021 (Revlimid started 07/19/2021, placed on hold 07/25/2021 secondary to a rash-last taken 07/24/2021) Treatment continued with weekly Velcade/Decadron and daratumumab Treatment changed to pomalidomide, daratumumab, Decadron 10/03/2021 Pomalidomide placed on hold 10/17/2021 due to a rash Pomalidomide resumed at a reduced dose of 2 mg daily for 21 days followed by 7-day break 10/31/2021 Pomalidomide 2 mg daily for 21 days beginning 11/28/2021 Lambda light chains increased 12/12/2021 Venetoclax/Carfilzomib/Decadron 12/26/2021, venetoclax started 01/05/2022 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 CARFILZOMIB/Decadron weekly x3 beginning 01/16/2022 Venetoclax resumed 01/19/2022 100 mg daily Carfilzomib/Decadron weekly x 3 beginning 02/13/2022 (day 15 not given) Carfilzomib /Decadron weekly x 3 beginning 03/06/2022 Carfilzomib/Decadron weekly x 3 beginning 04/04/2022 Carfilzomib/Decadron weekly x 3 beginning 05/09/2022 Carfilzomib/Decadron weekly x 3 beginning 06/07/2022 Last carfilzomib 06/20/2022 PET 06/29/2022-no evidence of active myeloma healing right anterior fourth rib fracture Bone marrow biopsy 07/06/2022-mildly hypocellular bone marrow (20%), no increase in plasma cells, MRD negative Normal kappa and lambda light chains 08/03/2022 Carfilzomib 08/22/2022 10/05/2022-melphalan 10/06/2022-stem cell infusion Bone marrow biopsy 01/16/2023-mildly hypocellular marrow, no increase in plasma cells, MRD -46 XY 01/16/2023-serum IFE-polyclonal, normal free light chains 01/16/2023: PET-no definite FDG avid lesions, uptake at L3 compression deformity or focal muscular uptake in the right calf-physiologic?   Renal failure secondary to #1-improved Hypercalcemia-status post treat with calcitonin, pamidronate  05/22/2021-resolved Lytic bone lesions Anemia Thrombocytopenia Hypocalcemia-IV calcium  gluconate 05/30/2021, resolved Rash 06/20/2021,  consistent with a drug rash, recurrent rash 07/24/2021-Revlimid placed on hold; recurrent rash 10/17/2021 Admission 01/09/2022 with renal failure, elevated uric acid, rasburicase 01/09/2022 Wheezing/cough-likely secondary to COPD and asthma        Disposition: Mr. Halfacre is in clinical remission from multiple myeloma.  He is now greater than 100 days out from the stem cell procedure.  A restaging evaluation at [facility] last week revealed no evidence of residual disease.  Today was we will continue every 3 months Zometa.  We will contact Dr. Anne Hahn for recommendations regarding maintenance therapy. Mr. Pattillo will return for an office and lab visit in 1 month.  Thornton Papas, MD  01/30/2023  1:39 PM

## 2023-01-30 NOTE — Progress Notes (Signed)
Left message with triage nurse w/Dr. Anne Hahn at [facility] to inquire if maintenance therapy is recommended and how long to continue his Bactrim DS and antiviral.

## 2023-01-30 NOTE — Progress Notes (Deleted)
  [facility] OFFICE PROGRESS NOTE   Diagnosis:   INTERVAL HISTORY:   ***  Objective:  Vital signs in last 24 hours:  Blood pressure 113/85, pulse 70, temperature 97.9 F (36.6 C), temperature source Temporal, resp. rate 18, height 5\' 8"  (1.727 m), weight 164 lb 4.8 oz (74.5 kg), SpO2 96%.    HEENT: *** Lymphatics: *** Resp: *** Cardio: *** GI: *** Vascular: *** Neuro:***  Skin:***   Portacath/PICC-without erythema  Lab Results:  Lab Results  Component Value Date   WBC 10.0 01/30/2023   HGB 15.4 01/30/2023   HCT 43.9 01/30/2023   MCV 95.6 01/30/2023   PLT 207 01/30/2023   NEUTROABS 7.6 01/30/2023    CMP  Lab Results  Component Value Date   NA 136 01/30/2023   K 3.7 01/30/2023   CL 103 01/30/2023   CO2 23 01/30/2023   GLUCOSE 115 (H) 01/30/2023   BUN 14 01/30/2023   CREATININE 0.97 01/30/2023   CALCIUM 10.0 01/30/2023   PROT 7.2 01/30/2023   ALBUMIN 4.2 01/30/2023   AST 10 (L) 01/30/2023   ALT 7 01/30/2023   ALKPHOS 77 01/30/2023   BILITOT 0.3 01/30/2023   GFRNONAA >60 01/30/2023    No results found for: "CEA1", "CEA", "CAN199", "CA125"  Lab Results  Component Value Date   INR 1.1 05/22/2021   LABPROT 14.4 05/22/2021    Imaging:  No results found.  Medications: I have reviewed the patient's current medications.   Assessment/Plan: Multiple myeloma -05/22/2021-kappa free light chain 11.3, lambda free light chain 17,028 -25 g proteinuria, 20 g lambda light chains -Serum immunofixation 05/22/2021-monoclonal lambda light chains -Bone marrow biopsy 05/23/2021-hypercellular marrow involved by plasma cell myeloma, 80-90% plasma cells on the bone marrow biopsy, diminished iron stains -Cytogenetics-45 X, minus Y, t11:14 (11 q13) -Cycle 1 Velcade/Decadron 05/23/2021 (Decadron given 05/22/2021) -Cycle 1 DRVd 06/13/2021, treatment held 06/20/2021 due to a rash, Revlimid resumed 06/28/2021, daratumumab resumed 07/04/2021 Revlimid resumed  06/28/2021 Cycle 2 daratumumab/Revlimid/Decadron/Velcade 07/18/2021 (Revlimid started 07/19/2021, placed on hold 07/25/2021 secondary to a rash-last taken 07/24/2021) Treatment continued with weekly Velcade/Decadron and daratumumab Treatment changed to pomalidomide, daratumumab, Decadron 10/03/2021 Pomalidomide placed on hold 10/17/2021 due to a rash Pomalidomide resumed at a reduced dose of 2 mg daily for 21 days followed by 7-day break 10/31/2021 Pomalidomide 2 mg daily for 21 days beginning 11/28/2021 Lambda light chains increased 12/12/2021 Venetoclax/Carfilzomib/Decadron 12/26/2021, venetoclax started 01/05/2022 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 CARFILZOMIB/Decadron weekly x3 beginning 01/16/2022 Venetoclax resumed 01/19/2022 100 mg daily Carfilzomib/Decadron weekly x 3 beginning 02/13/2022 (day 15 not given) Carfilzomib /Decadron weekly x 3 beginning 03/06/2022 Carfilzomib/Decadron weekly x 3 beginning 04/04/2022 Carfilzomib/Decadron weekly x 3 beginning 05/09/2022 Carfilzomib/Decadron weekly x 3 beginning 06/07/2022 Last carfilzomib 06/20/2022 PET 06/29/2022-no evidence of active myeloma healing right anterior fourth rib fracture Bone marrow biopsy 07/06/2022-mildly hypocellular bone marrow (20%), no increase in plasma cells, MRD negative Normal kappa and lambda light chains 08/03/2022 Carfilzomib 08/22/2022 10/05/2022-melphalan 10/06/2022-stem cell infusion   Renal failure secondary to #1-improved Hypercalcemia-status post treat with calcitonin, pamidronate 05/22/2021-resolved Lytic bone lesions Anemia Thrombocytopenia Hypocalcemia-IV calcium gluconate 05/30/2021, resolved Rash 06/20/2021,  consistent with a drug rash, recurrent rash 07/24/2021-Revlimid placed on hold; recurrent rash 10/17/2021 Admission 01/09/2022 with renal failure, elevated uric acid, rasburicase 01/09/2022 Wheezing/cough-likely secondary to COPD and asthma       Disposition: ***  Thornton Papas,  MD  01/30/2023  11:15 AM

## 2023-02-01 ENCOUNTER — Other Ambulatory Visit: Payer: Self-pay

## 2023-02-08 ENCOUNTER — Encounter: Payer: Self-pay | Admitting: *Deleted

## 2023-02-08 NOTE — Progress Notes (Signed)
Left another message with [facility] onc[facility] to request Dr. Garry Heater recommendations for maintenance therapy for patient. Please have MD call or email Dr. Truett Perna with his suggestions.

## 2023-02-24 ENCOUNTER — Other Ambulatory Visit: Payer: Self-pay | Admitting: Nurse Practitioner

## 2023-02-24 DIAGNOSIS — C9000 Multiple myeloma not having achieved remission: Secondary | ICD-10-CM

## 2023-02-27 ENCOUNTER — Other Ambulatory Visit: Payer: Self-pay | Admitting: Nurse Practitioner

## 2023-02-27 DIAGNOSIS — C9000 Multiple myeloma not having achieved remission: Secondary | ICD-10-CM

## 2023-02-27 MED ORDER — OXYCODONE-ACETAMINOPHEN 5-325 MG PO TABS: 1.0000 | ORAL_TABLET | Freq: Four times a day (QID) | ORAL | 0 refills | Status: DC | PRN

## 2023-02-27 MED ORDER — LORAZEPAM 0.5 MG PO TABS: ORAL_TABLET | ORAL | 0 refills | Status: DC

## 2023-03-08 ENCOUNTER — Other Ambulatory Visit: Payer: Self-pay | Admitting: *Deleted

## 2023-03-08 ENCOUNTER — Other Ambulatory Visit: Payer: BLUE CROSS/BLUE SHIELD

## 2023-03-08 ENCOUNTER — Inpatient Hospital Stay: Payer: BLUE CROSS/BLUE SHIELD | Attending: Oncology | Admitting: Oncology

## 2023-03-08 ENCOUNTER — Inpatient Hospital Stay: Payer: BLUE CROSS/BLUE SHIELD

## 2023-03-08 VITALS — BP 106/89 | HR 96 | Temp 98.1°F | Resp 18 | Ht 68.0 in | Wt 166.4 lb

## 2023-03-08 DIAGNOSIS — D649 Anemia, unspecified: Secondary | ICD-10-CM | POA: Insufficient documentation

## 2023-03-08 DIAGNOSIS — C9000 Multiple myeloma not having achieved remission: Secondary | ICD-10-CM | POA: Insufficient documentation

## 2023-03-08 DIAGNOSIS — Z7969 Long term (current) use of other immunomodulators and immunosuppressants: Secondary | ICD-10-CM | POA: Insufficient documentation

## 2023-03-08 LAB — CBC WITH DIFFERENTIAL (CANCER CENTER ONLY)
Abs Immature Granulocytes: 0.03 10*3/uL (ref 0.00–0.07)
Basophils Absolute: 0.1 10*3/uL (ref 0.0–0.1)
Basophils Relative: 1 %
Eosinophils Absolute: 0.4 10*3/uL (ref 0.0–0.5)
Eosinophils Relative: 4 %
HCT: 47.1 % (ref 39.0–52.0)
Hemoglobin: 16.4 g/dL (ref 13.0–17.0)
Immature Granulocytes: 0 %
Lymphocytes Relative: 15 %
Lymphs Abs: 1.6 10*3/uL (ref 0.7–4.0)
MCH: 33.1 pg (ref 26.0–34.0)
MCHC: 34.8 g/dL (ref 30.0–36.0)
MCV: 95.2 fL (ref 80.0–100.0)
Monocytes Absolute: 0.9 10*3/uL (ref 0.1–1.0)
Monocytes Relative: 8 %
Neutro Abs: 7.5 10*3/uL (ref 1.7–7.7)
Neutrophils Relative %: 72 %
Platelet Count: 194 10*3/uL (ref 150–400)
RBC: 4.95 MIL/uL (ref 4.22–5.81)
RDW: 14.4 % (ref 11.5–15.5)
WBC Count: 10.4 10*3/uL (ref 4.0–10.5)
nRBC: 0 % (ref 0.0–0.2)

## 2023-03-08 LAB — CMP (CANCER CENTER ONLY)
ALT: 10 U/L (ref 0–44)
AST: 10 U/L — ABNORMAL LOW (ref 15–41)
Albumin: 4.3 g/dL (ref 3.5–5.0)
Alkaline Phosphatase: 69 U/L (ref 38–126)
Anion gap: 10 (ref 5–15)
BUN: 13 mg/dL (ref 6–20)
CO2: 23 mmol/L (ref 22–32)
Calcium: 9.7 mg/dL (ref 8.9–10.3)
Chloride: 103 mmol/L (ref 98–111)
Creatinine: 0.99 mg/dL (ref 0.61–1.24)
GFR, Estimated: >60 mL/min (ref >60)
Glucose, Bld: 117 mg/dL — ABNORMAL HIGH (ref 70–99)
Potassium: 3.8 mmol/L (ref 3.5–5.1)
Sodium: 136 mmol/L (ref 135–145)
Total Bilirubin: 0.3 mg/dL (ref <1.2)
Total Protein: 6.9 g/dL (ref 6.5–8.1)

## 2023-03-08 MED ORDER — PANTOPRAZOLE SODIUM 20 MG PO TBEC: 20.0000 mg | DELAYED_RELEASE_TABLET | Freq: Every day | ORAL | 2 refills | Status: DC

## 2023-03-08 MED ORDER — PROCHLORPERAZINE MALEATE 10 MG PO TABS: ORAL_TABLET | ORAL | 1 refills | Status: DC

## 2023-03-08 MED ORDER — VENETOCLAX 100 MG PO TABS: 100.0000 mg | ORAL_TABLET | Freq: Every day | ORAL | 1 refills | Status: DC

## 2023-03-08 MED ORDER — VALACYCLOVIR HCL 500 MG PO TABS: 500.0000 mg | ORAL_TABLET | Freq: Every day | ORAL | 5 refills | Status: DC

## 2023-03-08 MED ORDER — OXYCODONE-ACETAMINOPHEN 5-325 MG PO TABS: 1.0000 | ORAL_TABLET | Freq: Four times a day (QID) | ORAL | 0 refills | Status: DC | PRN

## 2023-03-08 MED ORDER — LORAZEPAM 0.5 MG PO TABS: ORAL_TABLET | ORAL | 0 refills | Status: DC

## 2023-03-08 NOTE — Progress Notes (Deleted)
[facility] OFFICE PROGRESS NOTE   Diagnosis:   INTERVAL HISTORY:   ***  Objective:  Vital signs in last 24 hours:  Blood pressure 106/89, pulse 96, temperature 98.1 F (36.7 C), temperature source Temporal, resp. rate 18, height 5\' 8"  (1.727 m), weight 166 lb 6.4 oz (75.5 kg), SpO2 98%.    HEENT: *** Lymphatics: *** Resp: *** Cardio: *** GI: *** Vascular: *** Neuro:***  Skin:***   Portacath/PICC-without erythema  Lab Results:  Lab Results  Component Value Date   WBC 10.4 03/08/2023   HGB 16.4 03/08/2023   HCT 47.1 03/08/2023   MCV 95.2 03/08/2023   PLT 194 03/08/2023   NEUTROABS 7.5 03/08/2023    CMP  Lab Results  Component Value Date   NA 136 01/30/2023   K 3.7 01/30/2023   CL 103 01/30/2023   CO2 23 01/30/2023   GLUCOSE 115 (H) 01/30/2023   BUN 14 01/30/2023   CREATININE 0.97 01/30/2023   CALCIUM 10.0 01/30/2023   PROT 7.2 01/30/2023   ALBUMIN 4.2 01/30/2023   AST 10 (L) 01/30/2023   ALT 7 01/30/2023   ALKPHOS 77 01/30/2023   BILITOT 0.3 01/30/2023   GFRNONAA >60 01/30/2023    No results found for: "CEA1", "CEA", "CAN199", "CA125"  Lab Results  Component Value Date   INR 1.1 05/22/2021   LABPROT 14.4 05/22/2021    Imaging:  No results found.  Medications: I have reviewed the patient's current medications.   Assessment/Plan: Multiple myeloma -05/22/2021-kappa free light chain 11.3, lambda free light chain 17,028 -25 g proteinuria, 20 g lambda light chains -Serum immunofixation 05/22/2021-monoclonal lambda light chains -Bone marrow biopsy 05/23/2021-hypercellular marrow involved by plasma cell myeloma, 80-90% plasma cells on the bone marrow biopsy, diminished iron stains -Cytogenetics-45 X, minus Y, t11:14 (11 q13) -Cycle 1 Velcade/Decadron 05/23/2021 (Decadron given 05/22/2021) -Cycle 1 DRVd 06/13/2021, treatment held 06/20/2021 due to a rash, Revlimid resumed 06/28/2021, daratumumab resumed 07/04/2021 Revlimid resumed  06/28/2021 Cycle 2 daratumumab/Revlimid/Decadron/Velcade 07/18/2021 (Revlimid started 07/19/2021, placed on hold 07/25/2021 secondary to a rash-last taken 07/24/2021) Treatment continued with weekly Velcade/Decadron and daratumumab Treatment changed to pomalidomide, daratumumab, Decadron 10/03/2021 Pomalidomide placed on hold 10/17/2021 due to a rash Pomalidomide resumed at a reduced dose of 2 mg daily for 21 days followed by 7-day break 10/31/2021 Pomalidomide 2 mg daily for 21 days beginning 11/28/2021 Lambda light chains increased 12/12/2021 Venetoclax/Carfilzomib/Decadron 12/26/2021, venetoclax started 01/05/2022 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 CARFILZOMIB/Decadron weekly x3 beginning 01/16/2022 Venetoclax resumed 01/19/2022 100 mg daily Carfilzomib/Decadron weekly x 3 beginning 02/13/2022 (day 15 not given) Carfilzomib /Decadron weekly x 3 beginning 03/06/2022 Carfilzomib/Decadron weekly x 3 beginning 04/04/2022 Carfilzomib/Decadron weekly x 3 beginning 05/09/2022 Carfilzomib/Decadron weekly x 3 beginning 06/07/2022 Last carfilzomib 06/20/2022 PET 06/29/2022-no evidence of active myeloma healing right anterior fourth rib fracture Bone marrow biopsy 07/06/2022-mildly hypocellular bone marrow (20%), no increase in plasma cells, MRD negative Normal kappa and lambda light chains 08/03/2022 Carfilzomib 08/22/2022 10/05/2022-melphalan 10/06/2022-stem cell infusion Bone marrow biopsy 01/16/2023-mildly hypocellular marrow, no increase in plasma cells, MRD -46 XY 01/16/2023-serum IFE-polyclonal, normal free light chains 01/16/2023: PET-no definite FDG avid lesions, uptake at L3 compression deformity or focal muscular uptake in the right calf-physiologic?   Renal failure secondary to #1-improved Hypercalcemia-status post treat with calcitonin, pamidronate 05/22/2021-resolved Lytic bone lesions Anemia Thrombocytopenia Hypocalcemia-IV calcium gluconate 05/30/2021, resolved Rash  06/20/2021,  consistent with a drug rash, recurrent rash 07/24/2021-Revlimid placed on hold; recurrent rash 10/17/2021 Admission 01/09/2022 with renal failure, elevated  uric acid, rasburicase 01/09/2022 Wheezing/cough-likely secondary to COPD and asthma        Disposition: ***  Thornton Papas, MD  03/08/2023  10:08 AM

## 2023-03-08 NOTE — Progress Notes (Signed)
[facility] OFFICE PROGRESS NOTE   Diagnosis: Multiple myeloma  INTERVAL HISTORY:   Jordan King returns as scheduled.  He complains of malaise.  He has chronic back pain.  He has discomfort at the left upper posterior tooth.  He has not seen a dentist. Dr. Anne Hahn recommends maintenance therapy with carfilzomib/venetoclax or single agent venetoclax if Jordan King declines an IV effusion.  Jordan King does not wish to receive carfilzomib.  Objective:  Vital signs in last 24 hours:  Blood pressure 106/89, pulse 96, temperature 98.1 F (36.7 C), temperature source Temporal, resp. rate 18, height 5\' 8"  (1.727 m), weight 166 lb 6.4 oz (75.5 kg), SpO2 98%.    HEENT: Poor dentition, no erythema or discharge at the left upper teeth/gumline Resp: Lungs clear bilaterally Cardio: Regular rate and rhythm GI: No hepatosplenomegaly Vascular: No leg edema   Lab Results:  Lab Results  Component Value Date   WBC 10.4 03/08/2023   HGB 16.4 03/08/2023   HCT 47.1 03/08/2023   MCV 95.2 03/08/2023   PLT 194 03/08/2023   NEUTROABS 7.5 03/08/2023    CMP  Lab Results  Component Value Date   NA 136 03/08/2023   K 3.8 03/08/2023   CL 103 03/08/2023   CO2 23 03/08/2023   GLUCOSE 117 (H) 03/08/2023   BUN 13 03/08/2023   CREATININE 0.99 03/08/2023   CALCIUM 9.7 03/08/2023   PROT 6.9 03/08/2023   ALBUMIN 4.3 03/08/2023   AST 10 (L) 03/08/2023   ALT 10 03/08/2023   ALKPHOS 69 03/08/2023   BILITOT 0.3 03/08/2023   GFRNONAA >60 03/08/2023     Medications: I have reviewed the patient's current medications.   Assessment/Plan: Multiple myeloma -05/22/2021-kappa free light chain 11.3, lambda free light chain 17,028 -25 g proteinuria, 20 g lambda light chains -Serum immunofixation 05/22/2021-monoclonal lambda light chains -Bone marrow biopsy 05/23/2021-hypercellular marrow involved by plasma cell myeloma, 80-90% plasma cells on the bone marrow biopsy, diminished iron  stains -Cytogenetics-45 X, minus Y, t11:14 (11 q13) -Cycle 1 Velcade/Decadron 05/23/2021 (Decadron given 05/22/2021) -Cycle 1 DRVd 06/13/2021, treatment held 06/20/2021 due to a rash, Revlimid resumed 06/28/2021, daratumumab resumed 07/04/2021 Revlimid resumed 06/28/2021 Cycle 2 daratumumab/Revlimid/Decadron/Velcade 07/18/2021 (Revlimid started 07/19/2021, placed on hold 07/25/2021 secondary to a rash-last taken 07/24/2021) Treatment continued with weekly Velcade/Decadron and daratumumab Treatment changed to pomalidomide, daratumumab, Decadron 10/03/2021 Pomalidomide placed on hold 10/17/2021 due to a rash Pomalidomide resumed at a reduced dose of 2 mg daily for 21 days followed by 7-day break 10/31/2021 Pomalidomide 2 mg daily for 21 days beginning 11/28/2021 Lambda light chains increased 12/12/2021 Venetoclax/Carfilzomib/Decadron 12/26/2021, venetoclax started 01/05/2022 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 CARFILZOMIB/Decadron weekly x3 beginning 01/16/2022 Venetoclax resumed 01/19/2022 100 mg daily Carfilzomib/Decadron weekly x 3 beginning 02/13/2022 (day 15 not given) Carfilzomib /Decadron weekly x 3 beginning 03/06/2022 Carfilzomib/Decadron weekly x 3 beginning 04/04/2022 Carfilzomib/Decadron weekly x 3 beginning 05/09/2022 Carfilzomib/Decadron weekly x 3 beginning 06/07/2022 Last carfilzomib 06/20/2022 PET 06/29/2022-no evidence of active myeloma healing right anterior fourth rib fracture Bone marrow biopsy 07/06/2022-mildly hypocellular bone marrow (20%), no increase in plasma cells, MRD negative Normal kappa and lambda light chains 08/03/2022 Carfilzomib 08/22/2022 10/05/2022-melphalan 10/06/2022-stem cell infusion Bone marrow biopsy 01/16/2023-mildly hypocellular marrow, no increase in plasma cells, MRD -46 XY 01/16/2023-serum IFE-polyclonal, normal free light chains 01/16/2023: PET-no definite FDG avid lesions, uptake at L3 compression deformity or focal muscular uptake in the  right calf-physiologic? 03/08/2023: Venetoclax 100 mg daily   Renal failure secondary  to #1-improved Hypercalcemia-status post treat with calcitonin, pamidronate 05/22/2021-resolved Lytic bone lesions Anemia Thrombocytopenia Hypocalcemia-IV calcium gluconate 05/30/2021, resolved Rash 06/20/2021,  consistent with a drug rash, recurrent rash 07/24/2021-Revlimid placed on hold; recurrent rash 10/17/2021 Admission 01/09/2022 with renal failure, elevated uric acid, rasburicase 01/09/2022 Wheezing/cough-likely secondary to COPD and asthma     Disposition: Jordan King appears stable.  He agrees to begin maintenance therapy with venetoclax.  We discussed potential toxicities associated with venetoclax including the chance of tumor lysis and hematologic toxicity.  He should be at low risk for tumor lysis given the recent negative staging evaluation.  The plan is to begin venetoclax on 03/12/2023.  I encouraged him to see a dentist.  Zometa will be on hold until he sees a dentist.  We refilled multiple medications today.  He will have a lab visit with his primary provider during the first week of January.  He will return for an office visit in approximately 5 weeks.  Thornton Papas, MD  03/08/2023  10:21 AM

## 2023-03-08 NOTE — Telephone Encounter (Signed)
Faxed office note and rerques for CBC/diff and BMEt to PCP, Dr. Duane Lope when he sees him on 03/27/23

## 2023-03-09 ENCOUNTER — Other Ambulatory Visit: Payer: Self-pay

## 2023-03-09 LAB — KAPPA/LAMBDA LIGHT CHAINS
Kappa free light chain: 10.8 mg/L (ref 3.3–19.4)
Kappa, lambda light chain ratio: 1.08 (ref 0.26–1.65)
Lambda free light chains: 10 mg/L (ref 5.7–26.3)

## 2023-03-13 ENCOUNTER — Telehealth: Payer: Self-pay

## 2023-03-13 ENCOUNTER — Telehealth: Payer: Self-pay | Admitting: *Deleted

## 2023-03-13 NOTE — Telephone Encounter (Signed)
Informed Jordan King that his PCP declines to draw the CBC/diff and CMP we requested, for they do not collect outside labs. He will call back with decision about coming to [facility] for lab or to [facility] in [place].

## 2023-03-13 NOTE — Telephone Encounter (Signed)
Notified Patient of completion of Disability Forms. Fax transmission confirmation received. Copy of forms emailed to patient as requested. No other needs or concerns voiced at this time.

## 2023-03-15 ENCOUNTER — Encounter: Payer: Self-pay | Admitting: Oncology

## 2023-03-20 ENCOUNTER — Encounter: Payer: Self-pay | Admitting: *Deleted

## 2023-03-20 LAB — MULTIPLE MYELOMA PANEL, SERUM
Albumin SerPl Elph-Mcnc: 3.6 g/dL (ref 2.9–4.4)
Albumin/Glob SerPl: 1.3 (ref 0.7–1.7)
Alpha 1: 0.3 g/dL (ref 0.0–0.4)
Alpha2 Glob SerPl Elph-Mcnc: 1.1 g/dL — ABNORMAL HIGH (ref 0.4–1.0)
B-Globulin SerPl Elph-Mcnc: 1.1 g/dL (ref 0.7–1.3)
Gamma Glob SerPl Elph-Mcnc: 0.4 g/dL (ref 0.4–1.8)
Globulin, Total: 3 g/dL (ref 2.2–3.9)
IgA: 78 mg/dL — ABNORMAL LOW (ref 90–386)
IgG (Immunoglobin G), Serum: 576 mg/dL — ABNORMAL LOW (ref 603–1613)
IgM (Immunoglobulin M), Srm: 19 mg/dL — ABNORMAL LOW (ref 20–172)
Total Protein ELP: 6.6 g/dL (ref 6.0–8.5)

## 2023-03-20 NOTE — Progress Notes (Unsigned)
 Faxed orders for CBC/diff and CMP to [facility] [phone] to be collected week of 03/25/23 w/results faxed to [phone]

## 2023-03-29 ENCOUNTER — Encounter: Payer: Self-pay | Admitting: Oncology

## 2023-03-31 ENCOUNTER — Other Ambulatory Visit: Payer: Self-pay

## 2023-04-02 ENCOUNTER — Encounter: Payer: Self-pay | Admitting: Oncology

## 2023-04-12 ENCOUNTER — Encounter: Payer: Self-pay | Admitting: Oncology

## 2023-04-12 ENCOUNTER — Telehealth: Payer: Self-pay

## 2023-04-12 ENCOUNTER — Other Ambulatory Visit (HOSPITAL_COMMUNITY): Payer: Self-pay

## 2023-04-12 NOTE — Telephone Encounter (Signed)
Oral Oncology Patient Advocate Encounter  Change in insurance   Received notification that prior authorization for Venclexta is required.   PA submitted on 04/12/23  Patient has coverage through both [facility] and [employer] Insurances  Key [id number]  ([place])  Key [id number] ([employer])  Status is pending     Ardeen Fillers, CPhT Oncology Pharmacy Patient Advocate  [facility]  [phone] (phone) [phone] (fax) 04/12/2023 3:27 PM

## 2023-04-13 ENCOUNTER — Other Ambulatory Visit (HOSPITAL_COMMUNITY): Payer: Self-pay

## 2023-04-13 NOTE — Telephone Encounter (Signed)
Oral Oncology Patient Advocate Encounter  Prior Authorization for Jordan King has been approved.    PA# [id number] ([employer])  Effective dates: 04/12/23 through 04/11/24  Patients co-pay is $738.06 ([employer])   PA# [id number] ([employer])  Effective dates: 04/12/23 through 04/11/24  Patients co-pay is $1,123.34 ([employer])   Patients co-pay is $0.00 billing [employer] as Primary and [employer] Secondary.    Ardeen Fillers, CPhT Oncology Pharmacy Patient Advocate  [facility]  [phone] (phone) [phone] (fax) 04/13/2023 7:59 AM

## 2023-04-16 ENCOUNTER — Encounter: Payer: Self-pay | Admitting: Oncology

## 2023-04-16 ENCOUNTER — Other Ambulatory Visit: Payer: Self-pay

## 2023-04-16 LAB — COLOGUARD: COLOGUARD: NEGATIVE

## 2023-04-16 NOTE — Telephone Encounter (Signed)
Called and left VM for patient to return my call at [phone], to go over insurance approvals and get patient set up with [facility]. I will continue to reach out to patient.   Call 1 - 04/13/23 LVM Call 2 - 04/16/23 LVM   Ardeen Fillers, CPhT Oncology Pharmacy Patient Advocate  [facility]  [phone] (phone) [phone] (fax) 04/16/2023 9:47 AM

## 2023-04-17 ENCOUNTER — Other Ambulatory Visit (HOSPITAL_COMMUNITY): Payer: Self-pay

## 2023-04-17 ENCOUNTER — Other Ambulatory Visit: Payer: Self-pay | Admitting: Oncology

## 2023-04-17 DIAGNOSIS — C9000 Multiple myeloma not having achieved remission: Secondary | ICD-10-CM

## 2023-04-17 MED ORDER — OXYCODONE-ACETAMINOPHEN 5-325 MG PO TABS: 1.0000 | ORAL_TABLET | Freq: Four times a day (QID) | ORAL | 0 refills | Status: DC | PRN

## 2023-04-17 NOTE — Telephone Encounter (Signed)
Patient returned me call. Patient only has [employer] for insurance. [employer] was cancelled. We were able to provide [facility] with patient's new Out of Pocket Max of $3,000 and patient has been "re-approved" for [facility] Patient Assistance. Patient knows to call me at [phone] when they change insurance to Medicare Part D in September so we can obtain a grant to cover new co-pay at that time.    Ardeen Fillers, CPhT Oncology Pharmacy Patient Advocate  [facility]  [phone] (phone) [phone] (fax) 04/17/2023 10:27 AM

## 2023-04-18 ENCOUNTER — Encounter: Payer: Self-pay | Admitting: Oncology

## 2023-04-18 ENCOUNTER — Other Ambulatory Visit: Payer: Self-pay | Admitting: *Deleted

## 2023-04-18 MED ORDER — LORAZEPAM 0.5 MG PO TABS: ORAL_TABLET | ORAL | 0 refills | Status: DC

## 2023-04-18 NOTE — Telephone Encounter (Signed)
Jordan King sent MyChart message to cancel his January appointment due to recovering from Flu A. Asking for late February. Had his myeloma labs at [facility] on 1/21. Requests refills of oxycodone/ativan and new script for prednisone. He has been taking left over prednisone for respiratory symptoms and asking for Dr. Truett Perna to call new script. Per Dr. Truett Perna: OK to reschedule appointment and ativan and oxycodone will be refilled. Needs to see PCP if his breathing is severe enough to need prednisone. He will not send this script in. Jordan King made aware.

## 2023-04-19 ENCOUNTER — Inpatient Hospital Stay: Payer: BLUE CROSS/BLUE SHIELD

## 2023-04-19 ENCOUNTER — Inpatient Hospital Stay: Payer: BLUE CROSS/BLUE SHIELD | Admitting: Oncology

## 2023-04-19 ENCOUNTER — Other Ambulatory Visit: Payer: Self-pay

## 2023-04-24 ENCOUNTER — Telehealth: Payer: Self-pay

## 2023-04-24 NOTE — Telephone Encounter (Signed)
Spoke with pt regarding appts, pt verbalizes understanding for appts on 05/22/2023 and agrees with plan of care

## 2023-05-02 ENCOUNTER — Other Ambulatory Visit: Payer: Self-pay

## 2023-05-20 ENCOUNTER — Other Ambulatory Visit: Payer: Self-pay

## 2023-05-20 IMAGING — DX DG BONE SURVEY MET
8 of 10 series · 8 of 10 positions shown · non-contrast
Comparison: CTs from May 21, 2021

CLINICAL DATA: Multiple myeloma

EXAM:
METASTATIC BONE SURVEY

[skull lat]
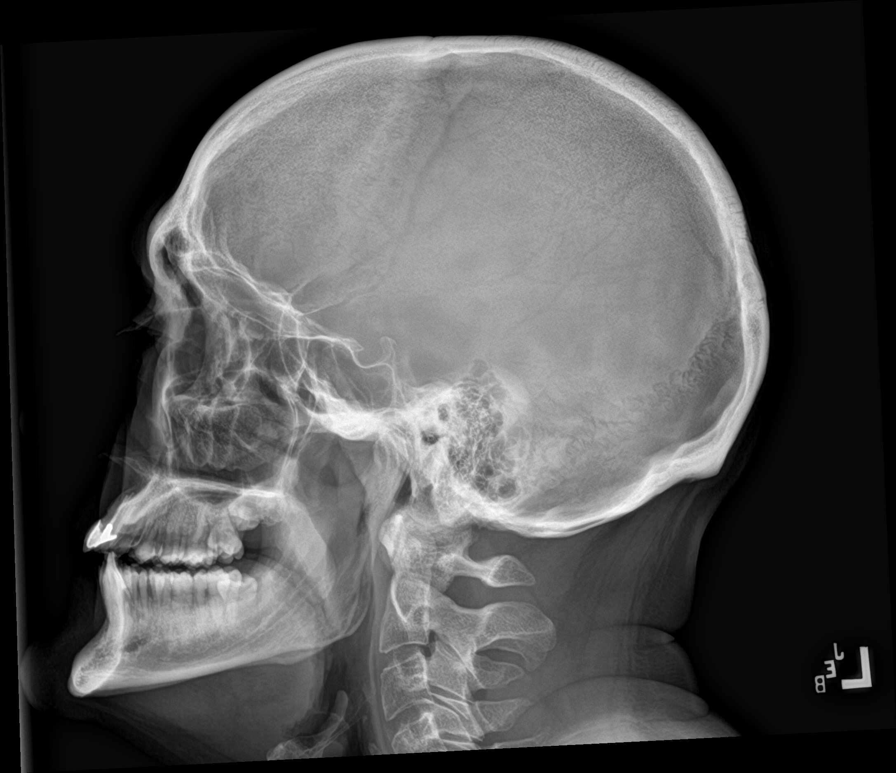

[shoulder ap (1 of 2)]
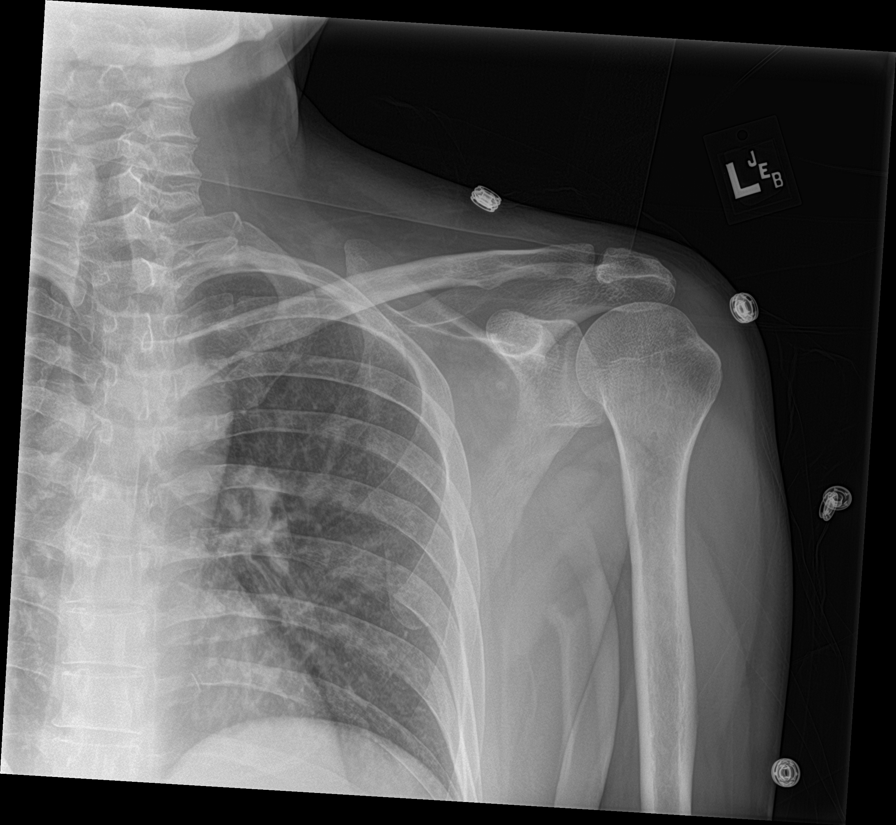

[shoulder ap (2 of 2)]
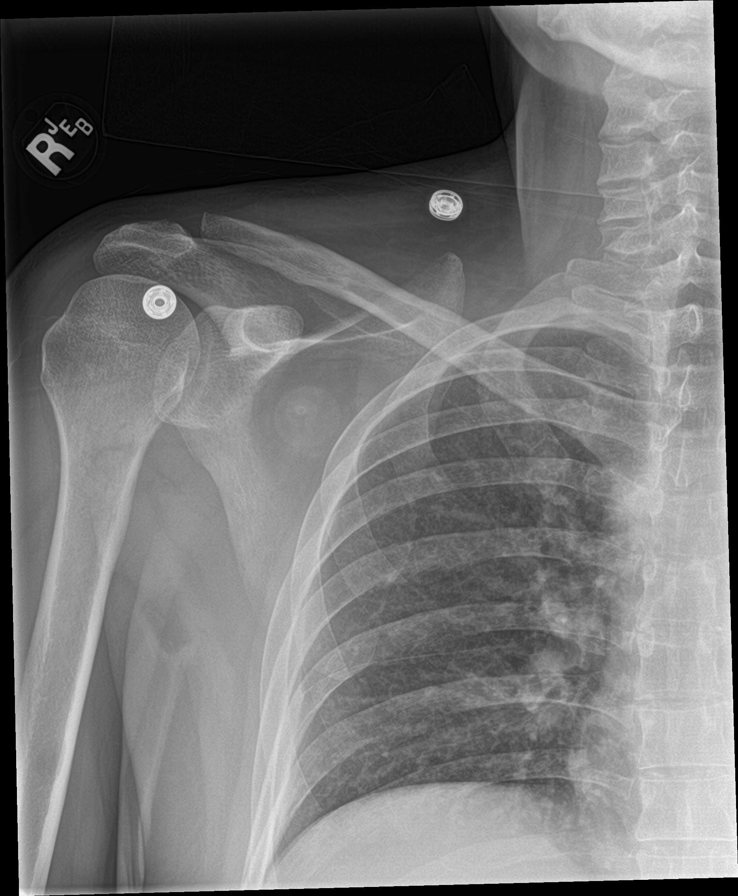

[humerus ap (1 of 2)]
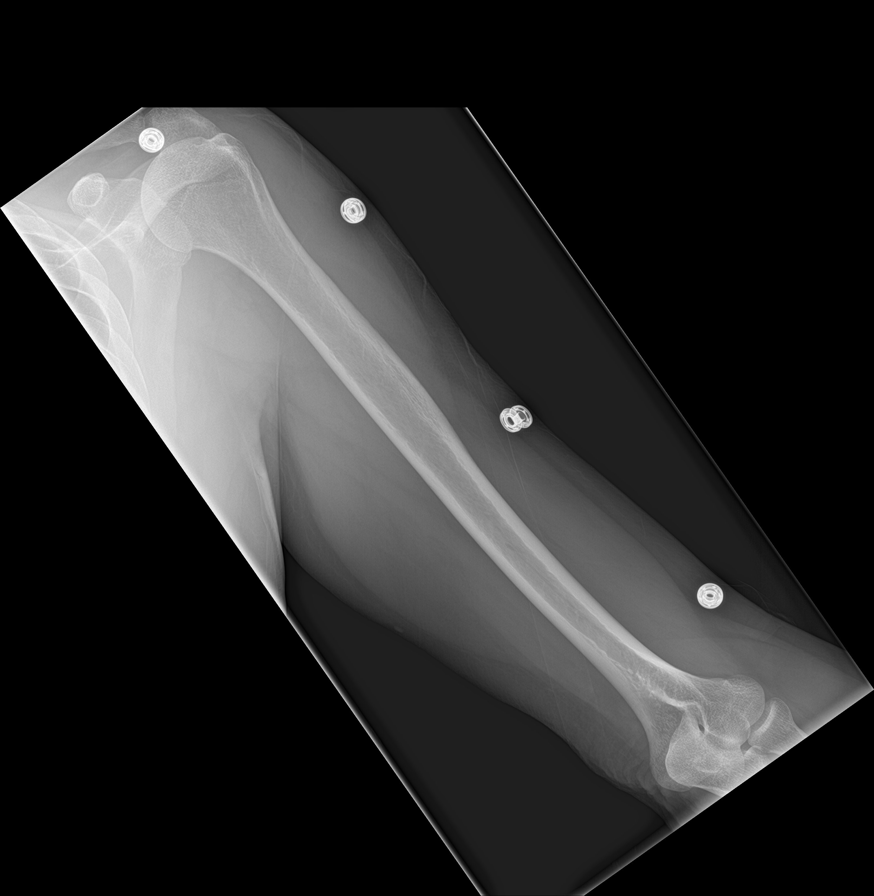

[humerus ap (2 of 2)]
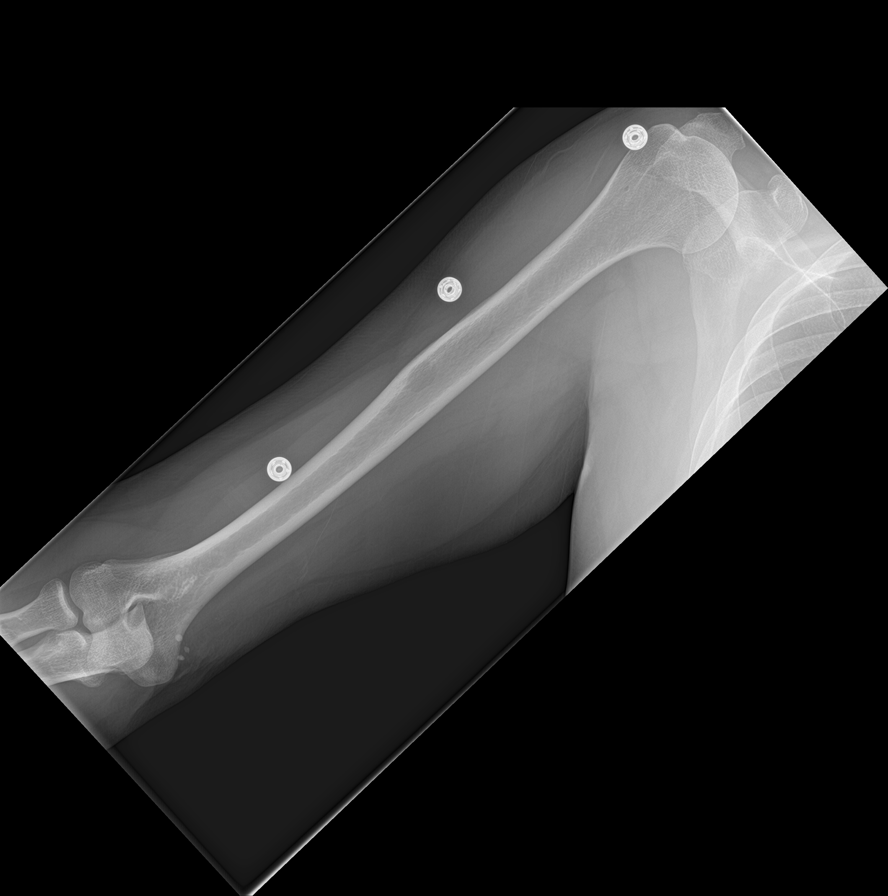

[forearm ap (1 of 2)]
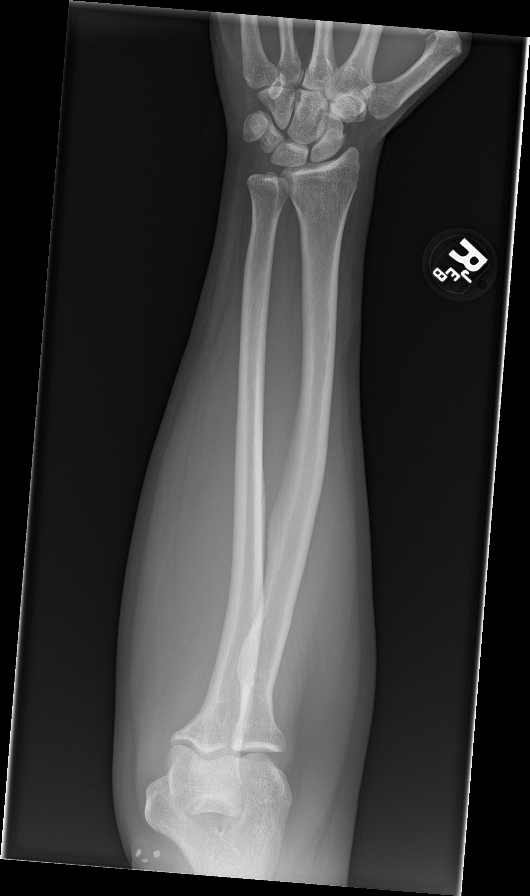

[forearm ap (2 of 2)]
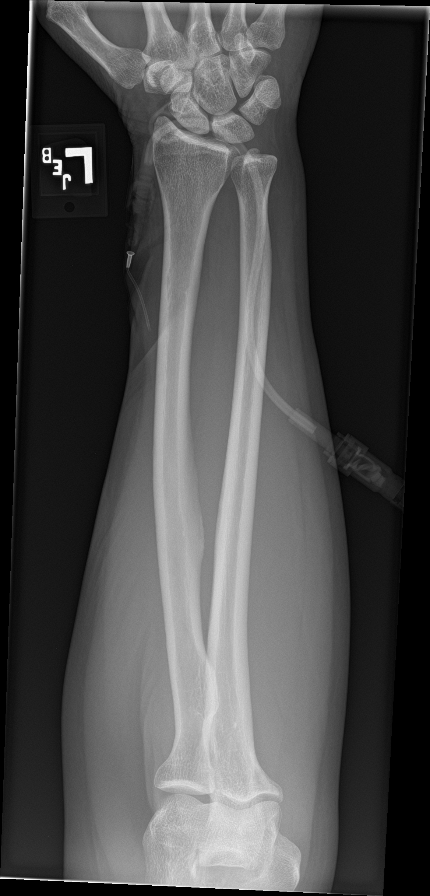

[c-spine ap]
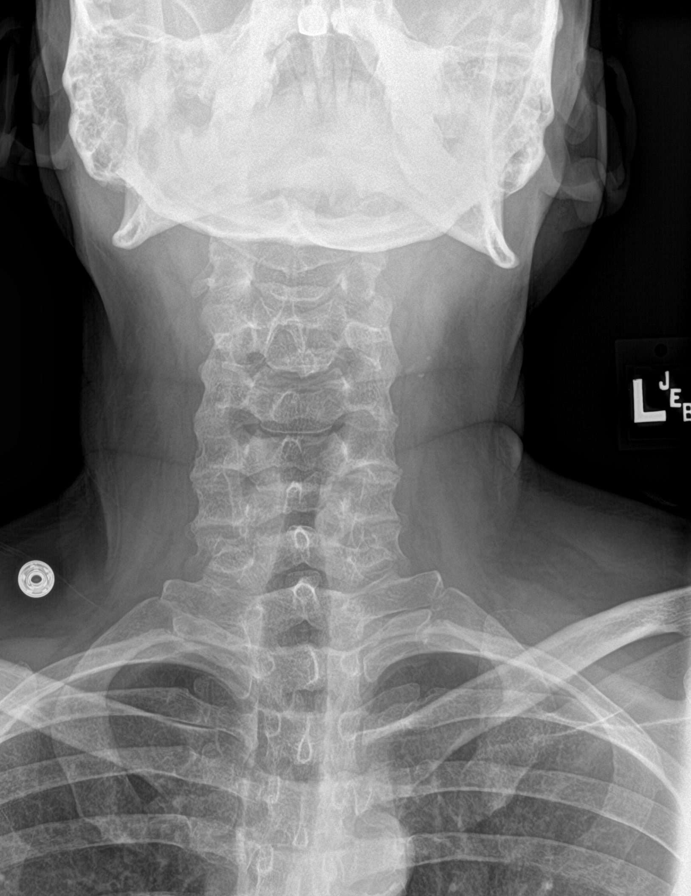

[8 of 10 positions shown; findings below may reference images not displayed]

FINDINGS: No discrete lucent lesions in the calvarium.

Scattered tiny lucencies throughout the spine, pelvis and proximal
femurs, better seen on prior CTs likely reflect disease involvement.

Suspected lucent lesion in the midshaft of the right humerus, may
also reflect involvement.

No focal airspace consolidation. No pleural effusion or
pneumothorax. Nonobstructive bowel gas pattern. Aortic
atherosclerosis.
IMPRESSION: Scattered tiny lucencies throughout the spine, pelvis and proximal
femurs, better seen on prior CTs, most likely reflecting disease
involvement.

Suspected lucent lesion in the midshaft of the right humerus, also
may reflect myelomatous disease involvement.

## 2023-05-22 ENCOUNTER — Inpatient Hospital Stay: Payer: BLUE CROSS/BLUE SHIELD | Attending: Oncology | Admitting: Oncology

## 2023-05-22 ENCOUNTER — Other Ambulatory Visit: Payer: BLUE CROSS/BLUE SHIELD

## 2023-05-22 ENCOUNTER — Inpatient Hospital Stay: Payer: BLUE CROSS/BLUE SHIELD

## 2023-05-22 VITALS — BP 110/66 | HR 97 | Temp 97.9°F | Resp 18 | Wt 162.0 lb

## 2023-05-22 DIAGNOSIS — C9000 Multiple myeloma not having achieved remission: Secondary | ICD-10-CM

## 2023-05-22 DIAGNOSIS — Z7969 Long term (current) use of other immunomodulators and immunosuppressants: Secondary | ICD-10-CM | POA: Insufficient documentation

## 2023-05-22 DIAGNOSIS — D649 Anemia, unspecified: Secondary | ICD-10-CM | POA: Diagnosis not present

## 2023-05-22 LAB — CBC WITH DIFFERENTIAL (CANCER CENTER ONLY)
Abs Immature Granulocytes: 0.02 10*3/uL (ref 0.00–0.07)
Basophils Absolute: 0 10*3/uL (ref 0.0–0.1)
Basophils Relative: 0 %
Eosinophils Absolute: 0.3 10*3/uL (ref 0.0–0.5)
Eosinophils Relative: 3 %
HCT: 44 % (ref 39.0–52.0)
Hemoglobin: 15.2 g/dL (ref 13.0–17.0)
Immature Granulocytes: 0 %
Lymphocytes Relative: 12 %
Lymphs Abs: 1.1 10*3/uL (ref 0.7–4.0)
MCH: 33 pg (ref 26.0–34.0)
MCHC: 34.5 g/dL (ref 30.0–36.0)
MCV: 95.4 fL (ref 80.0–100.0)
Monocytes Absolute: 1.1 10*3/uL — ABNORMAL HIGH (ref 0.1–1.0)
Monocytes Relative: 12 %
Neutro Abs: 6.4 10*3/uL (ref 1.7–7.7)
Neutrophils Relative %: 73 %
Platelet Count: 210 10*3/uL (ref 150–400)
RBC: 4.61 MIL/uL (ref 4.22–5.81)
RDW: 15.7 % — ABNORMAL HIGH (ref 11.5–15.5)
WBC Count: 8.9 10*3/uL (ref 4.0–10.5)
nRBC: 0 % (ref 0.0–0.2)

## 2023-05-22 LAB — CMP (CANCER CENTER ONLY)
ALT: 12 U/L (ref 0–44)
AST: 11 U/L — ABNORMAL LOW (ref 15–41)
Albumin: 4.5 g/dL (ref 3.5–5.0)
Alkaline Phosphatase: 66 U/L (ref 38–126)
Anion gap: 10 (ref 5–15)
BUN: 16 mg/dL (ref 6–20)
CO2: 27 mmol/L (ref 22–32)
Calcium: 9.7 mg/dL (ref 8.9–10.3)
Chloride: 100 mmol/L (ref 98–111)
Creatinine: 0.97 mg/dL (ref 0.61–1.24)
GFR, Estimated: >60 mL/min (ref >60)
Glucose, Bld: 103 mg/dL — ABNORMAL HIGH (ref 70–99)
Potassium: 3.6 mmol/L (ref 3.5–5.1)
Sodium: 137 mmol/L (ref 135–145)
Total Bilirubin: 0.4 mg/dL (ref 0.0–1.2)
Total Protein: 7.1 g/dL (ref 6.5–8.1)

## 2023-05-22 MED ORDER — LORAZEPAM 0.5 MG PO TABS: ORAL_TABLET | ORAL | 0 refills | Status: DC

## 2023-05-22 MED ORDER — VENETOCLAX 100 MG PO TABS
100.0000 mg | ORAL_TABLET | Freq: Every day | ORAL | 3 refills | Status: DC
Start: 2023-05-22 — End: 2023-10-31

## 2023-05-22 MED ORDER — OXYCODONE-ACETAMINOPHEN 5-325 MG PO TABS: 1.0000 | ORAL_TABLET | Freq: Four times a day (QID) | ORAL | 0 refills | Status: DC | PRN

## 2023-05-22 NOTE — Progress Notes (Signed)
 [facility] OFFICE PROGRESS NOTE   Diagnosis: Multiple myeloma  INTERVAL HISTORY:   Mr. Valenza  returns as scheduled.  He reports 2 episodes of influenza A over the past few months.  He has symptoms from the most recent illness resolved 2 weeks ago. He saw Dr. Anne Hahn on 04/10/2023.  The serum light chains were normal and an immunofixation was polyclonal.  He continues to have intermittent tooth pain.  He has not seen a dentist.   Objective:  Vital signs in last 24 hours:  Blood pressure 110/66, pulse 97, temperature 97.9 F (36.6 C), temperature source Temporal, resp. rate 18, weight 162 lb (73.5 kg), SpO2 98%.    HEENT: Periodontal disease Resp: Lungs clear bilaterally Cardio: Regular rate and rhythm GI: No hepatosplenomegaly Vascular: No leg edema  Lab Results:  Lab Results  Component Value Date   WBC 8.9 05/22/2023   HGB 15.2 05/22/2023   HCT 44.0 05/22/2023   MCV 95.4 05/22/2023   PLT 210 05/22/2023   NEUTROABS 6.4 05/22/2023    CMP  Lab Results  Component Value Date   NA 137 05/22/2023   K 3.6 05/22/2023   CL 100 05/22/2023   CO2 27 05/22/2023   GLUCOSE 103 (H) 05/22/2023   BUN 16 05/22/2023   CREATININE 0.97 05/22/2023   CALCIUM 9.7 05/22/2023   PROT 7.1 05/22/2023   ALBUMIN 4.5 05/22/2023   AST 11 (L) 05/22/2023   ALT 12 05/22/2023   ALKPHOS 66 05/22/2023   BILITOT 0.4 05/22/2023   GFRNONAA >60 05/22/2023    No results found for: "CEA1", "CEA", "CAN199", "CA125"  Lab Results  Component Value Date   INR 1.1 05/22/2021   LABPROT 14.4 05/22/2021    Imaging:  No results found.  Medications: I have reviewed the patient's current medications.   Assessment/Plan: Multiple myeloma -05/22/2021-kappa free light chain 11.3, lambda free light chain 17,028 -25 g proteinuria, 20 g lambda light chains -Serum immunofixation 05/22/2021-monoclonal lambda light chains -Bone marrow biopsy 05/23/2021-hypercellular marrow involved by plasma cell  myeloma, 80-90% plasma cells on the bone marrow biopsy, diminished iron stains -Cytogenetics-45 X, minus Y, t11:14 (11 q13) -Cycle 1 Velcade/Decadron 05/23/2021 (Decadron given 05/22/2021) -Cycle 1 DRVd 06/13/2021, treatment held 06/20/2021 due to a rash, Revlimid resumed 06/28/2021, daratumumab resumed 07/04/2021 Revlimid resumed 06/28/2021 Cycle 2 daratumumab/Revlimid/Decadron/Velcade 07/18/2021 (Revlimid started 07/19/2021, placed on hold 07/25/2021 secondary to a rash-last taken 07/24/2021) Treatment continued with weekly Velcade/Decadron and daratumumab Treatment changed to pomalidomide, daratumumab, Decadron 10/03/2021 Pomalidomide placed on hold 10/17/2021 due to a rash Pomalidomide resumed at a reduced dose of 2 mg daily for 21 days followed by 7-day break 10/31/2021 Pomalidomide 2 mg daily for 21 days beginning 11/28/2021 Lambda light chains increased 12/12/2021 Venetoclax/Carfilzomib/Decadron 12/26/2021, venetoclax started 01/05/2022 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 CARFILZOMIB/Decadron weekly x3 beginning 01/16/2022 Venetoclax resumed 01/19/2022 100 mg daily Carfilzomib/Decadron weekly x 3 beginning 02/13/2022 (day 15 not given) Carfilzomib /Decadron weekly x 3 beginning 03/06/2022 Carfilzomib/Decadron weekly x 3 beginning 04/04/2022 Carfilzomib/Decadron weekly x 3 beginning 05/09/2022 Carfilzomib/Decadron weekly x 3 beginning 06/07/2022 Last carfilzomib 06/20/2022 PET 06/29/2022-no evidence of active myeloma healing right anterior fourth rib fracture Bone marrow biopsy 07/06/2022-mildly hypocellular bone marrow (20%), no increase in plasma cells, MRD negative Normal kappa and lambda light chains 08/03/2022 Carfilzomib 08/22/2022 10/05/2022-melphalan 10/06/2022-stem cell infusion Bone marrow biopsy 01/16/2023-mildly hypocellular marrow, no increase in plasma cells, MRD -46 XY 01/16/2023-serum IFE-polyclonal, normal free light chains 01/16/2023: PET-no definite FDG avid  lesions, uptake  at L3 compression deformity or focal muscular uptake in the right calf-physiologic? 03/08/2023: Venetoclax 100 mg daily   Renal failure secondary to #1-improved Hypercalcemia-status post treat with calcitonin, pamidronate 05/22/2021-resolved Lytic bone lesions Anemia Thrombocytopenia Hypocalcemia-IV calcium gluconate 05/30/2021, resolved Rash 06/20/2021,  consistent with a drug rash, recurrent rash 07/24/2021-Revlimid placed on hold; recurrent rash 10/17/2021 Admission 01/09/2022 with renal failure, elevated uric acid, rasburicase 01/09/2022 Wheezing/cough-likely secondary to COPD and asthma      Disposition: Mr Alcock appears stable.  He continues venetoclax maintenance.  He has poor dentition and intermittent tooth pain.  He has not been evaluated by dentist.  Zometa will remain on hold until he sees a dentist.  Mr. Stefanko will return for an office visit and myeloma panel in 1 month.  Thornton Papas, MD  05/22/2023  12:52 PM

## 2023-05-23 ENCOUNTER — Other Ambulatory Visit: Payer: Self-pay

## 2023-05-24 ENCOUNTER — Other Ambulatory Visit: Payer: Self-pay | Admitting: *Deleted

## 2023-05-24 DIAGNOSIS — C9000 Multiple myeloma not having achieved remission: Secondary | ICD-10-CM

## 2023-05-24 MED ORDER — LORAZEPAM 0.5 MG PO TABS: ORAL_TABLET | ORAL | 0 refills | Status: DC

## 2023-05-28 ENCOUNTER — Encounter: Payer: Self-pay | Admitting: Oncology

## 2023-05-31 ENCOUNTER — Other Ambulatory Visit: Payer: Self-pay

## 2023-06-02 ENCOUNTER — Other Ambulatory Visit: Payer: Self-pay | Admitting: Oncology

## 2023-06-02 DIAGNOSIS — C9000 Multiple myeloma not having achieved remission: Secondary | ICD-10-CM

## 2023-06-04 ENCOUNTER — Encounter: Payer: Self-pay | Admitting: Oncology

## 2023-06-26 ENCOUNTER — Other Ambulatory Visit: Payer: Self-pay | Admitting: Oncology

## 2023-06-26 ENCOUNTER — Other Ambulatory Visit: Payer: Self-pay | Admitting: Nurse Practitioner

## 2023-06-26 DIAGNOSIS — C9000 Multiple myeloma not having achieved remission: Secondary | ICD-10-CM

## 2023-06-26 MED ORDER — OXYCODONE-ACETAMINOPHEN 5-325 MG PO TABS: 1.0000 | ORAL_TABLET | Freq: Four times a day (QID) | ORAL | 0 refills | Status: DC | PRN

## 2023-06-26 MED ORDER — LORAZEPAM 0.5 MG PO TABS: ORAL_TABLET | ORAL | 0 refills | Status: DC

## 2023-06-29 ENCOUNTER — Inpatient Hospital Stay (HOSPITAL_BASED_OUTPATIENT_CLINIC_OR_DEPARTMENT_OTHER): Admitting: Oncology

## 2023-06-29 ENCOUNTER — Inpatient Hospital Stay: Attending: Oncology

## 2023-06-29 VITALS — BP 111/81 | HR 94 | Temp 98.1°F | Resp 18 | Ht 68.0 in | Wt 164.3 lb

## 2023-06-29 DIAGNOSIS — D649 Anemia, unspecified: Secondary | ICD-10-CM | POA: Insufficient documentation

## 2023-06-29 DIAGNOSIS — C9000 Multiple myeloma not having achieved remission: Secondary | ICD-10-CM | POA: Diagnosis not present

## 2023-06-29 DIAGNOSIS — Z7969 Long term (current) use of other immunomodulators and immunosuppressants: Secondary | ICD-10-CM | POA: Diagnosis not present

## 2023-06-29 DIAGNOSIS — D696 Thrombocytopenia, unspecified: Secondary | ICD-10-CM | POA: Diagnosis not present

## 2023-06-29 LAB — CBC WITH DIFFERENTIAL (CANCER CENTER ONLY)
Abs Immature Granulocytes: 0.02 10*3/uL (ref 0.00–0.07)
Basophils Absolute: 0.1 10*3/uL (ref 0.0–0.1)
Basophils Relative: 0 %
Eosinophils Absolute: 0.2 10*3/uL (ref 0.0–0.5)
Eosinophils Relative: 2 %
HCT: 44.7 % (ref 39.0–52.0)
Hemoglobin: 15.6 g/dL (ref 13.0–17.0)
Immature Granulocytes: 0 %
Lymphocytes Relative: 12 %
Lymphs Abs: 1.4 10*3/uL (ref 0.7–4.0)
MCH: 33.7 pg (ref 26.0–34.0)
MCHC: 34.9 g/dL (ref 30.0–36.0)
MCV: 96.5 fL (ref 80.0–100.0)
Monocytes Absolute: 1.2 10*3/uL — ABNORMAL HIGH (ref 0.1–1.0)
Monocytes Relative: 10 %
Neutro Abs: 8.9 10*3/uL — ABNORMAL HIGH (ref 1.7–7.7)
Neutrophils Relative %: 76 %
Platelet Count: 200 10*3/uL (ref 150–400)
RBC: 4.63 MIL/uL (ref 4.22–5.81)
RDW: 14.2 % (ref 11.5–15.5)
WBC Count: 11.8 10*3/uL — ABNORMAL HIGH (ref 4.0–10.5)
nRBC: 0 % (ref 0.0–0.2)

## 2023-06-29 LAB — CMP (CANCER CENTER ONLY)
ALT: 10 U/L (ref 0–44)
AST: 10 U/L — ABNORMAL LOW (ref 15–41)
Albumin: 4.3 g/dL (ref 3.5–5.0)
Alkaline Phosphatase: 69 U/L (ref 38–126)
Anion gap: 9 (ref 5–15)
BUN: 17 mg/dL (ref 6–20)
CO2: 27 mmol/L (ref 22–32)
Calcium: 9.5 mg/dL (ref 8.9–10.3)
Chloride: 101 mmol/L (ref 98–111)
Creatinine: 1.06 mg/dL (ref 0.61–1.24)
GFR, Estimated: >60 mL/min (ref >60)
Glucose, Bld: 91 mg/dL (ref 70–99)
Potassium: 3.6 mmol/L (ref 3.5–5.1)
Sodium: 137 mmol/L (ref 135–145)
Total Bilirubin: 0.3 mg/dL (ref 0.0–1.2)
Total Protein: 7.4 g/dL (ref 6.5–8.1)

## 2023-06-29 NOTE — Progress Notes (Signed)
 [facility] OFFICE PROGRESS NOTE   Diagnosis: Multiple myeloma  INTERVAL HISTORY:   Jordan King returns as scheduled.  He continues to have "sensitivity "of the teeth.  He is scheduled for a dental appointment 07/19/2023.  He continues venetoclax.  No mouth sores or diarrhea.  He uses Lotrisone cream on areas of dryness.  He takes oxycodone as needed for back pain.  He had an episode of anterior chest pain earlier this week lasting 5 minutes.  He took Ativan.  He reports undergoing multiple vaccines via his primary doctor including a pneumococcal vaccine.  Objective:  Vital signs in last 24 hours:  Blood pressure 111/81, pulse 94, temperature 98.1 F (36.7 C), temperature source Temporal, resp. rate 18, height 5\' 8"  (1.727 m), weight 164 lb 4.8 oz (74.5 kg), SpO2 98%.    HEENT: No thrush or ulcers Resp: Lungs clear bilaterally Cardio: Regular rate and rhythm GI: No hepatosplenomegaly Vascular: No leg edema  Skin: Area of dryness and mild erythema at the right lobe pretibial region  Lab Results:  Lab Results  Component Value Date   WBC 11.8 (H) 06/29/2023   HGB 15.6 06/29/2023   HCT 44.7 06/29/2023   MCV 96.5 06/29/2023   PLT 200 06/29/2023   NEUTROABS 8.9 (H) 06/29/2023    CMP  Lab Results  Component Value Date   NA 137 06/29/2023   K 3.6 06/29/2023   CL 101 06/29/2023   CO2 27 06/29/2023   GLUCOSE 91 06/29/2023   BUN 17 06/29/2023   CREATININE 1.06 06/29/2023   CALCIUM 9.5 06/29/2023   PROT 7.4 06/29/2023   ALBUMIN 4.3 06/29/2023   AST 10 (L) 06/29/2023   ALT 10 06/29/2023   ALKPHOS 69 06/29/2023   BILITOT 0.3 06/29/2023   GFRNONAA >60 06/29/2023    No results found for: "CEA1", "CEA", "CAN199", "CA125"  Lab Results  Component Value Date   INR 1.1 05/22/2021   LABPROT 14.4 05/22/2021    Imaging:  No results found.  Medications: I have reviewed the patient's current medications.   Assessment/Plan:  Multiple myeloma -05/22/2021-kappa  free light chain 11.3, lambda free light chain 17,028 -25 g proteinuria, 20 g lambda light chains -Serum immunofixation 05/22/2021-monoclonal lambda light chains -Bone marrow biopsy 05/23/2021-hypercellular marrow involved by plasma cell myeloma, 80-90% plasma cells on the bone marrow biopsy, diminished iron stains -[employer]ytogen[employer]-45 X, minus Y, t11:14 (11 q13) -Cycle 1 Velcade/Decadron 05/23/2021 (Decadron given 05/22/2021) -Cycle 1 DRVd 06/13/2021, treatment held 06/20/2021 due to a rash, Revlimid resumed 06/28/2021, daratumumab resumed 07/04/2021 Revlimid resumed 06/28/2021 Cycle 2 daratumumab/Revlimid/Decadron/Velcade 07/18/2021 (Revlimid started 07/19/2021, placed on hold 07/25/2021 secondary to a rash-last taken 07/24/2021) Treatment continued with weekly Velcade/Decadron and daratumumab Treatment changed to pomalidomide, daratumumab, Decadron 10/03/2021 Pomalidomide placed on hold 10/17/2021 due to a rash Pomalidomide resumed at a reduced dose of 2 mg daily for 21 days followed by 7-day break 10/31/2021 Pomalidomide 2 mg daily for 21 days beginning 11/28/2021 Lambda light chains increased 12/12/2021 Venetoclax/Carfilzomib/Decadron 12/26/2021, venetoclax started 01/05/2022 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 CARFILZOMIB/Decadron weekly x3 beginning 01/16/2022 Venetoclax resumed 01/19/2022 100 mg daily Carfilzomib/Decadron weekly x 3 beginning 02/13/2022 (day 15 not given) Carfilzomib /Decadron weekly x 3 beginning 03/06/2022 Carfilzomib/Decadron weekly x 3 beginning 04/04/2022 Carfilzomib/Decadron weekly x 3 beginning 05/09/2022 Carfilzomib/Decadron weekly x 3 beginning 06/07/2022 Last carfilzomib 06/20/2022 PET 06/29/2022-no evidence of active myeloma healing right anterior fourth rib fracture Bone marrow biopsy 07/06/2022-mildly hypocellular bone marrow (20%), no increase in plasma cells,  MRD negative Normal kappa and lambda light chains 08/03/2022 Carfilzomib  08/22/2022 10/05/2022-melphalan 10/06/2022-stem cell infusion Bone marrow biopsy 01/16/2023-mildly hypocellular marrow, no increase in plasma cells, MRD -46 XY 01/16/2023-serum IFE-polyclonal, normal free light chains 01/16/2023: PET-no definite FDG avid lesions, uptake at L3 compression deformity or focal muscular uptake in the right calf-physiologic? 03/08/2023: Venetoclax 100 mg daily   Renal failure secondary to #1-improved Hypercalcemia-status post treat with calcitonin, pamidronate 05/22/2021-resolved Lytic bone lesions Anemia Thrombocytopenia Hypocalcemia-IV calcium gluconate 05/30/2021, resolved Rash 06/20/2021,  consistent with a drug rash, recurrent rash 07/24/2021-Revlimid placed on hold; recurrent rash 10/17/2021 Admission 01/09/2022 with renal failure, elevated uric acid, rasburicase 01/09/2022 Wheezing/cough-likely secondary to COPD and asthma      Disposition: Jordan King appears stable.  He continues venetoclax maintenance.  We will follow-up on the myeloma panel from today. I recommend a cardiology evaluation if he has recurrent chest pain.  He is scheduled to see dental medicine 07/19/2023.  He will return for an office visit with the plan to begin Zometa during the week of 07/30/2023.  Jordan Papas, MD  06/29/2023  11:06 AM

## 2023-06-30 ENCOUNTER — Other Ambulatory Visit: Payer: Self-pay

## 2023-07-02 ENCOUNTER — Telehealth: Payer: Self-pay

## 2023-07-02 LAB — MULTIPLE MYELOMA PANEL, SERUM
Albumin SerPl Elph-Mcnc: 3.7 g/dL (ref 2.9–4.4)
Albumin/Glob SerPl: 1.2 (ref 0.7–1.7)
Alpha 1: 0.3 g/dL (ref 0.0–0.4)
Alpha2 Glob SerPl Elph-Mcnc: 1.1 g/dL — ABNORMAL HIGH (ref 0.4–1.0)
B-Globulin SerPl Elph-Mcnc: 1.1 g/dL (ref 0.7–1.3)
Gamma Glob SerPl Elph-Mcnc: 0.6 g/dL (ref 0.4–1.8)
Globulin, Total: 3.1 g/dL (ref 2.2–3.9)
IgA: 93 mg/dL (ref 90–386)
IgG (Immunoglobin G), Serum: 653 mg/dL (ref 603–1613)
IgM (Immunoglobulin M), Srm: 28 mg/dL (ref 20–172)
Total Protein ELP: 6.8 g/dL (ref 6.0–8.5)

## 2023-07-02 LAB — KAPPA/LAMBDA LIGHT CHAINS
Kappa free light chain: 12.9 mg/L (ref 3.3–19.4)
Kappa, lambda light chain ratio: 1.55 (ref 0.26–1.65)
Lambda free light chains: 8.3 mg/L (ref 5.7–26.3)

## 2023-07-02 NOTE — Telephone Encounter (Signed)
-----   Message from Coni Deep sent at 07/02/2023  6:51 AM EDT ----- Please call patient, myeloma panel is negative, f/u as scheduled

## 2023-07-02 NOTE — Telephone Encounter (Signed)
 Patient gave verbal understanding and had no further questions or concerns

## 2023-07-10 ENCOUNTER — Other Ambulatory Visit: Payer: Self-pay

## 2023-07-27 ENCOUNTER — Other Ambulatory Visit: Payer: Self-pay | Admitting: Nurse Practitioner

## 2023-07-27 ENCOUNTER — Other Ambulatory Visit: Payer: Self-pay | Admitting: Oncology

## 2023-07-27 DIAGNOSIS — C9000 Multiple myeloma not having achieved remission: Secondary | ICD-10-CM

## 2023-07-27 MED ORDER — OXYCODONE-ACETAMINOPHEN 5-325 MG PO TABS: 1.0000 | ORAL_TABLET | Freq: Four times a day (QID) | ORAL | 0 refills | Status: DC | PRN

## 2023-08-02 ENCOUNTER — Inpatient Hospital Stay

## 2023-08-02 ENCOUNTER — Inpatient Hospital Stay: Attending: Oncology

## 2023-08-02 ENCOUNTER — Inpatient Hospital Stay (HOSPITAL_BASED_OUTPATIENT_CLINIC_OR_DEPARTMENT_OTHER): Admitting: Oncology

## 2023-08-02 VITALS — BP 114/78 | HR 87 | Temp 98.2°F | Resp 18 | Ht 68.0 in | Wt 166.0 lb

## 2023-08-02 DIAGNOSIS — D649 Anemia, unspecified: Secondary | ICD-10-CM | POA: Diagnosis not present

## 2023-08-02 DIAGNOSIS — D696 Thrombocytopenia, unspecified: Secondary | ICD-10-CM | POA: Insufficient documentation

## 2023-08-02 DIAGNOSIS — C9000 Multiple myeloma not having achieved remission: Secondary | ICD-10-CM | POA: Diagnosis not present

## 2023-08-02 DIAGNOSIS — Z7969 Long term (current) use of other immunomodulators and immunosuppressants: Secondary | ICD-10-CM | POA: Diagnosis not present

## 2023-08-02 LAB — CMP (CANCER CENTER ONLY)
ALT: 14 U/L (ref 0–44)
AST: 16 U/L (ref 15–41)
Albumin: 4.2 g/dL (ref 3.5–5.0)
Alkaline Phosphatase: 77 U/L (ref 38–126)
Anion gap: 14 (ref 5–15)
BUN: 20 mg/dL (ref 6–20)
CO2: 23 mmol/L (ref 22–32)
Calcium: 9.9 mg/dL (ref 8.9–10.3)
Chloride: 100 mmol/L (ref 98–111)
Creatinine: 1.1 mg/dL (ref 0.61–1.24)
GFR, Estimated: >60 mL/min (ref >60)
Glucose, Bld: 107 mg/dL — ABNORMAL HIGH (ref 70–99)
Potassium: 3.8 mmol/L (ref 3.5–5.1)
Sodium: 137 mmol/L (ref 135–145)
Total Bilirubin: 0.2 mg/dL (ref 0.0–1.2)
Total Protein: 7 g/dL (ref 6.5–8.1)

## 2023-08-02 LAB — CBC WITH DIFFERENTIAL (CANCER CENTER ONLY)
Abs Immature Granulocytes: 0.03 10*3/uL (ref 0.00–0.07)
Basophils Absolute: 0.1 10*3/uL (ref 0.0–0.1)
Basophils Relative: 1 %
Eosinophils Absolute: 0.2 10*3/uL (ref 0.0–0.5)
Eosinophils Relative: 2 %
HCT: 43.8 % (ref 39.0–52.0)
Hemoglobin: 15.1 g/dL (ref 13.0–17.0)
Immature Granulocytes: 0 %
Lymphocytes Relative: 13 %
Lymphs Abs: 1.2 10*3/uL (ref 0.7–4.0)
MCH: 33.7 pg (ref 26.0–34.0)
MCHC: 34.5 g/dL (ref 30.0–36.0)
MCV: 97.8 fL (ref 80.0–100.0)
Monocytes Absolute: 0.9 10*3/uL (ref 0.1–1.0)
Monocytes Relative: 10 %
Neutro Abs: 6.7 10*3/uL (ref 1.7–7.7)
Neutrophils Relative %: 74 %
Platelet Count: 169 10*3/uL (ref 150–400)
RBC: 4.48 MIL/uL (ref 4.22–5.81)
RDW: 13.5 % (ref 11.5–15.5)
WBC Count: 9 10*3/uL (ref 4.0–10.5)
nRBC: 0 % (ref 0.0–0.2)

## 2023-08-02 NOTE — Progress Notes (Signed)
 [facility] OFFICE PROGRESS NOTE   Diagnosis: Multiple myeloma  INTERVAL HISTORY:   Jordan King returns as scheduled.  Continues venetoclax .  He saw a dentist and says he has been cleared to begin Zometa .  He has diffuse tooth sensitivity.  He has developed "warts" over the right index finger.  He says these have been present intermittently for years.  He complains of left upper abdominal discomfort for the past 4-5 days.  He relates this to constipation.  No bleeding.  He began MiraLAX .  Objective:  Vital signs in last 24 hours:  Blood pressure 114/78, pulse 87, temperature 98.2 F (36.8 C), temperature source Temporal, resp. rate 18, height 5\' 8"  (1.727 m), weight 166 lb (75.3 kg), SpO2 98%.    HEENT: No thrush or ulcers Resp: Lungs clear bilaterally Cardio: Regular rate and rhythm GI: No hepatosplenomegaly, mild tenderness with deep palpation in the left mid to upper abdomen, no mass Vascular: No leg edema  Skin: 2  3mm papules at the right second finger.  Portacath/PICC-without erythema  Lab Results:  Lab Results  Component Value Date   WBC 9.0 08/02/2023   HGB 15.1 08/02/2023   HCT 43.8 08/02/2023   MCV 97.8 08/02/2023   PLT 169 08/02/2023   NEUTROABS 6.7 08/02/2023    CMP  Lab Results  Component Value Date   NA 137 08/02/2023   K 3.8 08/02/2023   CL 100 08/02/2023   CO2 23 08/02/2023   GLUCOSE 107 (H) 08/02/2023   BUN 20 08/02/2023   CREATININE 1.10 08/02/2023   CALCIUM  9.9 08/02/2023   PROT 7.0 08/02/2023   ALBUMIN 4.2 08/02/2023   AST 16 08/02/2023   ALT 14 08/02/2023   ALKPHOS 77 08/02/2023   BILITOT 0.2 08/02/2023   GFRNONAA >60 08/02/2023    Medications: I have reviewed the patient's current medications.   Assessment/Plan: Multiple myeloma -05/22/2021-kappa free light chain 11.3, lambda free light chain 17,028 -25 g proteinuria, 20 g lambda light chains -Serum immunofixation 05/22/2021-monoclonal lambda light chains -Bone marrow  biopsy 05/23/2021-hypercellular marrow involved by plasma cell myeloma, 80-90% plasma cells on the bone marrow biopsy, diminished iron stains -[employer]-45 X, minus Y, t11:14 (11 q13) -Cycle 1 Velcade /Decadron  05/23/2021 (Decadron  given 05/22/2021) -Cycle 1 DRVd 06/13/2021, treatment held 06/20/2021 due to a rash, Revlimid  resumed 06/28/2021, daratumumab  resumed 07/04/2021 Revlimid  resumed 06/28/2021 Cycle 2 daratumumab /Revlimid /Decadron /Velcade  07/18/2021 (Revlimid  started 07/19/2021, placed on hold 07/25/2021 secondary to a rash-last taken 07/24/2021) Treatment continued with weekly Velcade /Decadron  and daratumumab  Treatment changed to pomalidomide , daratumumab , Decadron  10/03/2021 Pomalidomide  placed on hold 10/17/2021 due to a rash Pomalidomide  resumed at a reduced dose of 2 mg daily for 21 days followed by 7-day break 10/31/2021 Pomalidomide  2 mg daily for 21 days beginning 11/28/2021 Lambda light chains increased 12/12/2021 Venetoclax /Carfilzomib /Decadron  12/26/2021, venetoclax  started 01/05/2022 Venetoclax  placed on hold 01/09/2022 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 CARFILZOMIB /Decadron  weekly x3 beginning 01/16/2022 Venetoclax  resumed 01/19/2022 100 mg daily Carfilzomib /Decadron  weekly x 3 beginning 02/13/2022 (day 15 not given) Carfilzomib  /Decadron  weekly x 3 beginning 03/06/2022 Carfilzomib /Decadron  weekly x 3 beginning 04/04/2022 Carfilzomib /Decadron  weekly x 3 beginning 05/09/2022 Carfilzomib /Decadron  weekly x 3 beginning 06/07/2022 Last carfilzomib  06/20/2022 PET 06/29/2022-no evidence of active myeloma healing right anterior fourth rib fracture Bone marrow biopsy 07/06/2022-mildly hypocellular bone marrow (20%), no increase in plasma cells, MRD negative Normal kappa and lambda light chains 08/03/2022 Carfilzomib  08/22/2022 10/05/2022-melphalan 10/06/2022-stem cell infusion Bone marrow biopsy 01/16/2023-mildly hypocellular marrow, no increase in plasma cells, MRD -46 XY 01/16/2023-serum IFE-polyclonal, normal  free light  chains 01/16/2023: PET-no definite FDG avid lesions, uptake at L3 compression deformity or focal muscular uptake in the right calf-physiologic? 03/08/2023: Venetoclax  100 mg daily   Renal failure secondary to #1-improved Hypercalcemia-status post treat with calcitonin, pamidronate  05/22/2021-resolved Lytic bone lesions Anemia Thrombocytopenia Hypocalcemia-IV calcium  gluconate 05/30/2021, resolved Rash 06/20/2021,  consistent with a drug rash, recurrent rash 07/24/2021-Revlimid  placed on hold; recurrent rash 10/17/2021 Admission 01/09/2022 with renal failure, elevated uric acid, rasburicase  01/09/2022 Wheezing/cough-likely secondary to COPD and asthma       Disposition: Jordan King is in clinical remission for multiple myeloma.  He continues venetoclax  maintenance.  Jordan King declines Zometa . The etiology of the small papules at the right second finger is unclear.  He will follow-up with dermatology if these progress.  The lesions do not appear vesicular.   Jordan King will return for an office and lab visit in 6 weeks.  He will call for persistent or increased abdominal pain.  Coni Deep, MD  08/02/2023  10:52 AM

## 2023-08-16 ENCOUNTER — Other Ambulatory Visit: Payer: Self-pay

## 2023-08-20 ENCOUNTER — Encounter: Payer: Self-pay | Admitting: Oncology

## 2023-08-31 ENCOUNTER — Other Ambulatory Visit: Payer: Self-pay | Admitting: Oncology

## 2023-08-31 ENCOUNTER — Other Ambulatory Visit: Payer: Self-pay | Admitting: Nurse Practitioner

## 2023-08-31 DIAGNOSIS — C9000 Multiple myeloma not having achieved remission: Secondary | ICD-10-CM

## 2023-08-31 MED ORDER — LORAZEPAM 0.5 MG PO TABS: ORAL_TABLET | ORAL | 0 refills | Status: DC

## 2023-08-31 MED ORDER — OXYCODONE-ACETAMINOPHEN 5-325 MG PO TABS: 1.0000 | ORAL_TABLET | Freq: Four times a day (QID) | ORAL | 0 refills | Status: DC | PRN

## 2023-09-03 ENCOUNTER — Encounter: Payer: Self-pay | Admitting: Oncology

## 2023-09-07 ENCOUNTER — Other Ambulatory Visit: Payer: Self-pay | Admitting: Oncology

## 2023-09-07 DIAGNOSIS — C9000 Multiple myeloma not having achieved remission: Secondary | ICD-10-CM

## 2023-10-05 ENCOUNTER — Other Ambulatory Visit: Payer: Self-pay | Admitting: Oncology

## 2023-10-05 ENCOUNTER — Encounter: Payer: Self-pay | Admitting: Oncology

## 2023-10-05 DIAGNOSIS — C9000 Multiple myeloma not having achieved remission: Secondary | ICD-10-CM

## 2023-10-07 ENCOUNTER — Other Ambulatory Visit: Payer: Self-pay | Admitting: Oncology

## 2023-10-07 DIAGNOSIS — C9000 Multiple myeloma not having achieved remission: Secondary | ICD-10-CM

## 2023-10-08 ENCOUNTER — Encounter: Payer: Self-pay | Admitting: Oncology

## 2023-10-08 ENCOUNTER — Other Ambulatory Visit: Payer: Self-pay | Admitting: Oncology

## 2023-10-08 DIAGNOSIS — C9000 Multiple myeloma not having achieved remission: Secondary | ICD-10-CM

## 2023-10-08 MED ORDER — OXYCODONE-ACETAMINOPHEN 5-325 MG PO TABS: 1.0000 | ORAL_TABLET | Freq: Four times a day (QID) | ORAL | 0 refills | Status: DC | PRN

## 2023-10-09 ENCOUNTER — Encounter: Payer: Self-pay | Admitting: Oncology

## 2023-10-31 ENCOUNTER — Other Ambulatory Visit: Payer: Self-pay | Admitting: *Deleted

## 2023-10-31 MED ORDER — VENETOCLAX 100 MG PO TABS: 100.0000 mg | ORAL_TABLET | Freq: Every day | ORAL | 1 refills | Status: DC

## 2023-10-31 NOTE — Telephone Encounter (Signed)
 Mr. Majerus requesting refill on his venetoclax . Noted that he did not get scheduled for his 6 week f/u after last visit in May. Scheduling message sent to schedule him for lab/OV in next 2 weeks with BS or Olam. Patient made aware.

## 2023-11-01 ENCOUNTER — Encounter: Payer: Self-pay | Admitting: *Deleted

## 2023-11-02 ENCOUNTER — Other Ambulatory Visit: Payer: Self-pay

## 2023-11-07 ENCOUNTER — Inpatient Hospital Stay

## 2023-11-07 ENCOUNTER — Inpatient Hospital Stay: Attending: Oncology | Admitting: Nurse Practitioner

## 2023-11-07 ENCOUNTER — Encounter: Payer: Self-pay | Admitting: Nurse Practitioner

## 2023-11-07 ENCOUNTER — Encounter: Payer: Self-pay | Admitting: Oncology

## 2023-11-07 DIAGNOSIS — C9000 Multiple myeloma not having achieved remission: Secondary | ICD-10-CM | POA: Diagnosis present

## 2023-11-07 DIAGNOSIS — D649 Anemia, unspecified: Secondary | ICD-10-CM | POA: Diagnosis not present

## 2023-11-07 DIAGNOSIS — D696 Thrombocytopenia, unspecified: Secondary | ICD-10-CM | POA: Diagnosis not present

## 2023-11-07 DIAGNOSIS — Z79899 Other long term (current) drug therapy: Secondary | ICD-10-CM | POA: Diagnosis not present

## 2023-11-07 LAB — CBC WITH DIFFERENTIAL (CANCER CENTER ONLY)
Abs Immature Granulocytes: 0.03 K/uL (ref 0.00–0.07)
Basophils Absolute: 0.1 K/uL (ref 0.0–0.1)
Basophils Relative: 1 %
Eosinophils Absolute: 0.3 K/uL (ref 0.0–0.5)
Eosinophils Relative: 3 %
HCT: 48.3 % (ref 39.0–52.0)
Hemoglobin: 17 g/dL (ref 13.0–17.0)
Immature Granulocytes: 0 %
Lymphocytes Relative: 13 %
Lymphs Abs: 1.3 K/uL (ref 0.7–4.0)
MCH: 34.1 pg — ABNORMAL HIGH (ref 26.0–34.0)
MCHC: 35.2 g/dL (ref 30.0–36.0)
MCV: 97 fL (ref 80.0–100.0)
Monocytes Absolute: 0.9 K/uL (ref 0.1–1.0)
Monocytes Relative: 9 %
Neutro Abs: 7.6 K/uL (ref 1.7–7.7)
Neutrophils Relative %: 74 %
Platelet Count: 151 K/uL (ref 150–400)
RBC: 4.98 MIL/uL (ref 4.22–5.81)
RDW: 14.1 % (ref 11.5–15.5)
WBC Count: 10.1 K/uL (ref 4.0–10.5)
nRBC: 0 % (ref 0.0–0.2)

## 2023-11-07 LAB — CMP (CANCER CENTER ONLY)
ALT: 15 U/L (ref 0–44)
AST: 16 U/L (ref 15–41)
Albumin: 4.3 g/dL (ref 3.5–5.0)
Alkaline Phosphatase: 89 U/L (ref 38–126)
Anion gap: 15 (ref 5–15)
BUN: 16 mg/dL (ref 6–20)
CO2: 21 mmol/L — ABNORMAL LOW (ref 22–32)
Calcium: 10.1 mg/dL (ref 8.9–10.3)
Chloride: 103 mmol/L (ref 98–111)
Creatinine: 1.13 mg/dL (ref 0.61–1.24)
GFR, Estimated: >60 mL/min (ref >60)
Glucose, Bld: 103 mg/dL — ABNORMAL HIGH (ref 70–99)
Potassium: 4.1 mmol/L (ref 3.5–5.1)
Sodium: 140 mmol/L (ref 135–145)
Total Bilirubin: 0.3 mg/dL (ref 0.0–1.2)
Total Protein: 7.3 g/dL (ref 6.5–8.1)

## 2023-11-07 MED ORDER — OXYCODONE-ACETAMINOPHEN 5-325 MG PO TABS: 1.0000 | ORAL_TABLET | Freq: Four times a day (QID) | ORAL | 0 refills | Status: DC | PRN

## 2023-11-07 MED ORDER — LORAZEPAM 0.5 MG PO TABS: ORAL_TABLET | ORAL | 0 refills | Status: DC

## 2023-11-07 NOTE — Progress Notes (Signed)
 [facility] OFFICE PROGRESS NOTE   Diagnosis: Multiple myeloma  INTERVAL HISTORY:   Jordan King returns as scheduled.  He continues venetoclax .  He notes mild nausea with each venetoclax  dose.  He does not have nausea if he takes Compazine  premedication.  No mouth sores.  No diarrhea.  No bleeding.  No fever or chills.  Appetite okay.  He has stable lower back pain.  He occasionally notes pain at the mid back.  He takes oxycodone  as needed.  Objective:  Vital signs in last 24 hours:  Blood pressure 113/88, pulse 96, temperature 97.9 F (36.6 C), temperature source Temporal, resp. rate 18, height 5' 8 (1.727 m), weight 170 lb 12.8 oz (77.5 kg), SpO2 98%.    HEENT: No thrush or ulcers. Resp: Lungs clear bilaterally. Cardio: Regular rate and rhythm. GI: No hepatosplenomegaly.  Nontender. Vascular: No leg edema. Skin: No rash.   Lab Results:  Lab Results  Component Value Date   WBC 10.1 11/07/2023   HGB 17.0 11/07/2023   HCT 48.3 11/07/2023   MCV 97.0 11/07/2023   PLT 151 11/07/2023   NEUTROABS 7.6 11/07/2023    Imaging:  No results found.  Medications: I have reviewed the patient's current medications.  Assessment/Plan: Multiple myeloma -05/22/2021-kappa free light chain 11.3, lambda free light chain 17,028 -25 g proteinuria, 20 g lambda light chains -Serum immunofixation 05/22/2021-monoclonal lambda light chains -Bone marrow biopsy 05/23/2021-hypercellular marrow involved by plasma cell myeloma, 80-90% plasma cells on the bone marrow biopsy, diminished iron stains -[employer]-45 X, minus Y, t11:14 (11 q13) -Cycle 1 Velcade /Decadron  05/23/2021 (Decadron  given 05/22/2021) -Cycle 1 DRVd 06/13/2021, treatment held 06/20/2021 due to a rash, Revlimid  resumed 06/28/2021, daratumumab  resumed 07/04/2021 Revlimid  resumed 06/28/2021 Cycle 2 daratumumab /Revlimid /Decadron /Velcade  07/18/2021 (Revlimid  started 07/19/2021, placed on hold 07/25/2021 secondary to a rash-last taken  07/24/2021) Treatment continued with weekly Velcade /Decadron  and daratumumab  Treatment changed to pomalidomide , daratumumab , Decadron  10/03/2021 Pomalidomide  placed on hold 10/17/2021 due to a rash Pomalidomide  resumed at a reduced dose of 2 mg daily for 21 days followed by 7-day break 10/31/2021 Pomalidomide  2 mg daily for 21 days beginning 11/28/2021 Lambda light chains increased 12/12/2021 Venetoclax /Carfilzomib /Decadron  12/26/2021, venetoclax  started 01/05/2022 Venetoclax  placed on hold 01/09/2022 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 CARFILZOMIB /Decadron  weekly x3 beginning 01/16/2022 Venetoclax  resumed 01/19/2022 100 mg daily Carfilzomib /Decadron  weekly x 3 beginning 02/13/2022 (day 15 not given) Carfilzomib  /Decadron  weekly x 3 beginning 03/06/2022 Carfilzomib /Decadron  weekly x 3 beginning 04/04/2022 Carfilzomib /Decadron  weekly x 3 beginning 05/09/2022 Carfilzomib /Decadron  weekly x 3 beginning 06/07/2022 Last carfilzomib  06/20/2022 PET 06/29/2022-no evidence of active myeloma healing right anterior fourth rib fracture Bone marrow biopsy 07/06/2022-mildly hypocellular bone marrow (20%), no increase in plasma cells, MRD negative Normal kappa and lambda light chains 08/03/2022 Carfilzomib  08/22/2022 10/05/2022-melphalan 10/06/2022-stem cell infusion Bone marrow biopsy 01/16/2023-mildly hypocellular marrow, no increase in plasma cells, MRD -46 XY 01/16/2023-serum IFE-polyclonal, normal free light chains 01/16/2023: PET-no definite FDG avid lesions, uptake at L3 compression deformity or focal muscular uptake in the right calf-physiologic? 03/08/2023: Venetoclax  100 mg daily 10/09/2023 no M spike, light chains normal   Renal failure secondary to #1-improved Hypercalcemia-status post treat with calcitonin, pamidronate  05/22/2021-resolved Lytic bone lesions Anemia Thrombocytopenia Hypocalcemia-IV calcium  gluconate 05/30/2021, resolved Rash 06/20/2021,  consistent with a drug rash, recurrent rash 07/24/2021-Revlimid   placed on hold; recurrent rash 10/17/2021 Admission 01/09/2022 with renal failure, elevated uric acid, rasburicase  01/09/2022 Wheezing/cough-likely secondary to COPD and asthma      Disposition: Jordan King appears stable.  He continues venetoclax  maintenance.  He had  myeloma labs completed at [facility] 10/09/2023.  There was no M spike and light chains were normal.  Aside from nausea he is tolerating venetoclax  well.  The nausea is relieved with a single dose of Compazine .  CBC and chemistry panel reviewed.  Labs adequate to continue venetoclax .  He will return for follow-up in 6 weeks.  We are available to see him sooner if needed.    Olam Ned ANP/GNP-BC   11/07/2023  2:10 PM

## 2023-11-08 ENCOUNTER — Other Ambulatory Visit: Payer: Self-pay

## 2023-11-08 LAB — KAPPA/LAMBDA LIGHT CHAINS
Kappa free light chain: 10.3 mg/L (ref 3.3–19.4)
Kappa, lambda light chain ratio: 1.24 (ref 0.26–1.65)
Lambda free light chains: 8.3 mg/L (ref 5.7–26.3)

## 2023-11-10 LAB — MULTIPLE MYELOMA PANEL, SERUM
Albumin SerPl Elph-Mcnc: 3.7 g/dL (ref 2.9–4.4)
Albumin/Glob SerPl: 1.4 (ref 0.7–1.7)
Alpha 1: 0.2 g/dL (ref 0.0–0.4)
Alpha2 Glob SerPl Elph-Mcnc: 1.1 g/dL — ABNORMAL HIGH (ref 0.4–1.0)
B-Globulin SerPl Elph-Mcnc: 1 g/dL (ref 0.7–1.3)
Gamma Glob SerPl Elph-Mcnc: 0.5 g/dL (ref 0.4–1.8)
Globulin, Total: 2.8 g/dL (ref 2.2–3.9)
IgA: 85 mg/dL — ABNORMAL LOW (ref 90–386)
IgG (Immunoglobin G), Serum: 693 mg/dL (ref 603–1613)
IgM (Immunoglobulin M), Srm: 33 mg/dL (ref 20–172)
Total Protein ELP: 6.5 g/dL (ref 6.0–8.5)

## 2023-12-06 ENCOUNTER — Other Ambulatory Visit: Payer: Self-pay

## 2023-12-12 ENCOUNTER — Other Ambulatory Visit: Payer: Self-pay | Admitting: Nurse Practitioner

## 2023-12-12 ENCOUNTER — Other Ambulatory Visit: Payer: Self-pay | Admitting: Oncology

## 2023-12-12 ENCOUNTER — Encounter: Payer: Self-pay | Admitting: Oncology

## 2023-12-12 DIAGNOSIS — C9000 Multiple myeloma not having achieved remission: Secondary | ICD-10-CM

## 2023-12-12 MED ORDER — OXYCODONE-ACETAMINOPHEN 5-325 MG PO TABS: 1.0000 | ORAL_TABLET | Freq: Four times a day (QID) | ORAL | 0 refills | Status: DC | PRN

## 2023-12-18 ENCOUNTER — Other Ambulatory Visit: Payer: Self-pay | Admitting: *Deleted

## 2023-12-18 DIAGNOSIS — C9000 Multiple myeloma not having achieved remission: Secondary | ICD-10-CM

## 2023-12-19 ENCOUNTER — Inpatient Hospital Stay (HOSPITAL_BASED_OUTPATIENT_CLINIC_OR_DEPARTMENT_OTHER): Payer: Medicare (Managed Care) | Admitting: Oncology

## 2023-12-19 ENCOUNTER — Other Ambulatory Visit: Payer: Self-pay | Admitting: *Deleted

## 2023-12-19 ENCOUNTER — Inpatient Hospital Stay: Payer: Medicare (Managed Care) | Attending: Oncology

## 2023-12-19 VITALS — BP 113/74 | HR 88 | Temp 98.2°F | Resp 18 | Ht 68.0 in | Wt 172.6 lb

## 2023-12-19 DIAGNOSIS — C9000 Multiple myeloma not having achieved remission: Secondary | ICD-10-CM

## 2023-12-19 DIAGNOSIS — C9001 Multiple myeloma in remission: Secondary | ICD-10-CM | POA: Diagnosis present

## 2023-12-19 LAB — CMP (CANCER CENTER ONLY)
ALT: 16 U/L (ref 0–44)
AST: 15 U/L (ref 15–41)
Albumin: 4.2 g/dL (ref 3.5–5.0)
Alkaline Phosphatase: 85 U/L (ref 38–126)
Anion gap: 12 (ref 5–15)
BUN: 13 mg/dL (ref 6–20)
CO2: 25 mmol/L (ref 22–32)
Calcium: 10.1 mg/dL (ref 8.9–10.3)
Chloride: 101 mmol/L (ref 98–111)
Creatinine: 1.09 mg/dL (ref 0.61–1.24)
GFR, Estimated: >60 mL/min (ref >60)
Glucose, Bld: 113 mg/dL — ABNORMAL HIGH (ref 70–99)
Potassium: 4.1 mmol/L (ref 3.5–5.1)
Sodium: 138 mmol/L (ref 135–145)
Total Bilirubin: 0.3 mg/dL (ref 0.0–1.2)
Total Protein: 7.2 g/dL (ref 6.5–8.1)

## 2023-12-19 LAB — CBC WITH DIFFERENTIAL (CANCER CENTER ONLY)
Abs Immature Granulocytes: 0.03 K/uL (ref 0.00–0.07)
Basophils Absolute: 0.1 K/uL (ref 0.0–0.1)
Basophils Relative: 1 %
Eosinophils Absolute: 0.2 K/uL (ref 0.0–0.5)
Eosinophils Relative: 2 %
HCT: 45.9 % (ref 39.0–52.0)
Hemoglobin: 16.1 g/dL (ref 13.0–17.0)
Immature Granulocytes: 0 %
Lymphocytes Relative: 12 %
Lymphs Abs: 1.3 K/uL (ref 0.7–4.0)
MCH: 34 pg (ref 26.0–34.0)
MCHC: 35.1 g/dL (ref 30.0–36.0)
MCV: 97 fL (ref 80.0–100.0)
Monocytes Absolute: 0.8 K/uL (ref 0.1–1.0)
Monocytes Relative: 8 %
Neutro Abs: 8 K/uL — ABNORMAL HIGH (ref 1.7–7.7)
Neutrophils Relative %: 77 %
Platelet Count: 159 K/uL (ref 150–400)
RBC: 4.73 MIL/uL (ref 4.22–5.81)
RDW: 13.7 % (ref 11.5–15.5)
WBC Count: 10.4 K/uL (ref 4.0–10.5)
nRBC: 0 % (ref 0.0–0.2)

## 2023-12-19 MED ORDER — PROCHLORPERAZINE MALEATE 10 MG PO TABS: ORAL_TABLET | ORAL | 2 refills | Status: DC

## 2023-12-19 MED ORDER — VENETOCLAX 100 MG PO TABS: 100.0000 mg | ORAL_TABLET | Freq: Every day | ORAL | 2 refills | Status: DC

## 2023-12-19 NOTE — Progress Notes (Signed)
 [facility] OFFICE PROGRESS NOTE   Diagnosis: Multiple myeloma  INTERVAL HISTORY:   Jordan King returns as scheduled.  He continues venetoclax .  He takes approximately 1 oxycodone  per day for back pain.  He takes Compazine  to prevent nausea from venetoclax .  He continues smoking.  Objective:  Vital signs in last 24 hours:  Blood pressure 113/74, pulse 88, temperature 98.2 F (36.8 C), temperature source Temporal, resp. rate 18, height 5' 8 (1.727 m), weight 172 lb 9.6 oz (78.3 kg), SpO2 98%.    HEENT: No thrush or ulcer Resp: Lungs clear bilaterally Cardio: Regular rate and rhythm GI: No hepatosplenomegaly, umbilical hernia Vascular: No leg edema  Lab Results:  Lab Results  Component Value Date   WBC 10.4 12/19/2023   HGB 16.1 12/19/2023   HCT 45.9 12/19/2023   MCV 97.0 12/19/2023   PLT 159 12/19/2023   NEUTROABS 8.0 (H) 12/19/2023    CMP  Lab Results  Component Value Date   NA 140 11/07/2023   K 4.1 11/07/2023   CL 103 11/07/2023   CO2 21 (L) 11/07/2023   GLUCOSE 103 (H) 11/07/2023   BUN 16 11/07/2023   CREATININE 1.13 11/07/2023   CALCIUM  10.1 11/07/2023   PROT 7.3 11/07/2023   ALBUMIN 4.3 11/07/2023   AST 16 11/07/2023   ALT 15 11/07/2023   ALKPHOS 89 11/07/2023   BILITOT 0.3 11/07/2023   GFRNONAA >60 11/07/2023    No results found for: CEA1, CEA, CAN199, CA125  Lab Results  Component Value Date   INR 1.1 05/22/2021   LABPROT 14.4 05/22/2021    Imaging:  No results found.  Medications: I have reviewed the patient's current medications.   Assessment/Plan: Multiple myeloma -05/22/2021-kappa free light chain 11.3, lambda free light chain 17,028 -25 g proteinuria, 20 g lambda light chains -Serum immunofixation 05/22/2021-monoclonal lambda light chains -Bone marrow biopsy 05/23/2021-hypercellular marrow involved by plasma cell myeloma, 80-90% plasma cells on the bone marrow biopsy, diminished iron stains -[employer]yt[employer]-45 X,  minus Y, t11:14 (11 q13) -Cycle 1 Velcade /Decadron  05/23/2021 (Decadron  given 05/22/2021) -Cycle 1 DRVd 06/13/2021, treatment held 06/20/2021 due to a rash, Revlimid  resumed 06/28/2021, daratumumab  resumed 07/04/2021 Revlimid  resumed 06/28/2021 Cycle 2 daratumumab /Revlimid /Decadron /Velcade  07/18/2021 (Revlimid  started 07/19/2021, placed on hold 07/25/2021 secondary to a rash-last taken 07/24/2021) Treatment continued with weekly Velcade /Decadron  and daratumumab  Treatment changed to pomalidomide , daratumumab , Decadron  10/03/2021 Pomalidomide  placed on hold 10/17/2021 due to a rash Pomalidomide  resumed at a reduced dose of 2 mg daily for 21 days followed by 7-day break 10/31/2021 Pomalidomide  2 mg daily for 21 days beginning 11/28/2021 Lambda light chains increased 12/12/2021 Venetoclax /Carfilzomib /Decadron  12/26/2021, venetoclax  started 01/05/2022 Venetoclax  placed on hold 01/09/2022 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 CARFILZOMIB /Decadron  weekly x3 beginning 01/16/2022 Venetoclax  resumed 01/19/2022 100 mg daily Carfilzomib /Decadron  weekly x 3 beginning 02/13/2022 (day 15 not given) Carfilzomib  /Decadron  weekly x 3 beginning 03/06/2022 Carfilzomib /Decadron  weekly x 3 beginning 04/04/2022 Carfilzomib /Decadron  weekly x 3 beginning 05/09/2022 Carfilzomib /Decadron  weekly x 3 beginning 06/07/2022 Last carfilzomib  06/20/2022 PET 06/29/2022-no evidence of active myeloma healing right anterior fourth rib fracture Bone marrow biopsy 07/06/2022-mildly hypocellular bone marrow (20%), no increase in plasma cells, MRD negative Normal kappa and lambda light chains 08/03/2022 Carfilzomib  08/22/2022 10/05/2022-melphalan 10/06/2022-stem cell infusion Bone marrow biopsy 01/16/2023-mildly hypocellular marrow, no increase in plasma cells, MRD -46 XY 01/16/2023-serum IFE-polyclonal, normal free light chains 01/16/2023: PET-no definite FDG avid lesions, uptake at L3 compression deformity or focal muscular uptake in the right  calf-physiologic? 03/08/2023: Venetoclax  100 mg daily 10/09/2023 no M spike,  light chains normal 11/07/2023, no M spike, negative immunofixation, light chains normal   Renal failure secondary to #1-improved Hypercalcemia-status post treat with calcitonin, pamidronate  05/22/2021-resolved Lytic bone lesions Anemia Thrombocytopenia Hypocalcemia-IV calcium  gluconate 05/30/2021, resolved Rash 06/20/2021,  consistent with a drug rash, recurrent rash 07/24/2021-Revlimid  placed on hold; recurrent rash 10/17/2021 Admission 01/09/2022 with renal failure, elevated uric acid, rasburicase  01/09/2022 Wheezing/cough-likely secondary to COPD and asthma       Disposition: Jordan King is in clinical remission from multiple myeloma.  He continues venetoclax  maintenance.  He is tolerating the venetoclax  well.  He will return for an office and lab visit in 6-8 weeks.  He declines an influenza vaccine.  He declines Zometa  until he completes dental work.  Arley Hof, MD  12/19/2023  11:20 AM

## 2023-12-21 ENCOUNTER — Other Ambulatory Visit: Payer: Self-pay

## 2024-01-10 ENCOUNTER — Encounter: Payer: Self-pay | Admitting: Oncology

## 2024-01-15 ENCOUNTER — Other Ambulatory Visit: Payer: Self-pay | Admitting: *Deleted

## 2024-01-15 ENCOUNTER — Other Ambulatory Visit: Payer: Self-pay | Admitting: Nurse Practitioner

## 2024-01-15 ENCOUNTER — Encounter: Payer: Self-pay | Admitting: Oncology

## 2024-01-15 DIAGNOSIS — C9000 Multiple myeloma not having achieved remission: Secondary | ICD-10-CM

## 2024-01-15 MED ORDER — OXYCODONE-ACETAMINOPHEN 5-325 MG PO TABS: 1.0000 | ORAL_TABLET | Freq: Four times a day (QID) | ORAL | 0 refills | Status: DC | PRN

## 2024-01-15 NOTE — Telephone Encounter (Signed)
 Jordan King sent MyChart message requesting refill on his oxycodone .

## 2024-02-01 ENCOUNTER — Other Ambulatory Visit: Payer: Self-pay | Admitting: Oncology

## 2024-02-01 DIAGNOSIS — C9000 Multiple myeloma not having achieved remission: Secondary | ICD-10-CM

## 2024-02-12 ENCOUNTER — Inpatient Hospital Stay: Payer: Medicare (Managed Care) | Attending: Oncology

## 2024-02-12 ENCOUNTER — Inpatient Hospital Stay: Payer: Medicare (Managed Care) | Admitting: Oncology

## 2024-02-12 VITALS — BP 96/80 | HR 94 | Temp 97.9°F | Resp 18 | Ht 68.0 in | Wt 175.0 lb

## 2024-02-12 DIAGNOSIS — C9000 Multiple myeloma not having achieved remission: Secondary | ICD-10-CM | POA: Diagnosis present

## 2024-02-12 LAB — CBC WITH DIFFERENTIAL (CANCER CENTER ONLY)
Abs Immature Granulocytes: 0.04 K/uL (ref 0.00–0.07)
Basophils Absolute: 0.1 K/uL (ref 0.0–0.1)
Basophils Relative: 0 %
Eosinophils Absolute: 0.2 K/uL (ref 0.0–0.5)
Eosinophils Relative: 1 %
HCT: 47.9 % (ref 39.0–52.0)
Hemoglobin: 17.1 g/dL — ABNORMAL HIGH (ref 13.0–17.0)
Immature Granulocytes: 0 %
Lymphocytes Relative: 10 %
Lymphs Abs: 1.2 K/uL (ref 0.7–4.0)
MCH: 34.3 pg — ABNORMAL HIGH (ref 26.0–34.0)
MCHC: 35.7 g/dL (ref 30.0–36.0)
MCV: 96.2 fL (ref 80.0–100.0)
Monocytes Absolute: 1 K/uL (ref 0.1–1.0)
Monocytes Relative: 8 %
Neutro Abs: 9.4 K/uL — ABNORMAL HIGH (ref 1.7–7.7)
Neutrophils Relative %: 81 %
Platelet Count: 172 K/uL (ref 150–400)
RBC: 4.98 MIL/uL (ref 4.22–5.81)
RDW: 14.3 % (ref 11.5–15.5)
WBC Count: 11.8 K/uL — ABNORMAL HIGH (ref 4.0–10.5)
nRBC: 0 % (ref 0.0–0.2)

## 2024-02-12 LAB — CMP (CANCER CENTER ONLY)
ALT: 21 U/L (ref 0–44)
AST: 18 U/L (ref 15–41)
Albumin: 4.3 g/dL (ref 3.5–5.0)
Alkaline Phosphatase: 91 U/L (ref 38–126)
Anion gap: 13 (ref 5–15)
BUN: 13 mg/dL (ref 6–20)
CO2: 25 mmol/L (ref 22–32)
Calcium: 10.2 mg/dL (ref 8.9–10.3)
Chloride: 101 mmol/L (ref 98–111)
Creatinine: 1.01 mg/dL (ref 0.61–1.24)
GFR, Estimated: >60 mL/min (ref >60)
Glucose, Bld: 125 mg/dL — ABNORMAL HIGH (ref 70–99)
Potassium: 3.9 mmol/L (ref 3.5–5.1)
Sodium: 138 mmol/L (ref 135–145)
Total Bilirubin: 0.3 mg/dL (ref 0.0–1.2)
Total Protein: 7.5 g/dL (ref 6.5–8.1)

## 2024-02-12 MED ORDER — OXYCODONE-ACETAMINOPHEN 5-325 MG PO TABS: 1.0000 | ORAL_TABLET | Freq: Every day | ORAL | 0 refills | Status: DC | PRN

## 2024-02-12 NOTE — Progress Notes (Signed)
 Jordan King   Diagnosis: Multiple myeloma  INTERVAL HISTORY:   Jordan King returns as scheduled.  He continues venetoclax .  He takes Compazine  as needed for nausea.  He reports an episode of severe nausea 1 night after he took a chipped venetoclax  pill.  No recent infection.  He has chronic low back pain.  He takes oxycodone  approximately once daily.  He would like to increase the dose of the oxycodone .  Objective:  Vital signs in last 24 hours:  Blood pressure 96/80, pulse 94, temperature 97.9 F (36.6 C), temperature source Temporal, resp. rate 18, height 5' 8 (1.727 m), weight 175 lb (79.4 kg), SpO2 98%.    HEENT: No thrush or ulcers, mild white coat over the tongue Resp: Lungs clear bilaterally Cardio: Regular rate and rhythm GI: No hepatosplenomegaly, soft umbilical hernia Vascular: No leg edema   Lab Results:  Lab Results  Component Value Date   WBC 11.8 (H) 02/12/2024   HGB 17.1 (H) 02/12/2024   HCT 47.9 02/12/2024   MCV 96.2 02/12/2024   PLT 172 02/12/2024   NEUTROABS 9.4 (H) 02/12/2024    CMP  Lab Results  Component Value Date   NA 138 02/12/2024   K 3.9 02/12/2024   CL 101 02/12/2024   CO2 25 02/12/2024   GLUCOSE 125 (H) 02/12/2024   BUN 13 02/12/2024   CREATININE 1.01 02/12/2024   CALCIUM  10.2 02/12/2024   PROT 7.5 02/12/2024   ALBUMIN 4.3 02/12/2024   AST 18 02/12/2024   ALT 21 02/12/2024   ALKPHOS 91 02/12/2024   BILITOT 0.3 02/12/2024   GFRNONAA >60 02/12/2024    No results found for: CEA1, CEA, CAN199, CA125  Lab Results  Component Value Date   INR 1.1 05/22/2021   LABPROT 14.4 05/22/2021    Imaging:  No results found.  Medications: I have reviewed the patient's current medications.   Assessment/Plan: Multiple myeloma -05/22/2021-kappa free light chain 11.3, lambda free light chain 17,028 -25 g proteinuria, 20 g lambda light chains -Serum immunofixation 05/22/2021-monoclonal lambda  light chains -Bone marrow biopsy 05/23/2021-hypercellular marrow involved by plasma cell myeloma, 80-90% plasma cells on the bone marrow biopsy, diminished iron stains -Cytogenetics-45 X, minus Y, t11:14 (11 q13) -Cycle 1 Velcade /Decadron  05/23/2021 (Decadron  given 05/22/2021) -Cycle 1 DRVd 06/13/2021, treatment held 06/20/2021 due to a rash, Revlimid  resumed 06/28/2021, daratumumab  resumed 07/04/2021 Revlimid  resumed 06/28/2021 Cycle 2 daratumumab /Revlimid /Decadron /Velcade  07/18/2021 (Revlimid  started 07/19/2021, placed on hold 07/25/2021 secondary to a rash-last taken 07/24/2021) Treatment continued with weekly Velcade /Decadron  and daratumumab  Treatment changed to pomalidomide , daratumumab , Decadron  10/03/2021 Pomalidomide  placed on hold 10/17/2021 due to a rash Pomalidomide  resumed at a reduced dose of 2 mg daily for 21 days followed by 7-day break 10/31/2021 Pomalidomide  2 mg daily for 21 days beginning 11/28/2021 Lambda light chains increased 12/12/2021 Venetoclax /Carfilzomib /Decadron  12/26/2021, venetoclax  started 01/05/2022 Venetoclax  placed on hold 01/09/2022 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 CARFILZOMIB /Decadron  weekly x3 beginning 01/16/2022 Venetoclax  resumed 01/19/2022 100 mg daily Carfilzomib /Decadron  weekly x 3 beginning 02/13/2022 (day 15 not given) Carfilzomib  /Decadron  weekly x 3 beginning 03/06/2022 Carfilzomib /Decadron  weekly x 3 beginning 04/04/2022 Carfilzomib /Decadron  weekly x 3 beginning 05/09/2022 Carfilzomib /Decadron  weekly x 3 beginning 06/07/2022 Last carfilzomib  06/20/2022 PET 06/29/2022-no evidence of active myeloma healing right anterior fourth rib fracture Bone marrow biopsy 07/06/2022-mildly hypocellular bone marrow (20%), no increase in plasma cells, MRD negative Normal kappa and lambda light chains 08/03/2022 Carfilzomib  08/22/2022 10/05/2022-melphalan 10/06/2022-stem cell infusion Bone marrow biopsy 01/16/2023-mildly hypocellular marrow, no increase in plasma cells, MRD -  46  XY 01/16/2023-serum IFE-polyclonal, normal free light chains 01/16/2023: PET-no definite FDG avid lesions, uptake at L3 compression deformity or focal muscular uptake in the right calf-physiologic? 03/08/2023: Venetoclax  100 mg daily 10/09/2023 no M spike, light chains normal 11/07/2023, no M spike, negative immunofixation, light chains normal   Renal failure secondary to #1-improved Hypercalcemia-status post treat with calcitonin, pamidronate  05/22/2021-resolved Lytic bone lesions Anemia Thrombocytopenia Hypocalcemia-IV calcium  gluconate 05/30/2021, resolved Rash 06/20/2021,  consistent with a drug rash, recurrent rash 07/24/2021-Revlimid  placed on hold; recurrent rash 10/17/2021 Admission 01/09/2022 with renal failure, elevated uric acid, rasburicase  01/09/2022 Wheezing/cough-likely secondary to COPD and asthma        Disposition: Jordan King is in clinical remission from multiple myeloma.  We will follow-up on the myeloma panel from today.  He declines Zometa .  He declines a flu vaccine.  He is scheduled for follow-up at [facility] in 2026.  He would like to wait on a follow-up visit here until after the [facility] appointment.  He will be scheduled for an office and lab visit during the week of 05/19/2024.  He will contact us  as needed.  I refilled his prescription for oxycodone .  He will increase the dose to 7.5 mg as needed.  Arley Hof, MD  02/12/2024  11:17 AM

## 2024-02-13 LAB — KAPPA/LAMBDA LIGHT CHAINS
Kappa free light chain: 9.9 mg/L (ref 3.3–19.4)
Kappa, lambda light chain ratio: 1.3 (ref 0.26–1.65)
Lambda free light chains: 7.6 mg/L (ref 5.7–26.3)

## 2024-02-14 ENCOUNTER — Other Ambulatory Visit: Payer: Self-pay

## 2024-02-15 LAB — MULTIPLE MYELOMA PANEL, SERUM
Albumin SerPl Elph-Mcnc: 3.3 g/dL (ref 2.9–4.4)
Albumin/Glob SerPl: 1.1 (ref 0.7–1.7)
Alpha 1: 0.3 g/dL (ref 0.0–0.4)
Alpha2 Glob SerPl Elph-Mcnc: 1.2 g/dL — ABNORMAL HIGH (ref 0.4–1.0)
B-Globulin SerPl Elph-Mcnc: 1.2 g/dL (ref 0.7–1.3)
Gamma Glob SerPl Elph-Mcnc: 0.6 g/dL (ref 0.4–1.8)
Globulin, Total: 3.3 g/dL (ref 2.2–3.9)
IgA: 104 mg/dL (ref 90–386)
IgG (Immunoglobin G), Serum: 710 mg/dL (ref 603–1613)
IgM (Immunoglobulin M), Srm: 27 mg/dL (ref 20–172)
Total Protein ELP: 6.6 g/dL (ref 6.0–8.5)

## 2024-03-08 ENCOUNTER — Other Ambulatory Visit: Payer: Self-pay

## 2024-03-23 ENCOUNTER — Other Ambulatory Visit: Payer: Self-pay | Admitting: Oncology

## 2024-03-24 ENCOUNTER — Encounter: Payer: Self-pay | Admitting: Oncology

## 2024-03-31 ENCOUNTER — Encounter: Payer: Self-pay | Admitting: Oncology

## 2024-04-01 ENCOUNTER — Other Ambulatory Visit: Payer: Self-pay | Admitting: Nurse Practitioner

## 2024-04-01 ENCOUNTER — Other Ambulatory Visit: Payer: Self-pay | Admitting: Oncology

## 2024-04-01 DIAGNOSIS — C9000 Multiple myeloma not having achieved remission: Secondary | ICD-10-CM

## 2024-04-01 MED ORDER — OXYCODONE-ACETAMINOPHEN 5-325 MG PO TABS: 1.0000 | ORAL_TABLET | Freq: Every day | ORAL | 0 refills | Status: AC | PRN

## 2024-04-01 MED ORDER — LORAZEPAM 0.5 MG PO TABS: ORAL_TABLET | ORAL | 0 refills | Status: AC

## 2024-04-03 ENCOUNTER — Other Ambulatory Visit: Payer: Self-pay | Admitting: Oncology

## 2024-04-03 DIAGNOSIS — C9000 Multiple myeloma not having achieved remission: Secondary | ICD-10-CM

## 2024-04-03 NOTE — Telephone Encounter (Signed)
 Duplicate refill request denying

## 2024-04-03 NOTE — Telephone Encounter (Signed)
 Verbal from Dr Cloretta

## 2024-04-09 ENCOUNTER — Other Ambulatory Visit: Payer: Self-pay | Admitting: Oncology

## 2024-04-09 DIAGNOSIS — C9000 Multiple myeloma not having achieved remission: Secondary | ICD-10-CM

## 2024-04-15 ENCOUNTER — Other Ambulatory Visit: Payer: Self-pay | Admitting: *Deleted

## 2024-04-15 MED ORDER — VENETOCLAX 100 MG PO TABS: 100.0000 mg | ORAL_TABLET | Freq: Every day | ORAL | 2 refills | Status: AC

## 2024-05-22 ENCOUNTER — Inpatient Hospital Stay

## 2024-05-22 ENCOUNTER — Inpatient Hospital Stay: Admitting: Oncology
